# Patient Record
Sex: Female | Born: 1945 | Race: Black or African American | Hispanic: No | Smoking: Former smoker
Health system: Southern US, Community
[De-identification: ages and names within clinical notes are randomized; demographics above are authoritative.]

## PROBLEM LIST (undated history)

## (undated) DIAGNOSIS — J219 Acute bronchiolitis, unspecified: Secondary | ICD-10-CM

## (undated) DIAGNOSIS — K219 Gastro-esophageal reflux disease without esophagitis: Secondary | ICD-10-CM

## (undated) DIAGNOSIS — M1990 Unspecified osteoarthritis, unspecified site: Secondary | ICD-10-CM

## (undated) DIAGNOSIS — F329 Major depressive disorder, single episode, unspecified: Secondary | ICD-10-CM

## (undated) DIAGNOSIS — G8929 Other chronic pain: Secondary | ICD-10-CM

## (undated) DIAGNOSIS — K589 Irritable bowel syndrome without diarrhea: Secondary | ICD-10-CM

## (undated) DIAGNOSIS — E7800 Pure hypercholesterolemia, unspecified: Secondary | ICD-10-CM

## (undated) DIAGNOSIS — F32A Depression, unspecified: Secondary | ICD-10-CM

## (undated) DIAGNOSIS — K429 Umbilical hernia without obstruction or gangrene: Secondary | ICD-10-CM

## (undated) DIAGNOSIS — K635 Polyp of colon: Secondary | ICD-10-CM

## (undated) DIAGNOSIS — Z973 Presence of spectacles and contact lenses: Secondary | ICD-10-CM

## (undated) DIAGNOSIS — M549 Dorsalgia, unspecified: Secondary | ICD-10-CM

## (undated) DIAGNOSIS — K5790 Diverticulosis of intestine, part unspecified, without perforation or abscess without bleeding: Secondary | ICD-10-CM

## (undated) DIAGNOSIS — I10 Essential (primary) hypertension: Secondary | ICD-10-CM

## (undated) DIAGNOSIS — F419 Anxiety disorder, unspecified: Secondary | ICD-10-CM

## (undated) DIAGNOSIS — D126 Benign neoplasm of colon, unspecified: Secondary | ICD-10-CM

## (undated) HISTORY — DX: Anxiety disorder, unspecified: F41.9

## (undated) HISTORY — PX: CATARACT EXTRACTION, BILATERAL: SHX1313

## (undated) HISTORY — DX: Gastro-esophageal reflux disease without esophagitis: K21.9

## (undated) HISTORY — DX: Essential (primary) hypertension: I10

## (undated) HISTORY — DX: Pure hypercholesterolemia, unspecified: E78.00

## (undated) HISTORY — DX: Umbilical hernia without obstruction or gangrene: K42.9

## (undated) HISTORY — DX: Major depressive disorder, single episode, unspecified: F32.9

## (undated) HISTORY — DX: Benign neoplasm of colon, unspecified: D12.6

## (undated) HISTORY — PX: ECTOPIC PREGNANCY SURGERY: SHX613

## (undated) HISTORY — DX: Other chronic pain: G89.29

## (undated) HISTORY — DX: Irritable bowel syndrome, unspecified: K58.9

## (undated) HISTORY — DX: Dorsalgia, unspecified: M54.9

## (undated) HISTORY — DX: Polyp of colon: K63.5

## (undated) HISTORY — DX: Diverticulosis of intestine, part unspecified, without perforation or abscess without bleeding: K57.90

## (undated) HISTORY — DX: Acute bronchiolitis, unspecified: J21.9

## (undated) HISTORY — DX: Depression, unspecified: F32.A

## (undated) HISTORY — PX: APPENDECTOMY: SHX54

---

## 1973-07-08 HISTORY — PX: NEPHRECTOMY: SHX65

## 1978-07-08 HISTORY — PX: CHOLECYSTECTOMY: SHX55

## 1983-07-09 HISTORY — PX: ABDOMINAL HYSTERECTOMY: SHX81

## 1987-07-09 HISTORY — PX: BREAST SURGERY: SHX581

## 2000-12-01 ENCOUNTER — Ambulatory Visit (HOSPITAL_COMMUNITY): Admission: RE | Admit: 2000-12-01 | Discharge: 2000-12-01 | Payer: Self-pay | Admitting: Family Medicine

## 2000-12-01 ENCOUNTER — Encounter: Payer: Self-pay | Admitting: Family Medicine

## 2000-12-29 ENCOUNTER — Ambulatory Visit (HOSPITAL_COMMUNITY): Admission: RE | Admit: 2000-12-29 | Discharge: 2000-12-29 | Payer: Self-pay | Admitting: Unknown Physician Specialty

## 2000-12-29 ENCOUNTER — Encounter: Payer: Self-pay | Admitting: Unknown Physician Specialty

## 2001-01-15 ENCOUNTER — Other Ambulatory Visit: Admission: RE | Admit: 2001-01-15 | Discharge: 2001-01-15 | Payer: Self-pay | Admitting: Family Medicine

## 2001-01-22 ENCOUNTER — Ambulatory Visit (HOSPITAL_COMMUNITY): Admission: RE | Admit: 2001-01-22 | Discharge: 2001-01-22 | Payer: Self-pay | Admitting: Family Medicine

## 2001-01-22 ENCOUNTER — Encounter: Payer: Self-pay | Admitting: Family Medicine

## 2001-08-04 ENCOUNTER — Encounter: Payer: Self-pay | Admitting: Family Medicine

## 2001-08-04 ENCOUNTER — Ambulatory Visit (HOSPITAL_COMMUNITY): Admission: RE | Admit: 2001-08-04 | Discharge: 2001-08-04 | Payer: Self-pay | Admitting: Family Medicine

## 2001-09-02 ENCOUNTER — Encounter: Payer: Self-pay | Admitting: Family Medicine

## 2001-09-02 ENCOUNTER — Ambulatory Visit (HOSPITAL_COMMUNITY): Admission: RE | Admit: 2001-09-02 | Discharge: 2001-09-02 | Payer: Self-pay | Admitting: Family Medicine

## 2002-01-20 ENCOUNTER — Ambulatory Visit (HOSPITAL_COMMUNITY): Admission: RE | Admit: 2002-01-20 | Discharge: 2002-01-20 | Payer: Self-pay | Admitting: Family Medicine

## 2002-01-20 ENCOUNTER — Encounter: Payer: Self-pay | Admitting: Family Medicine

## 2002-01-27 ENCOUNTER — Encounter: Payer: Self-pay | Admitting: Family Medicine

## 2002-01-27 ENCOUNTER — Ambulatory Visit (HOSPITAL_COMMUNITY): Admission: RE | Admit: 2002-01-27 | Discharge: 2002-01-27 | Payer: Self-pay | Admitting: Family Medicine

## 2002-04-16 ENCOUNTER — Ambulatory Visit (HOSPITAL_COMMUNITY): Admission: RE | Admit: 2002-04-16 | Discharge: 2002-04-16 | Payer: Self-pay | Admitting: Obstetrics and Gynecology

## 2002-04-16 ENCOUNTER — Encounter: Payer: Self-pay | Admitting: Obstetrics and Gynecology

## 2002-09-16 ENCOUNTER — Encounter: Payer: Self-pay | Admitting: Family Medicine

## 2002-09-16 ENCOUNTER — Ambulatory Visit (HOSPITAL_COMMUNITY): Admission: RE | Admit: 2002-09-16 | Discharge: 2002-09-16 | Payer: Self-pay | Admitting: Family Medicine

## 2003-06-07 ENCOUNTER — Ambulatory Visit (HOSPITAL_COMMUNITY): Admission: RE | Admit: 2003-06-07 | Discharge: 2003-06-07 | Payer: Self-pay | Admitting: Family Medicine

## 2003-07-05 ENCOUNTER — Ambulatory Visit (HOSPITAL_COMMUNITY): Admission: RE | Admit: 2003-07-05 | Discharge: 2003-07-05 | Payer: Self-pay | Admitting: Family Medicine

## 2004-03-16 ENCOUNTER — Ambulatory Visit (HOSPITAL_COMMUNITY): Admission: RE | Admit: 2004-03-16 | Discharge: 2004-03-16 | Payer: Self-pay | Admitting: Family Medicine

## 2004-08-02 ENCOUNTER — Ambulatory Visit (HOSPITAL_COMMUNITY): Admission: RE | Admit: 2004-08-02 | Discharge: 2004-08-02 | Payer: Self-pay | Admitting: Family Medicine

## 2004-10-23 ENCOUNTER — Ambulatory Visit (HOSPITAL_COMMUNITY): Admission: RE | Admit: 2004-10-23 | Discharge: 2004-10-23 | Payer: Self-pay | Admitting: Neurosurgery

## 2004-11-13 ENCOUNTER — Ambulatory Visit (HOSPITAL_COMMUNITY): Admission: RE | Admit: 2004-11-13 | Discharge: 2004-11-14 | Payer: Self-pay | Admitting: Neurosurgery

## 2004-12-05 ENCOUNTER — Encounter: Admission: RE | Admit: 2004-12-05 | Discharge: 2004-12-05 | Payer: Self-pay | Admitting: Neurosurgery

## 2005-02-28 ENCOUNTER — Ambulatory Visit (HOSPITAL_COMMUNITY): Admission: RE | Admit: 2005-02-28 | Discharge: 2005-02-28 | Payer: Self-pay | Admitting: Family Medicine

## 2005-03-15 ENCOUNTER — Ambulatory Visit: Payer: Self-pay | Admitting: *Deleted

## 2005-03-27 ENCOUNTER — Ambulatory Visit: Payer: Self-pay | Admitting: *Deleted

## 2005-03-27 ENCOUNTER — Encounter (HOSPITAL_COMMUNITY): Admission: RE | Admit: 2005-03-27 | Discharge: 2005-03-27 | Payer: Self-pay | Admitting: *Deleted

## 2005-04-02 ENCOUNTER — Ambulatory Visit: Payer: Self-pay | Admitting: *Deleted

## 2005-06-25 ENCOUNTER — Ambulatory Visit (HOSPITAL_COMMUNITY): Admission: RE | Admit: 2005-06-25 | Discharge: 2005-06-25 | Payer: Self-pay | Admitting: Family Medicine

## 2005-07-03 ENCOUNTER — Ambulatory Visit (HOSPITAL_COMMUNITY): Admission: RE | Admit: 2005-07-03 | Discharge: 2005-07-03 | Payer: Self-pay | Admitting: Family Medicine

## 2005-07-08 HISTORY — PX: CERVICAL DISC SURGERY: SHX588

## 2005-08-05 ENCOUNTER — Ambulatory Visit (HOSPITAL_COMMUNITY): Admission: RE | Admit: 2005-08-05 | Discharge: 2005-08-05 | Payer: Self-pay | Admitting: Internal Medicine

## 2005-08-05 ENCOUNTER — Encounter: Payer: Self-pay | Admitting: Internal Medicine

## 2005-08-05 ENCOUNTER — Ambulatory Visit: Payer: Self-pay | Admitting: Internal Medicine

## 2005-08-05 HISTORY — PX: COLONOSCOPY: SHX174

## 2005-09-09 ENCOUNTER — Ambulatory Visit: Payer: Self-pay | Admitting: Internal Medicine

## 2005-09-12 ENCOUNTER — Ambulatory Visit (HOSPITAL_COMMUNITY): Admission: RE | Admit: 2005-09-12 | Discharge: 2005-09-12 | Payer: Self-pay | Admitting: Internal Medicine

## 2005-10-07 ENCOUNTER — Ambulatory Visit (HOSPITAL_COMMUNITY): Admission: RE | Admit: 2005-10-07 | Discharge: 2005-10-07 | Payer: Self-pay | Admitting: Family Medicine

## 2005-10-29 ENCOUNTER — Ambulatory Visit: Payer: Self-pay | Admitting: *Deleted

## 2005-11-07 ENCOUNTER — Emergency Department (HOSPITAL_COMMUNITY): Admission: EM | Admit: 2005-11-07 | Discharge: 2005-11-07 | Payer: Self-pay | Admitting: Emergency Medicine

## 2005-11-13 ENCOUNTER — Ambulatory Visit: Payer: Self-pay | Admitting: *Deleted

## 2005-11-27 ENCOUNTER — Ambulatory Visit: Payer: Self-pay | Admitting: Internal Medicine

## 2006-01-04 ENCOUNTER — Encounter: Admission: RE | Admit: 2006-01-04 | Discharge: 2006-01-04 | Payer: Self-pay | Admitting: Neurosurgery

## 2006-01-06 ENCOUNTER — Ambulatory Visit: Payer: Self-pay | Admitting: Internal Medicine

## 2006-01-09 ENCOUNTER — Ambulatory Visit: Payer: Self-pay | Admitting: Internal Medicine

## 2006-02-25 ENCOUNTER — Ambulatory Visit: Payer: Self-pay | Admitting: Internal Medicine

## 2006-02-26 ENCOUNTER — Ambulatory Visit (HOSPITAL_COMMUNITY): Admission: RE | Admit: 2006-02-26 | Discharge: 2006-02-26 | Payer: Self-pay | Admitting: Family Medicine

## 2006-03-04 ENCOUNTER — Ambulatory Visit: Payer: Self-pay | Admitting: *Deleted

## 2006-03-06 ENCOUNTER — Emergency Department (HOSPITAL_COMMUNITY): Admission: EM | Admit: 2006-03-06 | Discharge: 2006-03-06 | Payer: Self-pay | Admitting: Emergency Medicine

## 2006-03-27 ENCOUNTER — Ambulatory Visit: Payer: Self-pay | Admitting: Internal Medicine

## 2006-04-10 ENCOUNTER — Ambulatory Visit: Payer: Self-pay | Admitting: Internal Medicine

## 2006-05-09 ENCOUNTER — Ambulatory Visit: Admission: RE | Admit: 2006-05-09 | Discharge: 2006-05-09 | Payer: Self-pay | Admitting: Otolaryngology

## 2006-05-15 ENCOUNTER — Ambulatory Visit: Payer: Self-pay | Admitting: Pulmonary Disease

## 2006-07-22 ENCOUNTER — Ambulatory Visit: Payer: Self-pay | Admitting: Internal Medicine

## 2006-10-02 ENCOUNTER — Ambulatory Visit (HOSPITAL_COMMUNITY): Admission: RE | Admit: 2006-10-02 | Discharge: 2006-10-02 | Payer: Self-pay | Admitting: Family Medicine

## 2006-10-03 ENCOUNTER — Ambulatory Visit: Payer: Self-pay | Admitting: Gastroenterology

## 2006-10-15 ENCOUNTER — Ambulatory Visit (HOSPITAL_COMMUNITY): Admission: RE | Admit: 2006-10-15 | Discharge: 2006-10-15 | Payer: Self-pay | Admitting: Family Medicine

## 2007-03-05 ENCOUNTER — Ambulatory Visit (HOSPITAL_COMMUNITY): Admission: RE | Admit: 2007-03-05 | Discharge: 2007-03-05 | Payer: Self-pay | Admitting: Family Medicine

## 2007-04-09 ENCOUNTER — Ambulatory Visit: Payer: Self-pay | Admitting: Gastroenterology

## 2007-10-22 ENCOUNTER — Ambulatory Visit: Payer: Self-pay | Admitting: Cardiology

## 2007-10-22 ENCOUNTER — Ambulatory Visit (HOSPITAL_COMMUNITY): Admission: RE | Admit: 2007-10-22 | Discharge: 2007-10-22 | Payer: Self-pay | Admitting: Family Medicine

## 2007-10-22 ENCOUNTER — Encounter (INDEPENDENT_AMBULATORY_CARE_PROVIDER_SITE_OTHER): Payer: Self-pay | Admitting: Family Medicine

## 2007-12-08 ENCOUNTER — Telehealth (INDEPENDENT_AMBULATORY_CARE_PROVIDER_SITE_OTHER): Payer: Self-pay | Admitting: *Deleted

## 2007-12-08 ENCOUNTER — Ambulatory Visit: Payer: Self-pay | Admitting: Cardiology

## 2008-04-01 ENCOUNTER — Ambulatory Visit: Payer: Self-pay | Admitting: Internal Medicine

## 2008-05-11 ENCOUNTER — Ambulatory Visit: Payer: Self-pay | Admitting: Internal Medicine

## 2008-05-16 ENCOUNTER — Ambulatory Visit (HOSPITAL_COMMUNITY): Admission: RE | Admit: 2008-05-16 | Discharge: 2008-05-16 | Payer: Self-pay | Admitting: Family Medicine

## 2008-07-12 ENCOUNTER — Ambulatory Visit: Payer: Self-pay | Admitting: Internal Medicine

## 2008-07-14 ENCOUNTER — Encounter: Payer: Self-pay | Admitting: Internal Medicine

## 2008-07-14 LAB — CONVERTED CEMR LAB
ALT: 11 units/L (ref 0–35)
AST: 14 units/L (ref 0–37)
Albumin: 4.4 g/dL (ref 3.5–5.2)
Alkaline Phosphatase: 98 units/L (ref 39–117)
Bilirubin, Direct: 0.2 mg/dL (ref 0.0–0.3)
Creatinine, Ser: 0.96 mg/dL (ref 0.40–1.20)
Indirect Bilirubin: 0.8 mg/dL (ref 0.0–0.9)
Total Bilirubin: 1 mg/dL (ref 0.3–1.2)
Total Protein: 7.7 g/dL (ref 6.0–8.3)

## 2008-07-15 ENCOUNTER — Ambulatory Visit (HOSPITAL_COMMUNITY): Admission: RE | Admit: 2008-07-15 | Discharge: 2008-07-15 | Payer: Self-pay | Admitting: Internal Medicine

## 2008-09-05 ENCOUNTER — Ambulatory Visit (HOSPITAL_COMMUNITY): Admission: RE | Admit: 2008-09-05 | Discharge: 2008-09-05 | Payer: Self-pay | Admitting: Family Medicine

## 2008-10-13 ENCOUNTER — Telehealth: Payer: Self-pay | Admitting: Gastroenterology

## 2008-10-27 DIAGNOSIS — R109 Unspecified abdominal pain: Secondary | ICD-10-CM | POA: Insufficient documentation

## 2008-10-27 DIAGNOSIS — K589 Irritable bowel syndrome without diarrhea: Secondary | ICD-10-CM | POA: Insufficient documentation

## 2008-10-27 DIAGNOSIS — R197 Diarrhea, unspecified: Secondary | ICD-10-CM | POA: Insufficient documentation

## 2008-10-27 DIAGNOSIS — M549 Dorsalgia, unspecified: Secondary | ICD-10-CM | POA: Insufficient documentation

## 2008-10-27 DIAGNOSIS — I10 Essential (primary) hypertension: Secondary | ICD-10-CM | POA: Insufficient documentation

## 2008-10-27 DIAGNOSIS — Z860100 Personal history of colon polyps, unspecified: Secondary | ICD-10-CM | POA: Insufficient documentation

## 2008-10-28 ENCOUNTER — Ambulatory Visit: Payer: Self-pay | Admitting: Internal Medicine

## 2008-10-28 DIAGNOSIS — K219 Gastro-esophageal reflux disease without esophagitis: Secondary | ICD-10-CM | POA: Insufficient documentation

## 2008-10-31 ENCOUNTER — Ambulatory Visit (HOSPITAL_COMMUNITY): Admission: RE | Admit: 2008-10-31 | Discharge: 2008-10-31 | Payer: Self-pay | Admitting: Family Medicine

## 2008-11-01 ENCOUNTER — Telehealth: Payer: Self-pay | Admitting: Gastroenterology

## 2008-11-02 ENCOUNTER — Ambulatory Visit: Payer: Self-pay | Admitting: Internal Medicine

## 2008-11-03 ENCOUNTER — Telehealth: Payer: Self-pay | Admitting: Gastroenterology

## 2008-11-03 ENCOUNTER — Encounter: Payer: Self-pay | Admitting: Internal Medicine

## 2008-11-08 ENCOUNTER — Encounter: Payer: Self-pay | Admitting: Internal Medicine

## 2008-11-08 ENCOUNTER — Ambulatory Visit (HOSPITAL_COMMUNITY): Admission: RE | Admit: 2008-11-08 | Discharge: 2008-11-08 | Payer: Self-pay | Admitting: Internal Medicine

## 2008-11-08 ENCOUNTER — Ambulatory Visit: Payer: Self-pay | Admitting: Internal Medicine

## 2008-11-08 DIAGNOSIS — K635 Polyp of colon: Secondary | ICD-10-CM

## 2008-11-08 HISTORY — PX: COLONOSCOPY: SHX174

## 2008-11-08 HISTORY — DX: Polyp of colon: K63.5

## 2008-11-10 ENCOUNTER — Encounter: Payer: Self-pay | Admitting: Internal Medicine

## 2008-11-15 ENCOUNTER — Encounter (INDEPENDENT_AMBULATORY_CARE_PROVIDER_SITE_OTHER): Payer: Self-pay

## 2008-12-02 ENCOUNTER — Ambulatory Visit (HOSPITAL_COMMUNITY): Admission: RE | Admit: 2008-12-02 | Discharge: 2008-12-02 | Payer: Self-pay | Admitting: Family Medicine

## 2009-03-07 ENCOUNTER — Encounter (INDEPENDENT_AMBULATORY_CARE_PROVIDER_SITE_OTHER): Payer: Self-pay | Admitting: *Deleted

## 2009-03-07 LAB — CONVERTED CEMR LAB
ALT: 13 units/L
AST: 15 units/L
Albumin: 4.5 g/dL
Alkaline Phosphatase: 93 units/L
BUN: 10 mg/dL
CO2: 24 meq/L
Calcium: 9.3 mg/dL
Chloride: 103 meq/L
Cholesterol: 170 mg/dL
Creatinine, Ser: 0.92 mg/dL
Glucose, Bld: 101 mg/dL
HDL: 72 mg/dL
LDL Cholesterol: 78 mg/dL
Potassium: 3.9 meq/L
Sodium: 139 meq/L
Total Protein: 7.9 g/dL
Triglycerides: 98 mg/dL

## 2009-04-20 ENCOUNTER — Ambulatory Visit (HOSPITAL_COMMUNITY): Admission: RE | Admit: 2009-04-20 | Discharge: 2009-04-20 | Payer: Self-pay | Admitting: Family Medicine

## 2009-05-10 ENCOUNTER — Encounter: Payer: Self-pay | Admitting: Internal Medicine

## 2009-08-07 ENCOUNTER — Emergency Department (HOSPITAL_COMMUNITY): Admission: EM | Admit: 2009-08-07 | Discharge: 2009-08-07 | Payer: Self-pay | Admitting: Emergency Medicine

## 2009-08-10 ENCOUNTER — Encounter: Payer: Self-pay | Admitting: Cardiovascular Disease

## 2009-08-25 ENCOUNTER — Ambulatory Visit: Payer: Self-pay | Admitting: Cardiovascular Disease

## 2009-08-25 DIAGNOSIS — R609 Edema, unspecified: Secondary | ICD-10-CM | POA: Insufficient documentation

## 2009-08-25 DIAGNOSIS — R0789 Other chest pain: Secondary | ICD-10-CM | POA: Insufficient documentation

## 2009-09-01 ENCOUNTER — Encounter (INDEPENDENT_AMBULATORY_CARE_PROVIDER_SITE_OTHER): Payer: Self-pay | Admitting: *Deleted

## 2009-10-18 ENCOUNTER — Ambulatory Visit (HOSPITAL_COMMUNITY): Admission: RE | Admit: 2009-10-18 | Discharge: 2009-10-18 | Payer: Self-pay | Admitting: Family Medicine

## 2009-12-01 ENCOUNTER — Telehealth (INDEPENDENT_AMBULATORY_CARE_PROVIDER_SITE_OTHER): Payer: Self-pay

## 2010-03-15 ENCOUNTER — Ambulatory Visit: Payer: Self-pay | Admitting: Internal Medicine

## 2010-03-15 DIAGNOSIS — R1032 Left lower quadrant pain: Secondary | ICD-10-CM | POA: Insufficient documentation

## 2010-03-29 ENCOUNTER — Telehealth (INDEPENDENT_AMBULATORY_CARE_PROVIDER_SITE_OTHER): Payer: Self-pay

## 2010-04-02 ENCOUNTER — Encounter: Payer: Self-pay | Admitting: Gastroenterology

## 2010-04-04 ENCOUNTER — Encounter: Payer: Self-pay | Admitting: Internal Medicine

## 2010-04-04 ENCOUNTER — Encounter: Payer: Self-pay | Admitting: Gastroenterology

## 2010-04-04 LAB — CONVERTED CEMR LAB
Creatinine, Ser: 0.96 mg/dL (ref 0.40–1.20)
Helicobacter Pylori Antibody-IgG: 0.9

## 2010-04-05 ENCOUNTER — Ambulatory Visit (HOSPITAL_COMMUNITY): Admission: RE | Admit: 2010-04-05 | Discharge: 2010-04-05 | Payer: Self-pay | Admitting: Internal Medicine

## 2010-04-06 ENCOUNTER — Encounter: Payer: Self-pay | Admitting: Gastroenterology

## 2010-04-11 LAB — CONVERTED CEMR LAB
ALT: 15 units/L (ref 0–35)
AST: 20 units/L (ref 0–37)
Albumin: 4.3 g/dL (ref 3.5–5.2)
Alkaline Phosphatase: 75 units/L (ref 39–117)
Bilirubin, Direct: 0.1 mg/dL (ref 0.0–0.3)
Indirect Bilirubin: 0.5 mg/dL (ref 0.0–0.9)
Total Bilirubin: 0.6 mg/dL (ref 0.3–1.2)
Total Protein: 7.4 g/dL (ref 6.0–8.3)

## 2010-04-23 ENCOUNTER — Telehealth (INDEPENDENT_AMBULATORY_CARE_PROVIDER_SITE_OTHER): Payer: Self-pay

## 2010-05-02 ENCOUNTER — Ambulatory Visit: Payer: Self-pay | Admitting: Internal Medicine

## 2010-05-02 LAB — CONVERTED CEMR LAB: Helicobacter Pylori Antibody-IgG: 1 — ABNORMAL HIGH

## 2010-05-10 ENCOUNTER — Encounter: Payer: Self-pay | Admitting: Internal Medicine

## 2010-06-12 ENCOUNTER — Encounter: Payer: Self-pay | Admitting: Internal Medicine

## 2010-08-07 NOTE — Progress Notes (Signed)
Summary: phone note/ PR/ doing better  Phone Note Call from Patient   Caller: Patient Summary of Call: Pt called with PR. Said her abd pain and constipation is much better. She is taking the Ducolax and her stools are much more normal. She continues to have some abdominal twinges. She said she is seeing Dr. Ladona Ridgel, chiropractor and doing much better,  but he said he was not sure, that some of her problem still could be coming from her gut. She also asked could she be checked  for H. Pylori, said her daughter mentioned that to her. Initial call taken by: Cloria Spring LPN,  March 29, 2010 3:11 PM     Appended Document: phone note/ PR/ doing better Continue Dulcolax Balance. Check H.Pylori serologies, Dx abd pain OV with RMR 3 months.  Appended Document: phone note/ PR/ doing better called pt- lab order faxed to lab, pt will try to go by today and have that done. She also stated she was doing good untill yesterday when she started having abd pain and watery diarrhea again. No N/V, no fever, no blood in stool. pt wants to know if there is anything else she needs to be doing. please advise. pts cell number is [phone]  Appended Document: phone note/ PR/ doing better Hold dulcolax balance for diarrhea. Await labs. Keep stool diary for next two weeks to include notations of any abd pain. May ultimately need CT but will monitor over next couple of days,etc. Will touch base after labs done.  Appended Document: phone note/ PR/ doing better pt aware, she had lab done today

## 2010-08-07 NOTE — Progress Notes (Signed)
  Phone Note From Other Clinic   Summary of Call: I had requested last labs,cbc,hemoglobin and tsh from Dr. Adaline Sill office, but they said the last labs were from 10/09. Do you still want these? I asked if they had the last CBC, Hemoglobin and TSH, but his office said they didnt have any of those to send. Initial call taken by: Diana Eves,  November 01, 2008 3:35 PM      Appended Document:  Only intersted in TSH, CBC if available.  Otherwise, no need to send last labs.  Thx.

## 2010-08-07 NOTE — Miscellaneous (Signed)
Summary: labs cmp,lipids, [facility] 03/07/2009  Clinical Lists Changes  Observations: Added new observation of CALCIUM: 9.3 mg/dL (09/81/1914 7:82) Added new observation of ALBUMIN: 4.5 g/dL (95/62/1308 6:57) Added new observation of PROTEIN, TOT: 7.9 g/dL (84/69/6295 2:84) Added new observation of SGPT (ALT): 13 units/L (03/07/2009 9:03) Added new observation of SGOT (AST): 15 units/L (03/07/2009 9:03) Added new observation of ALK PHOS: 93 units/L (03/07/2009 9:03) Added new observation of CREATININE: 0.92 mg/dL (13/24/4010 2:72) Added new observation of BUN: 10 mg/dL (53/66/4403 4:74) Added new observation of BG RANDOM: 101 mg/dL (25/95/6387 5:64) Added new observation of CO2 PLSM/SER: 24 meq/L (03/07/2009 9:03) Added new observation of CL SERUM: 103 meq/L (03/07/2009 9:03) Added new observation of K SERUM: 3.9 meq/L (03/07/2009 9:03) Added new observation of NA: 139 meq/L (03/07/2009 9:03) Added new observation of LDL: 78 mg/dL (33/29/5188 4:16) Added new observation of HDL: 72 mg/dL (60/63/0160 1:09) Added new observation of TRIGLYC TOT: 98 mg/dL (32/35/5732 2:02) Added new observation of CHOLESTEROL: 170 mg/dL (54/27/0623 7:62)

## 2010-08-07 NOTE — Miscellaneous (Signed)
Summary: Orders Update  Clinical Lists Changes  Orders: Added new Test order of T-Hepatic Function ([id number]) - Signed 

## 2010-08-07 NOTE — Miscellaneous (Signed)
Summary: Orders Update  Clinical Lists Changes  Orders: Added new Test order of T-Helicobacter AB - IgG ([id number]) - Signed 

## 2010-08-07 NOTE — Assessment & Plan Note (Signed)
Summary: abd pain,constipation.ss   Visit Type:  f/u Primary Care Provider:  Dr. Mirna Mires  Chief Complaint:  abd pain and constipation.  History of Present Illness: [name] is a pleasant 65 y/o female who presents with several week h/o abd pain and constipation.   Some pain in side related to constipation for past several weeks. Takes hydrocodone couple of times per day for low back pain. Left flank pain to LLQ. Symptoms started within last few weeks. Before was diarrhea. BM now consist of hard balls. Trigger foods for diarrhea not making BMs happen like usualy. Currently on Colace two per day. Miralax one tbsp daily for two days, quit after no results. No melena, brbpr. No fever or chills. No urinary symptoms.   Current Medications (verified): 1)  Klor-Con M20 20 Meq Cr-Tabs (Potassium Chloride Crys Cr) .... One By Mouth Daily 2)  Furosemide 20 Mg Tabs (Furosemide) .... One By Mouth Daily 3)  Vicodin 5-500 Mg Tabs (Hydrocodone-Acetaminophen) .... One By Mouth Two Times A Day Prn 4)  Alprazolam 0.5 Mg Tabs (Alprazolam) .... Prn 5)  Vivelle 0.05 Mg/24hr Pttw (Estradiol) .... As Directed 6)  Crestor .... One By Mouth Daily 7)  Enforge 10/160mg  .... One By Mouth Daily 8)  Prilosec 20 Mg Cpdr (Omeprazole) .... Once Daily 9)  Amrix 15 Mg Xr24h-Cap (Cyclobenzaprine Hcl) 10)  Gabapentin 100 Mg Caps (Gabapentin) .... At Bedtime 11)  Volteran Gel .... As Needed  Allergies (verified): No Known Drug Allergies  Past History:  Past Medical History: Current Problems:  GERD (ICD-530.81) HYPERTENSION (ICD-401.9) BACK PAIN, CHRONIC (ICD-724.5) IRRITABLE BOWEL SYNDROME (ICD-564.1) ABDOMINAL CRAMPS (ICD-789.00) COLONIC POLYPS, HX OF (ICD-V12.72), adenomas and tubulovillous adenomas DIARRHEA (ICD-787.91) TCS 5/10, Dr. Rourk--> Minimal internal hemorrhoids, otherwise normal rectum. Pancolonic diverticula. Diminutive mid sigmoid polyp (hyperplastic). Status post segmental biopsies of  descending segment and status post stool sampling. All unremarkable. Next surveillance TCS due 11/2013.   Past Surgical History: Reviewed history from 10/27/2008 and no changes required. CHOLECYSTECTOMY LEFT NEPHRECTOMY TUBAL PREGNANCY TWICE AND DILATION AND CURETTAGE APPENDECTOMY HYSTERECTOMY  Review of Systems      See HPI  Vital Signs:  Patient profile:   65 year old female Height:      67 inches Weight:      200 pounds BMI:     31.44 Temp:     97.9 degrees F oral Pulse rate:   64 / minute BP sitting:   110 / 80  (left arm) Cuff size:   regular  Vitals Entered By: Hendricks Limes LPN (March 15, 2010 11:41 AM)  Physical Exam  General:  Well developed, well nourished, no acute distress. Head:  Normocephalic and atraumatic. Eyes:  sclera nonicteric Mouth:  op moist Lungs:  Clear throughout to auscultation. Heart:  Regular rate and rhythm; no murmurs, rubs,  or bruits. Abdomen:  Soft. ND. NT. No CVA tenderness. No rash. No HSM or masses. No abd bruit or hernia. Extremities:  No clubbing, cyanosis, edema or deformities noted. Neurologic:  Alert and  oriented x4;  grossly normal neurologically. Skin:  Intact without significant lesions or rashes. Cervical Nodes:  No significant cervical adenopathy. Psych:  Alert and cooperative. Normal mood and affect.  Impression & Recommendations:  Problem # 1:  IRRITABLE BOWEL SYNDROME (ICD-564.1)  Suspect abd discomfort secondary to constipation. No significant tenderness on exam. Doubt diverticulitis. She has h/o IBS ususally diarrhea-predominant. TCS last year reassuring. She will take Miralax or Dulcolax Balance 17g two times a day for two  days and then daily. Call with PR in 2 weeks. If BMs regular and abd pain persistent, then consider abd imaging. She is in agreement with this plan.   Orders: Est. Patient Level II (62831)

## 2010-08-07 NOTE — Progress Notes (Signed)
  Phone Note Call from Patient   Caller: Patient Summary of Call: Pt is having some IBS diarrhea episodes. She spoke with someone who used Cholestyramine and would like to know if that would be appropriate for her to use. She uses [facility]. She is aware that I have no one in the office today to address this, and she will not hear back before tomorrow. Initial call taken by: Cloria Spring LPN,  October 13, 2008 1:39 PM      Appended Document:  Rx - cholestyramine  Spoke with patient.  She is having diarrhea 3-4 days a week.  Rarely has constipation.  On Hyomax .375mg  by mouth two times a day and .125mg  SL two times a day with no relief.  Advised trial of cholestyramine.  Advised of risk of constipation and need to hold if no bm in 24 hours period of time.  She will stop Hyomax.375mg  but may use SL form only occasionally for cramps.  Prescriptions: CHOLESTYRAMINE 4 GM/DOSE POWD (CHOLESTYRAMINE) Take 2 grams by mouth daily as needed diarrhea.  Do not take within 4 hours of other medications.  Hold for constipation.  #30 days x 0   Entered and Authorized by:   Leanna Battles. Dixon Boos   Signed by:   Leanna Battles Yailene Badia PA-C on 10/13/2008   Method used:   Electronically to        [id number] (retail)       [address]       [place], [place]  [address]       Ph: [phone] or [phone]       Fax: [phone]   RxID:   [id number]

## 2010-08-07 NOTE — Letter (Signed)
Summary: DR Derrell Lolling REFERRAL  DR Derrell Lolling REFERRAL   Imported By: Ave Filter 05/10/2010 14:42:57  _____________________________________________________________________  External Attachment:    Type:   Image     Comment:   External Document  Appended Document: DR Derrell Lolling REFERRAL Pt scheduled to see Dr Derrell Lolling 06/06/10@3 :00PM

## 2010-08-07 NOTE — Progress Notes (Signed)
Summary: pft's  Phone Note From Other Clinic Call back at [phone]   Caller: [facility] Call For: wert Summary of Call: Need copy of pft's from 2007 faxed to [phone].  Dr. Dietrich Pates wants them. chart requested Initial call taken by: Eugene Gavia,  December 08, 2007 3:34 PM  Follow-up for Phone Call        awaiting chart to see if pft's are in the chart. Marijo File CMA  December 08, 2007 4:07 PM   Additional Follow-up for Phone Call Additional follow up Details #1::        done. pft's faxed to # provided. Additional Follow-up by: Cyndia Diver LPN,  December 07, 4780 4:39 PM

## 2010-08-07 NOTE — Assessment & Plan Note (Signed)
Summary: HEMECULT CARDS/AMS   Allergies: No Known Drug Allergies  Other Orders: Immuno-chemical Fecal Occult ([id number])   pt returned iFOB- results are positive  Appended Document: HEMECULT CARDS/AMS Discussed with RMR.  Patient needs TCS for +ifobt.  Please arrange.  Appended Document: HEMECULT CARDS/AMS Pt is scheduled for TCS on 11/08/08 @10 :45am

## 2010-08-07 NOTE — Assessment & Plan Note (Signed)
Summary: f/u on acid reflux/MM   Visit Type:  Follow-up Visit Primary Care Provider:  Mirna Mires, MD  Chief Complaint:  diarrhea.  History of Present Illness: Patient is pleasant 65 y/o AA female with h/o IBS, diarrhea predominant.  She was last seen in 1/10 by Dr. Jena Gauss.  She called recently and wanted to try Questran.  She took one dose, which worked, but she says she cannot tolerate the taste.  She is taking Hyomax .375mg  two times a day and .125mg  SL 1-2 times daily as needed.  She notes diarrhea with certain foods like corn, chocolate.  Stress makes it worse as well.  She has associated abdominal crampy.  She is on amoxicillin for 5 days after oral surgery and notes her stomach has hurt a little more.  Denies melena, BRBPR.  On bad days she may have 5 stools/day.  But she does have occasional days with no bms.  She has diarrhea about 2-3 days per week and denies nocturnal symptoms.  Last TCS 2007 she had benign colonic ulcer  and diverticulosis.  She does have h/o colonic polyps and next TCS due in 2012.  GERD well-controlled.  Switched to omeprazole two months ago because of expense on Nexium.      Current Medications (verified): 1)  Klor-Con M20 20 Meq Cr-Tabs (Potassium Chloride Crys Cr) .... One By Mouth Daily 2)  Furosemide 20 Mg Tabs (Furosemide) .... One By Mouth Daily 3)  Vicodin 5-500 Mg Tabs (Hydrocodone-Acetaminophen) .... One By Mouth Two Times A Day Prn 4)  Alprazolam 0.5 Mg Tabs (Alprazolam) .... Prn 5)  Hyomax-Sl 0.125 Mg Subl (Hyoscyamine Sulfate) .... One By Mouth Two Times A Day Prn 6)  Vivelle 0.05 Mg/24hr Pttw (Estradiol) .... As Directed 7)  Crestor .... One By Mouth Daily 8)  Enforge 10/160mg  .... One By Mouth Daily 9)  Cholestyramine 4 Gm/dose Powd (Cholestyramine) .... Take 2 Grams By Mouth Daily As Needed Diarrhea.  Do Not Take Within 4 Hours of Other Medications.  Hold For Constipation. 10)  Prilosec 20 Mg Cpdr (Omeprazole) .... Once Daily 11)  Hyomax-Sr 0.375  Mg Xr12h-Tab (Hyoscyamine Sulfate) .... One By Mouth Bid  Allergies (verified): No Known Drug Allergies  Review of Systems General:  Denies fever, anorexia, and weight loss. CV:  Denies chest pains and peripheral edema. Resp:  Denies dyspnea at rest, dyspnea with exercise, and cough. GI:  See HPI. GU:  Denies urinary burning and blood in urine. MS:  Complains of low back pain. Derm:  Denies rash. Endo:  Denies unusual weight change. Heme:  Denies bruising and bleeding.  Vital Signs:  Patient profile:   65 year old female Height:      67 inches Weight:      202 pounds BMI:     31.75 Temp:     97.7 degrees F oral Pulse rate:   76 / minute BP sitting:   130 / 80  (left arm) Cuff size:   regular  Vitals Entered By: Hendricks Limes LPN (October 28, 2008 11:39 AM)  Physical Exam  General:  Well developed, well nourished, no acute distress. Head:  Normocephalic and atraumatic. Eyes:  Conjunctivae pink, no scleral icterus.  Mouth:  Oropharyngeal mucosa moist, pink.  No lesions, erythema or exudate.    Neck:  Supple; no masses or thyromegaly. Lungs:  Clear throughout to auscultation. Heart:  Regular rate and rhythm; no murmurs, rubs,  or bruits. Abdomen:  Bowel sounds normal.  Abdomen is soft, nontender, nondistended.  No rebound or guarding.  No hepatosplenomegaly, masses or hernias.  No abdominal bruits.  Extremities:  No clubbing, cyanosis, edema or deformities noted. Neurologic:  Alert and  oriented x4;  grossly normal neurologically. Skin:  Intact without significant lesions or rashes. Psych:  Alert and cooperative. Normal mood and affect.  Impression & Recommendations:  Problem # 1:  IRRITABLE BOWEL SYNDROME (ICD-564.1)  Chronic intermittent abdominal cramping and diarrhea most c/w IBS.  Continue hyomax .375mg  two times a day but only use SL form if breakthrough symptoms.  Add Align one by mouth daily for four weeks #30 samples provided.  Will check iFOBT.  If positive,  consider TCS now.  Otherwise, will discuss with Dr. Jena Gauss.  IBS literature given.  High Fiber diet.  Will attempt to obtain most recent labs from Dr. Earvin Hansen Hill's office specifically looking for TSH, Hgb.  Orders: Est. Patient Level II (04540)  Problem # 2:  GERD (ICD-530.81)  Well-controlled on omeprazole.  Orders: Est. Patient Level II (98119)  Problem # 3:  COLONIC POLYPS, HX OF (ICD-V12.72) Due for surveillance TCS in 2012. Orders: Est. Patient Level II ([id number])  Medications Added to Medication List This Visit: 1)  Prilosec 20 Mg Cpdr (Omeprazole) .... Once daily 2)  Hyomax-sr 0.375 Mg Xr12h-tab (Hyoscyamine sulfate) .... One by mouth bid 3)  Align Caps (Misc intestinal flora regulat) .... One by mouth daily for 4 weeks  Patient Instructions: 1)  IBS brochure given.  2)  High Fiber diet handout given.  3)  The medication list was reviewed and reconciled.  All changed / newly prescribed medications were explained.  A complete medication list was provided to the patient / caregiver.

## 2010-08-07 NOTE — Progress Notes (Signed)
Summary: phone message  Phone Note Call from Patient   Caller: Patient Summary of Call: Pt is having diarrhea x 4 today, alot of mucous. Having abdominal pain also. No blood in diarrhea. Had episode Wed and nearly drew over. Said she took some Pepto a little while ago. Wants to know what she should do. Is taking the probiotic and and Hyomax. She said her PCP, Dr. Loleta Chance is open on Sat.'s and she will try to see him tomorrow. I told her  I have no one here now to address, but to go to the ER if abd pain worsens.   Initial call taken by: Cloria Spring LPN,  Dec 01, 2009 4:50 PM

## 2010-08-07 NOTE — Letter (Signed)
Summary: PROGRESS NOTE  PROGRESS NOTE   Imported By: Faythe Ghee 08/29/2009 13:55:09  _____________________________________________________________________  External Attachment:    Type:   Image     Comment:   External Document

## 2010-08-07 NOTE — Progress Notes (Signed)
Summary: lab order to [facility] for repeat H' Pylori  Phone Note Call from Patient   Caller: Patient Summary of Call: Pt misplaced lab order for the repeat H.Pylori. I faxed order to [facility]. Pt aware. Initial call taken by: Cloria Spring LPN,  April 23, 2010 3:35 PM

## 2010-08-07 NOTE — Assessment & Plan Note (Signed)
Summary: fu ov with RMR,abd pain,biliary dilation/ss   Visit Type:  Follow-up Visit Primary Care Provider:  Mirna Mires  Chief Complaint:  F/U abd pain/ biliary dilation.  History of Present Illness: 65 year old lady with intermittent left flank, left lower quadrant/ left inguinal pain with having a bowel movement. Has been intermittently constipated but has had some diarrhea as well. No blood per rectum. Takes Dulcolax balance/MiraLax p.r.n. constipation. She states the pain has a positional component. If she sits or lies back in a chair she could hardly stand it -  has severe left flank pain radiating to her left groin.  CT demonstrated some increasing intrahepatic biliary dilation. Small umbilical hernia and a left nephrectomy incisional hernia containing fat.  H. pylorus serologies came back equivocal x2.; stool antigen testing pending.     Current Medications (verified): 1)  Klor-Con M20 20 Meq Cr-Tabs (Potassium Chloride Crys Cr) .... One By Mouth Daily 2)  Furosemide 20 Mg Tabs (Furosemide) .... One By Mouth Daily 3)  Vicodin 5-500 Mg Tabs (Hydrocodone-Acetaminophen) .... One By Mouth Two Times A Day Prn 4)  Alprazolam 0.5 Mg Tabs (Alprazolam) .... Prn 5)  Vivelle 0.05 Mg/24hr Pttw (Estradiol) .... As Directed 6)  Crestor .... One By Mouth Daily 7)  Enforge 10/160mg  .... One By Mouth Daily 8)  Prilosec 20 Mg Cpdr (Omeprazole) .... Once Daily 9)  Volteran Gel .... As Needed 10)  Hyomax-Dt 0.375 Mg Cr-Tabs (Hyoscyamine Sulfate) .... As Needed  Allergies (verified): No Known Drug Allergies  Past History:  Past Medical History: Last updated: 03/15/2010 Current Problems:  GERD (ICD-530.81) HYPERTENSION (ICD-401.9) BACK PAIN, CHRONIC (ICD-724.5) IRRITABLE BOWEL SYNDROME (ICD-564.1) ABDOMINAL CRAMPS (ICD-789.00) COLONIC POLYPS, HX OF (ICD-V12.72), adenomas and tubulovillous adenomas DIARRHEA (ICD-787.91) TCS 5/10, Dr. Cheyne Boulden--> Minimal internal hemorrhoids, otherwise normal  rectum. Pancolonic diverticula. Diminutive mid sigmoid polyp (hyperplastic). Status post segmental biopsies of descending segment and status post stool sampling. All unremarkable. Next surveillance TCS due 11/2013.   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 Abdomen:  obese positive bowel sounds soft nontender; marble-sized umbilical hernia is reducible; no masses or organomegaly  left flank exam reveals a nephrectomy scar with some retraction; she does have some tenderness in the area  Impression & Recommendations: Impression:65 year old lady with left flank/ left lower quadrant /left inguinal pain that appears to have a positional component. Findings on CT noted.   I doubt the umbilical hernia or biliary dilation is clinically significant at this time. I am more impressed with the possibility of the  incisional hernia, at the nephrectomy site containing fat, maybe more the cause of her left-sided abdominal pain than anything else. HP serologies equivocal. We will followup on the stool antigen test. Again, a potential HP infection not likely to have anything to do with her ongoing pain symptoms.  Otherwise, her workup to  date is reassuring otherwise.  Recommendations: We'll discuss case with Dr. Loleta Chance.   Would like to have her seen by one of the surgeons at [facility]. Further recommendations in the very near future.  She is to continue  utilizing MiraLax nightly p.r.n. no bowel movement on any given day.  Other Orders: Est. Patient Level IV (29562)  Appended Document: fu ov with RMR,abd pain,biliary dilation/ss finally reached Dr. Loleta Chance; he is OK w surgical referal to Dr. Aura Camps group regarding possible symptomatic nephrectomy scar hernia; please refer.  Appended Document: fu ov with RMR,abd pain,biliary dilation/ss Referral faxed to Dr Jacinto Halim office..I called pt to tell her about referral,no answer, lm with husban do to give our office a call.

## 2010-08-07 NOTE — Letter (Signed)
Summary: CT SCAN ABD/PELVIS ORDER  CT SCAN ABD/PELVIS ORDER   Imported By: Ave Filter 04/04/2010 14:34:16  _____________________________________________________________________  External Attachment:    Type:   Image     Comment:   External Document

## 2010-08-07 NOTE — Miscellaneous (Signed)
Summary: Orders Update  Clinical Lists Changes  Orders: Added new Test order of T-Helicobactor Pylori Antigen Stool ([id number]) - Signed 

## 2010-08-07 NOTE — Letter (Signed)
Summary: CLINIC NOTE FROM DR [facility] NOTE FROM DR Derrell Lolling   Imported By: Rexene Alberts 06/12/2010 15:50:39  _____________________________________________________________________  External Attachment:    Type:   Image     Comment:   External Document

## 2010-08-07 NOTE — Assessment & Plan Note (Signed)
Summary: ***NP3 CHEST PAIN   Primary Provider:  Mirna Mires, MD   History of Present Illness: [name] is seen today at the request of [facility].  She has previously been seen by Dr Dietrich Pates and Dorethea Clan.  She has had a normal echo and myovue I believe in 2009.  She had a bad episode of SSCP in January.She was seen in the ER at [facility] and R/O with no acute ECG changes.  She has not had any recurrence.  Her BP was elevated and she is on chronic Rx for this.  She is mostly compliant with her meds.  She has some chronic neck and shoulder pain with previous anterior cervical fusion.  I dont think this is related to her heart.  She denies palpitations, dyspnea, she has mild dependant edema.   Current Problems (verified): 1)  Gerd  (ICD-530.81) 2)  Hypertension  (ICD-401.9) 3)  Back Pain, Chronic  (ICD-724.5) 4)  Irritable Bowel Syndrome  (ICD-564.1) 5)  Abdominal Cramps  (ICD-789.00) 6)  Colonic Polyps, Hx of  (ICD-V12.72) 7)  Diarrhea  (ICD-787.91)  Current Medications (verified): 1)  Klor-Con M20 20 Meq Cr-Tabs (Potassium Chloride Crys Cr) .... One By Mouth Daily 2)  Furosemide 20 Mg Tabs (Furosemide) .... One By Mouth Daily 3)  Vicodin 5-500 Mg Tabs (Hydrocodone-Acetaminophen) .... One By Mouth Two Times A Day Prn 4)  Alprazolam 0.5 Mg Tabs (Alprazolam) .... Prn 5)  Hyomax-Sl 0.125 Mg Subl (Hyoscyamine Sulfate) .... One By Mouth Two Times A Day Prn 6)  Vivelle 0.05 Mg/24hr Pttw (Estradiol) .... As Directed 7)  Crestor .... One By Mouth Daily 8)  Enforge 10/160mg  .... One By Mouth Daily 9)  Cholestyramine 4 Gm/dose Powd (Cholestyramine) .... Take 2 Grams By Mouth Daily As Needed Diarrhea.  Do Not Take Within 4 Hours of Other Medications.  Hold For Constipation. 10)  Prilosec 20 Mg Cpdr (Omeprazole) .... Once Daily 11)  Hyomax-Sr 0.375 Mg Xr12h-Tab (Hyoscyamine Sulfate) .... One By Mouth Bid 12)  Align  Caps (Misc Intestinal Flora Regulat) .... One By Mouth Daily For 4 Weeks 13)  Amrix  15 Mg Xr24h-Cap (Cyclobenzaprine Hcl) 14)  Mobic 7.5 Mg Tabs (Meloxicam) .... As Needed  Allergies (verified): No Known Drug Allergies  Past History:  Past Medical History: Last updated: 08/25/2009 Current Problems:  GERD (ICD-530.81) HYPERTENSION (ICD-401.9) BACK PAIN, CHRONIC (ICD-724.5) IRRITABLE BOWEL SYNDROME (ICD-564.1) ABDOMINAL CRAMPS (ICD-789.00) COLONIC POLYPS, HX OF (ICD-V12.72) DIARRHEA (ICD-787.91)  Past Surgical History: Last updated: 10/27/2008 CHOLECYSTECTOMY LEFT NEPHRECTOMY TUBAL PREGNANCY TWICE AND DILATION AND CURETTAGE APPENDECTOMY HYSTERECTOMY  Family History: Last updated: 08/25/2009 non-contributory  Social History: Last updated: 08/25/2009 married One older son living in [place] Non-smoker Non-drinker 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  Family History: non-contributory  Social History: married One older son living in [place] Non-smoker Non-drinker 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  No HSM or HJR Distal pulses intact with no bruits No edema Neuro  non-focal Skin warm and dry S/O left nephrectomy S/P choly S/P appy S/P anterior cervical fusion   Impression & Recommendations:  Problem # 1:  HYPERTENSION (ICD-401.9) Well controlled continue diuretic and exforge Her updated medication list for this problem includes:    Furosemide 20 Mg Tabs (Furosemide) ..... One by mouth daily  Problem # 2:  CHEST PAIN UNSPECIFIED  (ICD-786.50) Non-recurrent with normal studies in 2009.  Observe and F/U stress test only if recurrent symptoms  Problem # 3:  EDEMA (ICD-782.3) Dependant from obesity.  Continue current dose of diuretic.  Elevate legs at end of day  Patient Instructions: 1)  Your physician recommends that you schedule a follow-up appointment in: 12 months 2)  Your physician recommends that you continue on your current medications as directed. Please refer to the Current Medication list given to you today.   Echocardiogram Report  Procedure date:  08/07/2009  Findings:      NSR 77 LAE ICRBBB LVH No significatn change from last tracing

## 2010-08-07 NOTE — Miscellaneous (Signed)
Summary: stool studies and CBC from [facility]   Clinical Lists Changes STOOL CULTURE  Culture, Stool(Salm/Shig/Campy&ECO157) - STATUS: Final  SEE NOTE.                                 Perform Date: 4 May10 12:10  Ordered By: Jena Gauss MD , Gerrit Friends           Ordered Date: 4 May10 12:10  Facility: [facility]                               Department: MICR  Service Report Text     SPECIMEN OBTAINED:            11/08/2008 12:10  SPECIMEN DESCRIPTION:         STOOL                                ENDO SPECIMEN  SPECIAL REQUESTS:             NONE  CULTURE:                      NO SALMONELLA, SHIGELLA, CAMPYLOBACTER, OR                                YERSINIA ISOLATED                                Performed at [facility] [address]  REPORT STATUS:                FINAL                                11/12/2008  Additional Information  HL7 RESULT STATUS : F  External IF Update Timestamp : 2008-11-12:12:37:00.000000  O&P  Ova and Parasite Exam - STATUS: Final  SEE NOTE.                                 Perform Date: 4 May10 12:10  Ordered By: Jena Gauss MD , Gerrit Friends           Ordered Date: 4 May10 12:10  Facility: [facility]                               Department: MICR  Service Report Text     SPECIMEN OBTAINED:            11/08/2008 12:10  SPECIMEN DESCRIPTION:         STOOL                                ENDO SPECIMEN  SPECIAL REQUESTS:             NONE  OVA AND PARASITES:            NO OVA OR PARASITES SEEN  REPORT STATUS:                FINAL  11/09/2008  Additional Information  HL7 RESULT STATUS : F  External IF Update Timestamp : 2008-11-09:16:34:00.000000   C-DIFF  Clostridium Difficile Tox A/B (EIA)-Fecl - STATUS: Final  SEE NOTE.                                 Perform Date: 4 May10 12:10  Ordered By: Jena Gauss MD , Gerrit Friends           Ordered Date: 4 May10 12:10  Facility: [facility]                               Department: MICR  Service Report Text     SPECIMEN  OBTAINED:            11/08/2008 12:10  SPECIMEN DESCRIPTION:         STOOL                                ENDO SPECIMEN  SPECIAL REQUESTS:             NONE  C DIFF TOXIN A and B:         NEGATIVE  REPORT STATUS:                FINAL                                11/09/2008  Additional Information  HL7 RESULT STATUS : F  External IF Update Timestamp : 2008-11-09:05:45:00.000000   LACTOFERRIN   Fecal-Lactoferrin - STATUS: Final  SEE NOTE.                                 Perform Date: 4 May10 12:10  Ordered By: Jena Gauss MD , Gerrit Friends           Ordered Date: 4 May10 12:11  Facility: [facility]                               Department: MICR  Service Report Text     SPECIMEN OBTAINED:            11/08/2008 12:10  SPECIMEN DESCRIPTION:         STOOL                                ENDO SPECIMEN  SPECIAL REQUESTS:             NONE  FECAL LACTOFERRIN:            POSITIVE  REPORT STATUS:                FINAL                                11/09/2008  Additional Information  HL7 RESULT STATUS : F  External IF Update Timestamp : 2008-11-09:02:19:00.000000    CBC  L-CBC-with Differential - STATUS: Final  Perform Date: 4 May10 12:00  Ordered By: Jena Gauss MD , Gerrit Friends           Ordered Date: 4 May10 12:11                                       Last Updated Date: 4 May10 12:21  Facility: [facility]                               Department: GENL  Accession #: [id number]                   USN:       [id number]  Findings  Result Name                              Result     Abnl   Normal Range     Units      Perf. Loc.  WBC                                      4.4               4.0-10.5         K/uL  RBC                                      4.20              3.87-5.11        MIL/uL  Hemoglobin (HGB)                         12.7              12.0-15.0        g/dL  Hematocrit (HCT)                         37.7              36.0-46.0        %  MCV                                       89.7              78.0-100.0       fL  MCHC                                     33.7              30.0-36.0        g/dL  RDW                                      12.9              11.5-15.5        %  Platelet Count (PLT)                     208               150-400          K/uL  Neutrophils, %                           49                43-77            %  Lymphocytes, %                           41                12-46            %  Monocytes, %                             8                 3-12             %  Eosinophils, %                           1                 0-5              %  Basophils, %                             0                 0-1              %  Neutrophils, Absolute                    2.1               1.7-7.7          K/uL  Lymphocytes, Absolute                    1.8               0.7-4.0          K/uL  Monocytes, Absolute                      0.3               0.1-1.0          K/uL  Eosinophils, Absolute                    0.1               0.0-0.7          K/uL  Basophils, Absolute                      0.0               0.0-0.1          K/uL  Additional Information  HL7  RESULT STATUS : F  External IF Update Timestamp : 2008-11-08:12:16:00.000000  Appended Document: stool studies and CBC from [facility] stool all negative except positive lactoferrin (mild inflammation). Please let patient know.  Appended Document: stool studies and CBC from [facility] Pt informed.

## 2010-08-30 ENCOUNTER — Encounter (INDEPENDENT_AMBULATORY_CARE_PROVIDER_SITE_OTHER): Payer: Self-pay | Admitting: *Deleted

## 2010-09-04 NOTE — Letter (Signed)
Summary: Recall, Screening Colonoscopy Only  [facility]  [address]   [place], [place] [address]   Phone: [phone]  Fax: [phone]    August 30, 2010  MARQUITA LIAS [address] [place], [place]  [address] [birth year]   Dear Ms. Anne Hahn,   Our records indicate it is time to schedule your colonoscopy.   Please call our office at [phone] and ask for the nurse.   Thank you, Hendricks Limes, LPN Cloria Spring, LPN  [facility] Ph: [phone]   Fax: [phone]

## 2010-09-23 LAB — DIFFERENTIAL
Basophils Absolute: 0 10*3/uL (ref 0.0–0.1)
Basophils Relative: 0 % (ref 0–1)
Eosinophils Absolute: 0.1 10*3/uL (ref 0.0–0.7)
Eosinophils Relative: 2 % (ref 0–5)
Lymphocytes Relative: 37 % (ref 12–46)
Lymphs Abs: 1.9 10*3/uL (ref 0.7–4.0)
Monocytes Absolute: 0.4 10*3/uL (ref 0.1–1.0)
Monocytes Relative: 8 % (ref 3–12)
Neutro Abs: 2.7 10*3/uL (ref 1.7–7.7)
Neutrophils Relative %: 53 % (ref 43–77)

## 2010-09-23 LAB — BASIC METABOLIC PANEL
BUN: 10 mg/dL (ref 6–23)
CO2: 29 mEq/L (ref 19–32)
Calcium: 10.1 mg/dL (ref 8.4–10.5)
Chloride: 106 mEq/L (ref 96–112)
Creatinine, Ser: 0.86 mg/dL (ref 0.4–1.2)
GFR calc Af Amer: >60 mL/min (ref >60)
GFR calc non Af Amer: >60 mL/min (ref >60)
Glucose, Bld: 127 mg/dL — ABNORMAL HIGH (ref 70–99)
Potassium: 4 mEq/L (ref 3.5–5.1)
Sodium: 141 mEq/L (ref 135–145)

## 2010-09-23 LAB — POCT CARDIAC MARKERS
CKMB, poc: 1.8 ng/mL (ref 1.0–8.0)
CKMB, poc: <1 ng/mL — ABNORMAL LOW (ref 1.0–8.0)
Myoglobin, poc: 56.2 ng/mL (ref 12–200)
Myoglobin, poc: 61.8 ng/mL (ref 12–200)
Troponin i, poc: <0.05 ng/mL (ref 0.00–0.09)
Troponin i, poc: <0.05 ng/mL (ref 0.00–0.09)

## 2010-09-23 LAB — BRAIN NATRIURETIC PEPTIDE: Pro B Natriuretic peptide (BNP): <30 pg/mL (ref 0.0–100.0)

## 2010-09-23 LAB — CBC
HCT: 41.1 % (ref 36.0–46.0)
Hemoglobin: 13.2 g/dL (ref 12.0–15.0)
MCHC: 32.2 g/dL (ref 30.0–36.0)
MCV: 92.1 fL (ref 78.0–100.0)
Platelets: 205 10*3/uL (ref 150–400)
RBC: 4.47 MIL/uL (ref 3.87–5.11)
RDW: 13.1 % (ref 11.5–15.5)
WBC: 5.2 10*3/uL (ref 4.0–10.5)

## 2010-09-26 ENCOUNTER — Other Ambulatory Visit (HOSPITAL_COMMUNITY): Payer: Self-pay | Admitting: Family Medicine

## 2010-09-26 DIAGNOSIS — Z139 Encounter for screening, unspecified: Secondary | ICD-10-CM

## 2010-10-16 LAB — CBC
HCT: 37.7 % (ref 36.0–46.0)
Hemoglobin: 12.7 g/dL (ref 12.0–15.0)
MCHC: 33.7 g/dL (ref 30.0–36.0)
MCV: 89.7 fL (ref 78.0–100.0)
Platelets: 208 10*3/uL (ref 150–400)
RBC: 4.2 MIL/uL (ref 3.87–5.11)
RDW: 12.9 % (ref 11.5–15.5)
WBC: 4.4 10*3/uL (ref 4.0–10.5)

## 2010-10-16 LAB — STOOL CULTURE

## 2010-10-16 LAB — DIFFERENTIAL
Basophils Absolute: 0 10*3/uL (ref 0.0–0.1)
Basophils Relative: 0 % (ref 0–1)
Eosinophils Absolute: 0.1 10*3/uL (ref 0.0–0.7)
Eosinophils Relative: 1 % (ref 0–5)
Lymphocytes Relative: 41 % (ref 12–46)
Lymphs Abs: 1.8 10*3/uL (ref 0.7–4.0)
Monocytes Absolute: 0.3 10*3/uL (ref 0.1–1.0)
Monocytes Relative: 8 % (ref 3–12)
Neutro Abs: 2.1 10*3/uL (ref 1.7–7.7)
Neutrophils Relative %: 49 % (ref 43–77)

## 2010-10-16 LAB — OVA AND PARASITE EXAMINATION: Ova and parasites: NONE SEEN

## 2010-10-16 LAB — FECAL LACTOFERRIN, QUANT: Fecal Lactoferrin: POSITIVE

## 2010-10-16 LAB — CLOSTRIDIUM DIFFICILE EIA: C difficile Toxins A+B, EIA: NEGATIVE

## 2010-10-23 ENCOUNTER — Ambulatory Visit (HOSPITAL_COMMUNITY)
Admission: RE | Admit: 2010-10-23 | Discharge: 2010-10-23 | Disposition: A | Discharge status: Home / Self Care | Payer: BC Managed Care – PPO | Source: Ambulatory Visit | Attending: Family Medicine | Admitting: Family Medicine

## 2010-10-23 DIAGNOSIS — Z1231 Encounter for screening mammogram for malignant neoplasm of breast: Secondary | ICD-10-CM | POA: Insufficient documentation

## 2010-10-23 DIAGNOSIS — Z139 Encounter for screening, unspecified: Secondary | ICD-10-CM

## 2010-11-14 ENCOUNTER — Encounter: Payer: Self-pay | Admitting: Urgent Care

## 2010-11-14 ENCOUNTER — Ambulatory Visit (INDEPENDENT_AMBULATORY_CARE_PROVIDER_SITE_OTHER): Payer: BC Managed Care – PPO | Admitting: Urgent Care

## 2010-11-14 VITALS — BP 123/78 | HR 77 | Temp 97.1°F | Ht 67.0 in | Wt 206.2 lb

## 2010-11-14 DIAGNOSIS — K219 Gastro-esophageal reflux disease without esophagitis: Secondary | ICD-10-CM

## 2010-11-14 DIAGNOSIS — K589 Irritable bowel syndrome without diarrhea: Secondary | ICD-10-CM

## 2010-11-14 DIAGNOSIS — D126 Benign neoplasm of colon, unspecified: Secondary | ICD-10-CM

## 2010-11-14 MED ORDER — HYOSCYAMINE SULFATE 0.125 MG SL SUBL: 0.1250 mg | SUBLINGUAL_TABLET | Freq: Three times a day (TID) | SUBLINGUAL | Status: DC

## 2010-11-14 NOTE — Progress Notes (Signed)
Referring Provider: Evlyn Courier, MD Primary Care Physician:  Evlyn Courier, MD Primary Gastroenterologist:  Dr.   Antony Contras Complaint  Patient presents with  . Abdominal Cramping    IBS    HPI:  [name] is a 65 y.o. female here for follow up as she believed she was due for colonoscopy.   She actually is not due until 11/2013, but she has been having problems w/ abd cramps, acid reflux & diarrhea.  Hx IBS-D & GERD.   Off & on episodes of diarrhea.  Usually certain foods, especially chocolate & lettuce.  Usually 5 stools per day after eating.  Denies any rectal bleeding or melena.  C/o generalized abd pain last couple hrs 10/10, feels like cramps.  Resolves with going to bed.  C/o nausea, denies vomiting.  Lots of heartburn lately.  Not taking prilosec 20 mg but 3 times per week.  Denies any NSAIDs.  Taking occasional ALIGN couple times per month.  Eats yogurt, but dairy seems to bother her.  Takes lactaid seems to help.  Benefiber occasionally seems to help.  Weight increased 6# since fall 2011, less active w/ chronic back pain.    Past Medical History  Diagnosis Date  . IBS (irritable bowel syndrome)     diarrhea predominant  . GERD (gastroesophageal reflux disease)     negative H pylori stool Ag  . HTN (hypertension)   . Chronic back pain   . Tubular adenoma of colon   . Hemorrhoids 11/08/08    Colonoscopy Dr Rourk->hyperplastic polyp, benign bx  . Diverticulosis   . Umbilical hernia     Past Surgical History  Procedure Date  . Cholecystectomy   . Nephrectomy     left  . Cervical disc surgery   . Ectopic pregnancy surgery     bilat  . Appendectomy   . Abdominal hysterectomy     Current Outpatient Prescriptions  Medication Sig Dispense Refill  . ALPRAZolam (XANAX) 0.5 MG tablet Take 0.5 mg by mouth at bedtime as needed.        [name] aspirin 81 MG tablet Take 81 mg by mouth daily.        . cyclobenzaprine (FLEXERIL) 5 MG tablet Take 5 mg by mouth 3 (three) times daily as  needed.        . diclofenac (VOLTAREN) 0.1 % ophthalmic solution 1 drop 4 (four) times daily.        [name] estradiol (CLIMARA - DOSED IN MG/24 HR) 0.05 mg/24hr       . EXFORGE 10-160 MG per tablet       . furosemide (LASIX) 20 MG tablet       . gabapentin (NEURONTIN) 100 MG capsule Take 100 mg by mouth 3 (three) times daily.        [name] HYDROcodone-acetaminophen (VICODIN) 5-500 MG per tablet       . KLOR-CON M20 20 MEQ tablet       . omeprazole (PRILOSEC) 20 MG capsule Take 20 mg by mouth daily.        . rosuvastatin (CRESTOR) 5 MG tablet Take 5 mg by mouth daily.        . hyoscyamine (LEVSIN SL) 0.125 MG SL tablet Place 1 tablet (0.125 mg total) under the tongue 4 (four) times daily -  before meals and at bedtime. As needed for diarrhea  90 tablet  1                Allergies as  of 11/14/2010  . (No Known Allergies)    Family History  Problem Relation Age of Onset  . Cancer Brother 34    gastric    History   Social History  . Marital Status: Married    Spouse Name: N/A    Number of Children: N/A  . Years of Education: N/A   Occupational History  . 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 Smoking status: Never Smoker   . Smokeless tobacco: Not on file  . Alcohol Use: Yes     maybe one a month wine  . Drug Use: No  . Sexually Active: Not on file   Review of Systems: Gen: Denies any fever, chills, sweats, anorexia, fatigue, weakness, malaise, weight loss, and sleep disorder CV: Denies chest pain, angina, palpitations, syncope, orthopnea, PND, peripheral edema, and claudication. Resp: Denies dyspnea at rest, dyspnea with exercise, cough, sputum, wheezing, coughing up blood, and pleurisy. GI: Denies vomiting blood, jaundice, and fecal incontinence.   Denies dysphagia or odynophagia. Derm: Denies rash, itching, dry skin, hives, moles, warts, or unhealing ulcers.  Psych: Denies depression, anxiety, memory loss, suicidal ideation, hallucinations, paranoia, and  confusion. Heme: Denies bruising, bleeding, and enlarged lymph nodes.  Physical Exam: BP 123/78  Pulse 77  Temp(Src) 97.1 F (36.2 C) (Tympanic)  Ht 5\' 7"  (1.702 m)  Wt 206 lb 3.2 oz (93.532 kg)  BMI 32.30 kg/m2 General:   Alert,  Well-developed, well-nourished, pleasant and cooperative in NAD Head:  Normocephalic and atraumatic. Eyes:  Sclera clear, no icterus.   Conjunctiva pink. Mouth:  No deformity or lesions, dentition normal. Neck:  Supple; no masses or thyromegaly. Heart:  Regular rate and rhythm; no murmurs, clicks, rubs,  or gallops. Abdomen:  Soft, nontender and nondistended. No masses, hepatosplenomegaly.  Small easily reducible umbilical hernia, non-tender.  Normal bowel sounds, without guarding, and without rebound.   Msk:  Symmetrical without gross deformities. Normal posture. Pulses:  Normal pulses noted. Extremities:  Without clubbing or edema. Neurologic:  Alert and  oriented x4;  grossly normal neurologically. Skin:  Intact without significant lesions or rashes. Cervical Nodes:  No significant cervical adenopathy. Psych:  Alert and cooperative. Normal mood and affect.

## 2010-11-14 NOTE — Patient Instructions (Addendum)
Begin calcium 600mg  twice per day  Lactose free diet Take ALIGN daily Take Prilosec 20mg  daily Take Benefiber daily Use levsin as needed for diarrhea STOP Levbid  Lactose Free Diet Lactose is a carbohydrate that is found mainly in milk and milk products, as well as in foods with added milk or whey. Lactose must be digested by the enzyme in order to be used by the body. Lactose intolerance occurs when there is a shortage of lactase. When your body is not able to digest lactose, you may feel sick to your stomach (nausea), bloating, cramping, gas and diarrhea. TYPES OF LACTASE DEFICIENCY  Primary lactase deficiency. This is the most common type. It is characterized by a slow decrease in lactase activity.   Secondary lactase deficiency. This occurs following injury to the small intestinal mucosa as a result of diseases such as celiac disease, nontropical sprue, infectious gastroenteritis (stomach virus), malnutrition, parasites, or inflammatory bowel disease. It can also occur after treatment with medications that kill germs (antibiotics) or cancer drugs, or as a result of surgery.  Tolerance to lactose varies widely, and each person must determine how much milk can be consumed without developing symptoms. Drinking smaller portions of milk throughout the day may be helpful. Some studies suggest that slowing gastric emptying may help increase tolerance of milk products. This may be done by:  Consuming milk or milk products with a meal rather than alone.   Using milk with a higher fat content.  There are many dairy products that may be tolerated better than milk by some people:  Cheese (especially aged cheese) - the lactose content is much lower than in milk.   The use of cultured dairy products such as yogurt, buttermilk, cottage cheese, and sweet acidophilus milk (Kefir) for lactase-deficient individuals is usually well tolerated. This is because the healthy bacteria help digest lactose.    Lactose-hydrolyzed milk (Lactaid) contains 40-90% less lactose than milk and may also be well tolerated.  ADEQUACY These diets may be deficient in calcium, riboflavin, and vitamin D, according to the Recommended Dietary Allowances of the [employer]. Depending on individual tolerances and the use of milk substitutes, milk, or other dairy products, these recommendations may be met. SPECIAL NOTES  Lactose is a carbohydrates. The major food source is dairy products. Reading food labels is important. Many products contain lactose even when they are not made from milk. Look for the following words: whey, milk solids, dry milk solids, nonfat dry milk powder. Typical sources of lactose other than dairy products include breads, candies, cold cuts, prepared and processed foods, and commercial sauces and gravies.   All foods must be prepared without milk, cream, or other dairy foods.   A vitamin/mineral supplement may be necessary. Consult your physician or Registered Dietitian.   Lactose also is found in many prescription and over-the-counter medications.   Soy milk and lactose-free supplements may be used as an alternative to milk.  FOOD GROUP ALLOWED/RECOMMENDED AVOID/USE SPARINGLY  BREADS / STARCHES 4 servings or more* Breads and rolls made without milk. [place], [place], or [place] bread. Breads and rolls that contain milk. Prepared mixes such as muffins, biscuits, waffles, pancakes. Sweet rolls, donuts, [place] toast (if made with milk or lactose).  Crackers: Soda crackers, graham crackers. Any crackers prepared without lactose. Zwieback crackers, corn curls, or any that contain lactose.  Cereals: Cooked or dry cereals prepared without lactose (read labels). Cooked or dry cereals prepared with lactose (read labels). Total, Cocoa Krispies. Special K.  Potatoes / Pasta / Rice: Any prepared without milk or lactose. Popcorn. Instant potatoes, frozen [place] fries, scalloped or au gratin  potatoes.  VEGETABLES 2 servings or more Fresh, frozen, and canned vegetables. Creamed or breaded vegetables. Vegetables in a cheese sauce or with lactose-containing margarines.  FRUIT 2 servings or more All fresh, canned, or frozen fruits that are not processed with lactose. Any canned or frozen fruits processed with lactose.  MEAT & SUBSTITUTES 2 servings or more (4 to 6 oz. total per day) Plain beef, chicken, fish, [place], lamb, veal, pork, or ham. Kosher prepared meat products. Strained or junior meats that do not contain milk. Eggs, soy meat substitutes, nuts. Scrambled eggs, omelets, and souffles that contain milk. Creamed or breaded meat, fish, or fowl. Sausage products such as wieners, liver sausage, or cold cuts that contain milk solids. Cheese, cottage cheese, or cheese spreads.  MILK None. (See "BEVERAGES" for milk substitutes. See "DESSERTS" for ice cream and frozen desserts.) Milk (whole, 2%, skim, or chocolate). Evaporated, powdered, or condensed milk; malted milk.  SOUPS & COMBINATION FOODS Bouillon, broth, vegetable soups, clear soups, consomms. Homemade soups made with allowed ingredients. Combination or prepared foods that do not contain milk or milk products (read labels). Cream soups, chowders, commercially prepared soups containing lactose. Macaroni and cheese, pizza. Combination or prepared foods that contain milk or milk products.  DESSERTS & SWEETS In moderation Water and fruit ices; gelatin; angel food cake. Homemade cookies, pies, or cakes made from allowed ingredients. Pudding (if made with water or a milk substitute). Lactose-free tofu desserts. Sugar, honey, corn syrup, jam, jelly; marmalade; molasses (beet sugar); Pure sugar candy; marshmallows. Ice cream, ice milk, sherbet, custard, pudding, frozen yogurt. Commercial cake and cookie mixes. Desserts that contain chocolate. Pie crust made with milk-containing margarine; reduced-calorie desserts made with a sugar substitute  that contains lactose. Toffee, peppermint, butterscotch, chocolate, caramels.  FATS & OILS In moderation Butter (as tolerated; contains very small amounts of lactose). Margarines and dressings that do not contain milk, Vegetable oils, shortening, Miracle Whip, mayonnaise, nondairy cream & whipped toppings without lactose or milk solids added (examples: Coffee Rich, Carnation Coffeemate, Rich's Whipped Topping, PolyRich). Tomasa Blase. Margarines and salad dressings containing milk; cream, cream cheese; peanut butter with added milk solids, sour cream, chip dips, made with sour cream.  BEVERAGES Carbonated drinks; tea; coffee and freeze-dried coffee; some instant coffees (check labels). Fruit drinks; fruit and vegetable juice; Rice or Soy milk. Ovaltine, hot chocolate. Some cocoas; some instant coffees; instant iced teas; powdered fruit drinks (read labels).   CONDIMENTS / MISCELLANEOUS Soy sauce, carob powder, olives, gravy made with water, baker's cocoa, pickles, pure seasonings and spices, wine, pure monosodium glutamate, catsup, mustard. Some chewing gums, chocolate, some cocoas. Certain antibiotics and vitamin / mineral preparations. Spice blends if they contain milk products. MSG extender. Artificial sweeteners that contain lactose such as Equal (Nutra-Sweet) and Sweet 'n Low. Some nondairy creamers (read labels).  * These amounts indicate the minimum number of servings needed from the basic food groups to provide a variety of nutrients essential to good health. A maximum amount is listed if intake of certain foods must be controlled. Combination foods may count as full or partial servings from the food groups. Dark green, leafy, or orange vegetables are recommended 3 or 4 times weekly to provide vitamin A. A good source of vitamin C is recommended daily. Potatoes may be included as a serving of vegetables. SAMPLE MENU*  Breakfast   Orange Juice.  Banana.  Bran flakes.   Nondairy  Creamer.  Vienna Bread (toasted).   Butter or milk-free margarine.   Coffee or tea.    Noon Meal   Chicken Breast.  Rice.   Green beans.   Butter or milk-free margarine.  Fresh melon.   Coffee or tea.    Evening Meal   Roast Beef.  Baked potato.   Butter or milk-free margarine.   Broccoli.   Lettuce salad with vinegar and oil dressing.  [employer].   Coffee or tea.   Document Released: 12/14/2001 Document Re-Released: 12/21/2007 [facility] Patient Information 2011 [place], [place].

## 2010-11-14 NOTE — Assessment & Plan Note (Signed)
[name] is a 65 y.o. black female w/ hx GERD, not taking PPI on regular basis.  Resume omeprazole 20mg  daily as it seems to help when she takes it.

## 2010-11-14 NOTE — Assessment & Plan Note (Addendum)
Long discussion regarding chronic course of IBS-D & treatment strategies.  Begin ALIGN, fiber & prn anti-spasmotics.  Avoid dairy as this seems to bother her.  Can use lactaid.    Hx tubular adenomatous colon polyps.  Next colonoscopy 11/2013. Begin calcium 600mg  twice per day Lactose free diet Take ALIGN daily Take Prilosec 20mg  daily Take Benefiber daily Use levsin as needed for diarrhea STOP Levbid

## 2010-11-14 NOTE — Progress Notes (Signed)
Cc to PCP 

## 2010-11-20 NOTE — Assessment & Plan Note (Signed)
[name], [name]                CHART#:  [id number]   DATE:  05/11/2008                       DOB:  Jan 17, 1946   PRIMARY CARE PHYSICIAN:  Alice Friendly. Hill, MD.   CHIEF COMPLAINT:  Followup diarrhea and IBS.   PROBLEM LIST:  1. Chronic intermittent abdominal pain and diarrhea with history of      irritable bowel syndrome.  2. History of colonic adenoma.  3. History of benign colonic ulcer on last colonoscopy by Dr. Jena [name] on      08/05/2005.  4. Diverticulosis.  5. Chronic back pain.  6. Hypertension.  7. Status post cholecystectomy.  8. Status post left nephrectomy.  9. Status post tubal pregnancy twice and dilation and curettage.  10.Status post appendectomy.  11.Status post hysterectomy.   SUBJECTIVE:  The patient is a 65 year old African American female.  She  has been started on Digestive Advantage for her chronic intermittent  diarrhea and IBS.  She says this does seem to help some.  She has had  only 2 episodes of diarrhea since she was seen on 04/01/2008 by Dr.  Jena [name].  Between those episodes, she is having regular bowel movements.  She had 2 severe bouts of abdominal pain, which lasted all night long  from about 9:00 p.m. to 3:30 a.m. along with too numerous to count  diarrhea.  She tells me she took half of her nerve pill, which does seem  to help things.  She is also taking HyoMax b.i.d.  She is not taking any  NSAIDs.  She has not noticed any blood in her stool.  She has seen some  mucus.  She has chronic back pain and she is having some left-sided  flank and back pain for which she tells me she is going to have a CT  scan ordered by [name] very soon.   CURRENT MEDICATIONS:  See at the list from May 11, 2008.   ALLERGIES:  No known drug allergies.   OBJECTIVE:  VITAL SIGNS:  Weight 207 pounds, height 67 inches,  temperature 97.5, blood pressure 108/70, and pulse 60.  GENERAL:  She is a well-developed and well-nourished [place] American  female who is  alert, oriented, pleasant, and cooperative, in no acute  distress.  HEENT:  Sclerae clear and nonicteric.  Conjunctivae pink.  Oropharynx  pink and moist without any lesions.  CHEST:  Heart regular rate and rhythm.  Normal S1 and S2.  ABDOMEN:  Protuberant with positive bowel sounds x4.  She has a large  midline vertical scar, which is well healed.  Abdomen is soft,  nontender, nondistended without palpable mass or hepatosplenomegaly.  No  rebound, tenderness, or guarding.  EXTREMITIES:  Without clubbing or edema.   ASSESSMENT:  The patient is a 65 year old African American female with  history of irritable bowel syndrome and colonic adenoma as well as  diverticulosis and a benign colonic ulcer.  She continues to have  intermittent bouts of severe abdominal cramping and diarrhea.  Interestingly, they have been nocturnal.  She is having some left flank  and left back pain and is scheduled to have a CT scan through [name]  sometime in the next week or so.   PLAN:  1. We did discuss proceeding with colonoscopy for further evaluation      of her  chronic diarrhea to rule out microscopic colitis or early      onset irritable bowel disease, which may have evolved given her      history of benign colonic ulcer.  She is wanting to follow through      with CT scan scheduled by [name] first and now feel this is      reasonable.  Pending CT findings will schedule colonoscopy with      biopsies with Dr. Jena [name] in the near future if needed.  2. She is to continue HyoMax 0.375 b.i.d.  3. Continue Digestive Advantage Irritable Bowel Syndrome.  I have      given her samples today.       [name], N.P.  Electronically Signed     R. Roetta Sessions, M.D.  Electronically Signed    KJ/MEDQ  D:  05/11/2008  T:  05/11/2008  Job:  [id number]   cc:   Alice Friendly. Loleta Chance, MD

## 2010-11-20 NOTE — Op Note (Signed)
NAMERAYSHELL, GOECKE               ACCOUNT NO.:  [id number]   MEDICAL RECORD NO.:  [id number]          PATIENT TYPE:  AMB   LOCATION:  DAY                           FACILITY:  [facility]   PHYSICIAN:  R. Roetta Sessions, M.D. DATE OF BIRTH:  1946-05-31   DATE OF PROCEDURE:  11/08/2008  DATE OF DISCHARGE:                               OPERATIVE REPORT   PROCEDURE:  Colonoscopy with biopsy and stool sampling.   INDICATIONS FOR PROCEDURE:  A 65 year old lady with chronic intermittent  diarrhea, found to be fecal occult blood positive recently.  She has a  history of colonic polyps.  Colonoscopy is now being done.  Risks,  benefits, alternatives, and limitations have been reviewed and questions  answered.  Although she is Hemoccult positive, she has not had any gross  bleeding.   PROCEDURE NOTE:  O2 saturation, blood pressure, pulse, and respirations  were monitored throughout the entire procedure.  Conscious sedation:  Versed 4 mg IV and Demerol 75 mg IV in divided doses.  Instrument:  Pentax video chip system.   FINDINGS:  Digital rectal exam revealed no abnormalities.  The colonic  prep was adequate.  Colon:  Colonic mucosa was surveyed from the  rectosigmoid junction through the left transverse and right colon to the  appendiceal orifice, ileocecal valve, and cecum where these structures  well seen and photographed for the record.  The terminal ileum was  intubated 10 cm from this level.  The scope was slowly cautiously  withdrawn.  All previously mentioned mucosal surfaces were again seen.  The patient was noted to have scattered pancolonic diverticula.  There  is diminutive polyp in the mid sigmoid which was cold biopsied/removed  on the way in.  The remainder of the colonic mucosa and terminal ileal  mucosa, however, appeared entirely normal.  A stool sample was submitted  to microbiology lab.  Biopsies of the descending segment were taken to  any rule out microscopic colitis.  The  scope was pulled down in rectum  where a thorough examination of the rectal mucosa including retroflexed  view of the anal verge demonstrated minimal internal hemorrhoids,  otherwise the rectal mucosa appeared normal.  The patient tolerated the  procedure well.  Cecal withdrawal time 7 minutes.   IMPRESSION:  1. Minimal internal hemorrhoids, otherwise normal rectum.  2. Pancolonic diverticula.  3. Diminutive mid sigmoid polyp, status post cold biopsy removal.  4. Status post segmental biopsies of descending segment and status      post stool sampling.  Remainder colonic mucosa and terminal ileal      mucosa appeared normal.   RECOMMENDATIONS:  1. CBC today.  2. Followup on pending studies.  3. Further recommendations to follow.      Jonathon Bellows, M.D.  Electronically Signed     RMR/MEDQ  D:  11/08/2008  T:  11/08/2008  Job:  [id number]   cc:   Annia Friendly. Loleta Chance, MD  Fax: [phone]

## 2010-11-20 NOTE — Assessment & Plan Note (Signed)
[name], [name]                CHART#:  [id number]   DATE:  04/01/2008                       DOB:  Nov 07, 1945   Last seen here on 04/09/2007.  This lady has a history of irritable  bowel syndrome, diarrhea predominant, history of diverticulosis, and  history of colonic adenoma in the distant past.  She underwent  colonoscopy lastly by me on 08/05/2005 largely as a surveillance  maneuver.  She was found to have pancolonic diverticula, diminutive  polyp, which was removed, found to be hyperplastic and benign-appearing  ulcerated area in the mid descending colon.  Biopsies demonstrated  benign ulceration.  The patient has had difficulties off and on with  diarrhea about 65% of the time.  She has abdominal cramps every other  day.  She does not have nocturnal diarrhea.  She did not have any blood  per rectum.  She has actually gained 13 pounds since she was last seen  on 04/09/2007.  Her reflux symptoms are well controlled on Nexium 40 mg  orally daily.  She is not seeing Dr. Loleta Chance recently.   CURRENT MEDICATIONS:  See updated list.   ALLERGIES:  No known drug allergies.   PHYSICAL EXAMINATION:  GENERAL:  Today, she appears well groomed, in no  acute distress.  VITAL SIGNS:  Weight 205, height 5 feet 7 inches, temperature 97.8, BP  108/78, and pulse 76.  ABDOMEN:  Nondistended, positive bowel sounds, entirely soft, and  nontender without appreciable mass or organomegaly.   ASSESSMENT:  Chronic symptoms of intermittent abdominal cramping with a  predominance of diarrhea consistent with irritable bowel syndrome,  history of a colonic adenoma in distant past, diverticulosis, and  history of a benign colonic ulcer (biopsy proven) previously may be  related to Actonel or occult nonsteroidal antiinflammatory drug use.   RECOMMENDATIONS:  She has been using HyoMax on occasion 0.375 mg orally  b.i.d.  We will have her continue that regimen.  In the time being, we  will add Digestive  Advantage and probiotics to the regimen for the next  4 weeks.  Her symptoms seemed to be a little bit more recalcitrant these  days and would have to wonder about some other process i.e. early-onset  inflammatory bowel disease and evolution.  I told her we will see her  back in about 6 weeks to  see how things are going unless she was markedly improved.  We will  consider repeating her colonoscopy in the near future.       Jonathon Bellows, M.D.  Electronically Signed     RMR/MEDQ  D:  04/01/2008  T:  04/01/2008  Job:  [id number]   cc:   Annia Friendly. Loleta Chance, MD

## 2010-11-20 NOTE — Letter (Signed)
December 08, 2007    Annia Friendly. Loleta Chance, MD  [address], [address]  [place],  [place] [address]   RE:  Alice, [name]  MRN:  [id number]  /  DOB:  Jun 28, 1946   Dear Earvin Hansen:   It was my pleasure reassessing [name] in the office today at your  request.  This nice woman was previously seen by Dr. Dorethea Clan for pedal  edema and dyspnea.  A stress Myoview study showed impaired exercise  capacity, but normal cardiac function and perfusion.  Likewise a 2-D  echocardiogram was normal with vigorous left ventricular systolic  function and mild LVH.  She subsequently was seen by Dr. Sherene Sires for  pulmonary evaluation.  PFTs were reportedly normal.  He changed her ACE  inhibitor to an ARB without any notable change in her symptoms.   She has continued to be overweight, but is relatively active including  walking on a treadmill for up to 30 minutes 3 days per week.  She has  attempted to lose weight without success.  Recent laboratory includes a  normal chemistry profile and normal CPK.   [name] experience sharp left inframammary chest discomfort a few  months ago.  There was a pleuritic component.  This has entirely  resolved.  Her chronic dyspnea is unchanged.  She reports difficulty  sometimes with long conversations or while she is singing.  At other  times, she has exertional symptoms that resolved fairly promptly with  rest.  She has not found anything she can be due to mitigate symptoms  except not to be active.   Current medications include furosemide 20 mg daily, KCl 20 mEq daily,  [place] 10/320 mg daily, Levbid 0.375 mg b.i.d.  She uses Xanax and  ibuprofen on a p.r.n. basis.   Social history, family history, and review of systems were updated.  There are no notable additions.   On exam, pleasant woman in no acute distress.  The weight is 203 pounds,  6 pounds more than in 2007.  Blood pressure 120/75, heart rate 70 and  regular, respirations 14.  NECK:  No jugular venous  distention; normal carotid upstrokes without  bruits.  LUNGS:  Clear.  CARDIAC:  Normal second heart sounds; split first heart sound.  ABDOMEN:  Soft and nontender; no masses; no organomegaly.  EXTREMITIES:  Trace edema; normal distal pulses.  ENDOCRINE:  No thyromegaly.  HEMATOPOIETIC:  No adenopathy.  SKIN:  Multiple keratoses.   IMPRESSION:  [name] has a long history of respiratory symptoms that  do not clearly represent true dyspnea.  I have recommended weight loss  and continued rest and regular physical activity.  Since she probably  does not have cardiac problems, I suspect that follow-up with Dr. Sherene Sires  is more likely to be productive than a return visit with me, but I would  be happy to reassess this woman at any time you deem appropriate.    Sincerely,      Gerrit Friends. Dietrich Pates, MD, [facility]  Electronically Signed    RMR/MedQ  DD: 12/08/2007  DT: 12/08/2007  Job #: [id number]

## 2010-11-20 NOTE — Assessment & Plan Note (Signed)
NAMEARISBEL, MAIONE                CHART#:  [id number]   DATE:                                   DOB:  [birth year]   PHYSICIAN COSIGNING NOTE:  Jonathon Bellows, MD.   CHIEF COMPLAINT:  IBS flare.   SUBJECTIVE:  Ms. Pallas is a 65 year old female with a history of IBS  and diverticulosis.  She alternates between constipation and diarrhea.  She tells me she started Actonel monthly.  The following day, she began  to have severe diarrhea.  She had upwards of five to eight stools per  day for about five to six days.  Yesterday she had two watery stools,  and things do seem to be getting better.  She did have some low  abdominal cramps and urgency, but she tells me she is only eating  approximately one to two large meals a day.  She has had no significant  urgency.  She also complains of some gas and bloating along with some  epigastric and chest pain, which she describes as a gas bubble.  She  is on HyaMax b.i.d. as well as Nexium 40 mg daily.  She denies any NSAID  use at this time.  She denies any fever or chills with this.  Last  colonoscopy was by Dr. Jena Gauss on August 05, 2005, and she was found to  have pancolonic diverticula and a 3-mm polyp at 30 cm, which showed  hyperplastic change.  Rectal biopsy showed chronic, active inflammation  and focal increased fibrosis in the lamina propria.  She has a previous  history of tubulous adenoma.   CURRENT MEDICATIONS:  See the list from April 09, 2007.   ALLERGIES:  No known drug allergies.   OBJECTIVE:  VITAL SIGNS:  Weight 192 pounds, height 67 inches, temp 98  degrees, blood pressure 120/80, pulse 84.  GENERAL:  Ms. Rockman is a well-developed, well-nourished, African-  American female in no acute distress.  HEENT:  Sclerae are clear, noninjected conjunctivae, oropharynx moist  and pink without any lesions.  CHEST:  Heart is regular rate and rhythm, normal S1 and S2.  ABDOMEN:  Positive bowel sounds x4, no bruits auscultated.  She  does  have a large, midline, vertical scar to the lower abdomen as well as  well-healed scars to the left upper quadrant and right upper quadrant.  The abdomen is soft, nontender, and nondistended without palpable masses  or organomegaly.  No rebound tenderness or guarding.  EXTREMITIES:  Without clubbing or edema bilaterally.   IMPRESSION:  1. Ms. Pangilinan is a 65 year old, African-American female with a flare      of her irritable bowel syndrome.  She has a history of      diverticulosis, but no evidence of active diverticulitis at this      time.  I suspect she may have had diarrhea brought on by Actonel.  2. History of tubulous adenoma due for surveillance in 2012.   PLAN:  1. Colonoscopy in January of 2012.  2. Gas/bloat literature.  3. Align or Flora-Q probiotics once daily.  4. Continue HyaMax 0.375 mg b.i.d. p.r.n.  5. One year office visit with Dr. Jena Gauss.  6. She is going to call if diarrhea returns or if she has any further  problems in the interim.       Lorenza Burton, N.P.  Electronically Signed     R. Roetta Sessions, M.D.  Electronically Signed    KJ/MEDQ  D:  04/09/2007  T:  04/09/2007  Job:  [id number]   cc:   Deniece Portela A. Sheffield Slider, M.D.

## 2010-11-20 NOTE — Assessment & Plan Note (Signed)
[name], [name]                CHART#:  [id number]   DATE:  07/12/2008                       DOB:  1945-09-04   FOLLOWUP:  Intermittent diarrhea punctuated with long periods of a  normal bowel function, history of colonic polyps, negative colonoscopy  in 2007 except for benign colonic ulcer and history of diverticulosis.  She was last seen on 05/11/2008.  She is having intermittent abdominal  cramps and diarrhea particularly if she gets stressed out.  She has  diarrhea.  She has not had any melena or rectal bleeding.  Weight is  down 4 pounds since her last visit.  She does take HyoMax b.i.d., which  significantly improves and combats her abdominal cramps and diarrhea.  She is also on Vicodin chronically for low back pain. If she misses a  few doses of Vicodin, diarrhea seems to worsen as well.  She certainly  does not have any nocturnal diarrhea.  Her symptoms go back for years.  There is no family history of inflammatory bowel disease or celiac  disease.  For that matter, she is slated for surveillance colonoscopy in  2012.  Given her history of colonic polyps, reflux symptoms well  controlled on Nexium 40 mg orally daily.   The patient did undergo a CT scan for abdominal pain, she was having  back in early November by Dr. Mirna Mires.  There was several hyperdense  lesions in the liver, which were nonspecific.  Really nothing else aside  from diverticulosis.  It was recommended that the MRI be performed that  has not yet been done.  I do not have any recent liver enzymes for the  patient.   CURRENT MEDICATIONS:  See updated list.  In addition to the above-  mentioned medications, she does take Digestive Advantage for diarrhea.   ALLERGIES:  No known drug allergies.   PHYSICAL EXAMINATION:  GENERAL:  Today, she appears well groomed 65-year-  old lady resting comfortably.  VITAL SIGNS:  Weight 203, height 5 feet 7 inches, temperature 97.9, BP  120/80, and pulse 84.  SKIN:   Warm and dry.  CHEST:  Lungs are clear to auscultation.  CARDIAC:  Regular rate and rhythm without murmur, gallop, or rub.  ABDOMEN:  Nondistended, obese, positive bowel sounds, soft, nontender  without appreciable mass or organomegaly.   IMPRESSION:  The patient is a pleasant 65 year old longstanding  intermittent abdominal cramps and diarrhea most consistent with  irritable bowel syndrome.  It would be highly unlikely, I doubt  medication effect particularly in view of the fact she not infrequently  has normal bowel function and may go an entire day without a bowel  movement.   Nonspecific liver lesions.  I have to agree with the radiologist to  cover all the bases.  We probably had to go ahead and do an MRI of the  abdomen with the liver.  I discussed approach with the patient.  I will  also obtain his LFTs to make sure they are normal.  We will then make  further specific recommendations in regards to the management of her  diarrhea once we have management/evaluation of diarrhea once we have the  MRI and lab results for review.       Jonathon Bellows, M.D.  Electronically Signed     RMR/MEDQ  D:  07/12/2008  T:  07/12/2008  Job:  [id number]   cc:   Annia Friendly. Loleta Chance, MD

## 2010-11-23 NOTE — Assessment & Plan Note (Signed)
[facility]                               PULMONARY OFFICE NOTE   NAME:[name], [name]                      MRN:          [id number]  DATE:03/27/2006                            DOB:          1946-06-18    HISTORY OF PRESENT ILLNESS:  The patient is a 65 year old African American  female patient of Dr. Sherene Sires, who presents for a 53-month followup.  The  patient was seen approximately 1 month ago for persistent dyspnea with  minimal activity.  The patient had evidence of classic voice fatigue with  pseudoasthma previously when seen by Dr. Sherene Sires, felt secondary to ACE  inhibitor intolerance plus reflux.  The patient's ACE inhibitor was  discontinued and the patient was given Nexium daily.  The patient returned  without any significant improvement in symptoms.  The patient reports that  she becomes short of breath quite easily with minimal activity and it seems  that these all began shortly after July 5, from when she had neck surgery  requiring endotracheal intubation.   The patient has brought all of her medications in today for review.  They do  in fact match our medication list.   The patient has undergone a CT chest and neck which I do not have the  results for at this time.  I am going to retrieve these and review them in  detail.   The patient denies any chest pain, palpitations, orthopnea, PND, or leg  swelling.   CURRENT MEDICATIONS:  Were correct on review.   PHYSICAL EXAMINATION:  GENERAL:  The patient is a pleasant obese female in  no acute distress.  VITAL SIGNS:  She is afebrile with stable vital signs.  O2 saturation is 99%  on room air.  HEENT:  Unremarkable.  NECK:  Supple without cervical adenopathy.  No JVD.  RESPIRATORY:  Lung sounds are clear.  CARDIAC:  Regular rate and rhythm.  ABDOMEN:  Soft, nontender.  EXTREMITIES:  Warm without any edema.   IMPRESSION/PLAN:  1. Persistent dyspnea with minimal activity.  A CT chest and  neck, results      are pending and we will obtain these results and discuss them with Dr.      Sherene Sires.  2. Complex medication regimen.  The patient's medications were reviewed in      detail.  Patient      education is provided.  A computerized medication calendar was      completed for this patient and reviewed in detail.  The patient is      aware to bring this back to each and every visit.                                   Rubye Oaks, NP                                Charlaine Dalton. Sherene Sires, MD, [facility]   TP/MedQ  DD:  03/28/2006  DT:  03/31/2006  Job #:  [id number]

## 2010-11-23 NOTE — Procedures (Signed)
[name], [name]               ACCOUNT NO.:  [id number]   MEDICAL RECORD NO.:  [id number]          PATIENT TYPE:  OUT   LOCATION:  SLEEP LAB                     FACILITY:  [facility]   PHYSICIAN:  Barbaraann Share, MD,FCCPDATE OF BIRTH:  11-09-45   DATE OF STUDY:  05/09/2006                              NOCTURNAL POLYSOMNOGRAM   REFERRING PHYSICIAN:  Dr. Osborn Coho   INDICATION FOR THE STUDY:  Hypersomnia with sleep apnea.   EPWORTH SCORE:  4   SLEEP ARCHITECTURE:  The patient had a total sleep time of 376 minutes with  decreased REM and minimal amounts of slow wave sleep.  Sleep onset latency  was normal as was REM onset.  Sleep efficiency was adequate at 89%.   RESPIRATORY DATA:  The patient was found to have 38 hypopneas and 21 apneas  for a respiratory disturbance index of 9 events per hour.  The events were  clearly worse in the supine position, and mild snoring was noted throughout.  The patient did not meet split-night criteria secondary to the small numbers  of events which occurred in the initial part of the night.  The majority of  her events occurred after 3:00 a.m.   OXYGEN DATA:  There was O2 desaturation as low as 86%.   CARDIAC DATA:  No clinically significant cardiac arrhythmias.   MOVEMENT/PARASOMNIA:  The patient was found to have 10 leg jerks that were  not clinically significant.   IMPRESSION/RECOMMENDATIONS:  Mild obstructive sleep apnea/hypopnea syndrome  with a respiratory disturbance index of 9 events per hour and O2  desaturation as low as 86%.  Treatment for this degree of sleep apnea should  focus on weight loss alone if applicable, upper airway surgery, oral  appliance, and also CPAP.  Clinical correlation is suggested.                                            ______________________________  Barbaraann Share, MD,FCCP  Diplomate, American Board of Sleep  Medicine     KMC/MEDQ  D:  05/15/2006 15:14:38  T:  05/16/2006 08:13:43  Job:   [id number]

## 2010-11-23 NOTE — Op Note (Signed)
[name], [name]               ACCOUNT NO.:  [id number]   MEDICAL RECORD NO.:  [id number]          PATIENT TYPE:  OIB   LOCATION:  2899                         FACILITY:  [facility]   PHYSICIAN:  Reinaldo Meeker, M.D. DATE OF BIRTH:  Jan 10, 1946   DATE OF PROCEDURE:  11/13/2004  DATE OF DISCHARGE:                                 OPERATIVE REPORT   PREOPERATIVE DIAGNOSIS:  Herniated disc C5-C6, right, with spondylosis.   POSTOPERATIVE DIAGNOSIS:  Herniated disc C5-C6, right, with spondylosis.   PROCEDURE:  C5-C6 anterior cervical discectomy with bone bank fusion  followed by Zephyr anterior cervical plating.   SURGEON:  Reinaldo Meeker, M.D.   ASSISTANT:  Kathaleen Maser. Pool, M.D.   PROCEDURE IN DETAIL:  After being placed in the supine position in 5 pounds  of halter traction, the patient's neck was prepped and draped in the usual  sterile fashion.  Localizing x-ray was taken prior to the incision to  identify the appropriate level.  A transverse incision was made in the right  anterior neck starting at the midline and heading towards the medial aspect  of the sternocleidomastoid muscle.  The platysmal muscle was incised  transversely.  The natural fascial plane between the strap muscles medially  and the sternocleidomastoid muscle laterally was identified and followed  down to the anterior aspect of the cervical spine.  The longus colli muscles  were identified, split in the midline, swept away bilaterally, a Kitner  dissector, and unipolar coagulation.  Self-retaining retractor was placed  for exposure.  Fluoroscopy showed we approached the appropriate level and  the C5-C6 disc was incised.  Using pituitaries, rongeurs, and curets,  approximately 90% of the disc material was removed.  A high speed drill was  used to widen the interspace.  The microscope was draped and brought into  the field and used until the end of the case.  Using microdissection  technique, the remainder of disc  material on the posterior longitudinal  ligament was removed.  The ligament was incised transversely.  The cut edge  was removed with a Kerrison punch.  Decompression was carried out first  towards the left asymptomatic side.  Decompression towards the right,  symptomatic side, showed bony overgrowth as well as disc herniation.  This  was removed to decompress the underlying C6 nerve root.  At this point,  inspection was carried out in all directions looking for residual  compression and none could be identified.  Large amounts of irrigation were  carried out.  Any bleeding was controlled with bipolar coagulation and  Gelfoam.  Measurements were taken and an 8 mm bone bank plug was  reconstituted.  After irrigating once more and confirming hemostasis, the  plug was impacted without difficulty.  An appropriate length Zephyr anterior  cervical plate was then chosen.  Under fluoroscopic guidance, drill holes  were placed, followed by placement of 13 mm screws x 4.  The locking  mechanism was then secured.  Final fluoroscopy showed the plate, plug, and  screws to all be in good position.  Large amounts of irrigation was  carried  out at this time with any bleeding controlled by bipolar  coagulation.  The wound was then closed using interrupted Vicryl in the  platysma muscle, inverted 5-0 PDS on the subcuticular layer, and Steri-  Strips on the skin.  Sterile dressings were then applied and the patient was  extubated and taken to the recovery room in stable condition.      ROK/MEDQ  D:  11/13/2004  T:  11/13/2004  Job:  [id number]

## 2010-11-23 NOTE — Procedures (Signed)
[name], [name]               ACCOUNT NO.:  [id number]   MEDICAL RECORD NO.:  [id number]          PATIENT TYPE:  REC   LOCATION:                                FACILITY:  [facility]   PHYSICIAN:  Vida Roller, M.D.   DATE OF BIRTH:  Nov 12, 1945   DATE OF PROCEDURE:  03/27/2005  DATE OF DISCHARGE:                                    STRESS TEST   Patient's bra size is 40D.   BRIEF HISTORY:  This is a 65 year old African-American female with a history  of hypertension, hyperlipidemia, positive family history of coronary artery  disease who had presented with chest pain and shortness of breath shortly  after having neck surgery in May.   TYPE OF TEST TODAY:  Exercise Myoview.   PA PERFORMING THE TEST:  April Humphrey, Nurse Practitioner.   INDICATIONS:  Chest pain and dyspnea.   SYMPTOMS DURING THE TEST:  Patient was extremely short of breath.  No chest  pain but fatigued very easily.  Arrhythmias/PVCs noted during the test.   REASON FOR STOPPING THE TEST:  Patient achieved heart rate and test ended.  There was some ST depression noted during the procedure.  Resting heart rate  was 86, resting blood pressure was 128/82, target heart rate was 137, max  predicted heart rate was 161, percent of max predicted heart rate was 98,  worse case ST slope was 18, worse case ST level was -2.6, max heart rate was  155, max blood pressure was 192/88, total exercise time was 4 minutes, METS  achieved was 4.6.  ST depressions noted in leads II, III and aVF, V4, V5 and  V6.  Final images pending cardiology MD review.     ______________________________  April Humphrey, NP      Vida Roller, M.D.  Electronically Signed    AH/MEDQ  D:  03/27/2005  T:  03/27/2005  Job:  [id number]

## 2010-11-23 NOTE — Op Note (Signed)
[name], Alice               ACCOUNT NO.:  [id number]   MEDICAL RECORD NO.:  [id number]          PATIENT TYPE:  AMB   LOCATION:  DAY                           FACILITY:  [facility]   PHYSICIAN:  R. Roetta Sessions, M.D. DATE OF BIRTH:  [birth year]   DATE OF PROCEDURE:  08/05/2005  DATE OF DISCHARGE:                                 OPERATIVE REPORT   PROCEDURE:  Surveillance colonoscopy; colonoscopy with biopsy.   ENDOSCOPIST:  Gerrit Friends. Rourk, M.D.   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  She had a surveillance exam in 2002 and nothing was fond  except for pancolonic diverticula.  She is heal for surveillance.  She does  have a tendency towards diarrhea having 2-3 loose stools daily if she does  not take Vicodin, but she really takes Vicodin, pretty much, on a regular  basis for chronic low back pain and is more or less constipated, but does  not take a stool softener.  On balance, she has a fairly normal bowel  function, having 1-2 formed stools daily with concomitant Vicodin therapy.  Colonoscopy is now being done.  This approach has been discussed with the  patient at length.  The potential risks, benefits, and alternatives have  been reviewed; questions answered.  She is agreeable.  Please see the  documentation in the medical record.   PROCEDURE NOTE:  O2 saturation, blood pressure, pulse and respirations were  monitored throughout the entire procedure.   CONSCIOUS SEDATION:  Versed 5 mg IV, Demerol 100 mg IV in divided doses.   INSTRUMENT:  Olympus video chip system.   FINDINGS:  A digital rectal exam revealed no abnormalities.  The anus was  tender to palpation. Some viscus Xylocaine was instilled in the anal canal.   ENDOSCOPIC FINDINGS:  The prep was adequate.   RECTUM:  Examination of the rectal mucosa including the retroflex view of  the anal  verge revealed no abnormalities.   COLON:  The colonic mucosa was surveyed from the rectosigmoid junction  through the left transverse and right colon to the area of the appendiceal  orifice, ileocecal valve, and cecum. These structures were well seen and  photographed for the record.   From this level the scope was slowly and cautiously withdrawn.  All  previously mentioned mucosal surfaces were again seen.   The following abnormalities were noted:  1.  Pancolonic diverticula.  2.  Diminutive 3-mm polyp at 30 cm cold biopsied/removed.  3.  In the mid descending colon there appeared to be a single deep ulcer      buried in intersecting folds, please see photos.  This appeared to be      scope trauma related.  There was some mucosal edema and erythema.  It      was very hard to tease away with redundant folds to see the base, but      there appeared to be an inflammatory process in the center.  It did not  appear to be neoplastic.  Biopsy forceps were used to take biopsies.      Otherwise the colonic mucosa appeared to be normal.   The patient tolerated the procedure well and was reacted in endoscopy.   CECAL WITHDRAWAL TIME:  20 minutes.   IMPRESSION:  1.  Normal rectum.  2.  Diminutive polyp at 30 cm cold biopsied/removed.  3.  Apparent small, benign-appearing ulcer in the mid descending colon,      biopsied.  4.  Pancolonic diverticula.   RECOMMENDATIONS:  1.  Diverticulosis literature provided to Ms. Haislip.  2.  No aspirin or arthritis medications for the next 10 days.  3.  Follow up on path.  4.  Further recommendations to follow.      Jonathon Bellows, M.D.  Electronically Signed     RMR/MEDQ  D:  08/05/2005  T:  08/05/2005  Job:  [id number]   cc:   Angus G. Renard Matter, MD  Fax: [phone]

## 2010-11-23 NOTE — Assessment & Plan Note (Signed)
[facility]                               PULMONARY OFFICE NOTE   NAME:[name], [name]                      MRN:          [id number]  DATE:02/25/2006                            DOB:          29-Apr-1946   PULMONARY/SUMMARY EXTENDED FOLLOWUP OFFICE VISIT:   HISTORY:  [age]-year-old black female with moderate obesity and evidence for  classic voice fatigue/pseudoasthma when I saw on July 5, and contemplated  the diagnosis of ACE inhibitor intolerance plus reflux.  Despite addressing  both of these issues, she returns today stating she has not been able to get  back to walking because she gets too short of breath in the heat.  However, she denies any cough and overall is better.   PHYSICAL EXAMINATION:  GENERAL:  She is a pleasant ambulatory black female  in no acute distress.  VITAL SIGNS:  Normal.  HEENT:  Unremarkable.  Pharynx is clear.  LUNGS:  Lung field are perfectly clear bilaterally to auscultation and  percussion.  HEART:  Regular rate and rhythm without murmur, gallop, or rub.  ABDOMEN:  Soft and benign.  EXTREMITIES:  Warm without calf tenderness, cyanosis, clubbing, or edema.   Hemosaturation is 100% on room air.   IMPRESSION:  1. Unexplained dyspnea in this patient who dates all of her problems back      to endotracheal intubation but has no obvious stridor and only mild      inspiratory plateau by PFTs that were reviewed with her from January 09, 2006.   At this point, I think it makes sense to go ahead and request a CT scan of  the chest and neck and then proceed with a CPST if there is no obvious  explanation for her dyspnea.  I will schedule this.  1. Complex polypharmacy.  The more I talked to the patient about her      medicines, the more confused I became, exactly which medicines she      takes and when she takes them.  I would like to see her back after her      CT scan of the chest and neck for full medication  reconciliation and      then proceed with a CPST if there is no clear-cut explanation for her      dyspnea.  25 minutes extra discussion time regarding symptom evaluation and  management.                                   Charlaine Dalton. Sherene Sires, MD, [facility]   MBW/MedQ  DD:  02/25/2006 DT:  02/26/2006 Job #:  [id number]   cc:   Angus G. Renard Matter, MD

## 2010-11-23 NOTE — Assessment & Plan Note (Signed)
[facility]                               PULMONARY OFFICE NOTE   NAME:[name], [name]                      MRN:          [id number]  DATE:04/10/2006                            DOB:          07-14-1945    HISTORY:  A 65 year old white female with progressive dyspnea since neck  surgery in 2006, with evidence of an neck CT scan of upper airway tissue  swelling for which she has been seen by Dr. Annalee Genta and is now under Dr.  Thurmon Fair care.  She denies any exertional chest pain, orthopnea, PND, or  leg swelling.   She returns having had ACE inhibitors eliminated from her regimen on Nov 27, 2005, all smiles doing much better with no significant dyspnea, chest  pain, orthopnea, PND, or leg swelling.   PHYSICAL EXAMINATION:  GENERAL:  She is a pleasant, ambulatory, white female  in no acute distress.  VITAL SIGNS:  She is afebrile with normal vital signs.  HEENT:  Unremarkable.  Oropharynx is clear.  LUNGS:  Fields are completely clear bilaterally on auscultation percussion.  HEART:  There is a regular rhythm without murmur, gallop, or rub.  ABDOMEN:  Soft, benign.  EXTREMITIES:  Warm without calf tenderness, cyanosis, clubbing, edema.   IMPRESSION:  Upper airway obstruction secondary to tissue swelling,  probably the residual of ACE inhibitor effect plus chronic reflux and is now  under the care of Dr. Annalee Genta.  I would avoid ACE inhibitors in this  setting and continue her for now on Exforge 10/160, one daily.  No further  pulmonary followup is needed though.   I have reviewed each and every one of her maintenance versus p.r.n.  medicines and asked her to review the list that we made for her today with  Dr. Renard Matter and I will defer to Dr. Renard Matter for  other choice regarding antihypertensives but would avoid again the ACE  inhibitor class give her history of neck surgery and unexplained soft tissue  swelling of the upper airway.          ______________________________  Charlaine Dalton. Sherene Sires, MD, [facility]      MBW/MedQ  DD:  04/10/2006  DT:  04/11/2006  Job #:  [id number]   cc:   Onalee Hua L. Annalee Genta, M.D.  Angus G. Renard Matter, MD

## 2010-11-23 NOTE — Procedures (Signed)
[name], [name]               ACCOUNT NO.:  [id number]   MEDICAL RECORD NO.:  [id number]          PATIENT TYPE:  REC   LOCATION:                                FACILITY:  [facility]   PHYSICIAN:  Vida Roller, M.D.   DATE OF BIRTH:  [birth year]   DATE OF PROCEDURE:  03/27/2005  DATE OF DISCHARGE:                                  ECHOCARDIOGRAM   TAPE NUMBER:  [id number]   TAPE COUNT:  [id number]   CLINICAL INFORMATION:  This is a 65 year old woman with chest discomfort.  Technical quality of the study is adequate.   PREVIOUS STUDY:   M-MODE:  AORTA:  28 mm  (<4.0)  LEFT ATRIUM:  38 mm  (<4.0)  SEPTUM:  13 mm  (0.7-1.1)  POSTERIOR WALL:  13 mm  (0.7-1.1)  LV-DIASTOLE:  35 mm  (<5.7)  LV-SYSTOLE:  19 mm  (<4.0)  E-SEPTAL:  (<0.5)  RV-DIASTOLE:  (<2.5)  IVC:  (<2.0)   TWO-DIMENSIONAL AND DOPPLER IMAGING:  The left ventricle is normal size.  There is vigorous LV systolic function with an estimated ejection fraction  of 75-80%.  There is mild concentric left ventricular hypertrophy.  There  are no wall motion abnormalities seen.   Right ventricle is top normal in size.  Normal systolic function.   Both atria appear to be normal size.   The aortic valve is unremarkable with no stenosis or regurgitation.   Mitral valve without stenosis or regurgitation.   The tricuspid valve with trace insufficiency.   No pericardial effusion.   The ascending aorta was not well seen.   The inferior vena cava appears to be normal size.      Vida Roller, M.D.  Electronically Signed     JH/MEDQ  D:  03/27/2005  T:  03/27/2005  Job:  [id number]

## 2010-12-19 ENCOUNTER — Encounter: Payer: Self-pay | Admitting: Cardiovascular Disease

## 2010-12-19 ENCOUNTER — Encounter: Payer: Self-pay | Admitting: *Deleted

## 2010-12-19 ENCOUNTER — Ambulatory Visit (INDEPENDENT_AMBULATORY_CARE_PROVIDER_SITE_OTHER): Payer: Medicare Other | Admitting: Cardiovascular Disease

## 2010-12-19 DIAGNOSIS — I10 Essential (primary) hypertension: Secondary | ICD-10-CM

## 2010-12-19 DIAGNOSIS — E782 Mixed hyperlipidemia: Secondary | ICD-10-CM

## 2010-12-19 DIAGNOSIS — R079 Chest pain, unspecified: Secondary | ICD-10-CM

## 2010-12-19 NOTE — Patient Instructions (Signed)
Your physician has requested that you have a stress echocardiogram. For further information please visit [email or web address]. Please follow instruction sheet as given.   

## 2010-12-19 NOTE — Progress Notes (Signed)
[name] is seen today at the request of [facility]. She has previously been seen by Dr Dietrich Pates and Dorethea Clan. She has had a normal echo and myovue I believe in 2009. 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 2011 She was seen in the ER at [facility] and R/O with no acute ECG changes.[name] Her BP was elevated and she is on chronic Rx for this. She is mostly compliant with her meds. She has some chronic neck and shoulder pain with previous anterior cervical fusion. I dont think this is related to her heart. Some nonexertional sharp pain anterior chest.  Fleeting with no associated diaphoresis.  Concerned that her mom had a massive heart attack  She  Has occasional palpitations and SOB which she thinks may be due to oxycodone.  , she has mild dependant edema.  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  Her husband also has poorly controlled DM and a mini stroke but won't go see the doctor.    ROS: Denies fever, malais, weight loss, blurry vision, decreased visual acuity, cough, sputum, SOB, hemoptysis, pleuritic pain, palpitaitons, heartburn, abdominal pain, melena, lower extremity edema, claudication, or rash.  All other systems reviewed and negative   General: Affect appropriate Healthy:  appears stated age HEENT: normal Neck supple with no adenopathy JVP normal no bruits no thyromegaly Lungs clear with no wheezing and good diaphragmatic motion Heart:  S1/S2 no murmur,rub, gallop or click PMI normal Abdomen: benighn, BS positve, no tenderness, no AAA no bruit.  No HSM or HJR Distal pulses intact with no bruits No edema Neuro non-focal Skin warm and dry No muscular weakness  Medications Current Outpatient Prescriptions  Medication Sig Dispense Refill  . ALPRAZolam (XANAX) 0.5 MG tablet Take 0.5 mg by mouth at bedtime as needed.        [name] aspirin 81 MG tablet Take 81 mg by mouth daily.        . diclofenac (VOLTAREN) 0.1  % ophthalmic solution 1 drop 4 (four) times daily.        [name] estradiol (CLIMARA - DOSED IN MG/24 HR) 0.05 mg/24hr Place 1 patch onto the skin once a week.       [name] EXFORGE 10-160 MG per tablet Take 1 tablet by mouth daily.       . furosemide (LASIX) 20 MG tablet Take 20 mg by mouth daily.       [name] HYDROcodone-acetaminophen (VICODIN) 5-500 MG per tablet       . KLOR-CON M20 20 MEQ tablet Take 20 mEq by mouth daily.       [name] omeprazole (PRILOSEC) 20 MG capsule Take 20 mg by mouth daily.        . rosuvastatin (CRESTOR) 5 MG tablet Take 5 mg by mouth daily.        [name] DISCONTD: cyclobenzaprine (FLEXERIL) 5 MG tablet Take 5 mg by mouth 3 (three) times daily as needed.        [name] DISCONTD: gabapentin (NEURONTIN) 100 MG capsule Take 100 mg by mouth 3 (three) times daily.          Allergies Review of patient's allergies indicates no known allergies.  Family History: Family History  Problem Relation Age of Onset  . Cancer Brother 65    gastric    Social History: History   Social History  . Marital Status: Married  Spouse Name: N/A    Number of Children: N/A  . Years of Education: N/A   Occupational History  . 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 Smoking status: Former Games developer  . Smokeless tobacco: Not on file  . Alcohol Use: Yes     maybe one a month wine  . Drug Use: No  . Sexually Active: Not on file   Other Topics Concern  . Not on file   Social History Narrative  . No narrative on file    Electrocardiogram:  NSR 79 ICRBBB no acute changes  Assessment and Plan

## 2010-12-19 NOTE — Assessment & Plan Note (Signed)
Atypical  F/U stress echo given dyspnea and ICRBBB

## 2010-12-19 NOTE — Assessment & Plan Note (Signed)
Well controlled.  Continue current medications and low sodium Dash type diet.    

## 2010-12-19 NOTE — Assessment & Plan Note (Signed)
Cholesterol is at goal.  Continue current dose of statin and diet Rx.  No myalgias or side effects.  F/U  LFT's in 6 months. Lab Results  Component Value Date   LDLCALC 78 03/07/2009

## 2010-12-25 ENCOUNTER — Ambulatory Visit (HOSPITAL_BASED_OUTPATIENT_CLINIC_OR_DEPARTMENT_OTHER): Payer: Medicare Other | Admitting: Radiology

## 2010-12-25 ENCOUNTER — Ambulatory Visit (HOSPITAL_COMMUNITY): Payer: Medicare Other | Attending: Cardiovascular Disease

## 2010-12-25 DIAGNOSIS — R072 Precordial pain: Secondary | ICD-10-CM | POA: Insufficient documentation

## 2010-12-25 DIAGNOSIS — I10 Essential (primary) hypertension: Secondary | ICD-10-CM | POA: Insufficient documentation

## 2010-12-25 DIAGNOSIS — M79609 Pain in unspecified limb: Secondary | ICD-10-CM | POA: Insufficient documentation

## 2010-12-25 DIAGNOSIS — R0989 Other specified symptoms and signs involving the circulatory and respiratory systems: Secondary | ICD-10-CM

## 2010-12-25 DIAGNOSIS — R5381 Other malaise: Secondary | ICD-10-CM | POA: Insufficient documentation

## 2010-12-25 DIAGNOSIS — E785 Hyperlipidemia, unspecified: Secondary | ICD-10-CM | POA: Insufficient documentation

## 2011-04-29 ENCOUNTER — Encounter: Payer: Self-pay | Admitting: Gastroenterology

## 2011-04-29 ENCOUNTER — Ambulatory Visit (INDEPENDENT_AMBULATORY_CARE_PROVIDER_SITE_OTHER): Payer: Medicare Other | Admitting: Gastroenterology

## 2011-04-29 VITALS — BP 122/77 | HR 73 | Temp 97.2°F | Ht 67.0 in | Wt 203.4 lb

## 2011-04-29 DIAGNOSIS — R1032 Left lower quadrant pain: Secondary | ICD-10-CM

## 2011-04-29 NOTE — Patient Instructions (Signed)
Continue taking the probiotic.  When you get home, take Miralax 1 capful. You may repeat this again if needed this evening. You may take this every day as needed for a bowel movement.  Please call us within the next 24-48 hours if no results, increased belly pain, fever, or chills. Otherwise, we will see you back in 3 months if things get back on track.

## 2011-04-29 NOTE — Progress Notes (Signed)
Referring Provider: Evlyn Courier, MD Primary Care Physician:  Evlyn Courier, MD  Chief Complaint  Patient presents with  . Constipation  . Abdominal Pain    left lower side is hurting    HPI:   [name] returns today in f/u, last seen in May 2012 here at our office. She was dealing with diarrhea at that time. Returns today with constipation. Ate a cup of prunes last night and had a small movement. Was having stomach cramping, gassiness, nausea, starting last Wednesday after eating fried fish.  Before all this happened would have BM every day to twice per day. Reports now lots of gas. Reports LLQ pain, so severe she couldn't move last Thursday. Pain described as sharp, felt like something running around in stomach. The LLQ pain is constant. No fever or chills. Decreased po intake, eats something in the morning, won't eat again till night. When eats makes pain worse. She does not appear acutely ill at this time. Well-dressed, in good spirits, no signs of distress. She does have a hx of chronic back pain, as well as chronic left-sided pain in the past. She actually was referred to Dr. Derrell Lolling last year for evaluation of left flank retroperitoneal hernia containing fat; however, he felt it was best to avoid surgery as most likely pain not stemming from this. He advised she follow up with back issues.   Past Medical History  Diagnosis Date  . IBS (irritable bowel syndrome)     diarrhea predominant  . GERD (gastroesophageal reflux disease)     negative H pylori stool Ag  . HTN (hypertension)   . Chronic back pain   . Tubular adenoma of colon   . Hemorrhoids 11/08/08    Colonoscopy Dr Rourk->hyperplastic polyp, benign bx  . Diverticulosis   . Umbilical hernia     Past Surgical History  Procedure Date  . Cholecystectomy   . Nephrectomy     left  . Cervical disc surgery   . Ectopic pregnancy surgery     bilat  . Appendectomy   . Abdominal hysterectomy     Current Outpatient  Prescriptions  Medication Sig Dispense Refill  . ALPRAZolam (XANAX) 0.5 MG tablet Take 0.5 mg by mouth at bedtime as needed.        [name] aspirin 81 MG tablet Take 81 mg by mouth daily.        [name] estradiol (CLIMARA - DOSED IN MG/24 HR) 0.05 mg/24hr Place 1 patch onto the skin once a week.       [name] EXFORGE 10-160 MG per tablet Take 1 tablet by mouth daily.       . furosemide (LASIX) 20 MG tablet Take 20 mg by mouth daily.       [name] HYDROcodone-acetaminophen (VICODIN) 5-500 MG per tablet Take 1 tablet by mouth every 4 (four) hours as needed.       [name] KLOR-CON M20 20 MEQ tablet Take 20 mEq by mouth daily.       [name] omeprazole (PRILOSEC) 20 MG capsule Take 20 mg by mouth daily.        . rosuvastatin (CRESTOR) 5 MG tablet Take 5 mg by mouth daily.        . diclofenac (VOLTAREN) 0.1 % ophthalmic solution 1 drop 4 (four) times daily.          Allergies as of 04/29/2011  . (No Known Allergies)    Family History  Problem Relation Age of Onset  . Cancer Brother 86  gastric    History   Social History  . Marital Status: Married    Spouse Name: N/A    Number of Children: N/A  . Years of Education: N/A   Occupational History  . 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 Smoking status: Former Games developer  . Smokeless tobacco: None  . Alcohol Use: Yes     maybe one a month wine  . Drug Use: No  . Sexually Active: None   Other Topics Concern  . None   Social History Narrative  . None    Review of Systems: Gen: Denies fever, chills, anorexia. Denies fatigue, weakness, weight loss.  CV: Denies chest pain, palpitations, syncope, peripheral edema, and claudication. Resp: Denies dyspnea at rest, cough, wheezing, coughing up blood, and pleurisy. GI: Denies vomiting blood, jaundice, and fecal incontinence.   Denies dysphagia or odynophagia. Derm: Denies rash, itching, dry skin Psych: Denies depression, anxiety, memory loss, confusion. No homicidal or suicidal ideation.  Heme: Denies  bruising, bleeding, and enlarged lymph nodes.  Physical Exam: BP 122/77  Pulse 73  Temp(Src) 97.2 F (36.2 C) (Temporal)  Ht 5\' 7"  (1.702 m)  Wt 203 lb 6.4 oz (92.262 kg)  BMI 31.86 kg/m2 General:   Alert and oriented. No distress noted. Pleasant and cooperative.  Head:  Normocephalic and atraumatic. Eyes:  Conjuctiva clear without scleral icterus. Mouth:  Oral mucosa pink and moist. Good dentition. No lesions. Neck:  Supple, without mass or thyromegaly. Heart:  S1, S2 present without murmurs, rubs, or gallops. Regular rate and rhythm. Abdomen:  +BS, soft, non-tender and non-distended. No rebound or guarding. No HSM or masses noted. Msk:  Symmetrical without gross deformities. Normal posture. Extremities:  Without edema. Neurologic:  Alert and  oriented x4;  grossly normal neurologically. Skin:  Intact without significant lesions or rashes. Cervical Nodes:  No significant cervical adenopathy. Psych:  Alert and cooperative. Normal mood and affect.

## 2011-05-01 NOTE — Assessment & Plan Note (Signed)
65 year old female with somewhat chronic hx of left-sided abdominal pain. She is now dealing with constipation, and I highly doubt we are dealing with another etiology such as uncomplicated diverticulitis. She has chronic back issues, and she has also been seen by Dr. Derrell Lolling for consideration of left-flank retroperitoneal fat-containing hernia. It was advised to avoid surgical intervention, for pt's symptoms seem to be multifactorial and actually r/t more to chronic back pain. I spent a good deal of time with her discussing how chronic back pain can affect abdominal pain; we actually looked at a chart showing the lumbar region and nerve innervation. I believe her current symptoms are compounded by constipation. I have asked that she take Miralax when she goes home, repeat if necessary. She is to take daily if needed. Continue taking probiotic. She is to call us if no improvement within next 24-48 hours. We will see her back in 3 months or sooner if indicated. Signs/symptoms to report such as fever/chills, increased abdominal pain, n/v, were discussed in detail with pt.

## 2011-05-02 NOTE — Progress Notes (Signed)
Cc to PCP 

## 2011-07-09 ENCOUNTER — Encounter (HOSPITAL_COMMUNITY): Payer: Self-pay

## 2011-07-09 ENCOUNTER — Emergency Department (HOSPITAL_COMMUNITY)
Admission: EM | Admit: 2011-07-09 | Discharge: 2011-07-09 | Disposition: A | Discharge status: Home / Self Care | Payer: Medicare Other | Attending: Emergency Medicine | Admitting: Emergency Medicine

## 2011-07-09 DIAGNOSIS — K219 Gastro-esophageal reflux disease without esophagitis: Secondary | ICD-10-CM | POA: Insufficient documentation

## 2011-07-09 DIAGNOSIS — R070 Pain in throat: Secondary | ICD-10-CM | POA: Insufficient documentation

## 2011-07-09 DIAGNOSIS — B9789 Other viral agents as the cause of diseases classified elsewhere: Secondary | ICD-10-CM | POA: Insufficient documentation

## 2011-07-09 DIAGNOSIS — Z79899 Other long term (current) drug therapy: Secondary | ICD-10-CM | POA: Insufficient documentation

## 2011-07-09 DIAGNOSIS — R509 Fever, unspecified: Secondary | ICD-10-CM | POA: Insufficient documentation

## 2011-07-09 DIAGNOSIS — I10 Essential (primary) hypertension: Secondary | ICD-10-CM | POA: Insufficient documentation

## 2011-07-09 DIAGNOSIS — B349 Viral infection, unspecified: Secondary | ICD-10-CM

## 2011-07-09 DIAGNOSIS — R059 Cough, unspecified: Secondary | ICD-10-CM | POA: Insufficient documentation

## 2011-07-09 DIAGNOSIS — IMO0001 Reserved for inherently not codable concepts without codable children: Secondary | ICD-10-CM | POA: Insufficient documentation

## 2011-07-09 DIAGNOSIS — R05 Cough: Secondary | ICD-10-CM | POA: Insufficient documentation

## 2011-07-09 DIAGNOSIS — R220 Localized swelling, mass and lump, head: Secondary | ICD-10-CM | POA: Insufficient documentation

## 2011-07-09 DIAGNOSIS — R110 Nausea: Secondary | ICD-10-CM | POA: Insufficient documentation

## 2011-07-09 DIAGNOSIS — R6889 Other general symptoms and signs: Secondary | ICD-10-CM | POA: Insufficient documentation

## 2011-07-09 DIAGNOSIS — J3489 Other specified disorders of nose and nasal sinuses: Secondary | ICD-10-CM | POA: Insufficient documentation

## 2011-07-09 MED ORDER — GUAIFENESIN-CODEINE 100-10 MG/5ML PO SYRP: 10.0000 mL | ORAL_SOLUTION | Freq: Three times a day (TID) | ORAL | Status: AC | PRN

## 2011-07-09 MED ORDER — OSELTAMIVIR PHOSPHATE 75 MG PO CAPS: 75.0000 mg | ORAL_CAPSULE | Freq: Two times a day (BID) | ORAL | Status: AC

## 2011-07-09 NOTE — ED Provider Notes (Signed)
History     CSN: [id number]  Arrival date & time 07/09/11  1003   First MD Initiated Contact with Patient 07/09/11 1108      Chief Complaint  Patient presents with  . Influenza    (Consider location/radiation/quality/duration/timing/severity/associated sxs/prior treatment) HPI Comments: Patient c/o body aches, cough, fever, chills and sore throat since waking up on the morning of arrival to the ED.  States she believes she may have been exposed to the "flu".  States she did not take in flu vaccine this year.  She denies vomiting, neck stiffness, dyspnea, chest pain , abd pain or headache.  Patient is a 66 y.o. female presenting with flu symptoms.  Influenza This is a new problem. The current episode started today. The problem occurs constantly. The problem has been unchanged. Associated symptoms include chills, congestion, coughing, a fever, myalgias, nausea and a sore throat. Pertinent negatives include no abdominal pain, arthralgias, chest pain, diaphoresis, headaches, neck pain, numbness, rash, vertigo, vomiting or weakness. The symptoms are aggravated by nothing. She has tried nothing for the symptoms. The treatment provided no relief.    Past Medical History  Diagnosis Date  . IBS (irritable bowel syndrome)     diarrhea predominant  . GERD (gastroesophageal reflux disease)     negative H pylori stool Ag  . HTN (hypertension)   . Chronic back pain   . Tubular adenoma of colon   . Hemorrhoids 11/08/08    Colonoscopy Dr Rourk->hyperplastic polyp, benign bx  . Diverticulosis   . Umbilical hernia     Past Surgical History  Procedure Date  . Cholecystectomy   . Nephrectomy     left  . Cervical disc surgery   . Ectopic pregnancy surgery     bilat  . Appendectomy   . Abdominal hysterectomy     Family History  Problem Relation Age of Onset  . Cancer Brother 20    gastric    History  Substance Use Topics  . Smoking status: Former Games developer  . Smokeless tobacco: Not on  file  . Alcohol Use: Yes     maybe one a month wine    OB History    Grav Para Term Preterm Abortions TAB SAB Ect Mult Living                  Review of Systems  Constitutional: Positive for fever and chills. Negative for diaphoresis.  HENT: Positive for congestion, sore throat, rhinorrhea and sneezing. Negative for neck pain and neck stiffness.   Eyes: Negative for visual disturbance.  Respiratory: Positive for cough.   Cardiovascular: Negative for chest pain.  Gastrointestinal: Positive for nausea. Negative for vomiting and abdominal pain.  Genitourinary: Negative for dysuria and difficulty urinating.  Musculoskeletal: Positive for myalgias. Negative for arthralgias.  Skin: Negative.  Negative for rash.  Neurological: Negative for dizziness, vertigo, weakness, numbness and headaches.  All other systems reviewed and are negative.    Allergies  Review of patient's allergies indicates no known allergies.  Home Medications   Current Outpatient Rx  Name Route Sig Dispense Refill  . ALPRAZOLAM 0.5 MG PO TABS Oral Take 0.5 mg by mouth at bedtime as needed.      . ASPIRIN 81 MG PO TABS Oral Take 81 mg by mouth daily.      [name] DICLOFENAC SODIUM 0.1 % OP SOLN  1 drop 4 (four) times daily.      [name] ESTRADIOL 0.05 MG/24HR TD PTWK Transdermal Place 1 patch onto  the skin once a week.     [name] EXFORGE 10-160 MG PO TABS Oral Take 1 tablet by mouth daily.     . FUROSEMIDE 20 MG PO TABS Oral Take 20 mg by mouth daily.     [name] HYDROCODONE-ACETAMINOPHEN 5-500 MG PO TABS Oral Take 1 tablet by mouth every 4 (four) hours as needed. pain    . KLOR-CON M20 20 MEQ PO TBCR Oral Take 20 mEq by mouth daily.     [name] OMEPRAZOLE 20 MG PO CPDR Oral Take 20 mg by mouth daily.      [name] ROSUVASTATIN CALCIUM 5 MG PO TABS Oral Take 5 mg by mouth at bedtime.       BP 139/86  Pulse 100  Temp(Src) 99.4 F (37.4 C) (Oral)  Resp 16  Ht 5\' 8"  (1.727 m)  Wt 205 lb (92.987 kg)  BMI 31.17 kg/m2  SpO2 100%  Physical  Exam  Nursing note and vitals reviewed. Constitutional: She is oriented to person, place, and time. She appears well-developed and well-nourished. No distress.  HENT:  Head: Normocephalic.  Right Ear: Tympanic membrane and ear canal normal.  Left Ear: Tympanic membrane and ear canal normal.  Nose: Mucosal edema and rhinorrhea present.  Mouth/Throat: Uvula is midline, oropharynx is clear and moist and mucous membranes are normal.  Neck: Normal range of motion. Neck supple.  Cardiovascular: Normal rate, regular rhythm and normal heart sounds.   No murmur heard. Pulmonary/Chest: Effort normal and breath sounds normal. No respiratory distress. She has no wheezes. She has no rales.  Abdominal: Soft. She exhibits no distension. There is no tenderness.  Musculoskeletal: She exhibits no edema.  Lymphadenopathy:    She has no cervical adenopathy.  Neurological: She is alert and oriented to person, place, and time. She exhibits normal muscle tone. Coordination normal.  Skin: Skin is warm and dry.    ED Course  Procedures (including critical care time)       MDM   1:31 PM patient is alert, vitals stable, no hypoxia or tachypnea. Non-toxic appearing.   Mucous membranes are moist.  Likely influenza  Patient / Family / Caregiver understand and agree with initial ED impression and plan with expectations set for ED visit.        Keri Veale L. Declyn Offield, [place] 07/11/11 1521

## 2011-07-09 NOTE — ED Notes (Signed)
C/o body aches, chills, congestion and nausea since this am.

## 2011-07-12 NOTE — ED Provider Notes (Signed)
Medical screening examination/treatment/procedure(s) were performed by non-physician practitioner and as supervising physician I was immediately available for consultation/collaboration.   Bryton Romagnoli, MD 07/12/11 1532 

## 2011-07-30 ENCOUNTER — Ambulatory Visit (INDEPENDENT_AMBULATORY_CARE_PROVIDER_SITE_OTHER): Payer: Medicare Other | Admitting: Gastroenterology

## 2011-07-30 ENCOUNTER — Ambulatory Visit: Payer: Medicare Other | Admitting: Gastroenterology

## 2011-07-30 ENCOUNTER — Encounter: Payer: Self-pay | Admitting: Gastroenterology

## 2011-07-30 DIAGNOSIS — K589 Irritable bowel syndrome without diarrhea: Secondary | ICD-10-CM

## 2011-07-30 DIAGNOSIS — K219 Gastro-esophageal reflux disease without esophagitis: Secondary | ICD-10-CM

## 2011-07-30 MED ORDER — RESTORA PO CAPS
1.0000 | ORAL_CAPSULE | Freq: Every day | ORAL | Status: DC
Start: 2011-07-30 — End: 2012-03-08

## 2011-07-30 NOTE — Progress Notes (Signed)
Primary Care Physician: Evlyn Courier, MD, MD  Primary Gastroenterologist:  Roetta Sessions, MD   Chief Complaint  Patient presents with  . Follow-up    HPI: [name] is a 66 y.o. female here for followup of gastroesophageal reflux disease and irritable bowel syndrome.  Occasionally abdominal cramps with raw veggies and usually nervous stomach on Sundays with increased stress. Will send her right to bathroom. GERD occasionally, prilosec prn helpful. Does poorly on dairy products. Flu a few weeks ago. Overall feeling well. Continues to take pain medication for chronic back pain. No melena or rectal bleeding, dysphagia, vomiting.  Takes MiraLax as needed for constipation.  Current Outpatient Prescriptions  Medication Sig Dispense Refill  . ALPRAZolam (XANAX) 0.5 MG tablet Take 0.5 mg by mouth at bedtime as needed.        [name] aspirin 81 MG tablet Take 81 mg by mouth daily.        . diclofenac (VOLTAREN) 0.1 % ophthalmic solution 1 drop 4 (four) times daily.        [name] estradiol (CLIMARA - DOSED IN MG/24 HR) 0.05 mg/24hr Place 1 patch onto the skin once a week.       [name] EXFORGE 10-160 MG per tablet Take 1 tablet by mouth daily.       . furosemide (LASIX) 20 MG tablet Take 20 mg by mouth daily.       [name] HYDROcodone-acetaminophen (VICODIN) 5-500 MG per tablet Take 1 tablet by mouth every 4 (four) hours as needed. pain      . KLOR-CON M20 20 MEQ tablet Take 20 mEq by mouth daily.       [name] omeprazole (PRILOSEC) 20 MG capsule Take 20 mg by mouth daily.        . rosuvastatin (CRESTOR) 5 MG tablet Take 5 mg by mouth at bedtime.         Allergies as of 07/30/2011  . (No Known Allergies)    ROS:  General: Negative for anorexia, weight loss, fever, chills, fatigue, weakness. ENT: Negative for hoarseness, difficulty swallowing , nasal congestion. CV: Negative for chest pain, angina, palpitations, dyspnea on exertion, peripheral edema.  Respiratory: Negative for dyspnea at rest, dyspnea on  exertion, cough, sputum, wheezing.  GI: See history of present illness. GU:  Negative for dysuria, hematuria, urinary incontinence, urinary frequency, nocturnal urination.  Endo: Negative for unusual weight change.    Physical Examination:   BP 120/82  Pulse 83  Temp(Src) 98.1 F (36.7 C) (Temporal)  Ht 5\' 7"  (1.702 m)  Wt 202 lb 6.4 oz (91.808 kg)  BMI 31.70 kg/m2  General: Well-nourished, well-developed in no acute distress.  Eyes: No icterus. Mouth: Oropharyngeal mucosa moist and pink , no lesions erythema or exudate. Lungs: Clear to auscultation bilaterally.  Heart: Regular rate and rhythm, no murmurs rubs or gallops.  Abdomen: Bowel sounds are normal, nontender, nondistended, no hepatosplenomegaly or masses, no abdominal bruits or hernia , no rebound or guarding.   Extremities: No lower extremity edema. No clubbing or deformities. Neuro: Alert and oriented x 4   Skin: Warm and dry, no jaundice.   Psych: Alert and cooperative, normal mood and affect.

## 2011-07-30 NOTE — Progress Notes (Signed)
Cc to PCP 

## 2011-07-30 NOTE — Assessment & Plan Note (Signed)
Doing well at this time. Uses omeprazole only as needed which currently seems to be working. If she has heartburn more than 3 times weekly she should take the omeprazole on a daily basis.

## 2011-07-30 NOTE — Patient Instructions (Signed)
You may use MiraLax daily for constipation as needed. You may consider taking a probiotic for 4 weeks every few months. We have provided him with some samples today. Continue Prilosec as needed. Try Levsin when he developed pain in her left lower abdomen. If the pain is from spasms of your colon it should help.  Irritable Bowel Syndrome Irritable Bowel Syndrome (IBS) is caused by a disturbance of normal bowel function. Other terms used are spastic colon, mucous colitis, and irritable colon. It does not require surgery, nor does it lead to cancer. There is no cure for IBS. But with proper diet, stress reduction, and medication, you will find that your problems (symptoms) will gradually disappear or improve. IBS is a common digestive disorder. It usually appears in late adolescence or early adulthood. Women develop it twice as often as men. CAUSES  After food has been digested and absorbed in the small intestine, waste material is moved into the colon (large intestine). In the colon, water and salts are absorbed from the undigested products coming from the small intestine. The remaining residue, or fecal material, is held for elimination. Under normal circumstances, gentle, rhythmic contractions on the bowel walls push the fecal material along the colon towards the rectum. In IBS, however, these contractions are irregular and poorly coordinated. The fecal material is either retained too long, resulting in constipation, or expelled too soon, producing diarrhea. SYMPTOMS  The most common symptom of IBS is pain. It is typically in the lower left side of the belly (abdomen). But it may occur anywhere in the abdomen. It can be felt as heartburn, backache, or even as a dull pain in the arms or shoulders. The pain comes from excessive bowel-muscle spasms and from the buildup of gas and fecal material in the colon. This pain:  Can range from sharp belly (abdominal) cramps to a dull, continuous ache.   Usually  worsens soon after eating.   Is typically relieved by having a bowel movement or passing gas.  Abdominal pain is usually accompanied by constipation. But it may also produce diarrhea. The diarrhea typically occurs right after a meal or upon arising in the morning. The stools are typically soft and watery. They are often flecked with secretions (mucus). Other symptoms of IBS include:  Bloating.   Loss of appetite.   Heartburn.   Feeling sick to your stomach (nausea).   Belching   Vomiting   Gas.  IBS may also cause a number of symptoms that are unrelated to the digestive system:  Fatigue.   Headaches.   Anxiety   Shortness of breath   Difficulty in concentrating.   Dizziness.  These symptoms tend to come and go. DIAGNOSIS  The symptoms of IBS closely mimic the symptoms of other, more serious digestive disorders. So your caregiver may wish to perform a variety of additional tests to exclude these disorders. He/she wants to be certain of learning what is wrong (diagnosis). The nature and purpose of each test will be explained to you. TREATMENT A number of medications are available to help correct bowel function and/or relieve bowel spasms and abdominal pain. Among the drugs available are:  Mild, non-irritating laxatives for severe constipation and to help restore normal bowel habits.   Specific anti-diarrheal medications to treat severe or prolonged diarrhea.   Anti-spasmodic agents to relieve intestinal cramps.   Your caregiver may also decide to treat you with a mild tranquilizer or sedative during unusually stressful periods in your life.  The important  thing to remember is that if any drug is prescribed for you, make sure that you take it exactly as directed. Make sure that your caregiver knows how well it worked for you. HOME CARE INSTRUCTIONS   Avoid foods that are high in fat or oils. Some examples FAO:ZHYQM cream, butter, frankfurters, sausage, and other fatty  meats.   Avoid foods that have a laxative effect, such as fruit, fruit juice, and dairy products.   Cut out carbonated drinks, chewing gum, and "gassy" foods, such as beans and cabbage. This may help relieve bloating and belching.   Bran taken with plenty of liquids may help relieve constipation.   Keep track of what foods seem to trigger your symptoms.   Avoid emotionally charged situations or circumstances that produce anxiety.   Start or continue exercising.   Get plenty of rest and sleep.  MAKE SURE YOU:   Understand these instructions.   Will watch your condition.   Will get help right away if you are not doing well or get worse.  Document Released: 06/24/2005 Document Revised: 03/06/2011 Document Reviewed: 02/12/2008 [facility] Patient Information 2012 [place], [place].

## 2011-07-30 NOTE — Assessment & Plan Note (Signed)
Doing reasonably well. Take Levsin as needed for left lower quadrant pain, diarrhea. May consider adding back probiotic and fiber. Samples of Restora are provided today. Office visit in one year.

## 2011-08-05 NOTE — Progress Notes (Signed)
Please let pt know: looked into raspberry ketone. Should not affect IBS/GERD.

## 2011-08-08 NOTE — Progress Notes (Signed)
Tried to call pt- LMOM 

## 2011-08-12 ENCOUNTER — Telehealth: Payer: Self-pay

## 2011-08-12 NOTE — Telephone Encounter (Signed)
Spoke with pt- she is c/o having increased cramping and gas since taking the probiotics (restora). Advised pt to stop taking it and recommended that she purchase some otc. Pt agreeable with this.

## 2011-08-12 NOTE — Progress Notes (Signed)
Pt aware, she is having a lot of cramping and gas with the probiotics.

## 2011-08-13 NOTE — Telephone Encounter (Signed)
Pt aware.

## 2011-08-13 NOTE — Telephone Encounter (Signed)
I would not think that probiotic should cause lot of cramping. She can try otc or different probiotic. Make sure she is taking levsin as needed for cramping. Call with further concerns.

## 2011-11-08 ENCOUNTER — Other Ambulatory Visit (HOSPITAL_COMMUNITY): Payer: Self-pay | Admitting: Family Medicine

## 2011-11-08 DIAGNOSIS — Z139 Encounter for screening, unspecified: Secondary | ICD-10-CM

## 2011-11-19 ENCOUNTER — Ambulatory Visit (HOSPITAL_COMMUNITY)
Admission: RE | Admit: 2011-11-19 | Discharge: 2011-11-19 | Disposition: A | Discharge status: Home / Self Care | Payer: Medicare Other | Source: Ambulatory Visit | Attending: Family Medicine | Admitting: Family Medicine

## 2011-11-19 DIAGNOSIS — Z139 Encounter for screening, unspecified: Secondary | ICD-10-CM

## 2011-11-19 DIAGNOSIS — Z1231 Encounter for screening mammogram for malignant neoplasm of breast: Secondary | ICD-10-CM | POA: Insufficient documentation

## 2011-12-13 ENCOUNTER — Ambulatory Visit: Payer: Medicare Other | Admitting: Internal Medicine

## 2011-12-20 ENCOUNTER — Encounter: Payer: Self-pay | Admitting: Internal Medicine

## 2011-12-20 ENCOUNTER — Ambulatory Visit (INDEPENDENT_AMBULATORY_CARE_PROVIDER_SITE_OTHER): Payer: Medicare Other | Admitting: Internal Medicine

## 2011-12-20 VITALS — BP 116/75 | HR 77 | Temp 98.4°F | Ht 67.0 in | Wt 197.0 lb

## 2011-12-20 DIAGNOSIS — Z1211 Encounter for screening for malignant neoplasm of colon: Secondary | ICD-10-CM

## 2011-12-20 DIAGNOSIS — Z86010 Personal history of colonic polyps: Secondary | ICD-10-CM

## 2011-12-20 DIAGNOSIS — K589 Irritable bowel syndrome without diarrhea: Secondary | ICD-10-CM

## 2011-12-20 DIAGNOSIS — K219 Gastro-esophageal reflux disease without esophagitis: Secondary | ICD-10-CM

## 2011-12-20 NOTE — Patient Instructions (Signed)
Office visit 1 year  Use Prilosec as needed  Consider regular use of probiotic  Colonoscopy 2015

## 2011-12-20 NOTE — Progress Notes (Signed)
Primary Care Physician:  Evlyn Courier, MD Primary Gastroenterologist:  Dr. Jena Gauss    Pre-Procedure History & Physical: HPI:  [name] is a 66 y.o. female here for here for followup of irritable bowel syndrome-D and GERD. Doing well these days. Came off hydrocodone now taking tramadol for pain per Dr. Loleta Chance.   No recent abdominal pain bowel movements-formed-daily.  No rectal bleeding. Distant history of colonic adenoma; hyperplastic polyp in 2010.  Early having reflux symptoms these days. Takes Prilosec periodically. Take a probiotic periodically as well. Not taking Levsin. Getting regular exercise via [employer] water [employer].  Past Medical History  Diagnosis Date  . IBS (irritable bowel syndrome)     diarrhea predominant  . GERD (gastroesophageal reflux disease)     negative H pylori stool Ag  . HTN (hypertension)   . Chronic back pain   . Tubular adenoma of colon   . Hemorrhoids 11/08/08    Colonoscopy Dr Khaliah Barnick->hyperplastic polyp, benign bx. Due 11/2013 for next tcs.  . Diverticulosis   . Umbilical hernia   . Hyperplastic colon polyp 11/08/08    Past Surgical History  Procedure Date  . Cholecystectomy   . Nephrectomy     left  . Cervical disc surgery   . Ectopic pregnancy surgery     bilat  . Appendectomy   . Abdominal hysterectomy   . Colonoscopy 11/08/08    Dr. Jena Gauss- hemorrhoids, pancolonic diverticula,,hyperplastic polyp    Prior to Admission medications   Medication Sig Start Date End Date Taking? Authorizing Provider  ALPRAZolam Prudy Feeler) 0.5 MG tablet Take 0.5 mg by mouth at bedtime as needed.     Yes Historical Provider, MD  aspirin 81 MG tablet Take 81 mg by mouth daily.     Yes Historical Provider, MD  diclofenac (VOLTAREN) 0.1 % ophthalmic solution 1 drop 4 (four) times daily.     Yes Historical Provider, MD  estradiol (CLIMARA - DOSED IN MG/24 HR) 0.05 mg/24hr Place 1 patch onto the skin once a week.  11/11/10  Yes Historical Provider, MD  EXFORGE 10-160 MG per tablet  Take 1 tablet by mouth daily.  11/06/10  Yes Historical Provider, MD  furosemide (LASIX) 20 MG tablet Take 20 mg by mouth daily.  11/13/10  Yes Historical Provider, MD  KLOR-CON M20 20 MEQ tablet Take 20 mEq by mouth daily.  11/13/10  Yes Historical Provider, MD  omeprazole (PRILOSEC) 20 MG capsule Take 20 mg by mouth daily.     Yes Historical Provider, MD  Probiotic Product (RESTORA) CAPS Take 1 capsule by mouth daily. 07/30/11  Yes Tiffany Kocher, PA  rosuvastatin (CRESTOR) 5 MG tablet Take 5 mg by mouth at bedtime.    Yes Historical Provider, MD  traMADol (ULTRAM) 50 MG tablet Take 50 mg by mouth every 6 (six) hours as needed.  12/07/11  Yes Historical Provider, MD  HYDROcodone-acetaminophen (VICODIN) 5-500 MG per tablet Take 1 tablet by mouth every 4 (four) hours as needed. pain 10/20/10   Historical Provider, MD    Allergies as of 12/20/2011  . (No Known Allergies)    Family History  Problem Relation Age of Onset  . Cancer Brother 3    gastric    History   Social History  . Marital Status: Married    Spouse Name: N/A    Number of Children: N/A  . Years of Education: N/A   Occupational History  . 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 Smoking  status: Former Games developer  . Smokeless tobacco: Not on file  . Alcohol Use: Yes     maybe one a month wine  . Drug Use: No  . Sexually Active: Not on file   Other Topics Concern  . Not on file   Social History Narrative  . No narrative on file    Review of Systems: See HPI, otherwise negative ROS  Physical Exam: BP 116/75  Pulse 77  Temp 98.4 F (36.9 C) (Temporal)  Ht 5\' 7"  (1.702 m)  Wt 197 lb (89.359 kg)  BMI 30.85 kg/m2 General:   Alert,  Well-developed, well-nourished, pleasant and cooperative in NAD Skin:  Intact without significant lesions or rashes. Eyes:  Sclera clear, no icterus.   Conjunctiva pink. Ears:  Normal auditory acuity. Nose:  No deformity, discharge,  or lesions. Mouth:  No deformity or  lesions. Neck:  Supple; no masses or thyromegaly. No significant cervical adenopathy. Lungs:  Clear throughout to auscultation.   No wheezes, crackles, or rhonchi. No acute distress. Heart:  Regular rate and rhythm; no murmurs, clicks, rubs,  or gallops. Abdomen: Non-distended, normal bowel sounds.  Soft and nontender without appreciable mass or hepatosplenomegaly.  Pulses:  Normal pulses noted. Extremities:  Without clubbing or edema.  Impression/Plan:  Pleasant 66 year old lady with IBS-D. and GERD. Symptoms fairly quiescent at this time.  History of colonic adenoma-due for surveillance colonoscopy 2015.  Recommendations: Continue antireflux lifestyle/diet. May use Prilosec for brief periods on a when necessary basis as long as no frequent reflux symptoms. Consider regular use of probiotic. Discussed the benign but chronic nature of irritable bowel syndrome today.  Unless something comes up, we'll plan for surveillance colonoscopy 2015. Office visit here in one year.

## 2012-02-05 ENCOUNTER — Other Ambulatory Visit: Payer: Self-pay | Admitting: Neurosurgery

## 2012-02-05 DIAGNOSIS — M542 Cervicalgia: Secondary | ICD-10-CM

## 2012-02-08 ENCOUNTER — Ambulatory Visit
Admission: RE | Admit: 2012-02-08 | Discharge: 2012-02-08 | Disposition: A | Discharge status: Home / Self Care | Payer: Medicare Other | Source: Ambulatory Visit | Attending: Neurosurgery | Admitting: Neurosurgery

## 2012-02-08 DIAGNOSIS — M542 Cervicalgia: Secondary | ICD-10-CM

## 2012-03-08 ENCOUNTER — Emergency Department (HOSPITAL_COMMUNITY): Payer: Medicare Other

## 2012-03-08 ENCOUNTER — Encounter (HOSPITAL_COMMUNITY): Payer: Self-pay | Admitting: *Deleted

## 2012-03-08 ENCOUNTER — Observation Stay (HOSPITAL_COMMUNITY)
Admission: EM | Admit: 2012-03-08 | Discharge: 2012-03-09 | Disposition: A | Discharge status: Home / Self Care | Payer: Medicare Other | Attending: Internal Medicine | Admitting: Internal Medicine

## 2012-03-08 DIAGNOSIS — K589 Irritable bowel syndrome without diarrhea: Secondary | ICD-10-CM | POA: Diagnosis present

## 2012-03-08 DIAGNOSIS — R079 Chest pain, unspecified: Principal | ICD-10-CM | POA: Insufficient documentation

## 2012-03-08 DIAGNOSIS — R109 Unspecified abdominal pain: Secondary | ICD-10-CM

## 2012-03-08 DIAGNOSIS — I10 Essential (primary) hypertension: Secondary | ICD-10-CM | POA: Diagnosis present

## 2012-03-08 DIAGNOSIS — R197 Diarrhea, unspecified: Secondary | ICD-10-CM

## 2012-03-08 DIAGNOSIS — R1032 Left lower quadrant pain: Secondary | ICD-10-CM

## 2012-03-08 DIAGNOSIS — D126 Benign neoplasm of colon, unspecified: Secondary | ICD-10-CM

## 2012-03-08 DIAGNOSIS — K219 Gastro-esophageal reflux disease without esophagitis: Secondary | ICD-10-CM | POA: Diagnosis present

## 2012-03-08 DIAGNOSIS — M549 Dorsalgia, unspecified: Secondary | ICD-10-CM

## 2012-03-08 DIAGNOSIS — R0789 Other chest pain: Secondary | ICD-10-CM

## 2012-03-08 DIAGNOSIS — E782 Mixed hyperlipidemia: Secondary | ICD-10-CM | POA: Diagnosis present

## 2012-03-08 DIAGNOSIS — Z86010 Personal history of colonic polyps: Secondary | ICD-10-CM

## 2012-03-08 DIAGNOSIS — R609 Edema, unspecified: Secondary | ICD-10-CM

## 2012-03-08 LAB — TROPONIN I
Troponin I: <0.3 ng/mL (ref <0.30)
Troponin I: <0.3 ng/mL (ref <0.30)
Troponin I: <0.3 ng/mL (ref <0.30)

## 2012-03-08 LAB — CBC
HCT: 40 % (ref 36.0–46.0)
Hemoglobin: 13.2 g/dL (ref 12.0–15.0)
MCH: 29.1 pg (ref 26.0–34.0)
MCHC: 33 g/dL (ref 30.0–36.0)
MCV: 88.3 fL (ref 78.0–100.0)
Platelets: 254 10*3/uL (ref 150–400)
RBC: 4.53 MIL/uL (ref 3.87–5.11)
RDW: 12.6 % (ref 11.5–15.5)
WBC: 6 10*3/uL (ref 4.0–10.5)

## 2012-03-08 LAB — BASIC METABOLIC PANEL
BUN: 13 mg/dL (ref 6–23)
CO2: 23 mEq/L (ref 19–32)
Calcium: 9.6 mg/dL (ref 8.4–10.5)
Chloride: 103 mEq/L (ref 96–112)
Creatinine, Ser: 0.91 mg/dL (ref 0.50–1.10)
GFR calc Af Amer: 75 mL/min — ABNORMAL LOW (ref >90)
GFR calc non Af Amer: 64 mL/min — ABNORMAL LOW (ref >90)
Glucose, Bld: 117 mg/dL — ABNORMAL HIGH (ref 70–99)
Potassium: 4.1 mEq/L (ref 3.5–5.1)
Sodium: 136 mEq/L (ref 135–145)

## 2012-03-08 LAB — D-DIMER, QUANTITATIVE: D-Dimer, Quant: <0.22 ug/mL-FEU (ref 0.00–0.48)

## 2012-03-08 MED ORDER — NITROGLYCERIN 0.4 MG SL SUBL
0.4000 mg | SUBLINGUAL_TABLET | SUBLINGUAL | Status: DC | PRN
  Administered 2012-03-08: 0.4 mg via SUBLINGUAL
  Filled 2012-03-08: qty 25

## 2012-03-08 MED ORDER — AMLODIPINE BESYLATE 5 MG PO TABS
10.0000 mg | ORAL_TABLET | Freq: Every day | ORAL | Status: DC
  Administered 2012-03-09: 10 mg via ORAL
  Filled 2012-03-08: qty 2

## 2012-03-08 MED ORDER — SENNOSIDES-DOCUSATE SODIUM 8.6-50 MG PO TABS
1.0000 | ORAL_TABLET | Freq: Every evening | ORAL | Status: DC | PRN
  Filled 2012-03-08: qty 1

## 2012-03-08 MED ORDER — ONDANSETRON HCL 4 MG/2ML IJ SOLN: 4.0000 mg | Freq: Four times a day (QID) | INTRAMUSCULAR | Status: DC | PRN

## 2012-03-08 MED ORDER — IRBESARTAN 150 MG PO TABS
150.0000 mg | ORAL_TABLET | Freq: Every day | ORAL | Status: DC
  Administered 2012-03-09: 150 mg via ORAL
  Filled 2012-03-08 (×2): qty 1

## 2012-03-08 MED ORDER — ASPIRIN 81 MG PO CHEW
324.0000 mg | CHEWABLE_TABLET | Freq: Once | ORAL | Status: AC
  Administered 2012-03-08: 324 mg via ORAL
  Filled 2012-03-08: qty 4

## 2012-03-08 MED ORDER — AMLODIPINE BESYLATE-VALSARTAN 10-160 MG PO TABS: 1.0000 | ORAL_TABLET | Freq: Every morning | ORAL | Status: DC

## 2012-03-08 MED ORDER — PANTOPRAZOLE SODIUM 40 MG PO TBEC: 40.0000 mg | DELAYED_RELEASE_TABLET | Freq: Every day | ORAL | Status: DC

## 2012-03-08 MED ORDER — SODIUM CHLORIDE 0.9 % IJ SOLN
INTRAMUSCULAR | Status: AC
  Administered 2012-03-08: 10 mL
  Filled 2012-03-08: qty 3

## 2012-03-08 MED ORDER — TRAMADOL HCL 50 MG PO TABS
ORAL_TABLET | ORAL | Status: AC
  Administered 2012-03-08: 50 mg
  Filled 2012-03-08: qty 1

## 2012-03-08 MED ORDER — SODIUM CHLORIDE 0.9 % IV SOLN: INTRAVENOUS | Status: DC

## 2012-03-08 MED ORDER — DICLOFENAC SODIUM 1 % TD GEL
1.0000 "application " | Freq: Four times a day (QID) | TRANSDERMAL | Status: DC | PRN
  Filled 2012-03-08: qty 100

## 2012-03-08 MED ORDER — ENOXAPARIN SODIUM 40 MG/0.4ML ~~LOC~~ SOLN
40.0000 mg | SUBCUTANEOUS | Status: DC
  Administered 2012-03-08: 40 mg via SUBCUTANEOUS
  Filled 2012-03-08: qty 0.4

## 2012-03-08 MED ORDER — ALPRAZOLAM 0.5 MG PO TABS
0.5000 mg | ORAL_TABLET | Freq: Two times a day (BID) | ORAL | Status: DC | PRN
  Administered 2012-03-08: 0.5 mg via ORAL
  Filled 2012-03-08: qty 1

## 2012-03-08 MED ORDER — SODIUM CHLORIDE 0.9 % IJ SOLN
3.0000 mL | Freq: Two times a day (BID) | INTRAMUSCULAR | Status: DC
  Administered 2012-03-08 – 2012-03-09 (×2): 3 mL via INTRAVENOUS
  Filled 2012-03-08 (×2): qty 3

## 2012-03-08 MED ORDER — ATORVASTATIN CALCIUM 10 MG PO TABS
10.0000 mg | ORAL_TABLET | Freq: Every day | ORAL | Status: DC
  Administered 2012-03-08: 10 mg via ORAL
  Filled 2012-03-08 (×2): qty 1

## 2012-03-08 MED ORDER — ONDANSETRON HCL 4 MG PO TABS: 4.0000 mg | ORAL_TABLET | Freq: Four times a day (QID) | ORAL | Status: DC | PRN

## 2012-03-08 MED ORDER — ASPIRIN EC 81 MG PO TBEC
81.0000 mg | DELAYED_RELEASE_TABLET | Freq: Every morning | ORAL | Status: DC
  Administered 2012-03-09: 81 mg via ORAL
  Filled 2012-03-08 (×2): qty 1

## 2012-03-08 MED ORDER — ALUM & MAG HYDROXIDE-SIMETH 200-200-20 MG/5ML PO SUSP: 30.0000 mL | Freq: Four times a day (QID) | ORAL | Status: DC | PRN

## 2012-03-08 MED ORDER — KETOROLAC TROMETHAMINE 30 MG/ML IJ SOLN
30.0000 mg | Freq: Four times a day (QID) | INTRAMUSCULAR | Status: DC | PRN
  Administered 2012-03-08: 30 mg via INTRAVENOUS
  Filled 2012-03-08: qty 1

## 2012-03-08 MED ORDER — TRAMADOL HCL 50 MG PO TABS
50.0000 mg | ORAL_TABLET | Freq: Three times a day (TID) | ORAL | Status: DC | PRN
  Administered 2012-03-09: 50 mg via ORAL
  Filled 2012-03-08: qty 1

## 2012-03-08 MED ORDER — NITROGLYCERIN 2 % TD OINT
1.0000 [in_us] | TOPICAL_OINTMENT | Freq: Once | TRANSDERMAL | Status: AC
  Administered 2012-03-08: 1 [in_us] via TOPICAL
  Filled 2012-03-08: qty 1

## 2012-03-08 NOTE — ED Notes (Signed)
Attempted to call report, nurse unable to take report at this time and will call back as soon as she can

## 2012-03-08 NOTE — H&P (Signed)
Hospital Admission Note Date: 03/08/2012  Patient name: [name] Medical record number: [id number] Date of birth: [birth year] Age: 66 y.o. Gender: female PCP: Evlyn Courier, MD  Chief Complaint: Chest pain  History of Present Illness:  [name] is an 66 y.o. female see H&P for complete admission details. The patient is a 66 year old black female who presents with sudden onset substernal chest pain while ironing. At its worst the pain was about 8/10. The pain feels like a pressure or an ache currently. Worse with movement. She had some palpitations yesterday but no other symptoms. Her pain improved after getting nitroglycerin. She has been under a lot of stress recently. She had a negative stress echo last year or with with our cardiology. She had a negative Myoview in 2006. She is a history of hypertension, hyperlipidemia. Her mother had heart disease. She has no cough, shortness of breath, dyspnea, dizziness.  Past Medical History  Diagnosis Date  . IBS (irritable bowel syndrome)     diarrhea predominant  . GERD (gastroesophageal reflux disease)     negative H pylori stool Ag  . HTN (hypertension)   . Chronic back pain   . Tubular adenoma of colon   . Hemorrhoids 11/08/08    Colonoscopy Dr Rourk->hyperplastic polyp, benign bx. Due 11/2013 for next tcs.  . Diverticulosis   . Umbilical hernia   . Hyperplastic colon polyp 11/08/08    Meds: See med rec  Allergies: Review of patient's allergies indicates no known allergies. History   Social History  . Marital Status: Married    Spouse Name: N/A    Number of Children: N/A  . Years of Education: N/A   Occupational History  . 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 Smoking status: Former Games developer  . Smokeless tobacco: Not on file  . Alcohol Use: Yes     maybe one a month wine  . Drug Use: No  . Sexually Active: Not on file   Other Topics Concern  . Not on file   Social History Narrative  . No narrative on  file   Family History  Problem Relation Age of Onset  . Cancer Brother 33    gastric   Past Surgical History  Procedure Date  . Cholecystectomy   . Nephrectomy     left  . Cervical disc surgery   . Ectopic pregnancy surgery     bilat  . Appendectomy   . Abdominal hysterectomy   . Colonoscopy 11/08/08    Dr. Jena Gauss- hemorrhoids, pancolonic diverticula,,hyperplastic polyp    Review of Systems: Systems reviewed and as per HPI, otherwise negative.  Physical Exam: Blood pressure 135/99, pulse 90, temperature 98.2 F (36.8 C), temperature source Oral, resp. rate 19, height 5\' 7"  (1.702 m), weight 87.091 kg (192 lb), SpO2 97.00%. BP 135/99  Pulse 90  Temp 98.2 F (36.8 C) (Oral)  Resp 19  Ht 5\' 7"  (1.702 m)  Wt 87.091 kg (192 lb)  BMI 30.07 kg/m2  SpO2 97%  General Appearance:    Alert, cooperative, occasionally tearful   Head:    Normocephalic, without obvious abnormality, atraumatic  Eyes:    PERRL, conjunctiva/corneas clear, EOM's intact, fundi    benign, both eyes     Nose:   Nares normal, septum midline, mucosa normal, no drainage    or sinus tenderness  Throat:   Lips, mucosa, and tongue normal; teeth and gums normal  Neck:   Supple, symmetrical, trachea midline, no  adenopathy;    thyroid:  no enlargement/tenderness/nodules; no carotid   bruit or JVD  Back:     Symmetric, no curvature, ROM normal, no CVA tenderness  Lungs:     Clear to auscultation bilaterally, respirations unlabored  Chest Wall:    No tenderness or deformity   Heart:    Regular rate and rhythm, S1 and S2 normal, no murmur, rub   or gallop     Abdomen:     Soft, non-tender, bowel sounds active all four quadrants,    no masses, no organomegaly  Genitalia:   deferred   Rectal:   deferred   Extremities:   Extremities normal, atraumatic, no cyanosis or edema  Pulses:   2+ and symmetric all extremities  Skin:   Skin color, texture, turgor normal, no rashes or lesions  Lymph nodes:   Cervical,  supraclavicular, and axillary nodes normal  Neurologic:   CNII-XII intact, normal strength, sensation and reflexes    throughout     Psychiatric: Occasionally tearful.  Lab results: Basic Metabolic Panel:  Basename 03/08/12 0958  NA 136  K 4.1  CL 103  CO2 23  GLUCOSE 117*  BUN 13  CREATININE 0.91  CALCIUM 9.6  MG --  PHOS --   Liver Function Tests: No results found for this basename: AST:2,ALT:2,ALKPHOS:2,BILITOT:2,PROT:2,ALBUMIN:2 in the last 72 hours No results found for this basename: LIPASE:2,AMYLASE:2 in the last 72 hours No results found for this basename: AMMONIA:2 in the last 72 hours CBC:  Basename 03/08/12 0958  WBC 6.0  NEUTROABS --  HGB 13.2  HCT 40.0  MCV 88.3  PLT 254   Cardiac Enzymes:  Basename 03/08/12 0958  CKTOTAL --  CKMB --  CKMBINDEX --  TROPONINI <0.30   BNP: No results found for this basename: PROBNP:3 in the last 72 hours D-Dimer:  [facility] 03/08/12 0958  DDIMER <0.22   Imaging results:  Dg Chest 2 View  03/08/2012  *RADIOLOGY REPORT*  Clinical Data: Chest pain.  CHEST - 2 VIEW  Comparison: 07/12/2011.  Findings: Normal sized heart.  Stable small linear scar in the left lower lung zone.  The interstitial markings remain minimally prominent and the lungs remain hyperexpanded.  Moderate scoliosis is stable.  Cervical spine fixation hardware.  IMPRESSION:  1.  No acute abnormality. 2.  Stable changes of COPD.   Original Report Authenticated By: Darrol Angel, M.D.    EKG:  NSR with nonspecific changes  Assessment & Plan: Principal Problem:  *CHEST PAIN UNSPECIFIED: First troponin is normal. EKG shows nothing concerning. Will monitor her overnight. Rule out MI. D-dimer normal. Could be stress related. She will not discuss her life stressors further. Continue aspirin, antihypertensives and statin. Active Problems:  HYPERTENSION  GERD  Irritable bowel syndrome  Mixed hyperlipidemia   [name] L 03/08/2012, 2:45 PM

## 2012-03-08 NOTE — ED Provider Notes (Signed)
History   This chart was scribed for Shelda Jakes, MD by Sofie Rower. The patient was seen in room APA17/APA17 and the patient's care was started at 9:49AM.     CSN: [id number]  Arrival date & time 03/08/12  0945   First MD Initiated Contact with Patient 03/08/12 [phone]      Chief Complaint  Patient presents with  . Chest Pain    (Consider location/radiation/quality/duration/timing/severity/associated sxs/prior treatment) The history is provided by the patient. No language interpreter was used.    MIDA CORY is a 66 y.o. female , with a hx of chest wall pain, who presents to the Emergency Department complaining of sudden, progressively worsening, chest pain located sub sternally at the center of the chest onset today (9:30AM). The pt reports the chest pain is a pressurized heavy pain, rated 10/10 at worst, and 5/10 at present. Modifying factors include certain movements and positions of the head which intensifies the chest pain and taking baby aspirin which provides moderate relief of the chest pain. The pt has a hx of hypertension, high cholesterol, CHF, GERD, chronic back pain, and chronic neck pain.  The pt denies cardiac catheterization in the past, allergies to medications, shortness of breath, nausea, vomiting, abdominal pain, weakness, numbness, rash, and fever.  The pt does not smoke, however, drinks alcohol occasionally.   PCP is Dr. Loleta Chance. Cardiologist is LeBower.    Past Medical History  Diagnosis Date  . IBS (irritable bowel syndrome)     diarrhea predominant  . GERD (gastroesophageal reflux disease)     negative H pylori stool Ag  . HTN (hypertension)   . Chronic back pain   . Tubular adenoma of colon   . Hemorrhoids 11/08/08    Colonoscopy Dr Rourk->hyperplastic polyp, benign bx. Due 11/2013 for next tcs.  . Diverticulosis   . Umbilical hernia   . Hyperplastic colon polyp 11/08/08    Past Surgical History  Procedure Date  . Cholecystectomy   . Nephrectomy      left  . Cervical disc surgery   . Ectopic pregnancy surgery     bilat  . Appendectomy   . Abdominal hysterectomy   . Colonoscopy 11/08/08    Dr. Jena Gauss- hemorrhoids, pancolonic diverticula,,hyperplastic polyp    Family History  Problem Relation Age of Onset  . Cancer Brother 36    gastric    History  Substance Use Topics  . Smoking status: Former Games developer  . Smokeless tobacco: Not on file  . Alcohol Use: Yes     maybe one a month wine    OB History    Grav Para Term Preterm Abortions TAB SAB Ect Mult Living                  Review of Systems  All other systems reviewed and are negative.    Allergies  Review of patient's allergies indicates no known allergies.  Home Medications   Current Outpatient Rx  Name Route Sig Dispense Refill  . ALPRAZOLAM 0.5 MG PO TABS Oral Take 0.5 mg by mouth daily as needed. For anxiety or sleep    . ASPIRIN EC 81 MG PO TBEC Oral Take 81 mg by mouth every morning.    [name] DICLOFENAC SODIUM 1 % TD GEL Topical Apply 1 application topically 4 (four) times daily as needed. For pain    . ESTRADIOL 0.05 MG/24HR TD PTWK Transdermal Place 1 patch onto the skin once a week.     [name] EXFORGE  10-160 MG PO TABS Oral Take 1 tablet by mouth every morning.     [name] FLURBIPROFEN 100 MG PO TABS Oral Take 100 mg by mouth 2 (two) times daily.    . FUROSEMIDE 20 MG PO TABS Oral Take 20 mg by mouth at bedtime.     [name] KLOR-CON M20 20 MEQ PO TBCR Oral Take 20 mEq by mouth daily. With fluid pill    . OMEPRAZOLE 20 MG PO CPDR Oral Take 20 mg by mouth daily as needed. For acid reflux    . RESTORA PO CAPS Oral Take 1 capsule by mouth daily as needed. For irregularity    . ROSUVASTATIN CALCIUM 5 MG PO TABS Oral Take 5 mg by mouth at bedtime.     . TRAMADOL HCL 50 MG PO TABS Oral Take 50 mg by mouth every 8 (eight) hours as needed. For pain      BP 141/86  Pulse 76  Temp 98.2 F (36.8 C) (Oral)  Resp 16  Ht 5\' 7"  (1.702 m)  Wt 192 lb (87.091 kg)  BMI 30.07 kg/m2   SpO2 98%  Physical Exam  Nursing note and vitals reviewed. Constitutional: She is oriented to person, place, and time. She appears well-developed and well-nourished.  HENT:  Head: Atraumatic.  Nose: Nose normal.  Mouth/Throat: Oropharynx is clear and moist.  Eyes: Conjunctivae and EOM are normal.  Neck: Normal range of motion. Neck supple.  Cardiovascular: Normal rate, regular rhythm and normal heart sounds.   No murmur heard. Pulmonary/Chest: Effort normal and breath sounds normal.  Abdominal: Soft. Bowel sounds are normal. There is no tenderness.  Musculoskeletal: Normal range of motion. She exhibits no edema.  Neurological: She is alert and oriented to person, place, and time.  Skin: Skin is warm and dry.  Psychiatric: She has a normal mood and affect. Her behavior is normal.    ED Course  Procedures (including critical care time)  DIAGNOSTIC STUDIES: Oxygen Saturation is 98% on room air, normal by my interpretation.    COORDINATION OF CARE:    10:28AM- Possible hospital admission, cardiac risk factors, and pain amangement discussed with patient. Pt agrees to treatment.   10:37AM- Application of baby aspirin at home discussed with patient. Pt agrees with treatment.   Results for orders placed during the hospital encounter of 03/08/12  TROPONIN I      Component Value Range   Troponin I <0.30  <0.30 ng/mL  BASIC METABOLIC PANEL      Component Value Range   Sodium 136  135 - 145 mEq/L   Potassium 4.1  3.5 - 5.1 mEq/L   Chloride 103  96 - 112 mEq/L   CO2 23  19 - 32 mEq/L   Glucose, Bld 117 (*) 70 - 99 mg/dL   BUN 13  6 - 23 mg/dL   Creatinine, Ser 1.61  0.50 - 1.10 mg/dL   Calcium 9.6  8.4 - 09.6 mg/dL   GFR calc non Af Amer 64 (*) >90 mL/min   GFR calc Af Amer 75 (*) >90 mL/min  CBC      Component Value Range   WBC 6.0  4.0 - 10.5 K/uL   RBC 4.53  3.87 - 5.11 MIL/uL   Hemoglobin 13.2  12.0 - 15.0 g/dL   HCT 04.5  40.9 - 81.1 %   MCV 88.3  78.0 - 100.0 fL    MCH 29.1  26.0 - 34.0 pg   MCHC 33.0  30.0 - 36.0 g/dL  RDW 12.6  11.5 - 15.5 %   Platelets 254  150 - 400 K/uL  D-DIMER, QUANTITATIVE      Component Value Range   D-Dimer, Quant <0.22  0.00 - 0.48 ug/mL-FEU   Dg Chest 2 View  03/08/2012  *RADIOLOGY REPORT*  Clinical Data: Chest pain.  CHEST - 2 VIEW  Comparison: 07/12/2011.  Findings: Normal sized heart.  Stable small linear scar in the left lower lung zone.  The interstitial markings remain minimally prominent and the lungs remain hyperexpanded.  Moderate scoliosis is stable.  Cervical spine fixation hardware.  IMPRESSION:  1.  No acute abnormality. 2.  Stable changes of COPD.   Original Report Authenticated By: Darrol Angel, M.D.     Date: 03/08/2012  Rate: 77  Rhythm: normal sinus rhythm  QRS Axis: normal  Intervals: normal  ST/T Wave abnormalities: nonspecific T wave changes  Conduction Disutrbances:none  Narrative Interpretation:   Old EKG Reviewed: none available     1. Chest pain       MDM   Or patient came in for chest pain onset this morning between 03/16/1929. Substernal described as crampy sharp was originally 10 out of 10 also had a heaviness pressure sensation to it upon arrival here the pain was 5-6/10 mostlyvenous feeling. Associated with some slight shortness of breath. She has a baseline pain to her right shoulder and arm that radiates into her right arm has been present for a long period time. Patient has not had a cardiac cath she had been seen by the [facility] cardiology any years ago. However on discussion with the hospitalist for [facility] they found that she had a stress echo done a year ago that was fairly normal.  Here in the emergency part patient was given aspirin and sublingual nitroglycerin x3 with some improvement in the pain but not complete resolution just a mild heaviness feeling in the chest now. But not completely gone. Patient placed on nitro paste first troponin negative EKG without acute changes  d-dimer was negative. Patient will be admitted to telemetry on for overnight rule out.            I personally performed the services described in this documentation, which was scribed in my presence. The recorded information has been reviewed and considered.     Shelda Jakes, MD 03/08/12 1420

## 2012-03-08 NOTE — ED Notes (Signed)
Pt also states pain increased with deep breathing, causing her to have to take shallow breaths.

## 2012-03-08 NOTE — ED Notes (Signed)
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 Pt states pain has eased up. Pt states ongoing pain to left shoulder and neck x several months which is spinal related and she is currently being treated for.

## 2012-03-08 NOTE — ED Notes (Signed)
IV removed due to pt c/o pain to IV site after starting IV fluids, IV site started swelling once fluids were started

## 2012-03-08 NOTE — Progress Notes (Signed)
Pt complaining of headache 8/10, paged Dr. Lendell Caprice.  Removed nitro paste from chest per Dr. Lendell Caprice.  Pts headache subsided afterwards.

## 2012-03-09 DIAGNOSIS — R0789 Other chest pain: Secondary | ICD-10-CM

## 2012-03-09 LAB — TROPONIN I: Troponin I: <0.3 ng/mL (ref <0.30)

## 2012-03-09 NOTE — Progress Notes (Signed)
UR chart review completed.  

## 2012-03-09 NOTE — Progress Notes (Signed)
Patient with orders to be discharge to home. Discharge instructions given, patient verbalized understanding. Patient in stable condition upon discharge. Patient left with family via private vehicle.

## 2012-03-09 NOTE — Discharge Summary (Signed)
Physician Discharge Summary  Patient ID: [name] MRN: [id number] DOB/AGE: 1945/08/06 66 y.o.  Admit date: 03/08/2012 Discharge date: 03/09/2012  Discharge Diagnoses:  Principal Problem:  *Musculoskeletal chest pain Active Problems:  HYPERTENSION  GERD  Irritable bowel syndrome  Mixed hyperlipidemia   Medication List  As of 03/09/2012  9:30 AM   TAKE these medications         ALPRAZolam 0.5 MG tablet   Commonly known as: XANAX   Take 0.5 mg by mouth daily as needed. For anxiety or sleep      aspirin EC 81 MG tablet   Take 81 mg by mouth every morning.      diclofenac sodium 1 % Gel   Commonly known as: VOLTAREN   Apply 1 application topically 4 (four) times daily as needed. For pain      estradiol 0.05 mg/24hr   Commonly known as: CLIMARA - Dosed in mg/24 hr   Place 1 patch onto the skin once a week.      EXFORGE 10-160 MG per tablet   Generic drug: amLODipine-valsartan   Take 1 tablet by mouth every morning.      flurbiprofen 100 MG tablet   Commonly known as: ANSAID   Take 100 mg by mouth 2 (two) times daily.      furosemide 20 MG tablet   Commonly known as: LASIX   Take 20 mg by mouth at bedtime.      KLOR-CON M20 20 MEQ tablet   Generic drug: potassium chloride SA   Take 20 mEq by mouth daily. With fluid pill      omeprazole 20 MG capsule   Commonly known as: PRILOSEC   Take 20 mg by mouth daily as needed. For acid reflux      RESTORA Caps   Take 1 capsule by mouth daily as needed. For irregularity      rosuvastatin 5 MG tablet   Commonly known as: CRESTOR   Take 5 mg by mouth at bedtime.      traMADol 50 MG tablet   Commonly known as: ULTRAM   Take 50 mg by mouth every 8 (eight) hours as needed. For pain            Discharge Orders    Future Orders Please Complete By Expires   Diet - low sodium heart healthy      Activity as tolerated - No restrictions         Follow-up Information    Follow up with HILL,GERALD K, MD. (If symptoms  worsen)    Contact information:   [address] [place] [address] [phone]          Disposition: 01-Home or Self Care  Discharged Condition: stable  Consults:  none  Labs:   Results for orders placed during the hospital encounter of 03/08/12 (from the past 48 hour(s))  TROPONIN I     Status: Normal   Collection Time   03/08/12  9:58 AM      Component Value Range Comment   Troponin I <0.30  <0.30 ng/mL   BASIC METABOLIC PANEL     Status: Abnormal   Collection Time   03/08/12  9:58 AM      Component Value Range Comment   Sodium 136  135 - 145 mEq/L    Potassium 4.1  3.5 - 5.1 mEq/L    Chloride 103  96 - 112 mEq/L    CO2 23  19 -  32 mEq/L    Glucose, Bld 117 (*) 70 - 99 mg/dL    BUN 13  6 - 23 mg/dL    Creatinine, Ser 1.61  0.50 - 1.10 mg/dL    Calcium 9.6  8.4 - 09.6 mg/dL    GFR calc non Af Amer 64 (*) >90 mL/min    GFR calc Af Amer 75 (*) >90 mL/min   CBC     Status: Normal   Collection Time   03/08/12  9:58 AM      Component Value Range Comment   WBC 6.0  4.0 - 10.5 K/uL    RBC 4.53  3.87 - 5.11 MIL/uL    Hemoglobin 13.2  12.0 - 15.0 g/dL    HCT 04.5  40.9 - 81.1 %    MCV 88.3  78.0 - 100.0 fL    MCH 29.1  26.0 - 34.0 pg    MCHC 33.0  30.0 - 36.0 g/dL    RDW 91.4  78.2 - 95.6 %    Platelets 254  150 - 400 K/uL   D-DIMER, QUANTITATIVE     Status: Normal   Collection Time   03/08/12  9:58 AM      Component Value Range Comment   D-Dimer, Quant <0.22  0.00 - 0.48 ug/mL-FEU   TROPONIN I     Status: Normal   Collection Time   03/08/12  2:06 PM      Component Value Range Comment   Troponin I <0.30  <0.30 ng/mL   TROPONIN I     Status: Normal   Collection Time   03/09/12  5:03 AM      Component Value Range Comment   Troponin I <0.30  <0.30 ng/mL     Diagnostics:  Dg Chest 2 View  03/08/2012  *RADIOLOGY REPORT*  Clinical Data: Chest pain.  CHEST - 2 VIEW  Comparison: 07/12/2011.  Findings: Normal sized heart.  Stable small linear scar in  the left lower lung zone.  The interstitial markings remain minimally prominent and the lungs remain hyperexpanded.  Moderate scoliosis is stable.  Cervical spine fixation hardware.  IMPRESSION:  1.  No acute abnormality. 2.  Stable changes of COPD.   Original Report Authenticated By: Darrol Angel, M.D.    Procedures: none  EKG: Normal sinus rhythm with nonspecific changes  Hospital Course: See H&P for complete admission details. [name] is a pleasant 66 year old black female with history of hypertension hyperlipidemia and recurrent chest pain who presented with sudden onset chest pain and pressure. There was a sharp and pressure component. It was substernal and radiated to her scapula. Worse with movement. She has seen lobar cardiology in the past and had a negative stress Myoview in 2006. Negative stress echocardiogram last year. EKG, cardiac enzymes, d-dimer unremarkable. She was monitored overnight. Her symptoms improved with Toradol. She was artery on anti-inflammatories as an outpatient. She feels better and is medically stable for discharge.  Discharge Exam:  Blood pressure 102/68, pulse 75, temperature 98 F (36.7 C), temperature source Oral, resp. rate 18, height 5\' 7"  (1.702 m), weight 86.955 kg (191 lb 11.2 oz), SpO2 97.00%.  Gen.: Brighter. Smiling. Lungs clear to auscultation bilaterally without wheeze rhonchi or rales Cardiovascular regular rate rhythm without murmurs gallops rubs Abdomen soft nontender nondistended Extremities no clubbing cyanosis or edema Musculoskeletal: No chest wall pain  Signed: Rikki Trosper L 03/09/2012, 9:30 AM

## 2012-03-17 ENCOUNTER — Other Ambulatory Visit (HOSPITAL_COMMUNITY): Payer: Self-pay | Admitting: Family Medicine

## 2012-03-17 DIAGNOSIS — Z780 Asymptomatic menopausal state: Secondary | ICD-10-CM

## 2012-03-20 ENCOUNTER — Ambulatory Visit (HOSPITAL_COMMUNITY)
Admission: RE | Admit: 2012-03-20 | Discharge: 2012-03-20 | Disposition: A | Discharge status: Home / Self Care | Payer: Medicare Other | Source: Ambulatory Visit | Attending: Family Medicine | Admitting: Family Medicine

## 2012-03-20 DIAGNOSIS — M899 Disorder of bone, unspecified: Secondary | ICD-10-CM | POA: Insufficient documentation

## 2012-03-20 DIAGNOSIS — Z780 Asymptomatic menopausal state: Secondary | ICD-10-CM | POA: Insufficient documentation

## 2012-11-11 ENCOUNTER — Other Ambulatory Visit (HOSPITAL_COMMUNITY): Payer: Self-pay | Admitting: Family Medicine

## 2012-11-11 DIAGNOSIS — Z139 Encounter for screening, unspecified: Secondary | ICD-10-CM

## 2012-11-23 ENCOUNTER — Ambulatory Visit (HOSPITAL_COMMUNITY)
Admission: RE | Admit: 2012-11-23 | Discharge: 2012-11-23 | Disposition: A | Discharge status: Home / Self Care | Payer: Medicare PPO | Source: Ambulatory Visit | Attending: Family Medicine | Admitting: Family Medicine

## 2012-11-23 DIAGNOSIS — Z1231 Encounter for screening mammogram for malignant neoplasm of breast: Secondary | ICD-10-CM | POA: Insufficient documentation

## 2012-11-23 DIAGNOSIS — Z139 Encounter for screening, unspecified: Secondary | ICD-10-CM

## 2012-11-26 ENCOUNTER — Other Ambulatory Visit: Payer: Self-pay | Admitting: Family Medicine

## 2012-11-26 DIAGNOSIS — R928 Other abnormal and inconclusive findings on diagnostic imaging of breast: Secondary | ICD-10-CM

## 2012-11-27 ENCOUNTER — Other Ambulatory Visit: Payer: Self-pay | Admitting: Family Medicine

## 2012-11-27 DIAGNOSIS — R928 Other abnormal and inconclusive findings on diagnostic imaging of breast: Secondary | ICD-10-CM

## 2012-12-09 ENCOUNTER — Ambulatory Visit
Admission: RE | Admit: 2012-12-09 | Discharge: 2012-12-09 | Disposition: A | Discharge status: Home / Self Care | Payer: Medicare PPO | Source: Ambulatory Visit | Attending: Family Medicine | Admitting: Family Medicine

## 2012-12-09 DIAGNOSIS — R928 Other abnormal and inconclusive findings on diagnostic imaging of breast: Secondary | ICD-10-CM

## 2012-12-22 ENCOUNTER — Encounter: Payer: Self-pay | Admitting: Internal Medicine

## 2013-03-09 ENCOUNTER — Telehealth: Payer: Self-pay | Admitting: Internal Medicine

## 2013-03-09 NOTE — Telephone Encounter (Signed)
Pt called to make OV for constipation with left sided pain and is aware of OV on 9/25 with [facility]. She said the pain has gotten worse and doesn't think she can wait that long to be seen and the medicine is not helping her. Please advise what she can do or if she needs to be placed in an urgent spot? [phone]

## 2013-03-09 NOTE — Telephone Encounter (Signed)
Pt is aware of new OV on 9/9 at 0930 with AS and to disregard the previous OV made

## 2013-03-15 ENCOUNTER — Encounter: Payer: Self-pay | Admitting: Internal Medicine

## 2013-03-16 ENCOUNTER — Telehealth: Payer: Self-pay | Admitting: *Deleted

## 2013-03-16 ENCOUNTER — Encounter: Payer: Self-pay | Admitting: Gastroenterology

## 2013-03-16 ENCOUNTER — Ambulatory Visit (INDEPENDENT_AMBULATORY_CARE_PROVIDER_SITE_OTHER): Payer: Medicare PPO | Admitting: Gastroenterology

## 2013-03-16 VITALS — BP 120/78 | HR 87 | Temp 98.3°F | Ht 69.0 in | Wt 190.6 lb

## 2013-03-16 DIAGNOSIS — R109 Unspecified abdominal pain: Secondary | ICD-10-CM

## 2013-03-16 DIAGNOSIS — D126 Benign neoplasm of colon, unspecified: Secondary | ICD-10-CM

## 2013-03-16 MED ORDER — LINACLOTIDE 290 MCG PO CAPS: 290.0000 ug | ORAL_CAPSULE | Freq: Every day | ORAL | Status: DC

## 2013-03-16 NOTE — Assessment & Plan Note (Signed)
Chronic LUQ discomfort worsened with constipation and relieved thereafter. Known left flank retroperitoneal hernia, not candidate for surgical repair by Dr. Derrell Lolling in 2011. Likely chronic constipation is contributing to overall symptoms. No prior EGD, but I do not feel this would shed light on any etiology, as her pain is not associated with eating, nausea, vomiting.   More aggressive regimen for constipation is needed. Start linzess 290 mcg daily, as she failed Amitiza.  Check ifobt, consider colonoscopy if positive as she is due next year Consider repeat CT scan ?Pain management in future; likely adhesions playing a large role as well

## 2013-03-16 NOTE — Telephone Encounter (Signed)
Let's offer her the voucher for Linzess. I am sending in Linzess 290 mcg.

## 2013-03-16 NOTE — Telephone Encounter (Signed)
Routing to AS 

## 2013-03-16 NOTE — Telephone Encounter (Signed)
Pt is aware. And voucher is at the front desk.

## 2013-03-16 NOTE — Telephone Encounter (Signed)
Pt called to give her medication that Dr. Loleta Chance has gave her, RX amitiza, lubiprostone 24 MG. Please [phone]

## 2013-03-16 NOTE — Assessment & Plan Note (Signed)
Surveillance due May 2015. Check ifobt now. With worsening constipation and abdominal discomfort, could consider earlier lower GI evaluation if positive.

## 2013-03-16 NOTE — Patient Instructions (Addendum)
Please complete the stool sample and return to our office. If this is positive, we will need to proceed with a colonoscopy  Please call and let us know what medication Dr. Loleta Chance had given you to try for constipation. Then, we can decide what would be the best medication for you.  I would like to repeat a CT scan, but we will see if Dr. Loleta Chance has ordered one in the past already.

## 2013-03-16 NOTE — Progress Notes (Signed)
Referring Provider: Mirna Mires, MD Primary Care Physician:  Evlyn Courier, MD Primary GI: Dr. Jena Gauss   Chief Complaint  Patient presents with  . Abdominal Pain    on left side  . Constipation    HPI:   67 year old female presents today with history of chronic abdominal pain, IBS, GERD. Last appt with our office in June 2013. Remote history of adenoma, due for surveillance in 2015.  Notes chronic LUQ discomfort, wraps from back all the way up to the front. Pain is constant, worse with constipation. At beach a few weeks ago and almost had to go to the ED. BM every 3 days. Said she was given some type of medication from Dr. Loleta Chance, which messed her up so badly. Weak from "all the going". Tries prunes, fiber. Appears medication was Amitiza.  Pain not associated with eating. Thinks it was swollen at one time. No rectal bleeding. No melena. Doesn't take Prilosec every day, only prn. Previously evaluated by Dr. Derrell Lolling in 2011 for consideration of left flank retroperitoneal hernia repair; it was felt best to avoid surgery at that time due to possibility of pain not stemming from this. Recommendation was made to evaluate back issues further. Quite anxious, tearful, being evaluated for right hand tremor. Tired of living like this but denies suicidal ideation.   Past Medical History  Diagnosis Date  . IBS (irritable bowel syndrome)     diarrhea predominant  . GERD (gastroesophageal reflux disease)     negative H pylori stool Ag  . HTN (hypertension)   . Chronic back pain   . Tubular adenoma of colon   . Diverticulosis   . Umbilical hernia   . Hyperplastic colon polyp 11/08/08    Past Surgical History  Procedure Laterality Date  . Cholecystectomy    . Nephrectomy      left  . Cervical disc surgery    . Ectopic pregnancy surgery      bilat  . Appendectomy    . Abdominal hysterectomy    . Colonoscopy  11/08/08    Dr. Jena Gauss- hemorrhoids, pancolonic diverticula,,hyperplastic polyp  .  Colonoscopy   08/05/2005    [id number] rectum/Diminutive polyp at 30 cm/Pancolonic diverticula/benign-appearing ulcer in the mid descending colon    Current Outpatient Prescriptions  Medication Sig Dispense Refill  . ALPRAZolam (XANAX) 0.5 MG tablet Take 0.5 mg by mouth daily as needed. For anxiety or sleep      . aspirin EC 81 MG tablet Take 81 mg by mouth every morning.      . diclofenac sodium (VOLTAREN) 1 % GEL Apply 1 application topically 4 (four) times daily as needed. For pain      . estradiol (CLIMARA - DOSED IN MG/24 HR) 0.05 mg/24hr Place 1 patch onto the skin once a week.       [name] EXFORGE 10-160 MG per tablet Take 1 tablet by mouth every morning.       . flurbiprofen (ANSAID) 100 MG tablet Take 100 mg by mouth 2 (two) times daily.      . furosemide (LASIX) 20 MG tablet Take 20 mg by mouth at bedtime.       [name] KLOR-CON M20 20 MEQ tablet Take 20 mEq by mouth daily. With fluid pill      . methocarbamol (ROBAXIN) 500 MG tablet Take 250 mg by mouth daily.      [name] omeprazole (PRILOSEC) 20 MG capsule Take 20 mg by mouth daily as needed. For acid reflux      .  Probiotic Product (RESTORA) CAPS Take 1 capsule by mouth daily as needed. For irregularity      . rosuvastatin (CRESTOR) 5 MG tablet Take 5 mg by mouth at bedtime.       . traMADol (ULTRAM) 50 MG tablet Take 50 mg by mouth every 8 (eight) hours as needed. For pain      . Linaclotide (LINZESS) 290 MCG CAPS capsule Take 1 capsule (290 mcg total) by mouth daily.  30 capsule  5   No current facility-administered medications for this visit.    Allergies as of 03/16/2013  . (No Known Allergies)    Family History  Problem Relation Age of Onset  . Cancer Brother 60    gastric  . Heart attack Mother     History   Social History  . Marital Status: Married    Spouse Name: N/A    Number of Children: N/A  . Years of Education: N/A   Occupational History  . 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 Smoking status:  Former Games developer  . Smokeless tobacco: None  . Alcohol Use: No     Comment: maybe one a month wine  . Drug Use: No  . Sexual Activity: Yes   Other Topics Concern  . None   Social History Narrative  . None    Review of Systems: Negative unless mentioned in HPI.   Physical Exam: BP 120/78  Pulse 87  Temp(Src) 98.3 F (36.8 C) (Oral)  Ht 5\' 9"  (1.753 m)  Wt 190 lb 9.6 oz (86.456 kg)  BMI 28.13 kg/m2 General:   Alert and oriented. No distress noted. Pleasant but worried.  Head:  Normocephalic and atraumatic. Eyes:  Conjuctiva clear without scleral icterus. Mouth:  Oral mucosa pink and moist. Good dentition. No lesions. Heart:  S1, S2 present without murmurs, rubs, or gallops. Regular rate and rhythm. Abdomen:  +BS, soft, and non-distended. No rebound or guarding. No HSM or masses noted. TTP at surgical site from prior left nephrectomy.  Msk:  Symmetrical without gross deformities. Normal posture. Extremities:  Without edema. Neurologic:  Alert and  oriented x4;  grossly normal neurologically. Skin:  Intact without significant lesions or rashes. Psych:  Alert and cooperative. Tearful.

## 2013-03-17 ENCOUNTER — Telehealth: Payer: Self-pay | Admitting: *Deleted

## 2013-03-17 NOTE — Telephone Encounter (Signed)
I am waiting for any CT scan or imaging from Dr. Loleta Chance. If we haven't requested, let's go ahead and request any imaging. Please let her know it may take a few days to get this.

## 2013-03-17 NOTE — Telephone Encounter (Signed)
Pt called stating that anna left on her notes she would like to repeat CT scan but she was going to check to see if Dr. Loleta Chance had one already done. Please advise [phone], or [phone] -cell

## 2013-03-17 NOTE — Progress Notes (Signed)
CC'd to PCP 

## 2013-03-17 NOTE — Telephone Encounter (Signed)
We requested yesterday, I will request again

## 2013-03-17 NOTE — Telephone Encounter (Signed)
Pt is aware.  

## 2013-03-19 ENCOUNTER — Ambulatory Visit (INDEPENDENT_AMBULATORY_CARE_PROVIDER_SITE_OTHER): Payer: Medicare PPO | Admitting: Gastroenterology

## 2013-03-19 DIAGNOSIS — R109 Unspecified abdominal pain: Secondary | ICD-10-CM

## 2013-03-19 LAB — IFOBT (OCCULT BLOOD): IFOBT: POSITIVE

## 2013-03-19 NOTE — Progress Notes (Signed)
Pt returned ifobt and it was positive 

## 2013-03-23 ENCOUNTER — Other Ambulatory Visit: Payer: Self-pay | Admitting: Internal Medicine

## 2013-03-23 ENCOUNTER — Telehealth: Payer: Self-pay

## 2013-03-23 DIAGNOSIS — R109 Unspecified abdominal pain: Secondary | ICD-10-CM

## 2013-03-23 DIAGNOSIS — K921 Melena: Secondary | ICD-10-CM

## 2013-03-23 MED ORDER — PEG 3350-KCL-NA BICARB-NACL 420 G PO SOLR: 4000.0000 mL | ORAL | Status: DC

## 2013-03-23 MED ORDER — LINACLOTIDE 145 MCG PO CAPS: 145.0000 ug | ORAL_CAPSULE | Freq: Every day | ORAL | Status: DC

## 2013-03-23 NOTE — Telephone Encounter (Signed)
Yes. I have sent in Linzess 145 mcg daily. Agree with colonoscopy.

## 2013-03-23 NOTE — Telephone Encounter (Signed)
Patient is scheduled for TCS w/RMR on Tues Sept 23 at 9:45 she is aware and I have mailed her the instructions

## 2013-03-23 NOTE — Telephone Encounter (Signed)
Pt called about ifobt, informed her it was positive and per AS ov note, she needs tcs if ifobt is postive. Pt said it was ok to schedule. Leighann, please schedule tcs.   Anna-Pt only able to take linzess every 3 days, d/t diarrhea if takes it everyday. Pt still using samples and has not picked up rx from pharmacy. She wants to know if we can send in rx for linzess instead of .

## 2013-03-29 ENCOUNTER — Emergency Department (HOSPITAL_COMMUNITY)
Admission: EM | Admit: 2013-03-29 | Discharge: 2013-03-29 | Disposition: A | Discharge status: Home / Self Care | Payer: Medicare PPO | Attending: Emergency Medicine | Admitting: Emergency Medicine

## 2013-03-29 ENCOUNTER — Encounter (HOSPITAL_COMMUNITY): Payer: Self-pay | Admitting: *Deleted

## 2013-03-29 ENCOUNTER — Telehealth: Payer: Self-pay | Admitting: *Deleted

## 2013-03-29 ENCOUNTER — Encounter (HOSPITAL_COMMUNITY): Payer: Self-pay | Admitting: Pharmacy Technician

## 2013-03-29 DIAGNOSIS — R109 Unspecified abdominal pain: Secondary | ICD-10-CM

## 2013-03-29 DIAGNOSIS — Z860100 Personal history of colon polyps, unspecified: Secondary | ICD-10-CM | POA: Insufficient documentation

## 2013-03-29 DIAGNOSIS — Y929 Unspecified place or not applicable: Secondary | ICD-10-CM | POA: Insufficient documentation

## 2013-03-29 DIAGNOSIS — Z87891 Personal history of nicotine dependence: Secondary | ICD-10-CM | POA: Insufficient documentation

## 2013-03-29 DIAGNOSIS — Y939 Activity, unspecified: Secondary | ICD-10-CM | POA: Insufficient documentation

## 2013-03-29 DIAGNOSIS — T50905A Adverse effect of unspecified drugs, medicaments and biological substances, initial encounter: Secondary | ICD-10-CM

## 2013-03-29 DIAGNOSIS — K219 Gastro-esophageal reflux disease without esophagitis: Secondary | ICD-10-CM | POA: Insufficient documentation

## 2013-03-29 DIAGNOSIS — T50991A Poisoning by other drugs, medicaments and biological substances, accidental (unintentional), initial encounter: Secondary | ICD-10-CM | POA: Insufficient documentation

## 2013-03-29 DIAGNOSIS — G8929 Other chronic pain: Secondary | ICD-10-CM | POA: Insufficient documentation

## 2013-03-29 DIAGNOSIS — I10 Essential (primary) hypertension: Secondary | ICD-10-CM | POA: Insufficient documentation

## 2013-03-29 DIAGNOSIS — R197 Diarrhea, unspecified: Secondary | ICD-10-CM | POA: Insufficient documentation

## 2013-03-29 DIAGNOSIS — Z79899 Other long term (current) drug therapy: Secondary | ICD-10-CM | POA: Insufficient documentation

## 2013-03-29 DIAGNOSIS — R1013 Epigastric pain: Secondary | ICD-10-CM | POA: Insufficient documentation

## 2013-03-29 MED ORDER — SODIUM CHLORIDE 0.9 % IV BOLUS (SEPSIS)
500.0000 mL | Freq: Once | INTRAVENOUS | Status: AC
  Administered 2013-03-29: 20:00:00 via INTRAVENOUS

## 2013-03-29 MED ORDER — TRAMADOL HCL 50 MG PO TABS
50.0000 mg | ORAL_TABLET | Freq: Once | ORAL | Status: AC
  Administered 2013-03-29: 50 mg via ORAL
  Filled 2013-03-29: qty 1

## 2013-03-29 MED ORDER — ONDANSETRON HCL 4 MG/2ML IJ SOLN
4.0000 mg | Freq: Once | INTRAMUSCULAR | Status: AC
  Administered 2013-03-29: 4 mg via INTRAVENOUS
  Filled 2013-03-29: qty 2

## 2013-03-29 NOTE — Telephone Encounter (Signed)
I told the patient we recommend the 5mg  Dulcolax.  She stated she would go and get the 5 mg.

## 2013-03-29 NOTE — ED Provider Notes (Signed)
CSN: [id number]     Arrival date & time 03/29/13  1859 History   First MD Initiated Contact with Patient 03/29/13 1946     Chief Complaint  Patient presents with  . Emesis   (Consider location/radiation/quality/duration/timing/severity/associated sxs/prior Treatment) Patient is a 67 y.o. female presenting with vomiting. The history is provided by the patient.  Emesis Associated symptoms: abdominal pain and diarrhea   Associated symptoms: no headaches    patient is scheduled for colonoscopy tomorrow for positive fecal occult blood testing. At around 3:00 today she began to take her bowel prep. She states she then began to have epigastric pain that goes to entire abdomen. His cramping. She's had vomiting along with some clear diarrhea. No fevers. The pain is cramping. No blood in stool. She's a history of irritable bowel.  Past Medical History  Diagnosis Date  . IBS (irritable bowel syndrome)     diarrhea predominant  . GERD (gastroesophageal reflux disease)     negative H pylori stool Ag  . HTN (hypertension)   . Chronic back pain   . Tubular adenoma of colon   . Diverticulosis   . Umbilical hernia   . Hyperplastic colon polyp 11/08/08   Past Surgical History  Procedure Laterality Date  . Cholecystectomy    . Nephrectomy      left  . Cervical disc surgery    . Ectopic pregnancy surgery      bilat  . Appendectomy    . Abdominal hysterectomy    . Colonoscopy  11/08/08    Dr. Jena Gauss- hemorrhoids, pancolonic diverticula,,hyperplastic polyp  . Colonoscopy   08/05/2005    [id number] rectum/Diminutive polyp at 30 cm/Pancolonic diverticula/benign-appearing ulcer in the mid descending colon   Family History  Problem Relation Age of Onset  . Cancer Brother 60    gastric  . Heart attack Mother    History  Substance Use Topics  . Smoking status: Former Games developer  . Smokeless tobacco: Not on file  . Alcohol Use: No     Comment: maybe one a month wine   OB History   Grav Para Term  Preterm Abortions TAB SAB Ect Mult Living                 Review of Systems  Constitutional: Negative for activity change and appetite change.  HENT: Negative for neck stiffness.   Eyes: Negative for pain.  Respiratory: Negative for chest tightness and shortness of breath.   Cardiovascular: Negative for chest pain and leg swelling.  Gastrointestinal: Positive for nausea, vomiting, abdominal pain and diarrhea.  Genitourinary: Negative for flank pain.  Musculoskeletal: Negative for back pain.  Skin: Negative for rash.  Neurological: Negative for weakness, numbness and headaches.  Psychiatric/Behavioral: Negative for behavioral problems.    Allergies  Review of patient's allergies indicates no known allergies.  Home Medications   Current Outpatient Rx  Name  Route  Sig  Dispense  Refill  . ALPRAZolam (XANAX) 0.5 MG tablet   Oral   Take 0.5 mg by mouth daily as needed. For anxiety or sleep         . amLODipine-valsartan (EXFORGE) 10-160 MG per tablet   Oral   Take 1 tablet by mouth daily.         [name] aspirin EC 81 MG tablet   Oral   Take 81 mg by mouth every morning.         [name] estradiol (CLIMARA - DOSED IN MG/24 HR) 0.05 mg/24hr   Transdermal  Place 1 patch onto the skin once a week.          . flurbiprofen (ANSAID) 100 MG tablet   Oral   Take 100 mg by mouth 2 (two) times daily.         . furosemide (LASIX) 20 MG tablet   Oral   Take 20 mg by mouth at bedtime.          [name] Linaclotide (LINZESS) 145 MCG CAPS capsule   Oral   Take 1 capsule (145 mcg total) by mouth daily.   30 capsule   3   . methocarbamol (ROBAXIN) 500 MG tablet   Oral   Take 500 mg by mouth daily as needed (Muscle Spasms).          [name] omeprazole (PRILOSEC) 20 MG capsule   Oral   Take 20 mg by mouth daily as needed. For acid reflux         . polyethylene glycol-electrolytes (TRILYTE) 420 G solution   Oral   Take 4,000 mLs by mouth as directed.   4000 mL   0   . potassium  chloride SA (K-DUR,KLOR-CON) 20 MEQ tablet   Oral   Take 20 mEq by mouth daily.         . rosuvastatin (CRESTOR) 5 MG tablet   Oral   Take 5 mg by mouth at bedtime.          . traMADol (ULTRAM) 50 MG tablet   Oral   Take 50 mg by mouth every 8 (eight) hours as needed. For pain          BP 127/70  Pulse 70  Temp(Src) 98.3 F (36.8 C) (Oral)  Resp 18  Ht 5\' 7"  (1.702 m)  Wt 189 lb (85.73 kg)  BMI 29.59 kg/m2  SpO2 96% Physical Exam  Nursing note and vitals reviewed. Constitutional: She is oriented to person, place, and time. She appears well-developed and well-nourished.  HENT:  Head: Normocephalic and atraumatic.  Eyes: EOM are normal. Pupils are equal, round, and reactive to light.  Neck: Normal range of motion. Neck supple.  Cardiovascular: Normal rate, regular rhythm and normal heart sounds.   No murmur heard. Pulmonary/Chest: Effort normal and breath sounds normal. No respiratory distress. She has no wheezes. She has no rales.  Abdominal: Soft. Bowel sounds are normal. She exhibits no distension. There is tenderness. There is no rebound and no guarding.  Mild diffuse tenderness, however worsening epigastric area. No hernias palpated  Musculoskeletal: Normal range of motion.  Neurological: She is alert and oriented to person, place, and time. No cranial nerve deficit.  Skin: Skin is warm and dry.  Psychiatric: She has a normal mood and affect. Her speech is normal.    ED Course  Procedures (including critical care time) Labs Review Labs Reviewed - No data to display Imaging Review No results found.  MDM   1. Abdominal pain   2. Medication reaction, initial encounter    Patient with nausea vomiting abdominal pain after bowel prep. Improved with fluids and antiemetics. Will be discharged home. Had colonoscopy in the morning    Juliet Rude. Rubin Payor, MD 03/30/13 0003

## 2013-03-29 NOTE — ED Notes (Signed)
Upper abd pain and vomiting, and diarrhea, Taking med for colonoscopy.

## 2013-03-29 NOTE — Telephone Encounter (Signed)
Pt called stating she is having a TCS tomorrow with Dr. Jena Gauss, pt states she took the laxtine back to the pharmacy and got the stool softener, pt wants to know if they were the same thing. Pt states she needs to take that before 12, because she has to start her prep. At 2. Please advise [phone]

## 2013-03-29 NOTE — Progress Notes (Signed)
Quick Note:  ifobt positive.  Proceed with colonoscopy. ______

## 2013-03-29 NOTE — ED Notes (Signed)
Family at bedside. Patient resting. No complaints at this time.

## 2013-03-30 ENCOUNTER — Encounter (HOSPITAL_COMMUNITY): Payer: Self-pay | Admitting: *Deleted

## 2013-03-30 ENCOUNTER — Ambulatory Visit (HOSPITAL_COMMUNITY)
Admission: RE | Admit: 2013-03-30 | Discharge: 2013-03-30 | Disposition: A | Discharge status: Home / Self Care | Payer: Medicare PPO | Source: Ambulatory Visit | Attending: Internal Medicine | Admitting: Internal Medicine

## 2013-03-30 ENCOUNTER — Encounter (HOSPITAL_COMMUNITY)
Admission: RE | Disposition: A | Discharge status: Home / Self Care | Payer: Self-pay | Source: Ambulatory Visit | Attending: Internal Medicine

## 2013-03-30 DIAGNOSIS — I10 Essential (primary) hypertension: Secondary | ICD-10-CM | POA: Insufficient documentation

## 2013-03-30 DIAGNOSIS — R195 Other fecal abnormalities: Secondary | ICD-10-CM

## 2013-03-30 DIAGNOSIS — D126 Benign neoplasm of colon, unspecified: Secondary | ICD-10-CM | POA: Insufficient documentation

## 2013-03-30 DIAGNOSIS — K648 Other hemorrhoids: Secondary | ICD-10-CM

## 2013-03-30 DIAGNOSIS — K5730 Diverticulosis of large intestine without perforation or abscess without bleeding: Secondary | ICD-10-CM | POA: Insufficient documentation

## 2013-03-30 DIAGNOSIS — R109 Unspecified abdominal pain: Secondary | ICD-10-CM

## 2013-03-30 DIAGNOSIS — K921 Melena: Secondary | ICD-10-CM | POA: Insufficient documentation

## 2013-03-30 HISTORY — PX: COLONOSCOPY: SHX5424

## 2013-03-30 SURGERY — COLONOSCOPY
Anesthesia: Moderate Sedation

## 2013-03-30 MED ORDER — ONDANSETRON HCL 4 MG/2ML IJ SOLN
INTRAMUSCULAR | Status: AC
  Filled 2013-03-30: qty 2

## 2013-03-30 MED ORDER — MEPERIDINE HCL 100 MG/ML IJ SOLN
INTRAMUSCULAR | Status: DC | PRN
  Administered 2013-03-30 (×2): 25 mg via INTRAVENOUS
  Administered 2013-03-30: 50 mg via INTRAVENOUS

## 2013-03-30 MED ORDER — ONDANSETRON HCL 4 MG/2ML IJ SOLN
INTRAMUSCULAR | Status: DC | PRN
  Administered 2013-03-30: 4 mg via INTRAVENOUS

## 2013-03-30 MED ORDER — MEPERIDINE HCL 100 MG/ML IJ SOLN
INTRAMUSCULAR | Status: AC
  Filled 2013-03-30: qty 2

## 2013-03-30 MED ORDER — MIDAZOLAM HCL 5 MG/5ML IJ SOLN
INTRAMUSCULAR | Status: AC
  Filled 2013-03-30: qty 10

## 2013-03-30 MED ORDER — STERILE WATER FOR IRRIGATION IR SOLN
Status: DC | PRN
  Administered 2013-03-30: 10:00:00

## 2013-03-30 MED ORDER — MIDAZOLAM HCL 5 MG/5ML IJ SOLN
INTRAMUSCULAR | Status: DC | PRN
  Administered 2013-03-30 (×2): 1 mg via INTRAVENOUS
  Administered 2013-03-30 (×2): 2 mg via INTRAVENOUS

## 2013-03-30 MED ORDER — SODIUM CHLORIDE 0.9 % IV SOLN
INTRAVENOUS | Status: DC
  Administered 2013-03-30: 09:00:00 via INTRAVENOUS

## 2013-03-30 NOTE — Op Note (Signed)
[facility] [address] [place], [address]   COLONOSCOPY PROCEDURE REPORT  PATIENT: Alice, [name]  MR#:         [id number] BIRTHDATE: 11/15/1945 , 67  yrs. old GENDER: Female ENDOSCOPIST: R.  Roetta Sessions, MD FACP [facility] REFERRED BY:  Mirna Mires, M.D. PROCEDURE DATE:  03/30/2013 PROCEDURE:     Ileocolonoscopy with snare polypectomy  INDICATIONS: Hemoccult-positive stool  INFORMED CONSENT:  The risks, benefits, alternatives and imponderables including but not limited to bleeding, perforation as well as the possibility of a missed lesion have been reviewed.  The potential for biopsy, lesion removal, etc. have also been discussed.  Questions have been answered.  All parties agreeable. Please see the history and physical in the medical record for more information.  MEDICATIONS: Versed 6 mg IV and Demerol 100 mg in divided doses. Zofran 4 mg IV.  DESCRIPTION OF PROCEDURE:  After a digital rectal exam was performed, the EC-[id number] ([id number])  colonoscope was advanced from the anus through the rectum and colon to the area of the cecum, ileocecal valve and appendiceal orifice.  The cecum was deeply intubated.  These structures were well-seen and photographed for the record.  From the level of the cecum and ileocecal valve, the scope was slowly and cautiously withdrawn.  The mucosal surfaces were carefully surveyed utilizing scope tip deflection to facilitate fold flattening as needed.  The scope was pulled down into the rectum where a thorough examination including retroflexion was performed.    FINDINGS:  Adequate preparation for somewhat friable anal canal hemorrhoids; otherwise, normal appearing rectum. Scattered P. and colonic diverticula (left greater than right). (1) 4 mm polyp at the hepatic flexure; otherwise, the remainder of colonic mucosa appeared normal. The distal 5 cm of terminal ileal mucosa appeared normal.  THERAPEUTIC / DIAGNOSTIC  MANEUVERS PERFORMED:  The above-mentioned polyp was hot snare removed and recovered for the pathologist  COMPLICATIONS: None  CECAL WITHDRAWAL TIME:  10 minutes  IMPRESSION:  Friable anal canal hemorrhoids. Single colonic polyp-removed as found above. Colonic diverticulosis.  RECOMMENDATIONS: Add Benefiber 2 teaspoons twice daily to her regimen. Resume Linzesss 145 1 capsule daily. This is been associated with marked improvement and constipation and left-sided abdominal pain. Follow up on pathology. Office visit with [place] in 2 months.   _______________________________ eSigned:  R. Roetta Sessions, MD FACP [facility] 03/30/2013 10:12 AM   CC:

## 2013-03-30 NOTE — H&P (View-Only) (Signed)
 Referring Provider: Hill, Gerald, MD Primary Care Physician:  HILL,GERALD K, MD Primary GI: Dr. Rourk   Chief Complaint  Patient presents with  . Abdominal Pain    on left side  . Constipation    HPI:   67-year-old female presents today with history of chronic abdominal pain, IBS, GERD. Last appt with our office in June 2013. Remote history of adenoma, due for surveillance in 2015.  Notes chronic LUQ discomfort, wraps from back all the way up to the front. Pain is constant, worse with constipation. At beach a few weeks ago and almost had to go to the ED. BM every 3 days. Said she was given some type of medication from Dr. Hill, which messed her up so badly. Weak from "all the going". Tries prunes, fiber. Appears medication was Amitiza.  Pain not associated with eating. Thinks it was swollen at one time. No rectal bleeding. No melena. Doesn't take Prilosec every day, only prn. Previously evaluated by Dr. Ingram in 2011 for consideration of left flank retroperitoneal hernia repair; it was felt best to avoid surgery at that time due to possibility of pain not stemming from this. Recommendation was made to evaluate back issues further. Quite anxious, tearful, being evaluated for right hand tremor. Tired of living like this but denies suicidal ideation.   Past Medical History  Diagnosis Date  . IBS (irritable bowel syndrome)     diarrhea predominant  . GERD (gastroesophageal reflux disease)     negative H pylori stool Ag  . HTN (hypertension)   . Chronic back pain   . Tubular adenoma of colon   . Diverticulosis   . Umbilical hernia   . Hyperplastic colon polyp 11/08/08    Past Surgical History  Procedure Laterality Date  . Cholecystectomy    . Nephrectomy      left  . Cervical disc surgery    . Ectopic pregnancy surgery      bilat  . Appendectomy    . Abdominal hysterectomy    . Colonoscopy  11/08/08    Dr. Rourk- hemorrhoids, pancolonic diverticula,,hyperplastic polyp  .  Colonoscopy   08/05/2005    RMR:Normal rectum/Diminutive polyp at 30 cm/Pancolonic diverticula/benign-appearing ulcer in the mid descending colon    Current Outpatient Prescriptions  Medication Sig Dispense Refill  . ALPRAZolam (XANAX) 0.5 MG tablet Take 0.5 mg by mouth daily as needed. For anxiety or sleep      . aspirin EC 81 MG tablet Take 81 mg by mouth every morning.      . diclofenac sodium (VOLTAREN) 1 % GEL Apply 1 application topically 4 (four) times daily as needed. For pain      . estradiol (CLIMARA - DOSED IN MG/24 HR) 0.05 mg/24hr Place 1 patch onto the skin once a week.       . EXFORGE 10-160 MG per tablet Take 1 tablet by mouth every morning.       . flurbiprofen (ANSAID) 100 MG tablet Take 100 mg by mouth 2 (two) times daily.      . furosemide (LASIX) 20 MG tablet Take 20 mg by mouth at bedtime.       . KLOR-CON M20 20 MEQ tablet Take 20 mEq by mouth daily. With fluid pill      . methocarbamol (ROBAXIN) 500 MG tablet Take 250 mg by mouth daily.      . omeprazole (PRILOSEC) 20 MG capsule Take 20 mg by mouth daily as needed. For acid reflux      .   Probiotic Product (RESTORA) CAPS Take 1 capsule by mouth daily as needed. For irregularity      . rosuvastatin (CRESTOR) 5 MG tablet Take 5 mg by mouth at bedtime.       . traMADol (ULTRAM) 50 MG tablet Take 50 mg by mouth every 8 (eight) hours as needed. For pain      . Linaclotide (LINZESS) 290 MCG CAPS capsule Take 1 capsule (290 mcg total) by mouth daily.  30 capsule  5   No current facility-administered medications for this visit.    Allergies as of 03/16/2013  . (No Known Allergies)    Family History  Problem Relation Age of Onset  . Cancer Brother 60    gastric  . Heart attack Mother     History   Social History  . Marital Status: Married    Spouse Name: N/A    Number of Children: N/A  . Years of Education: N/A   Occupational History  . church secretary    Social History Main Topics  . Smoking status:  Former Smoker  . Smokeless tobacco: None  . Alcohol Use: No     Comment: maybe one a month wine  . Drug Use: No  . Sexual Activity: Yes   Other Topics Concern  . None   Social History Narrative  . None    Review of Systems: Negative unless mentioned in HPI.   Physical Exam: BP 120/78  Pulse 87  Temp(Src) 98.3 F (36.8 C) (Oral)  Ht 5' 9" (1.753 m)  Wt 190 lb 9.6 oz (86.456 kg)  BMI 28.13 kg/m2 General:   Alert and oriented. No distress noted. Pleasant but worried.  Head:  Normocephalic and atraumatic. Eyes:  Conjuctiva clear without scleral icterus. Mouth:  Oral mucosa pink and moist. Good dentition. No lesions. Heart:  S1, S2 present without murmurs, rubs, or gallops. Regular rate and rhythm. Abdomen:  +BS, soft, and non-distended. No rebound or guarding. No HSM or masses noted. TTP at surgical site from prior left nephrectomy.  Msk:  Symmetrical without gross deformities. Normal posture. Extremities:  Without edema. Neurologic:  Alert and  oriented x4;  grossly normal neurologically. Skin:  Intact without significant lesions or rashes. Psych:  Alert and cooperative. Tearful.   

## 2013-03-30 NOTE — Interval H&P Note (Signed)
History and Physical Interval Note:  03/30/2013 9:33 AM  [name]  has presented today for surgery, with the diagnosis of ABDOMINAL PAIN AND BLOOD IN STOOL  The various methods of treatment have been discussed with the patient and family. After consideration of risks, benefits and other options for treatment, the patient has consented to  Procedure(s) with comments: COLONOSCOPY (N/A) - 9:45 as a surgical intervention .  The patient's history has been reviewed, patient examined, no change in status, stable for surgery.  I have reviewed the patient's chart and labs.  Questions were answered to the patient's satisfaction.     Eula Listen  Patient attempt to prep last night and got a good part of it down. Went to the ED. Nothing acute. Colonoscopy today for Hemoccult positive stool.  The risks, benefits, limitations, alternatives and imponderables have been reviewed with the patient. Questions have been answered. All parties are agreeable.

## 2013-03-31 ENCOUNTER — Encounter: Payer: Self-pay | Admitting: Internal Medicine

## 2013-04-01 ENCOUNTER — Ambulatory Visit: Payer: Medicare PPO | Admitting: Gastroenterology

## 2013-04-01 ENCOUNTER — Encounter (HOSPITAL_COMMUNITY): Payer: Self-pay | Admitting: Internal Medicine

## 2013-05-26 ENCOUNTER — Ambulatory Visit (INDEPENDENT_AMBULATORY_CARE_PROVIDER_SITE_OTHER): Payer: Medicare PPO | Admitting: Gastroenterology

## 2013-05-26 ENCOUNTER — Encounter: Payer: Self-pay | Admitting: Gastroenterology

## 2013-05-26 ENCOUNTER — Encounter (INDEPENDENT_AMBULATORY_CARE_PROVIDER_SITE_OTHER): Payer: Self-pay

## 2013-05-26 VITALS — BP 130/78 | HR 79 | Temp 97.0°F | Ht 67.0 in | Wt 192.0 lb

## 2013-05-26 DIAGNOSIS — K5900 Constipation, unspecified: Secondary | ICD-10-CM | POA: Insufficient documentation

## 2013-05-26 DIAGNOSIS — R109 Unspecified abdominal pain: Secondary | ICD-10-CM

## 2013-05-26 MED ORDER — LINACLOTIDE 145 MCG PO CAPS: 145.0000 ug | ORAL_CAPSULE | Freq: Every day | ORAL | Status: DC

## 2013-05-26 NOTE — Assessment & Plan Note (Signed)
Severe left flank pain/lateral left upper quadrant pain has resolved post colonoscopy. Continues to have some left upper quadrant pain in the anterior proximal aspect of her prior renal surgery incision. No left flank retroperitoneal hernia noted in 2011, surgery not offered at that time as it was felt this may not be the source of her pain, evaluated by Dr. Derrell Lolling.  At this time would resume Linzess daily. Consider repeat CT scan, patient is not sure whether she will pursue at this time. If she has recurrent severe pain, would highly recommend pursuing CT at that time to determine if she has any obstructions or incarceration at the level of or hernia. Patient voiced understanding. She'll call me know she will wants to pursue CT scan any point.  OV with Dr. Jena Gauss in six months,

## 2013-05-26 NOTE — Progress Notes (Signed)
Cc PCP 

## 2013-05-26 NOTE — Progress Notes (Signed)
Primary Care Physician: Evlyn Courier, MD  Primary Gastroenterologist:  Roetta Sessions, MD   Chief Complaint  Patient presents with  . Follow-up    Doing well    HPI: [name] is a 67 y.o. female here for followup of recent colonoscopy. She has a history of chronic abdominal pain, irritable bowel syndrome, GERD. Ileocolonoscopy in September of 2014 showed friable anal canal hemorrhoids, single colon polyp which was tubular adenoma, colonic diverticulosis. Trial of Linzess at that time felt to be very beneficial in management of constipation and left sided abdominal pain.   Left upper abdominal pain/flank pain gone after colonoscopy. Pain was located in the lateral aspect of her prior left kidney surgery incision. Continues to have daily left upper quadrant pain in the anterior portion of this incision typically worse with activity. Still having constipation issues. Needs new RX for Linzess. Denies any further nausea vomiting, heartburn well controlled on Prilosec when necessary, no melena rectal bleeding.  Current Outpatient Prescriptions  Medication Sig Dispense Refill  . ALPRAZolam (XANAX) 0.5 MG tablet Take 0.5 mg by mouth daily as needed. For anxiety or sleep      . amLODipine-valsartan (EXFORGE) 10-160 MG per tablet Take 1 tablet by mouth daily.      [name] aspirin EC 81 MG tablet Take 81 mg by mouth every morning.      [name] estradiol (CLIMARA - DOSED IN MG/24 HR) 0.05 mg/24hr Place 1 patch onto the skin once a week.       . furosemide (LASIX) 20 MG tablet Take 20 mg by mouth at bedtime.       . NON FORMULARY Gabapentin   Unsure mg    Once daily      . omeprazole (PRILOSEC) 20 MG capsule Take 20 mg by mouth daily as needed. For acid reflux      . potassium chloride SA (K-DUR,KLOR-CON) 20 MEQ tablet Take 20 mEq by mouth daily.      . rosuvastatin (CRESTOR) 5 MG tablet Take 5 mg by mouth at bedtime.       . traMADol (ULTRAM) 50 MG tablet Take 50 mg by mouth every 8 (eight) hours as needed.  For pain      . Linaclotide (LINZESS) 145 MCG CAPS capsule Take 1 capsule (145 mcg total) by mouth daily.  30 capsule  3   No current facility-administered medications for this visit.    Allergies as of 05/26/2013  . (No Known Allergies)    ROS:  General: Negative for anorexia, weight loss, fever, chills, fatigue, weakness. ENT: Negative for hoarseness, difficulty swallowing , nasal congestion. CV: Negative for chest pain, angina, palpitations, dyspnea on exertion, peripheral edema.  Respiratory: Negative for dyspnea at rest, dyspnea on exertion, cough, sputum, wheezing.  GI: See history of present illness. GU:  Negative for dysuria, hematuria, urinary incontinence, urinary frequency, nocturnal urination.  Endo: Negative for unusual weight change.    Physical Examination:   BP 130/78  Pulse 79  Temp(Src) 97 F (36.1 C) (Oral)  Ht 5\' 7"  (1.702 m)  Wt 192 lb (87.091 kg)  BMI 30.06 kg/m2  General: Well-nourished, well-developed in no acute distress.  Eyes: No icterus. Mouth: Oropharyngeal mucosa moist and pink , no lesions erythema or exudate. Lungs: Clear to auscultation bilaterally.  Heart: Regular rate and rhythm, no murmurs rubs or gallops.  Abdomen: Bowel sounds are normal, nontender, nondistended, no hepatosplenomegaly or masses, no abdominal bruits, no rebound or guarding.  Large incision beginning midline upper abdomen  and extending all the way to the posterior flank area. No obvious tenderness with palpation. No obvious herniation on physical exam.  Extremities: No lower extremity edema. No clubbing or deformities. Neuro: Alert and oriented x 4   Skin: Warm and dry, no jaundice.   Psych: Alert and cooperative, normal mood and affect.

## 2013-05-26 NOTE — Patient Instructions (Signed)
1. Prescription for Linzess sent to your pharmacy. 2. Please call if you decide to proceed with CT scan. 3. Office visit with Dr. Jena Gauss in six months.

## 2013-08-19 ENCOUNTER — Encounter (HOSPITAL_BASED_OUTPATIENT_CLINIC_OR_DEPARTMENT_OTHER): Payer: Self-pay | Admitting: *Deleted

## 2013-08-19 ENCOUNTER — Encounter (HOSPITAL_BASED_OUTPATIENT_CLINIC_OR_DEPARTMENT_OTHER)
Admission: RE | Admit: 2013-08-19 | Discharge: 2013-08-19 | Disposition: A | Discharge status: Home / Self Care | Payer: Medicare PPO | Source: Ambulatory Visit | Attending: Orthopedic Surgery | Admitting: Orthopedic Surgery

## 2013-08-19 ENCOUNTER — Other Ambulatory Visit: Payer: Self-pay

## 2013-08-19 ENCOUNTER — Other Ambulatory Visit: Payer: Self-pay | Admitting: Orthopedic Surgery

## 2013-08-19 DIAGNOSIS — Z01810 Encounter for preprocedural cardiovascular examination: Secondary | ICD-10-CM | POA: Insufficient documentation

## 2013-08-19 DIAGNOSIS — Z01812 Encounter for preprocedural laboratory examination: Secondary | ICD-10-CM | POA: Insufficient documentation

## 2013-08-19 LAB — BASIC METABOLIC PANEL
BUN: 11 mg/dL (ref 6–23)
CO2: 24 mEq/L (ref 19–32)
Calcium: 9.2 mg/dL (ref 8.4–10.5)
Chloride: 105 mEq/L (ref 96–112)
Creatinine, Ser: 0.87 mg/dL (ref 0.50–1.10)
GFR calc Af Amer: 78 mL/min — ABNORMAL LOW (ref >90)
GFR calc non Af Amer: 67 mL/min — ABNORMAL LOW (ref >90)
Glucose, Bld: 88 mg/dL (ref 70–99)
Potassium: 5.2 mEq/L (ref 3.7–5.3)
Sodium: 140 mEq/L (ref 137–147)

## 2013-08-19 NOTE — H&P (Signed)
[name] is an 68 y.o. female.   CC / Reason for Visit: Right hand symptoms HPI: This patient returns for reevaluation.  Since her initial visit we have received her previous records and I have reviewed them.  She did have nerve conduction studies performed on 09-16-12 which reveal moderate to severe right carpal tunnel syndrome and moderate left carpal tunnel syndrome.  In addition there may have been a subtle C8/T1 radiculopathy on the right.  She confirms that her symptoms remain the same as when I first saw her.  In addition, she reports some locking and catching of the right index finger.  Presenting history follows: This patient is a 68 year old female who presents for evaluation of her right upper extremity.  She reports that the hand falls asleep with numbness and tingling particularly when driving or speaking on the phone, and at nighttime.  In addition however she has some cramping with prolonged writing and some tremor.  She has been evaluated by neurology and she reports nerve conduction studies were performed and perhaps even a carpal tunnel injection rendered, but none of these records are available for me to review today.  Past Medical History  Diagnosis Date  . IBS (irritable bowel syndrome)     diarrhea predominant  . GERD (gastroesophageal reflux disease)     negative H pylori stool Ag  . HTN (hypertension)   . Chronic back pain   . Tubular adenoma of colon   . Diverticulosis   . Umbilical hernia   . Hyperplastic colon polyp 11/08/08  . Arthritis   . Wears glasses     Past Surgical History  Procedure Laterality Date  . Cholecystectomy    . Nephrectomy  1975    left-infection  . Cervical disc surgery    . Ectopic pregnancy surgery      bilat  . Appendectomy    . Abdominal hysterectomy    . Colonoscopy  11/08/08    Dr. Gala Romney- hemorrhoids, pancolonic diverticula,,hyperplastic polyp  . Colonoscopy   08/05/2005    NWG:NFAOZH rectum/Diminutive polyp at 14  cm/Pancolonic diverticula/benign-appearing ulcer in the mid descending colon  . Colonoscopy N/A 03/30/2013    YQM:VHQIONG anal canal hemorrhoids. Single colonic polyp-removed as found above. Colonic diverticulosis.    Family History  Problem Relation Age of Onset  . Cancer Brother 60    gastric  . Heart attack Mother    Social History:  reports that she quit smoking about 13 years ago. She does not have any smokeless tobacco history on file. She reports that she does not drink alcohol or use illicit drugs.  Allergies: No Known Allergies  No prescriptions prior to admission    No results found for this or any previous visit (from the past 48 hour(s)). No results found.  Review of Systems  All other systems reviewed and are negative.    Height 5\' 7"  (1.702 m), weight 87.091 kg (192 lb). Physical Exam  Constitutional:  WD, WN, NAD HEENT:  NCAT, EOMI Neuro/Psych:  Alert & oriented to person, place, and time; appropriate mood & affect Lymphatic: No generalized UE edema or lymphadenopathy Extremities / MSK:  Both UE are normal with respect to appearance, ranges of motion, joint stability, muscle strength/tone, sensation, & perfusion except as otherwise noted:  Right hand grossly normal in appearance with good musculature.  5/5 strength.  Negative carpal compression testing, positive Phalen's, negative Tinel's.  Occasionally a tremor associated with motion is evident.  TTP over index finger A1 pulley  with inducible catching  Labs / Xrays:  No radiographic studies obtained today.  Assessment:  Bilateral carpal tunnel syndrome, but likely also with some other neurological problem causing tremor  Plan: We briefly discussed the details of the nerve studies, as well as peripheral nerve compression.  I let her to believe that nerve decompression surgery would likely not fix or tremor, and may not address any of her proximal symptoms but would be the recommended treatment as it relates to  numbness and tingling in the median distribution.  It may, however, influence or affects him a more proximal pain that she describes.  In addition to a right endoscopic carpal tunnel release, I recommend she undergo a trigger release of the right index finger.  This is tentatively planned for next week.  The details of the operative procedure were discussed with the patient.  Questions were invited and answered.  In addition to the goal of the procedure, the risks of the procedure to include but not limited to bleeding; infection; damage to the nerves or blood vessels that could result in bleeding, numbness, weakness, chronic pain, and the need for additional procedures; stiffness; the need for revision surgery; and anesthetic risks, the worst of which is death, were reviewed.  No specific outcome was guaranteed or implied.  Informed consent was obtained.    Doctor Sheahan A. 08/19/2013, 3:05 PM

## 2013-08-19 NOTE — Progress Notes (Signed)
Pt here-bmet and ekg done

## 2013-08-23 ENCOUNTER — Encounter (HOSPITAL_BASED_OUTPATIENT_CLINIC_OR_DEPARTMENT_OTHER): Payer: Self-pay | Admitting: *Deleted

## 2013-08-23 ENCOUNTER — Ambulatory Visit (HOSPITAL_BASED_OUTPATIENT_CLINIC_OR_DEPARTMENT_OTHER): Payer: Medicare PPO | Admitting: Certified Registered"

## 2013-08-23 ENCOUNTER — Encounter (HOSPITAL_BASED_OUTPATIENT_CLINIC_OR_DEPARTMENT_OTHER)
Admission: RE | Disposition: A | Discharge status: Home / Self Care | Payer: Self-pay | Source: Ambulatory Visit | Attending: Orthopedic Surgery

## 2013-08-23 ENCOUNTER — Ambulatory Visit (HOSPITAL_BASED_OUTPATIENT_CLINIC_OR_DEPARTMENT_OTHER)
Admission: RE | Admit: 2013-08-23 | Discharge: 2013-08-23 | Disposition: A | Discharge status: Home / Self Care | Payer: Medicare PPO | Source: Ambulatory Visit | Attending: Orthopedic Surgery | Admitting: Orthopedic Surgery

## 2013-08-23 ENCOUNTER — Encounter (HOSPITAL_BASED_OUTPATIENT_CLINIC_OR_DEPARTMENT_OTHER): Payer: Medicare PPO | Admitting: Certified Registered"

## 2013-08-23 DIAGNOSIS — M6530 Trigger finger, unspecified finger: Secondary | ICD-10-CM | POA: Insufficient documentation

## 2013-08-23 DIAGNOSIS — Z87891 Personal history of nicotine dependence: Secondary | ICD-10-CM | POA: Insufficient documentation

## 2013-08-23 DIAGNOSIS — K219 Gastro-esophageal reflux disease without esophagitis: Secondary | ICD-10-CM | POA: Insufficient documentation

## 2013-08-23 DIAGNOSIS — I10 Essential (primary) hypertension: Secondary | ICD-10-CM | POA: Insufficient documentation

## 2013-08-23 DIAGNOSIS — G5600 Carpal tunnel syndrome, unspecified upper limb: Secondary | ICD-10-CM | POA: Insufficient documentation

## 2013-08-23 HISTORY — PX: CARPAL TUNNEL RELEASE: SHX101

## 2013-08-23 HISTORY — PX: TRIGGER FINGER RELEASE: SHX641

## 2013-08-23 HISTORY — DX: Unspecified osteoarthritis, unspecified site: M19.90

## 2013-08-23 HISTORY — DX: Presence of spectacles and contact lenses: Z97.3

## 2013-08-23 LAB — POCT HEMOGLOBIN-HEMACUE: Hemoglobin: 12.4 g/dL (ref 12.0–15.0)

## 2013-08-23 SURGERY — RELEASE, A1 PULLEY, FOR TRIGGER FINGER
Anesthesia: Monitor Anesthesia Care | Site: Finger | Laterality: Right

## 2013-08-23 MED ORDER — LIDOCAINE HCL (CARDIAC) 20 MG/ML IV SOLN
INTRAVENOUS | Status: DC | PRN
  Administered 2013-08-23: 30 mg via INTRAVENOUS

## 2013-08-23 MED ORDER — OXYCODONE-ACETAMINOPHEN 5-325 MG PO TABS: 1.0000 | ORAL_TABLET | ORAL | Status: DC | PRN

## 2013-08-23 MED ORDER — HYDROMORPHONE HCL PF 1 MG/ML IJ SOLN: 0.2500 mg | INTRAMUSCULAR | Status: DC | PRN

## 2013-08-23 MED ORDER — CITRIC ACID-SODIUM CITRATE 334-500 MG/5ML PO SOLN
30.0000 mL | Freq: Once | ORAL | Status: DC
  Filled 2013-08-23: qty 30

## 2013-08-23 MED ORDER — LACTATED RINGERS IV SOLN: INTRAVENOUS | Status: DC

## 2013-08-23 MED ORDER — BUPIVACAINE-EPINEPHRINE PF 0.5-1:200000 % IJ SOLN
INTRAMUSCULAR | Status: DC | PRN
  Administered 2013-08-23: 5 mL

## 2013-08-23 MED ORDER — LACTATED RINGERS IV SOLN
INTRAVENOUS | Status: DC
  Administered 2013-08-23: 09:00:00 via INTRAVENOUS

## 2013-08-23 MED ORDER — CITRIC ACID-SODIUM CITRATE 334-500 MG/5ML PO SOLN
30.0000 mL | Freq: Once | ORAL | Status: AC
  Administered 2013-08-23: 30 mL via ORAL

## 2013-08-23 MED ORDER — FENTANYL CITRATE 0.05 MG/ML IJ SOLN: 50.0000 ug | INTRAMUSCULAR | Status: DC | PRN

## 2013-08-23 MED ORDER — FENTANYL CITRATE 0.05 MG/ML IJ SOLN
INTRAMUSCULAR | Status: AC
  Filled 2013-08-23: qty 4

## 2013-08-23 MED ORDER — PROPOFOL INFUSION 10 MG/ML OPTIME
INTRAVENOUS | Status: DC | PRN
  Administered 2013-08-23: 75 ug/kg/min via INTRAVENOUS

## 2013-08-23 MED ORDER — BUPIVACAINE-EPINEPHRINE PF 0.5-1:200000 % IJ SOLN
INTRAMUSCULAR | Status: AC
  Filled 2013-08-23: qty 30

## 2013-08-23 MED ORDER — CEFAZOLIN SODIUM-DEXTROSE 2-3 GM-% IV SOLR
INTRAVENOUS | Status: AC
  Filled 2013-08-23: qty 50

## 2013-08-23 MED ORDER — OXYCODONE HCL 5 MG PO TABS: 5.0000 mg | ORAL_TABLET | Freq: Once | ORAL | Status: DC | PRN

## 2013-08-23 MED ORDER — CEFAZOLIN SODIUM-DEXTROSE 2-3 GM-% IV SOLR
INTRAVENOUS | Status: DC | PRN
  Administered 2013-08-23: 2 g via INTRAVENOUS

## 2013-08-23 MED ORDER — OXYCODONE HCL 5 MG/5ML PO SOLN: 5.0000 mg | Freq: Once | ORAL | Status: DC | PRN

## 2013-08-23 MED ORDER — HYDROMORPHONE HCL PF 1 MG/ML IJ SOLN: 0.5000 mg | INTRAMUSCULAR | Status: DC | PRN

## 2013-08-23 MED ORDER — FENTANYL CITRATE 0.05 MG/ML IJ SOLN
INTRAMUSCULAR | Status: DC | PRN
  Administered 2013-08-23: 50 ug via INTRAVENOUS

## 2013-08-23 MED ORDER — MIDAZOLAM HCL 2 MG/2ML IJ SOLN: 1.0000 mg | INTRAMUSCULAR | Status: DC | PRN

## 2013-08-23 MED ORDER — LIDOCAINE HCL 2 % IJ SOLN
INTRAMUSCULAR | Status: AC
  Filled 2013-08-23: qty 20

## 2013-08-23 MED ORDER — HYDROCODONE-ACETAMINOPHEN 5-325 MG PO TABS: 1.0000 | ORAL_TABLET | ORAL | Status: DC | PRN

## 2013-08-23 MED ORDER — LIDOCAINE HCL 2 % IJ SOLN
INTRAMUSCULAR | Status: DC | PRN
  Administered 2013-08-23: 5 mL

## 2013-08-23 MED ORDER — ONDANSETRON HCL 4 MG/2ML IJ SOLN
INTRAMUSCULAR | Status: DC | PRN
  Administered 2013-08-23: 4 mg via INTRAVENOUS

## 2013-08-23 SURGICAL SUPPLY — 48 items
APPLICATOR COTTON TIP 6IN STRL (MISCELLANEOUS) ×3 IMPLANT
BANDAGE COBAN STERILE 2 (GAUZE/BANDAGES/DRESSINGS) IMPLANT
BLADE HOOK ENDO STRL (BLADE) ×3 IMPLANT
BLADE MINI RND TIP GREEN BEAV (BLADE) IMPLANT
BLADE SURG 15 STRL LF DISP TIS (BLADE) ×1 IMPLANT
BLADE SURG 15 STRL SS (BLADE) ×3
BLADE TRIANGLE EPF/EGR ENDO (BLADE) ×3 IMPLANT
BNDG CMPR 9X4 STRL LF SNTH (GAUZE/BANDAGES/DRESSINGS) ×1
BNDG COHESIVE 1X5 TAN STRL LF (GAUZE/BANDAGES/DRESSINGS) ×3 IMPLANT
BNDG COHESIVE 4X5 TAN STRL (GAUZE/BANDAGES/DRESSINGS) ×3 IMPLANT
BNDG CONFORM 2 STRL LF (GAUZE/BANDAGES/DRESSINGS) ×3 IMPLANT
BNDG ESMARK 4X9 LF (GAUZE/BANDAGES/DRESSINGS) ×3 IMPLANT
BNDG GAUZE ELAST 4 BULKY (GAUZE/BANDAGES/DRESSINGS) ×6 IMPLANT
CHLORAPREP W/TINT 26ML (MISCELLANEOUS) ×3 IMPLANT
CORDS BIPOLAR (ELECTRODE) IMPLANT
COVER MAYO STAND STRL (DRAPES) ×3 IMPLANT
COVER TABLE BACK 60X90 (DRAPES) ×3 IMPLANT
CUFF TOURNIQUET SINGLE 18IN (TOURNIQUET CUFF) IMPLANT
DRAIN TLS ROUND 10FR (DRAIN) ×3 IMPLANT
DRAPE EXTREMITY T 121X128X90 (DRAPE) ×3 IMPLANT
DRAPE SURG 17X23 STRL (DRAPES) ×3 IMPLANT
DRSG EMULSION OIL 3X3 NADH (GAUZE/BANDAGES/DRESSINGS) ×3 IMPLANT
GLOVE BIO SURGEON STRL SZ7.5 (GLOVE) ×3 IMPLANT
GLOVE BIOGEL PI IND STRL 7.0 (GLOVE) IMPLANT
GLOVE BIOGEL PI IND STRL 8 (GLOVE) ×1 IMPLANT
GLOVE BIOGEL PI INDICATOR 7.0 (GLOVE) ×2
GLOVE BIOGEL PI INDICATOR 8 (GLOVE) ×2
GLOVE ECLIPSE 6.5 STRL STRAW (GLOVE) ×2 IMPLANT
GLOVE EXAM NITRILE MD LF STRL (GLOVE) ×2 IMPLANT
GOWN STRL REUS W/ TWL LRG LVL3 (GOWN DISPOSABLE) ×2 IMPLANT
GOWN STRL REUS W/TWL LRG LVL3 (GOWN DISPOSABLE) ×6
NDL HYPO 25X1 1.5 SAFETY (NEEDLE) IMPLANT
NDL SAFETY ECLIPSE 18X1.5 (NEEDLE) IMPLANT
NEEDLE HYPO 18GX1.5 SHARP (NEEDLE)
NEEDLE HYPO 25X1 1.5 SAFETY (NEEDLE) IMPLANT
NS IRRIG 1000ML POUR BTL (IV SOLUTION) ×3 IMPLANT
PACK BASIN DAY SURGERY FS (CUSTOM PROCEDURE TRAY) ×3 IMPLANT
PADDING CAST ABS 4INX4YD NS (CAST SUPPLIES) ×2
PADDING CAST ABS COTTON 4X4 ST (CAST SUPPLIES) IMPLANT
SPONGE GAUZE 4X4 12PLY (GAUZE/BANDAGES/DRESSINGS) ×5 IMPLANT
SPONGE GAUZE 4X4 12PLY STER LF (GAUZE/BANDAGES/DRESSINGS) ×1 IMPLANT
STOCKINETTE 4X48 STRL (DRAPES) ×3 IMPLANT
SUT VICRYL RAPIDE 4/0 PS 2 (SUTURE) ×3 IMPLANT
SYR BULB 3OZ (MISCELLANEOUS) ×3 IMPLANT
SYRINGE 10CC LL (SYRINGE) IMPLANT
TOWEL OR 17X24 6PK STRL BLUE (TOWEL DISPOSABLE) ×6 IMPLANT
TOWEL OR NON WOVEN STRL DISP B (DISPOSABLE) ×3 IMPLANT
UNDERPAD 30X30 INCONTINENT (UNDERPADS AND DIAPERS) ×3 IMPLANT

## 2013-08-23 NOTE — Op Note (Signed)
08/23/2013  10:12 AM  PATIENT:  [name]  68 y.o. female  PRE-OPERATIVE DIAGNOSIS:  RIGHT CARPAL TUNNEL SYNDROME, RIGHT INDEX TRIGGER DIGIT  POST-OPERATIVE DIAGNOSIS:  Same  PROCEDURE:  Procedure(s): RIGHT INDEX FINGER TRIGGER RELEASE WITH TENOSYNOVIAL DEBRIDEMENT RIGHT ENDOSCOPIC CARPAL  TUNNEL RELEASE  SURGEON:  Surgeon(s): Jolyn Nap, MD  PHYSICIAN ASSISTANT: None  ANESTHESIA:  Local / MAC  SPECIMENS:  None  DRAINS:   None  PREOPERATIVE INDICATIONS:  [name] is a  68 y.o. female with a diagnosis of RIGHT CARPAL TUNNEL SYNDROME, RIGHT INDEX TRIGGER DIGIT who failed conservative measures and elected for surgical management.    The risks benefits and alternatives were discussed with the patient preoperatively including but not limited to the risks of infection, bleeding, nerve injury, cardiopulmonary complications, the need for revision surgery, among others, and the patient verbalized understanding and consented to proceed.  OPERATIVE IMPLANTS: None  OPERATIVE FINDINGS: Tight carpal tunnel, acceptably decompressed following release  OPERATIVE PROCEDURE:  After receiving prophylactic antibiotics, the patient was escorted to the operative theatre and placed in a supine position.  A surgical "time-out" was performed during which the planned procedure, proposed operative site, and the correct patient identity were compared to the operative consent and agreement confirmed by the circulating nurse according to current facility policy.  Following application of a tourniquet to the operative extremity, the planned incisions were marked and anesthetized with a mixture of lidocaine & bupivicaine containing epinephrine.  The exposed skin was prepped with Chloraprep and draped in the usual sterile fashion.  The limb was exsanguinated with an Esmarch bandage and the tourniquet inflated to approximately 115mmHg higher than systolic BP.   The incisions were made sharply.  Subcutaneous tissues were dissected with blunt and spreading dissection. The trigger digit was addressed first. The skin was incised sharply with a scalpel and the flaps retracted. This revealed the underlying flexor tendon sheath and the A1 pulley. Care was taken to look for and preserve the radial digital nerve to the index finger. The A1 pulley was incised in its midline. In a crossing bands of the flexor tendons were also incised more proximally. The tendon was pulled into view and inspected. The deep tendon had some fraying and minor longitudinal split tearing which was debrided.  Both had some thickened tenosynovium that was also debrided.  Next, attention was directed to performing endoscopic carpal tunnel release. At the proximal incision, the deep forearm fascia was split in line with the skin incision the distal edge grasped with a hemostat. At the mid palmar incision, the palmar fascia was split in line with the skin incision, revealing the underlying superficial palmar arch which was visualized with loupe assisted magnification. The synovial reflector was then introduced into the proximal incision and passed through the carpal canal, uses to reflect synovium from the deep surface of the transverse carpal ligament. It was removed and replaced with the slotted cannula and blunt obturator, passing from proximal to distal and exiting the distal wound superficial to the superficial palmar arch. The obturator was removed and the camera inserted. Visualization of the ligament was acceptable. The triangle shape blade was then inserted distally, advanced to the midportion of the ligament, and there used to create a perforation in the ligament. This instrument was removed and the hooked nstrument was inserted. It was placed into the perforation in the ligament and withdrawn distally, completing transection of the distal half of the ligament. The camera was then removed and placed  into the distal end of the  cannula. The hooked instrument was placed into the proximal end and advanced facility to be placed into the apex of the V. which had been formed to the distal hemi-transection of the ligament. It was withdrawn proximally, completing transection of the ligament. The adequacy of the release was judged with the scope and the instruments used as a probe. All of the endoscopic instruments are removed and the adequacy of release was again judged from the proximal incision perspective with direct loupe assisted visualization. In addition, the proximal forearm fascia was split for 2 inches proximal to the proximal incision under direct visualization using a sliding scissor technique. The tourniquet was released, the wound copiously irrigated. A TLS drain was placed to lie alongside the nerve exiting its own skin hole distally made with some sharp trocar. The skin was then closed with 4-0 Vicryl Rapide interrupted sutures. A light dressing was applied and the balance of 10 mL to the initial anesthetic mixture was placed on the drain, and the drain was clamped to be unclamped in the recovery room.  DISPOSITION:  The patient will be discharged home today, returning in 10-15 days for re-assessment.

## 2013-08-23 NOTE — Anesthesia Preprocedure Evaluation (Signed)
Anesthesia Evaluation  Patient identified by MRN, date of birth, ID band Patient awake    Reviewed: Allergy & Precautions, H&P , NPO status , Patient's Chart, lab work & pertinent test results  Airway Mallampati: II TM Distance: >3 FB Neck ROM: Full    Dental no notable dental hx. (+) Teeth Intact, Dental Advisory Given   Pulmonary neg pulmonary ROS, former smoker,  breath sounds clear to auscultation  Pulmonary exam normal       Cardiovascular hypertension, negative cardio ROS  Rhythm:Regular Rate:Normal     Neuro/Psych negative neurological ROS  negative psych ROS   GI/Hepatic Neg liver ROS, GERD-  Medicated and Controlled,  Endo/Other  negative endocrine ROS  Renal/GU negative Renal ROS  negative genitourinary   Musculoskeletal   Abdominal   Peds  Hematology negative hematology ROS (+)   Anesthesia Other Findings   Reproductive/Obstetrics negative OB ROS                           Anesthesia Physical Anesthesia Plan  ASA: II  Anesthesia Plan: MAC   Post-op Pain Management:    Induction: Intravenous  Airway Management Planned: Simple Face Mask  Additional Equipment:   Intra-op Plan:   Post-operative Plan:   Informed Consent: I have reviewed the patients History and Physical, chart, labs and discussed the procedure including the risks, benefits and alternatives for the proposed anesthesia with the patient or authorized representative who has indicated his/her understanding and acceptance.   Dental advisory given  Plan Discussed with: CRNA  Anesthesia Plan Comments:         Anesthesia Quick Evaluation

## 2013-08-23 NOTE — Interval H&P Note (Signed)
History and Physical Interval Note:  08/23/2013 9:36 AM  [name]  has presented today for surgery, with the diagnosis of RIGHT CARPAL TUNNEL SYNDROME, RIGHT INDEX TRIGGER DIGIT  The various methods of treatment have been discussed with the patient and family. After consideration of risks, benefits and other options for treatment, the patient has consented to  Procedure(s): RIGHT INDEX FINGER TRIGGER RELEASE  (Right) RIGHT ENDOSCOPIC CARPAL  TUNNEL RELEASE (Right) as a surgical intervention .  The patient's history has been reviewed, patient examined, no change in status, stable for surgery.  I have reviewed the patient's chart and labs.  Questions were answered to the patient's satisfaction.     [name] A.

## 2013-08-23 NOTE — Transfer of Care (Signed)
Immediate Anesthesia Transfer of Care Note  Patient: [name]  Procedure(s) Performed: Procedure(s): RIGHT INDEX FINGER TRIGGER RELEASE  (Right) RIGHT ENDOSCOPIC CARPAL  TUNNEL RELEASE (Right)  Patient Location: PACU  Anesthesia Type:MAC  Level of Consciousness: awake, alert , oriented and patient cooperative  Airway & Oxygen Therapy: Patient Spontanous Breathing and Patient connected to face mask oxygen  Post-op Assessment: Report given to PACU RN and Post -op Vital signs reviewed and stable  Post vital signs: Reviewed and stable  Complications: No apparent anesthesia complications

## 2013-08-23 NOTE — Discharge Instructions (Signed)
Discharge Instructions ° ° °You have a light dressing on your hand.  °You may begin gentle motion of your fingers and hand immediately, but you should not do any heavy lifting or gripping.  Elevate your hand to reduce pain & swelling of the digits.  Ice over the operative site may be helpful to reduce pain & swelling.  DO NOT USE HEAT. °Pain medicine has been prescribed for you.  °Use your medicine as needed over the first 48 hours, and then you can begin to taper your use. You may use Tylenol in place of your prescribed pain medication, but not IN ADDITION to it. °Leave the dressing in place until the third day after your surgery and then remove it, leaving it open to air.  °After the bandage has been removed you may shower, but do not soak the incision.  °You may drive a car when you are off of prescription pain medications and can safely control your vehicle with both hands. °We will address whether therapy will be required or not when you return to the office. °You may have already made your follow-up appointment when we completed your preop visit.  If not, please call our office today or the next business day to make your return appointment for 10-15 days after surgery. ° ° °Please call [phone] during normal business hours or [phone] after hours for any problems. Including the following: ° °- excessive redness of the incisions °- drainage for more than 4 days °- fever of more than 101.5 F ° °*Please note that pain medications will not be refilled after hours or on weekends. ° ° °[facility] Drain Instructions °You have a drain tube in place to help limit the amount of blood that collects under your skin in the early post-operative period.  The amount it drains will vary from person-to-person and is dependent upon many factors.  You will have 1-2 extra drain tubes sent with you from the facility.  You should change the tube according to these instructions below when the tube is about half full. ° °BE SURE TO  SLIDE THE CLAMP TO THE CLOSED POSITION BEFORE CHANGING THE TUBE.   ° °RE-OPEN THE CLAMP ONCE THE GLASS EVACUATION TUBE HAS BEEN CHANGED. ° °24 hours after surgery the amount of drainage will likely be negligible and it will be time to remove the tube and discard it.  Simply remove the tape that secures the flexible plastic drainage tube to the bandage and briskly pull the tube straight out.  You will likely feel a little discomfort during this process, but the tube should slide out as it is not sewn or otherwise secured directly to your body.  It is merely held in place by the bandage itself.  °                           ° °If you are not clear about when your first post-op appointment is scheduled, please call the office on the next business day to inquire about it.  Please also call the office if you have any other questions ([phone]).   ° °Post Anesthesia Home Care Instructions ° °Activity: °Get plenty of rest for the remainder of the day. A responsible adult should stay with you for 24 hours following the procedure.  °For the next 24 hours, DO NOT: °-Drive a car °-Operate machinery °-Drink alcoholic beverages °-Take any medication unless instructed by your physician °-Make any legal decisions or   sign important papers. ° °Meals: °Start with liquid foods such as gelatin or soup. Progress to regular foods as tolerated. Avoid greasy, spicy, heavy foods. If nausea and/or vomiting occur, drink only clear liquids until the nausea and/or vomiting subsides. Call your physician if vomiting continues. ° °Special Instructions/Symptoms: °Your throat may feel dry or sore from the anesthesia or the breathing tube placed in your throat during surgery. If this causes discomfort, gargle with warm salt water. The discomfort should disappear within 24 hours. ° °

## 2013-08-23 NOTE — Anesthesia Procedure Notes (Signed)
Procedure Name: MAC Date/Time: 08/23/2013 10:30 AM Performed by: Lyndee Leo Pre-anesthesia Checklist: Patient identified, Emergency Drugs available, Suction available, Patient being monitored and Timeout performed Patient Re-evaluated:Patient Re-evaluated prior to inductionOxygen Delivery Method: Simple face mask

## 2013-08-23 NOTE — Anesthesia Postprocedure Evaluation (Signed)
  Anesthesia Post-op Note  Patient: [name]  Procedure(s) Performed: Procedure(s): RIGHT INDEX FINGER TRIGGER RELEASE  (Right) RIGHT ENDOSCOPIC CARPAL  TUNNEL RELEASE (Right)  Patient Location: PACU  Anesthesia Type: MAC  Level of Consciousness: awake and alert   Airway and Oxygen Therapy: Patient Spontanous Breathing  Post-op Pain: none  Post-op Assessment: Post-op Vital signs reviewed, Patient's Cardiovascular Status Stable and Respiratory Function Stable  Post-op Vital Signs: Reviewed  Filed Vitals:   08/23/13 1230  BP: 125/76  Pulse: 59  Temp:   Resp: 12    Complications: No apparent anesthesia complications

## 2013-08-24 ENCOUNTER — Encounter (HOSPITAL_BASED_OUTPATIENT_CLINIC_OR_DEPARTMENT_OTHER): Payer: Self-pay | Admitting: Orthopedic Surgery

## 2013-10-12 ENCOUNTER — Encounter: Payer: Self-pay | Admitting: Internal Medicine

## 2013-11-11 ENCOUNTER — Encounter (HOSPITAL_COMMUNITY): Payer: Self-pay | Admitting: Emergency Medicine

## 2013-11-11 DIAGNOSIS — F19939 Other psychoactive substance use, unspecified with withdrawal, unspecified: Secondary | ICD-10-CM | POA: Insufficient documentation

## 2013-11-11 DIAGNOSIS — Z860100 Personal history of colon polyps, unspecified: Secondary | ICD-10-CM | POA: Insufficient documentation

## 2013-11-11 DIAGNOSIS — R450 Nervousness: Secondary | ICD-10-CM | POA: Insufficient documentation

## 2013-11-11 DIAGNOSIS — Z87891 Personal history of nicotine dependence: Secondary | ICD-10-CM | POA: Insufficient documentation

## 2013-11-11 DIAGNOSIS — Z7982 Long term (current) use of aspirin: Secondary | ICD-10-CM | POA: Insufficient documentation

## 2013-11-11 DIAGNOSIS — R197 Diarrhea, unspecified: Secondary | ICD-10-CM | POA: Insufficient documentation

## 2013-11-11 DIAGNOSIS — M129 Arthropathy, unspecified: Secondary | ICD-10-CM | POA: Insufficient documentation

## 2013-11-11 DIAGNOSIS — F19239 Other psychoactive substance dependence with withdrawal, unspecified: Secondary | ICD-10-CM | POA: Insufficient documentation

## 2013-11-11 DIAGNOSIS — G8929 Other chronic pain: Secondary | ICD-10-CM | POA: Insufficient documentation

## 2013-11-11 DIAGNOSIS — K219 Gastro-esophageal reflux disease without esophagitis: Secondary | ICD-10-CM | POA: Insufficient documentation

## 2013-11-11 DIAGNOSIS — R51 Headache: Secondary | ICD-10-CM | POA: Insufficient documentation

## 2013-11-11 DIAGNOSIS — K589 Irritable bowel syndrome without diarrhea: Secondary | ICD-10-CM | POA: Insufficient documentation

## 2013-11-11 DIAGNOSIS — I10 Essential (primary) hypertension: Secondary | ICD-10-CM | POA: Insufficient documentation

## 2013-11-11 DIAGNOSIS — Z79899 Other long term (current) drug therapy: Secondary | ICD-10-CM | POA: Insufficient documentation

## 2013-11-11 NOTE — ED Notes (Signed)
Patient c/o hypertension and headache.  Patient states she has seen her MD for same for the past 2 days.  Patient states was put on Prednisone and Toradol; states not helping.

## 2013-11-12 ENCOUNTER — Encounter (HOSPITAL_COMMUNITY): Payer: Self-pay | Admitting: Emergency Medicine

## 2013-11-12 ENCOUNTER — Ambulatory Visit (HOSPITAL_COMMUNITY)
Admission: RE | Admit: 2013-11-12 | Discharge: 2013-11-12 | Disposition: A | Discharge status: Home / Self Care | Payer: Medicare PPO | Source: Ambulatory Visit | Attending: Family Medicine | Admitting: Family Medicine

## 2013-11-12 ENCOUNTER — Emergency Department (HOSPITAL_COMMUNITY)
Admission: EM | Admit: 2013-11-12 | Discharge: 2013-11-12 | Disposition: A | Discharge status: Home / Self Care | Payer: Medicare PPO | Attending: Emergency Medicine | Admitting: Emergency Medicine

## 2013-11-12 ENCOUNTER — Other Ambulatory Visit (HOSPITAL_COMMUNITY): Payer: Self-pay | Admitting: Family Medicine

## 2013-11-12 DIAGNOSIS — F1123 Opioid dependence with withdrawal: Secondary | ICD-10-CM

## 2013-11-12 DIAGNOSIS — I10 Essential (primary) hypertension: Secondary | ICD-10-CM

## 2013-11-12 DIAGNOSIS — M5137 Other intervertebral disc degeneration, lumbosacral region: Secondary | ICD-10-CM | POA: Insufficient documentation

## 2013-11-12 DIAGNOSIS — M545 Low back pain, unspecified: Secondary | ICD-10-CM | POA: Insufficient documentation

## 2013-11-12 DIAGNOSIS — M47817 Spondylosis without myelopathy or radiculopathy, lumbosacral region: Secondary | ICD-10-CM | POA: Insufficient documentation

## 2013-11-12 DIAGNOSIS — W19XXXA Unspecified fall, initial encounter: Secondary | ICD-10-CM | POA: Insufficient documentation

## 2013-11-12 DIAGNOSIS — F1193 Opioid use, unspecified with withdrawal: Secondary | ICD-10-CM

## 2013-11-12 DIAGNOSIS — M79609 Pain in unspecified limb: Secondary | ICD-10-CM | POA: Insufficient documentation

## 2013-11-12 DIAGNOSIS — M51379 Other intervertebral disc degeneration, lumbosacral region without mention of lumbar back pain or lower extremity pain: Secondary | ICD-10-CM | POA: Insufficient documentation

## 2013-11-12 DIAGNOSIS — M25559 Pain in unspecified hip: Secondary | ICD-10-CM | POA: Insufficient documentation

## 2013-11-12 MED ORDER — CLONIDINE HCL 0.2 MG PO TABS
0.2000 mg | ORAL_TABLET | Freq: Once | ORAL | Status: AC
  Administered 2013-11-12: 0.2 mg via ORAL
  Filled 2013-11-12: qty 1

## 2013-11-12 MED ORDER — LORAZEPAM 1 MG PO TABS
1.0000 mg | ORAL_TABLET | Freq: Once | ORAL | Status: AC
  Administered 2013-11-12: 1 mg via ORAL
  Filled 2013-11-12: qty 1

## 2013-11-12 MED ORDER — LORAZEPAM 1 MG PO TABS: 1.0000 mg | ORAL_TABLET | Freq: Three times a day (TID) | ORAL | Status: DC | PRN

## 2013-11-12 MED ORDER — CLONIDINE HCL 0.1 MG PO TABS: 0.1000 mg | ORAL_TABLET | Freq: Three times a day (TID) | ORAL | Status: DC

## 2013-11-12 MED ORDER — LOPERAMIDE HCL 2 MG PO CAPS
4.0000 mg | ORAL_CAPSULE | Freq: Once | ORAL | Status: AC
  Administered 2013-11-12: 4 mg via ORAL
  Filled 2013-11-12: qty 2

## 2013-11-12 NOTE — ED Notes (Signed)
Went to her PCP on 5/6 and 5/7 2015. States that her PCP took her off of Tramadol and started new medicine in shot and pill form. Feels anxious when she tries to lay down. Feeling restless. States that she has not slept well in the last 48 hours.

## 2013-11-12 NOTE — Discharge Instructions (Signed)
Take loperamide (Imodium AD) as needed for diarrhea.  Narcotic Withdrawal When drug use interferes with normal living activities and relationships, it is abuse. Abuse includes problems with family and friends. Psychological dependence has developed when your mind tells you that the drug is needed. This is usually followed by a physical dependence in which you need more of the drug to get the same feeling or "high." This is known as addiction or chemical dependency. Risk is greater when chemical dependency exists in the family. SYMPTOMS  When tolerance to narcotics has developed, stopping of the narcotic suddenly can cause uncomfortable physical symptoms. Most of the time these are mild and consist of shakes or jitters (tremors) in the hands,a rapid heart rate, rapid breathing, and temperature. Sometimes these symptoms are associated with anxiety, panic attacks, and bad dreams. Other symptoms include:  Irritability.  Anxiety.  Runny nose.  "Goose flesh."  Diarrhea.  Feeling sick to the stomach (nauseous).  Muscle spasms.  Sleeplessness.  Chills.  Sweats.  Drug cravings.  Confusion. The severity of the withdrawal is based on the individual and varies from person to person. Many people choose to continue using narcotics to get rid of the discomfort of withdrawal. They also use to try to feel normal. TREATMENT  Quitting an addiction means stopping use of all chemicals. This is hard but may save your life. With continual drug use, possible outcomes are often loss of self respect and esteem, violence, death, and eventually prison if the use of narcotics has led to the death of another. Addiction cannot be cured, but it can be stopped. This often requires outside help and the care of professionals. Most hospitals and clinics can refer you to a specialized care center. It is not necessary for you to go through the uncomfortable symptoms of withdrawal. Your caregiver can provide you with  medications that will help you through this difficult period. Try to avoid situations, friends, or alcohol, which may have made it possible for you to continue using narcotics in the past. Learn how to say no! HOME CARE INSTRUCTIONS   Drink fluids, get plenty of rest, and take hot baths.  Medicines may be prescribed to help control withdrawal symptoms.  Over-the-counter medicines may be helpful to control diarrhea or an upset stomach.  If your problems resulted from taking prescription pain medicines, make sure you have a follow-up visit with your caregiver within the next few days. Be open about this problem.  If you are dependent or addicted to street drugs, contact a local drug and alcohol treatment center or Narcotics Anonymous.  Have someone with you to monitor your symptoms.  Engage in healthy activities with friends who do not use drugs.  Stay away from the drug scene. It takes a long period of time to overcome addictions to all drugs. There may be times when you feel as though you want to use. Following loss of a physical addiction and going through withdrawal, you have conquered the most difficult part of getting rid of an addiction. Gradually, you will have a lessening of the craving that is telling you that you need narcotics to feel normal. Call your caregiver or a member of your support group if more support is needed. Learn who to talk to in your family and among your friends so that during these periods you can receive outside help. SEEK IMMEDIATE MEDICAL CARE IF:   You have vomiting that cannot be controlled, especially if you cannot keep liquids down.  You are  seeing things or hearing voices that are not really there (hallucinating).  You have a seizure. Document Released: 09/14/2002 Document Revised: 09/16/2011 Document Reviewed: 06/19/2008 [facility] Patient Information 2014 [place].  Clonidine tablets What is this medicine? CLONIDINE (KLOE ni deen) is used to  treat high blood pressure. This medicine may be used for other purposes; ask your health care provider or pharmacist if you have questions. COMMON BRAND NAME(S): Catapres What should I tell my health care provider before I take this medicine? They need to know if you have any of these conditions: -kidney disease -an unusual or allergic reaction to clonidine, other medicines, foods, dyes, or preservatives -pregnant or trying to get pregnant -breast-feeding How should I use this medicine? Take this medicine by mouth with a glass of water. Follow the directions on the prescription label. Take your doses at regular intervals. Do not take your medicine more often than directed. Do not suddenly stop taking this medicine. You must gradually reduce the dose or you may get a dangerous increase in blood pressure. Ask your doctor or health care professional for advice. Talk to your pediatrician regarding the use of this medicine in children. Special care may be needed. Overdosage: If you think you have taken too much of this medicine contact a poison control center or emergency room at once. NOTE: This medicine is only for you. Do not share this medicine with others. What if I miss a dose? If you miss a dose, take it as soon as you can. If it is almost time for your next dose, take only that dose. Do not take double or extra doses. What may interact with this medicine? Do not take this medicine with any of the following medications: -MAOIs like Carbex, Eldepryl, Marplan, Nardil, and Parnate This medicine may also interact with the following medications: -barbiturate medicines for inducing sleep or treating seizures like phenobarbital -certain medicines for blood pressure, heart disease, irregular heart beat -certain medicines for depression, anxiety, or psychotic disturbances -prescription pain medicines This list may not describe all possible interactions. Give your health care provider a list of all  the medicines, herbs, non-prescription drugs, or dietary supplements you use. Also tell them if you smoke, drink alcohol, or use illegal drugs. Some items may interact with your medicine. What should I watch for while using this medicine? Visit your doctor or health care professional for regular checks on your progress. Check your heart rate and blood pressure regularly while you are taking this medicine. Ask your doctor or health care professional what your heart rate should be and when you should contact him or her. You may get drowsy or dizzy. Do not drive, use machinery, or do anything that needs mental alertness until you know how this medicine affects you. To avoid dizzy or fainting spells, do not stand or sit up quickly, especially if you are an older person. Alcohol can make you more drowsy and dizzy. Avoid alcoholic drinks. Your mouth may get dry. Chewing sugarless gum or sucking hard candy, and drinking plenty of water will help. Do not treat yourself for coughs, colds, or pain while you are taking this medicine without asking your doctor or health care professional for advice. Some ingredients may increase your blood pressure. If you are going to have surgery tell your doctor or health care professional that you are taking this medicine. What side effects may I notice from receiving this medicine? Side effects that you should report to your doctor or health care professional  as soon as possible: -allergic reactions like skin rash, itching or hives, swelling of the face, lips, or tongue -anxiety, nervousness -chest pain -depression -fast, irregular heartbeat -swelling of feet or legs -unusually weak or tired Side effects that usually do not require medical attention (report to your doctor or health care professional if they continue or are bothersome): -change in sex drive or performance -constipation -headache This list may not describe all possible side effects. Call your doctor for  medical advice about side effects. You may report side effects to FDA at 1-800-FDA-1088. Where should I keep my medicine? Keep out of the reach of children. Store at room temperature between 15 and 30 degrees C (59 and 86 degrees F). Protect from light. Keep container tightly closed. Throw away any unused medicine after the expiration date. NOTE: This sheet is a summary. It may not cover all possible information. If you have questions about this medicine, talk to your doctor, pharmacist, or health care provider.  2014, [employer] Standard. (2010-12-19 13:01:28)  Lorazepam tablets What is this medicine? LORAZEPAM (lor A ze pam) is a benzodiazepine. It is used to treat anxiety. This medicine may be used for other purposes; ask your health care provider or pharmacist if you have questions. COMMON BRAND NAME(S): Ativan What should I tell my health care provider before I take this medicine? They need to know if you have any of these conditions: -alcohol or drug abuse problem -bipolar disorder, depression, psychosis or other mental health condition -glaucoma -kidney or liver disease -lung disease or breathing difficulties -myasthenia gravis -Parkinson's disease -seizures or a history of seizures -suicidal thoughts -an unusual or allergic reaction to lorazepam, other benzodiazepines, foods, dyes, or preservatives -pregnant or trying to get pregnant -breast-feeding How should I use this medicine? Take this medicine by mouth with a glass of water. Follow the directions on the prescription label. If it upsets your stomach, take it with food or milk. Take your medicine at regular intervals. Do not take it more often than directed. Do not stop taking except on the advice of your doctor or health care professional. Talk to your pediatrician regarding the use of this medicine in children. Special care may be needed. Overdosage: If you think you have taken too much of this medicine contact a poison  control center or emergency room at once. NOTE: This medicine is only for you. Do not share this medicine with others. What if I miss a dose? If you miss a dose, take it as soon as you can. If it is almost time for your next dose, take only that dose. Do not take double or extra doses. What may interact with this medicine? -barbiturate medicines for inducing sleep or treating seizures, like phenobarbital -clozapine -medicines for depression, mental problems or psychiatric disturbances -medicines for sleep -phenytoin -probenecid -theophylline -valproic acid This list may not describe all possible interactions. Give your health care provider a list of all the medicines, herbs, non-prescription drugs, or dietary supplements you use. Also tell them if you smoke, drink alcohol, or use illegal drugs. Some items may interact with your medicine. What should I watch for while using this medicine? Visit your doctor or health care professional for regular checks on your progress. Your body may become dependent on this medicine, ask your doctor or health care professional if you still need to take it. However, if you have been taking this medicine regularly for some time, do not suddenly stop taking it. You must gradually reduce the dose  or you may get severe side effects. Ask your doctor or health care professional for advice before increasing or decreasing the dose. Even after you stop taking this medicine it can still affect your body for several days. You may get drowsy or dizzy. Do not drive, use machinery, or do anything that needs mental alertness until you know how this medicine affects you. To reduce the risk of dizzy and fainting spells, do not stand or sit up quickly, especially if you are an older patient. Alcohol may increase dizziness and drowsiness. Avoid alcoholic drinks. Do not treat yourself for coughs, colds or allergies without asking your doctor or health care professional for advice. Some  ingredients can increase possible side effects. What side effects may I notice from receiving this medicine? Side effects that you should report to your doctor or health care professional as soon as possible: -changes in vision -confusion -depression -mood changes, excitability or aggressive behavior -movement difficulty, staggering or jerky movements -muscle cramps -restlessness -weakness or tiredness Side effects that usually do not require medical attention (report to your doctor or health care professional if they continue or are bothersome): -constipation or diarrhea -difficulty sleeping, nightmares -dizziness, drowsiness -headache -nausea, vomiting This list may not describe all possible side effects. Call your doctor for medical advice about side effects. You may report side effects to FDA at 1-800-FDA-1088. Where should I keep my medicine? Keep out of the reach of children. This medicine can be abused. Keep your medicine in a safe place to protect it from theft. Do not share this medicine with anyone. Selling or giving away this medicine is dangerous and against the law. Store at room temperature between 20 and 25 degrees C (68 and 77 degrees F). Protect from light. Keep container tightly closed. Throw away any unused medicine after the expiration date. NOTE: This sheet is a summary. It may not cover all possible information. If you have questions about this medicine, talk to your doctor, pharmacist, or health care provider.  2014, [employer] Standard. (2007-12-25 14:58:20)

## 2013-11-12 NOTE — ED Notes (Signed)
Pt wants to go home, feels relaxed and wants to get in her own bed

## 2013-11-12 NOTE — ED Notes (Signed)
Family at bedside. Patient states that she has a headache at this time. States that she was having chest discomfort when she left home. States that she feels fidgety and anxious also at this time. States she took her nerve pill around 5:30 pm on 11/11/2013. Also states that she feels itchy and restless" feels like something on the inside is not right"

## 2013-11-12 NOTE — ED Notes (Signed)
Pt states her worst issue now is that she's anxious and hasn't slept in 2 days. No relief with xanax yesterday.

## 2013-11-12 NOTE — ED Provider Notes (Signed)
CSN: [id number]     Arrival date & time 11/11/13  2238 History   First MD Initiated Contact with Patient 11/12/13 0301     Chief Complaint  Patient presents with  . Hypertension     (Consider location/radiation/quality/duration/timing/severity/associated sxs/prior Treatment) Patient is a 68 y.o. female presenting with hypertension. The history is provided by the patient.  Hypertension  She comes in because she is unable to get to sleep at home. She had seen her physician 2 days ago at which time she was taken off of tramadol and put on ketorolac. She states that she's been taking 3-5 tramadol a day for several years but was coming off of it because it was no longer effective. Prior to that, he had been taking hydrocodone on a daily basis. Since stopping the tramadol, she has had a headache and has felt nervous and jittery and has had diarrhea. She tried taking a dose of alprazolam with no benefit. She denies fever, chills, sweats. She denies chest pain or dyspnea.  Past Medical History  Diagnosis Date  . IBS (irritable bowel syndrome)     diarrhea predominant  . GERD (gastroesophageal reflux disease)     negative H pylori stool Ag  . HTN (hypertension)   . Chronic back pain   . Tubular adenoma of colon   . Diverticulosis   . Umbilical hernia   . Hyperplastic colon polyp 11/08/08  . Arthritis   . Wears glasses    Past Surgical History  Procedure Laterality Date  . Cholecystectomy    . Nephrectomy  1975    left-infection  . Cervical disc surgery    . Ectopic pregnancy surgery      bilat  . Appendectomy    . Abdominal hysterectomy    . Colonoscopy  11/08/08    Dr. Gala Romney- hemorrhoids, pancolonic diverticula,,hyperplastic polyp  . Colonoscopy   08/05/2005    GLO:VFIEPP rectum/Diminutive polyp at 63 cm/Pancolonic diverticula/benign-appearing ulcer in the mid descending colon  . Colonoscopy N/A 03/30/2013    IRJ:JOACZYS anal canal hemorrhoids. Single colonic polyp-removed as found  above. Colonic diverticulosis.  . Trigger finger release Right 08/23/2013    Procedure: RIGHT INDEX FINGER TRIGGER RELEASE ;  Surgeon: Jolyn Nap, MD;  Location: [place];  Service: Orthopedics;  Laterality: Right;  . Carpal tunnel release Right 08/23/2013    Procedure: RIGHT ENDOSCOPIC CARPAL  TUNNEL RELEASE;  Surgeon: Jolyn Nap, MD;  Location: [place];  Service: Orthopedics;  Laterality: Right;   Family History  Problem Relation Age of Onset  . Cancer Brother 60    gastric  . Heart attack Mother    History  Substance Use Topics  . Smoking status: Former Smoker    Quit date: 08/19/2000  . Smokeless tobacco: Not on file     Comment: Quit x 12 years  . Alcohol Use: No     Comment: maybe one a month wine   OB History   Grav Para Term Preterm Abortions TAB SAB Ect Mult Living                 Review of Systems  All other systems reviewed and are negative.     Allergies  Morphine and related  Home Medications   Prior to Admission medications   Medication Sig Start Date End Date Taking? Authorizing Provider  ALPRAZolam Duanne Moron) 0.5 MG tablet Take 0.5 mg by mouth daily as needed. For anxiety or sleep   Yes Historical Provider,  MD  amLODipine-valsartan (EXFORGE) 5-160 MG per tablet Take 1 tablet by mouth daily.   Yes Historical Provider, MD  aspirin EC 81 MG tablet Take 81 mg by mouth every morning.   Yes Historical Provider, MD  furosemide (LASIX) 20 MG tablet Take 20 mg by mouth at bedtime.  11/13/10  Yes Historical Provider, MD  omeprazole (PRILOSEC) 20 MG capsule Take 20 mg by mouth daily as needed. For acid reflux   Yes Historical Provider, MD  potassium chloride SA (K-DUR,KLOR-CON) 20 MEQ tablet Take 20 mEq by mouth daily.   Yes Historical Provider, MD  rosuvastatin (CRESTOR) 5 MG tablet Take 5 mg by mouth at bedtime.    Yes Historical Provider, MD  estradiol (CLIMARA - DOSED IN MG/24 HR) 0.05 mg/24hr Place 1 patch onto the skin  once a week.  11/11/10   Historical Provider, MD  HYDROcodone-acetaminophen (NORCO) 5-325 MG per tablet Take 1-2 tablets by mouth every 4 (four) hours as needed. 08/23/13   Jolyn Nap, MD  Linaclotide [facility]) 145 MCG CAPS capsule Take 1 capsule (145 mcg total) by mouth daily before breakfast. 05/26/13   Mahala Menghini, PA-C  NON FORMULARY Gabapentin   Unsure mg    Once daily    Historical Provider, MD  traMADol (ULTRAM) 50 MG tablet Take 50 mg by mouth every 8 (eight) hours as needed. For pain 12/07/11   Historical Provider, MD   BP 145/90  Pulse 99  Temp(Src) 97.8 F (36.6 C) (Oral)  Resp 11  Ht 5\' 8"  (1.727 m)  Wt 182 lb (82.555 kg)  BMI 27.68 kg/m2  SpO2 99% Physical Exam  Nursing note and vitals reviewed.  68 year old female, resting comfortably and in no acute distress. Vital signs are significant for hypertension with blood pressure 156/101. Oxygen saturation is 97%, which is normal. Head is normocephalic and atraumatic. PERRLA, EOMI. Oropharynx is clear. Neck is nontender and supple without adenopathy or JVD. Back is nontender and there is no CVA tenderness. Lungs are clear without rales, wheezes, or rhonchi. Chest is nontender. Heart has regular rate and rhythm without murmur. Abdomen is soft, flat, nontender without masses or hepatosplenomegaly and peristalsis is normoactive. Extremities have no cyanosis or edema, full range of motion is present. Skin is warm and dry without rash. Neurologic: Mental status is normal, cranial nerves are intact, there are no motor or sensory deficits.  ED Course  Procedures (including critical care time)   EKG Interpretation   Date/Time:  Friday Nov 12 2013 01:20:13 EDT Ventricular Rate:  82 PR Interval:  143 QRS Duration: 105 QT Interval:  380 QTC Calculation: 444 R Axis:   13 Text Interpretation:  Sinus rhythm Left atrial enlargement Abnormal R-wave  progression, early transition When compared with ECG of 08/23/2013, No   significant change was found Confirmed by [facility]  MD, Dionysios Massman ([id number]) on  11/12/2013 3:02:09 AM      MDM   Final diagnoses:  Narcotic withdrawal  Hypertension    Symptom complex which she seems most consistent with narcotic withdrawal. She has been on a moderate dose of tramadol for several years. She will be given him a dose of loperamide for diarrhea and lorazepam for jitteriness and also a dose of clonidine and will be reassessed.  She feels much better after above noted treatment and she is discharged with prescriptions for clonidine and lorazepam for the next five days.  Delora Fuel, MD 63/89/37 [id number]

## 2013-12-19 ENCOUNTER — Encounter: Payer: Self-pay | Admitting: Internal Medicine

## 2013-12-31 ENCOUNTER — Encounter: Payer: Self-pay | Admitting: Cardiovascular Disease

## 2014-01-21 ENCOUNTER — Other Ambulatory Visit (HOSPITAL_COMMUNITY): Payer: Self-pay | Admitting: Family Medicine

## 2014-01-21 DIAGNOSIS — Z1231 Encounter for screening mammogram for malignant neoplasm of breast: Secondary | ICD-10-CM

## 2014-01-26 ENCOUNTER — Ambulatory Visit (HOSPITAL_COMMUNITY)
Admission: RE | Admit: 2014-01-26 | Discharge: 2014-01-26 | Disposition: A | Discharge status: Home / Self Care | Payer: Medicare PPO | Source: Ambulatory Visit | Attending: Family Medicine | Admitting: Family Medicine

## 2014-01-26 DIAGNOSIS — Z1231 Encounter for screening mammogram for malignant neoplasm of breast: Secondary | ICD-10-CM | POA: Insufficient documentation

## 2014-04-12 ENCOUNTER — Encounter: Payer: Self-pay | Admitting: Internal Medicine

## 2014-04-12 ENCOUNTER — Ambulatory Visit (INDEPENDENT_AMBULATORY_CARE_PROVIDER_SITE_OTHER): Payer: Medicare PPO | Admitting: Internal Medicine

## 2014-04-12 ENCOUNTER — Encounter (INDEPENDENT_AMBULATORY_CARE_PROVIDER_SITE_OTHER): Payer: Self-pay

## 2014-04-12 VITALS — BP 112/74 | HR 81 | Temp 97.4°F | Ht 67.0 in | Wt 182.4 lb

## 2014-04-12 DIAGNOSIS — D126 Benign neoplasm of colon, unspecified: Secondary | ICD-10-CM

## 2014-04-12 DIAGNOSIS — K219 Gastro-esophageal reflux disease without esophagitis: Secondary | ICD-10-CM

## 2014-04-12 DIAGNOSIS — K5909 Other constipation: Secondary | ICD-10-CM

## 2014-04-12 NOTE — Patient Instructions (Signed)
Use Prilosec and linzess as directed  Repeat colonoscopy /office visit in 4 years and follow-up here as needed

## 2014-04-12 NOTE — Progress Notes (Signed)
Primary Care Physician:  Maggie Font, MD Primary Gastroenterologist:  Dr. Gala Romney  Pre-Procedure History & Physical: HPI:  [name] is a 68 y.o. female here for followup of GERD and constipation. Patient managing her GERD and bowel symptoms well with dietary modification / fiber supplementation. Doesn't need take Prilosec at all daily - just occasionally. Reflux symptoms by and large well-controlled. Only takes Linzess on occasion as well; she is really backed off on  both these agents. She seems to understand dietary and environmental triggers. She really has rare  bouts of diarrhea as she describes. Colonoscopy last year demonstrated a small adenoma; she'll be due for surveillance examination in 4 years from now. She tells me that her neurologist recently diagnosed her with mild Parkinson's disease.  Past Medical History  Diagnosis Date  . IBS (irritable bowel syndrome)     diarrhea predominant  . GERD (gastroesophageal reflux disease)     negative H pylori stool Ag  . HTN (hypertension)   . Chronic back pain   . Tubular adenoma of colon   . Diverticulosis   . Umbilical hernia   . Hyperplastic colon polyp 11/08/08  . Arthritis   . Wears glasses     Past Surgical History  Procedure Laterality Date  . Cholecystectomy    . Nephrectomy  1975    left-infection  . Cervical disc surgery    . Ectopic pregnancy surgery      bilat  . Appendectomy    . Abdominal hysterectomy    . Colonoscopy  11/08/08    Dr. Gala Romney- hemorrhoids, pancolonic diverticula,,hyperplastic polyp  . Colonoscopy   08/05/2005    XAJ:OINOMV rectum/Diminutive polyp at 24 cm/Pancolonic diverticula/benign-appearing ulcer in the mid descending colon  . Colonoscopy N/A 03/30/2013    EHM:CNOBSJG anal canal hemorrhoids. Single colonic polyp-removed as found above. Colonic diverticulosis.  . Trigger finger release Right 08/23/2013    Procedure: RIGHT INDEX FINGER TRIGGER RELEASE ;  Surgeon: Jolyn Nap, MD;   Location: [place];  Service: Orthopedics;  Laterality: Right;  . Carpal tunnel release Right 08/23/2013    Procedure: RIGHT ENDOSCOPIC CARPAL  TUNNEL RELEASE;  Surgeon: Jolyn Nap, MD;  Location: [place];  Service: Orthopedics;  Laterality: Right;    Prior to Admission medications   Medication Sig Start Date End Date Taking? Authorizing Provider  ALPRAZolam Duanne Moron) 0.5 MG tablet Take 0.5 mg by mouth daily as needed. For anxiety or sleep   Yes Historical Provider, MD  amLODipine-valsartan (EXFORGE) 5-160 MG per tablet Take 1 tablet by mouth daily.   Yes Historical Provider, MD  furosemide (LASIX) 20 MG tablet Take 20 mg by mouth at bedtime.  11/13/10  Yes Historical Provider, MD  meloxicam (MOBIC) 15 MG tablet Take 15 mg by mouth daily.  03/12/14  Yes Historical Provider, MD  omeprazole (PRILOSEC) 20 MG capsule Take 20 mg by mouth daily as needed. For acid reflux   Yes Historical Provider, MD  potassium chloride SA (K-DUR,KLOR-CON) 20 MEQ tablet Take 20 mEq by mouth daily.   Yes Historical Provider, MD  traMADol (ULTRAM) 50 MG tablet Take 50 mg by mouth every 8 (eight) hours as needed. For pain 12/07/11  Yes Historical Provider, MD  ZETIA 10 MG tablet Take 10 mg by mouth daily.  03/03/14  Yes Historical Provider, MD  aspirin EC 81 MG tablet Take 81 mg by mouth every morning.    Historical Provider, MD  cloNIDine (CATAPRES) 0.1 MG tablet  Take 1 tablet (0.1 mg total) by mouth 3 (three) times daily. 03/11/16   Delora Fuel, MD  HYDROcodone-acetaminophen (NORCO) 5-325 MG per tablet Take 1-2 tablets by mouth every 4 (four) hours as needed. 08/23/13   Jolyn Nap, MD  Linaclotide [facility]) 145 MCG CAPS capsule Take 1 capsule (145 mcg total) by mouth daily before breakfast. 05/26/13   Mahala Menghini, PA-C  LORazepam (ATIVAN) 1 MG tablet Take 1 tablet (1 mg total) by mouth 3 (three) times daily as needed for anxiety. 4/0/81   Delora Fuel, MD  NON FORMULARY Gabapentin    Unsure mg    Once daily    Historical Provider, MD  rosuvastatin (CRESTOR) 5 MG tablet Take 5 mg by mouth at bedtime.     Historical Provider, MD    Allergies as of 04/12/2014 - Review Complete 04/12/2014  Allergen Reaction Noted  . Morphine and related Hypertension 11/11/2013    Family History  Problem Relation Age of Onset  . Cancer Brother 60    gastric  . Heart attack Mother     History   Social History  . Marital Status: Married    Spouse Name: N/A    Number of Children: N/A  . 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 Smoking status: Former Smoker    Quit date: 08/19/2000  . Smokeless tobacco: Not on file     Comment: Quit x 12 years  . Alcohol Use: No     Comment: maybe one a month wine  . Drug Use: No  . Sexual Activity: Yes    Birth Control/ Protection: Surgical   Other Topics Concern  . Not on file   Social History Narrative  . No narrative on file    Review of Systems: See HPI, otherwise negative ROS  Physical Exam: BP 112/74  Pulse 81  Temp(Src) 97.4 F (36.3 C) (Oral)  Ht 5\' 7"  (1.702 m)  Wt 182 lb 6.4 oz (82.736 kg)  BMI 28.56 kg/m2 General:   Alert,  Well-developed, well-nourished, pleasant and cooperative in NAD Skin:  Intact without significant lesions or rashes. Eyes:  Sclera clear, no icterus.   Conjunctiva pink. Ears:  Normal auditory acuity. Nose:  No deformity, discharge,  or lesions. Mouth:  No deformity or lesions. Neck:  Supple; no masses or thyromegaly. No significant cervical adenopathy. Lungs:  Clear throughout to auscultation.   No wheezes, crackles, or rhonchi. No acute distress. Heart:  Regular rate and rhythm; no murmurs, clicks, rubs,  or gallops. Abdomen: Non-distended, normal bowel sounds.  Soft and nontender without appreciable mass or hepatosplenomegaly.  Pulses:  Normal pulses noted. Extremities:  Without clubbing or edema.  Impression:  Pleasant  67 year old lady with a chronic constipation and rare episodes of diarrhea along with GERD well-controlled with the dietary discretion. Colonic adenoma removed at colonoscopy last year. Overall, this much lady is doing very well from a GI standpoint.  No further evaluation needed at this time.   Recommendations:   Use Prilosec and linzess as directed Repeat colonoscopy /office visit in 4 years and follow-up here as needed    Notice: This dictation was prepared with Dragon dictation along with smaller phrase technology. Any transcriptional errors that result from this process are unintentional and may not be corrected upon review.

## 2014-06-09 ENCOUNTER — Emergency Department (HOSPITAL_COMMUNITY): Payer: Medicare PPO

## 2014-06-09 ENCOUNTER — Encounter: Payer: Self-pay | Admitting: Orthopedic Surgery

## 2014-06-09 ENCOUNTER — Emergency Department (HOSPITAL_COMMUNITY)
Admission: EM | Admit: 2014-06-09 | Discharge: 2014-06-09 | Disposition: A | Discharge status: Home / Self Care | Payer: Medicare PPO | Attending: Emergency Medicine | Admitting: Emergency Medicine

## 2014-06-09 ENCOUNTER — Ambulatory Visit (INDEPENDENT_AMBULATORY_CARE_PROVIDER_SITE_OTHER): Payer: Medicare PPO | Admitting: Orthopedic Surgery

## 2014-06-09 ENCOUNTER — Encounter (HOSPITAL_COMMUNITY): Payer: Self-pay

## 2014-06-09 VITALS — BP 108/73 | Ht 67.0 in | Wt 178.0 lb

## 2014-06-09 DIAGNOSIS — M79676 Pain in unspecified toe(s): Secondary | ICD-10-CM

## 2014-06-09 DIAGNOSIS — W228XXA Striking against or struck by other objects, initial encounter: Secondary | ICD-10-CM | POA: Insufficient documentation

## 2014-06-09 DIAGNOSIS — K219 Gastro-esophageal reflux disease without esophagitis: Secondary | ICD-10-CM | POA: Insufficient documentation

## 2014-06-09 DIAGNOSIS — Y998 Other external cause status: Secondary | ICD-10-CM | POA: Insufficient documentation

## 2014-06-09 DIAGNOSIS — Z791 Long term (current) use of non-steroidal anti-inflammatories (NSAID): Secondary | ICD-10-CM | POA: Insufficient documentation

## 2014-06-09 DIAGNOSIS — S99922A Unspecified injury of left foot, initial encounter: Secondary | ICD-10-CM | POA: Diagnosis present

## 2014-06-09 DIAGNOSIS — S92512A Displaced fracture of proximal phalanx of left lesser toe(s), initial encounter for closed fracture: Secondary | ICD-10-CM | POA: Insufficient documentation

## 2014-06-09 DIAGNOSIS — Z87891 Personal history of nicotine dependence: Secondary | ICD-10-CM | POA: Diagnosis not present

## 2014-06-09 DIAGNOSIS — W2203XA Walked into furniture, initial encounter: Secondary | ICD-10-CM

## 2014-06-09 DIAGNOSIS — S92912A Unspecified fracture of left toe(s), initial encounter for closed fracture: Secondary | ICD-10-CM

## 2014-06-09 DIAGNOSIS — Y9389 Activity, other specified: Secondary | ICD-10-CM | POA: Insufficient documentation

## 2014-06-09 DIAGNOSIS — Z79899 Other long term (current) drug therapy: Secondary | ICD-10-CM | POA: Diagnosis not present

## 2014-06-09 DIAGNOSIS — Z86010 Personal history of colonic polyps: Secondary | ICD-10-CM | POA: Diagnosis not present

## 2014-06-09 DIAGNOSIS — Z7982 Long term (current) use of aspirin: Secondary | ICD-10-CM | POA: Diagnosis not present

## 2014-06-09 DIAGNOSIS — M1990 Unspecified osteoarthritis, unspecified site: Secondary | ICD-10-CM | POA: Diagnosis not present

## 2014-06-09 DIAGNOSIS — G8929 Other chronic pain: Secondary | ICD-10-CM | POA: Diagnosis not present

## 2014-06-09 DIAGNOSIS — Z86018 Personal history of other benign neoplasm: Secondary | ICD-10-CM | POA: Insufficient documentation

## 2014-06-09 DIAGNOSIS — I10 Essential (primary) hypertension: Secondary | ICD-10-CM | POA: Diagnosis not present

## 2014-06-09 DIAGNOSIS — Y9289 Other specified places as the place of occurrence of the external cause: Secondary | ICD-10-CM | POA: Insufficient documentation

## 2014-06-09 DIAGNOSIS — Z973 Presence of spectacles and contact lenses: Secondary | ICD-10-CM | POA: Insufficient documentation

## 2014-06-09 MED ORDER — HYDROCODONE-ACETAMINOPHEN 5-325 MG PO TABS: 1.0000 | ORAL_TABLET | ORAL | Status: DC | PRN

## 2014-06-09 MED ORDER — OXYCODONE-ACETAMINOPHEN 5-325 MG PO TABS
1.0000 | ORAL_TABLET | Freq: Once | ORAL | Status: AC
  Administered 2014-06-09: 1 via ORAL
  Filled 2014-06-09: qty 1

## 2014-06-09 MED ORDER — OXYCODONE-ACETAMINOPHEN 5-325 MG PO TABS: 1.0000 | ORAL_TABLET | Freq: Four times a day (QID) | ORAL | Status: DC | PRN

## 2014-06-09 NOTE — ED Provider Notes (Signed)
CSN: [id number]     Arrival date & time 06/09/14  0006 History  This chart was scribed for [employer]. [name], MD by Tula Nakayama, ED Scribe. This patient was seen in room APA05/APA05 and the patient's care was started at 12:31 AM.    Chief Complaint  Patient presents with  . Toe Pain   The history is provided by the patient. No language interpreter was used.    HPI Comments: [name] is a 68 y.o. female who presents to the Emergency Department complaining of constant, left 4th and 5th toe pain that occurred earlier today after she ran into a chair. She notes swelling and pain with walking as associated symptoms. Pt tried Tramadol PTA with no relief to symptoms. She denies any other injuries.  Past Medical History  Diagnosis Date  . IBS (irritable bowel syndrome)     diarrhea predominant  . GERD (gastroesophageal reflux disease)     negative H pylori stool Ag  . HTN (hypertension)   . Chronic back pain   . Tubular adenoma of colon   . Diverticulosis   . Umbilical hernia   . Hyperplastic colon polyp 11/08/08  . Arthritis   . Wears glasses    Past Surgical History  Procedure Laterality Date  . Cholecystectomy    . Nephrectomy  1975    left-infection  . Cervical disc surgery    . Ectopic pregnancy surgery      bilat  . Appendectomy    . Abdominal hysterectomy    . Colonoscopy  11/08/08    Dr. Gala Romney- hemorrhoids, pancolonic diverticula,,hyperplastic polyp  . Colonoscopy   08/05/2005    JGG:EZMOQH rectum/Diminutive polyp at 11 cm/Pancolonic diverticula/benign-appearing ulcer in the mid descending colon  . Colonoscopy N/A 03/30/2013    UTM:LYYTKPT anal canal hemorrhoids. Single colonic polyp-removed as found above. Colonic diverticulosis.  . Trigger finger release Right 08/23/2013    Procedure: RIGHT INDEX FINGER TRIGGER RELEASE ;  Surgeon: Jolyn Nap, MD;  Location: [place];  Service: Orthopedics;  Laterality: Right;  . Carpal tunnel release Right  08/23/2013    Procedure: RIGHT ENDOSCOPIC CARPAL  TUNNEL RELEASE;  Surgeon: Jolyn Nap, MD;  Location: [place];  Service: Orthopedics;  Laterality: Right;   Family History  Problem Relation Age of Onset  . Cancer Brother 60    gastric  . Heart attack Mother    History  Substance Use Topics  . Smoking status: Former Smoker    Quit date: 08/19/2000  . Smokeless tobacco: Not on file     Comment: Quit x 12 years  . Alcohol Use: No     Comment: maybe one a month wine   OB History    No data available     Review of Systems  Musculoskeletal: Positive for joint swelling and arthralgias.  Skin: Negative for color change and wound.  All other systems reviewed and are negative.  Allergies  Morphine and related  Home Medications   Prior to Admission medications   Medication Sig Start Date End Date Taking? Authorizing Provider  ALPRAZolam Duanne Moron) 0.5 MG tablet Take 0.5 mg by mouth daily as needed. For anxiety or sleep    Historical Provider, MD  amLODipine-valsartan (EXFORGE) 5-160 MG per tablet Take 1 tablet by mouth daily.    Historical Provider, MD  aspirin EC 81 MG tablet Take 81 mg by mouth every morning.    Historical Provider, MD  cloNIDine (CATAPRES) 0.1 MG tablet Take  1 tablet (0.1 mg total) by mouth 3 (three) times daily. 03/10/22   Delora Fuel, MD  furosemide (LASIX) 20 MG tablet Take 20 mg by mouth at bedtime.  11/13/10   Historical Provider, MD  HYDROcodone-acetaminophen (NORCO) 5-325 MG per tablet Take 1-2 tablets by mouth every 4 (four) hours as needed. 08/23/13   Jolyn Nap, MD  Linaclotide [facility]) 145 MCG CAPS capsule Take 1 capsule (145 mcg total) by mouth daily before breakfast. 05/26/13   Mahala Menghini, PA-C  LORazepam (ATIVAN) 1 MG tablet Take 1 tablet (1 mg total) by mouth 3 (three) times daily as needed for anxiety. 11/10/71   Delora Fuel, MD  meloxicam (MOBIC) 15 MG tablet Take 15 mg by mouth daily.  03/12/14   Historical Provider, MD  NON  FORMULARY Gabapentin   Unsure mg    Once daily    Historical Provider, MD  omeprazole (PRILOSEC) 20 MG capsule Take 20 mg by mouth daily as needed. For acid reflux    Historical Provider, MD  oxyCODONE-acetaminophen (PERCOCET/ROXICET) 5-325 MG per tablet Take 1-2 tablets by mouth every 6 (six) hours as needed for severe pain. 06/09/14   Jasper Riling. Yitzchak Kothari, MD  potassium chloride SA (K-DUR,KLOR-CON) 20 MEQ tablet Take 20 mEq by mouth daily.    Historical Provider, MD  rosuvastatin (CRESTOR) 5 MG tablet Take 5 mg by mouth at bedtime.     Historical Provider, MD  traMADol (ULTRAM) 50 MG tablet Take 50 mg by mouth every 8 (eight) hours as needed. For pain 12/07/11   Historical Provider, MD  ZETIA 10 MG tablet Take 10 mg by mouth daily.  03/03/14   Historical Provider, MD   BP 165/88 mmHg  Pulse 74  Temp(Src) 98.8 F (37.1 C) (Oral)  Resp 15  Ht 5\' 7"  (1.702 m)  Wt 178 lb (80.74 kg)  BMI 27.87 kg/m2  SpO2 98% Physical Exam  Constitutional: She appears well-developed and well-nourished. No distress.  HENT:  Head: Normocephalic and atraumatic.  Eyes: Conjunctivae and EOM are normal.  Neck: Neck supple. No tracheal deviation present.  Cardiovascular: Normal rate.   Pulmonary/Chest: Effort normal. No respiratory distress.  Musculoskeletal: She exhibits tenderness.  No tenderness over ankle or lower leg; tender over base of 4th toe; no tenderness over 5th metatarsal; skin intact.  Skin: Skin is warm and dry.  Psychiatric: She has a normal mood and affect. Her behavior is normal.  Nursing note and vitals reviewed.   ED Course  Procedures (including critical care time)  COORDINATION OF CARE: 12:35 AM Discussed treatment plan with pt which includes an x-ray of foot. Pt agreed to plan.  Labs Review Labs Reviewed - No data to display  Imaging Review Dg Foot Complete Left  06/09/2014   CLINICAL DATA:  Hit left foot on chair tonight, with pain at the third and fourth toes of the left foot.  Initial encounter.  EXAM: LEFT FOOT - COMPLETE 3+ VIEW  COMPARISON:  None.  FINDINGS: There is a minimally displaced oblique fracture through the fourth proximal phalanx. This may extend to both the proximal and distal joint spaces, not well characterized on this study. The joint spaces are otherwise preserved. There is no evidence of talar subluxation; the subtalar joint is unremarkable in appearance.  No significant soft tissue abnormalities are seen.  IMPRESSION: Minimally displaced oblique fracture through the fourth proximal phalanx. Question of intra-articular extension both proximally and distally.   Electronically Signed   By: Francoise Schaumann.D.  On: 06/09/2014 01:46     EKG Interpretation None      MDM   Final diagnoses:  Toe pain  Toe fracture, left, closed, initial encounter    Patient with toe fracture after direct injury. Closed. Given pain medicine buddy tape and ortho follow up   I personally performed the services described in this documentation, which was scribed in my presence. The recorded information has been reviewed and is accurate.       Jasper Riling. [name], MD 06/09/14 [phone]

## 2014-06-09 NOTE — Discharge Instructions (Signed)

## 2014-06-09 NOTE — Progress Notes (Signed)
Patient ID: [name], female   DOB: [birth year], 68 y.o.   MRN: [id number] Patient ID: [name], female   DOB: 1945-11-19, 68 y.o.   MRN: [id number]  Chief Complaint  Patient presents with  . Foot Injury    4th toe fracture of left foot, doi 06/08/14    HPI [name] is a 68 y.o. female. HPI Comments: The patient hit a chair with her foot complains of pain in the third and fourth lesser digits of the left foot aching quality mild to moderately severe with no deformity and denies numbness or tingling  Foot Injury     Past Medical History  Diagnosis Date  . IBS (irritable bowel syndrome)     diarrhea predominant  . GERD (gastroesophageal reflux disease)     negative H pylori stool Ag  . HTN (hypertension)   . Chronic back pain   . Tubular adenoma of colon   . Diverticulosis   . Umbilical hernia   . Hyperplastic colon polyp 11/08/08  . Arthritis   . Wears glasses     Past Surgical History  Procedure Laterality Date  . Cholecystectomy    . Nephrectomy  1975    left-infection  . Cervical disc surgery    . Ectopic pregnancy surgery      bilat  . Appendectomy    . Abdominal hysterectomy    . Colonoscopy  11/08/08    Dr. Gala Romney- hemorrhoids, pancolonic diverticula,,hyperplastic polyp  . Colonoscopy   08/05/2005    [id number] rectum/Diminutive polyp at 16 cm/Pancolonic diverticula/benign-appearing ulcer in the mid descending colon  . Colonoscopy N/A 03/30/2013    [id number] anal canal hemorrhoids. Single colonic polyp-removed as found above. Colonic diverticulosis.  . Trigger finger release Right 08/23/2013    Procedure: RIGHT INDEX FINGER TRIGGER RELEASE ;  Surgeon: Jolyn Nap, MD;  Location: [facility];  Service: Orthopedics;  Laterality: Right;  . Carpal tunnel release Right 08/23/2013    Procedure: RIGHT ENDOSCOPIC CARPAL  TUNNEL RELEASE;  Surgeon: Jolyn Nap, MD;  Location: [place];  Service: Orthopedics;  Laterality:  Right;    Family History  Problem Relation Age of Onset  . Cancer Brother 60    gastric  . Heart attack Mother     Social History History  Substance Use Topics  . Smoking status: Former Smoker    Quit date: 08/19/2000  . Smokeless tobacco: Not on file     Comment: Quit x 12 years  . Alcohol Use: No     Comment: maybe one a month wine    Allergies  Allergen Reactions  . Morphine And Related Hypertension    Current Outpatient Prescriptions  Medication Sig Dispense Refill  . ALPRAZolam (XANAX) 0.5 MG tablet Take 0.5 mg by mouth daily as needed. For anxiety or sleep    . amLODipine-valsartan (EXFORGE) 5-160 MG per tablet Take 1 tablet by mouth daily.    [name] aspirin EC 81 MG tablet Take 81 mg by mouth every morning.    . furosemide (LASIX) 20 MG tablet Take 20 mg by mouth at bedtime.     . Linaclotide (LINZESS) 145 MCG CAPS capsule Take 1 capsule (145 mcg total) by mouth daily before breakfast. 30 capsule 5  . omeprazole (PRILOSEC) 20 MG capsule Take 20 mg by mouth daily as needed. For acid reflux    . oxyCODONE-acetaminophen (PERCOCET/ROXICET) 5-325 MG per tablet Take 1-2 tablets by mouth every 6 (six) hours as needed  for severe pain. 15 tablet 0  . traMADol (ULTRAM) 50 MG tablet Take 50 mg by mouth every 8 (eight) hours as needed. For pain    . ZETIA 10 MG tablet Take 10 mg by mouth daily.     . cloNIDine (CATAPRES) 0.1 MG tablet Take 1 tablet (0.1 mg total) by mouth 3 (three) times daily. (Patient not taking: Reported on 06/09/2014) 15 tablet 0  . HYDROcodone-acetaminophen (NORCO) 5-325 MG per tablet Take 1-2 tablets by mouth every 4 (four) hours as needed. (Patient not taking: Reported on 06/09/2014) 60 tablet 0  . HYDROcodone-acetaminophen (NORCO/VICODIN) 5-325 MG per tablet Take 1 tablet by mouth every 4 (four) hours as needed for moderate pain. 6 tablet 0  . LORazepam (ATIVAN) 1 MG tablet Take 1 tablet (1 mg total) by mouth 3 (three) times daily as needed for anxiety. (Patient  not taking: Reported on 06/09/2014) 15 tablet 0  . meloxicam (MOBIC) 15 MG tablet Take 15 mg by mouth daily.     . NON FORMULARY Gabapentin   Unsure mg    Once daily    . potassium chloride SA (K-DUR,KLOR-CON) 20 MEQ tablet Take 20 mEq by mouth daily.    . rosuvastatin (CRESTOR) 5 MG tablet Take 5 mg by mouth at bedtime.      No current facility-administered medications for this visit.    Review of Systems Review of Systems She does not have fever chills or numbness or tingling in the toe Blood pressure 108/73, height 5\' 7"  (1.702 m), weight 178 lb (80.74 kg).  Physical Exam Physical Exam Her vital signs are stable. She is ambulatory with a crutch and hard sole shoe. She is awake alert and oriented mood and affect are normal. She is well-groomed. She has tenderness without deformity of the fourth lesser toe at the proximal phalanx. She has painful restricted range of motion with no instability. Acetone is normal in the foot without atrophy. Scans intact with some ecchymosis. She has good color and the toe normal dorsalis pedis pulse and normal sensation   Data Reviewed I have independently interpreted the x-rays a proximal phalanx fracture fourth digit left foot nondisplaced Assessment    Encounter Diagnosis  Name Primary?  . Phalanx fracture, foot, left, closed, initial encounter Yes        Plan    Hard sole shoe 3-4 weeks x-ray when she returns  Meds ordered this encounter  Medications  . HYDROcodone-acetaminophen (NORCO/VICODIN) 5-325 MG per tablet    Sig: Take 1 tablet by mouth every 4 (four) hours as needed for moderate pain.    Dispense:  6 tablet    Refill:  0          Arther Abbott 06/09/2014, 5:21 PM

## 2014-06-09 NOTE — ED Notes (Signed)
I ran into a chair and hurt my left third and fourth toes per pt.

## 2014-06-13 ENCOUNTER — Other Ambulatory Visit: Payer: Self-pay | Admitting: *Deleted

## 2014-06-13 MED ORDER — HYDROCODONE-ACETAMINOPHEN 5-325 MG PO TABS: 1.0000 | ORAL_TABLET | ORAL | Status: DC | PRN

## 2014-06-16 ENCOUNTER — Telehealth: Payer: Self-pay | Admitting: Orthopedic Surgery

## 2014-06-16 NOTE — Telephone Encounter (Signed)
Not sure

## 2014-06-16 NOTE — Telephone Encounter (Signed)
Patient is asking if it is normal for her toe to still be swelling especially in the mornings, please advise?

## 2014-06-16 NOTE — Telephone Encounter (Signed)
Routing to Dr Aline Brochure  FYI:4th toe fracture of left foot, doi 06/08/14

## 2014-06-17 NOTE — Telephone Encounter (Signed)
[name], should she come in before 12/31 to have it checked?

## 2014-06-17 NOTE — Telephone Encounter (Signed)
No need to come in soak the foot in warm water epsom salt for 20 min 3 x a day   (Its a toe fracture: :), :( )  Arbie Cookey please call her [place] Cerise is out for the day

## 2014-06-20 NOTE — Telephone Encounter (Signed)
Called patient, no answer, left vm 

## 2014-06-20 NOTE — Telephone Encounter (Signed)
Relayed to patient the information per Dr Aline Brochure.  States has also discontinued the "footie sock" which has also helped.

## 2014-07-07 ENCOUNTER — Encounter: Payer: Self-pay | Admitting: Orthopedic Surgery

## 2014-07-07 ENCOUNTER — Ambulatory Visit (INDEPENDENT_AMBULATORY_CARE_PROVIDER_SITE_OTHER): Payer: Self-pay | Admitting: Orthopedic Surgery

## 2014-07-07 VITALS — BP 125/78 | Ht 67.0 in | Wt 178.0 lb

## 2014-07-07 DIAGNOSIS — S92912A Unspecified fracture of left toe(s), initial encounter for closed fracture: Secondary | ICD-10-CM

## 2014-07-07 NOTE — Patient Instructions (Signed)
Continue hard sole shoe

## 2014-07-07 NOTE — Progress Notes (Signed)
Chief Complaint  Patient presents with  . Follow-up    4 week recheck on left foot, toe fracture, DOI 06-09-14.    No diagnosis found.  Fracture of the toe left foot fourth digit still complains of pain at the joint. Fracture was very proximal at the MTP joint. Recommend continue hard sole shoe recheck 4 weeks

## 2014-08-04 ENCOUNTER — Ambulatory Visit (INDEPENDENT_AMBULATORY_CARE_PROVIDER_SITE_OTHER): Payer: Self-pay | Admitting: Orthopedic Surgery

## 2014-08-04 ENCOUNTER — Ambulatory Visit (INDEPENDENT_AMBULATORY_CARE_PROVIDER_SITE_OTHER): Payer: Medicare PPO

## 2014-08-04 VITALS — BP 117/69 | Ht 67.0 in | Wt 178.0 lb

## 2014-08-04 DIAGNOSIS — S92912D Unspecified fracture of left toe(s), subsequent encounter for fracture with routine healing: Secondary | ICD-10-CM

## 2014-08-04 NOTE — Progress Notes (Signed)
Patient ID: [name], female   DOB: 1946-06-21, 69 y.o.   MRN: [id number] Encounter Diagnosis  Name Primary?  [name] Toe fracture, left, with routine healing, subsequent encounter Yes   Chief Complaint  Patient presents with  . Follow-up    follow up + xray toe fracture, left foot   BP 117/69 mmHg  Ht 5\' 7"  (1.702 m)  Wt 178 lb (80.74 kg)  BMI 27.87 kg/m2  The patient's toe fracture looks healed on x-ray she has minimal if any clinical symptoms no tenderness at the fracture site  Follow-up as needed

## 2014-08-16 ENCOUNTER — Encounter: Payer: Self-pay | Admitting: Neurology

## 2014-08-16 ENCOUNTER — Ambulatory Visit (INDEPENDENT_AMBULATORY_CARE_PROVIDER_SITE_OTHER): Payer: Medicare PPO | Admitting: Neurology

## 2014-08-16 VITALS — BP 122/84 | HR 81 | Temp 97.1°F | Ht 66.0 in | Wt 188.0 lb

## 2014-08-16 DIAGNOSIS — R251 Tremor, unspecified: Secondary | ICD-10-CM

## 2014-08-16 NOTE — Patient Instructions (Signed)
  Please remember, that any kind of tremor may be exacerbated by anxiety, anger, nervousness, excitement, dehydration, sleep deprivation, by caffeine, and low blood sugar values or blood sugar fluctuations. Some medications, especially some antidepressants and lithium can cause or exacerbate tremors. Tremors may temporarily calm down her subside with the use of a benzodiazepine such as Valium or related medications and with alcohol. Be aware however that drinking alcohol is not an approved treatment or appropriate treatment for tremor control and long-term use of benzodiazepines such as Valium, lorazepam, alprazolam, or clonazepam can cause habit formation, physical and psychological addiction.  I do not detect much in the way of parkinsonian signs at this time. I would recommend that you see Dr. Merlene Laughter as scheduled to keep an eye on your symptoms.

## 2014-08-16 NOTE — Progress Notes (Signed)
Subjective:    Patient ID: [name] is a 69 y.o. female.  HPI     Star Age, MD, PhD [facility] [address] [place], [place] [address]  Dear Dr. Berdine Addison,   I saw your patient, [name], upon your kind request in my neurologic clinic today for second opinion of her Parkinson's disease. The patient is accompanied by her husband today. As you know, [name] is a very friendly 68 year old left-handed woman with an underlying medical history of reflux disease, chronic low back pain, diverticulosis, irritable bowel syndrome, arthritis, status post multiple surgeries including cholecystectomy, left nephrectomy, cervical disc surgery, appendectomy, hysterectomy, trigger finger surgery as well as carpal tunnel release in February 2015, who reports a one year history of intermittent R hand tremor, primarily with posture and action, not at rest. She has had trouble holding a glass or pouring something. She has filled wine during communion at church. She has had some achiness in her right arm and shoulder area. She has not noticed much in the way of balance problems or walking difficulties. She fell on 05/10/2014 but caught her foot against a chair. She broke her left fourth toe. She has no significant memory issues. She has had vivid dreams. She used to have very vivid dreams in high school. She was working third shift at the time and had trouble sleeping during the day. She has had some sleep talking but no dream enactments and no sleepwalking. She has never fallen out of bed. She has no FHx of ET or PD. She has a remote history of headaches and used to go to the [facility] years ago and HAs resolved. She  has seen Dr. Merlene Laughter in [place] for her tremors and was given a Rx for sinemet 25-100 mg on 11/15/13 and was supposed to titrate up from 1 per day to 1 tid, but could not tolerate it and in fact felt bad and depressed. She was on 1  pill once daily for about 1 1/2 weeks, but stopped it. She did not note any improvement in her symptoms. Since then she has not been tried on any other antiparkinsonian medication. She has an appointment with him for follow-up in the spring.  Feeling nervous or anxious makes her tremor worse.    Her Past Medical History Is Significant For: Past Medical History  Diagnosis Date  . IBS (irritable bowel syndrome)     diarrhea predominant  . GERD (gastroesophageal reflux disease)     negative H pylori stool Ag  . HTN (hypertension)   . Chronic back pain   . Tubular adenoma of colon   . Diverticulosis   . Umbilical hernia   . Hyperplastic colon polyp 11/08/08  . Arthritis   . Wears glasses   . Bronchiolitis     acute    Her Past Surgical History Is Significant For: Past Surgical History  Procedure Laterality Date  . Cholecystectomy    . Nephrectomy  1975    left-infection  . Cervical disc surgery    . Ectopic pregnancy surgery      bilat  . Appendectomy    . Abdominal hysterectomy    . Colonoscopy  11/08/08    Dr. Gala Romney- hemorrhoids, pancolonic diverticula,,hyperplastic polyp  . Colonoscopy   08/05/2005    [id number] rectum/Diminutive polyp at 31 cm/Pancolonic diverticula/benign-appearing ulcer in the mid descending colon  . Colonoscopy N/A 03/30/2013    [id number] anal canal hemorrhoids. Single colonic  polyp-removed as found above. Colonic diverticulosis.  . Trigger finger release Right 08/23/2013    Procedure: RIGHT INDEX FINGER TRIGGER RELEASE ;  Surgeon: Jolyn Nap, MD;  Location: [place];  Service: Orthopedics;  Laterality: Right;  . Carpal tunnel release Right 08/23/2013    Procedure: RIGHT ENDOSCOPIC CARPAL  TUNNEL RELEASE;  Surgeon: Jolyn Nap, MD;  Location: [place];  Service: Orthopedics;  Laterality: Right;    Her Family History Is Significant For: Family History  Problem Relation Age of Onset  . Cancer Brother 60     gastric  . Heart attack Mother     Her Social History Is Significant For: History   Social History  . Marital Status: Married    Spouse Name: [name]    Number of Children: 1  . Years of Education: some coll.   Occupational History  . 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 Smoking status: Former Smoker    Quit date: 08/19/2000  . Smokeless tobacco: Never Used     Comment: Quit x 12 years  . Alcohol Use: No     Comment: maybe one a month wine  . Drug Use: No  . Sexual Activity: Yes    Birth Control/ Protection: Surgical   Other Topics Concern  . None   Social History Narrative   Consumes 2 cups of caffeine daily    Her Allergies Are:  Allergies  Allergen Reactions  . Morphine And Related Hypertension  :   Her Current Medications Are:  Outpatient Encounter Prescriptions as of 08/16/2014  Medication Sig  . ALPRAZolam (XANAX) 0.5 MG tablet Take 0.5 mg by mouth daily as needed. For anxiety or sleep  . aspirin EC 81 MG tablet Take 81 mg by mouth every morning.  . carbidopa-levodopa (PARCOPA) 25-100 MG per disintegrating tablet Take 1 tablet by mouth daily.  . Ergocalciferol (VITAMIN D2 PO) Take by mouth.  . furosemide (LASIX) 20 MG tablet Take 20 mg by mouth at bedtime.   . Linaclotide (LINZESS) 145 MCG CAPS capsule Take 1 capsule (145 mcg total) by mouth daily before breakfast. (Patient taking differently: Take 145 mcg by mouth daily before breakfast. As needed)  . LORazepam (ATIVAN) 1 MG tablet Take 1 tablet (1 mg total) by mouth 3 (three) times daily as needed for anxiety.  . meloxicam (MOBIC) 15 MG tablet Take 15 mg by mouth daily.   . NON FORMULARY Gabapentin   Unsure mg    Once daily  . omeprazole (PRILOSEC) 20 MG capsule Take 20 mg by mouth daily as needed. For acid reflux  . traMADol (ULTRAM) 50 MG tablet Take 50 mg by mouth every 8 (eight) hours as needed. For pain  . ZETIA 10 MG tablet Take 10 mg by mouth daily.   [name] amLODipine-valsartan  (EXFORGE) 5-160 MG per tablet Take 1 tablet by mouth daily.  . [DISCONTINUED] amantadine (SYMMETREL) 100 MG capsule Take 100 mg by mouth 2 (two) times daily.  . [DISCONTINUED] guaiFENesin-codeine (ROBITUSSIN AC) 100-10 MG/5ML syrup Take 5 mLs by mouth 3 (three) times daily as needed for cough.  . [DISCONTINUED] HYDROcodone-acetaminophen (NORCO/VICODIN) 5-325 MG per tablet Take 1 tablet by mouth every 4 (four) hours as needed for moderate pain. (Patient not taking: Reported on 08/16/2014)  . [DISCONTINUED] oxyCODONE-acetaminophen (PERCOCET/ROXICET) 5-325 MG per tablet   . [DISCONTINUED] potassium chloride SA (K-DUR,KLOR-CON) 20 MEQ tablet Take 20 mEq by mouth daily.  . [DISCONTINUED]  rosuvastatin (CRESTOR) 5 MG tablet Take 5 mg by mouth at bedtime.   :   Review of Systems:  Out of a complete 14 point review of systems, all are reviewed and negative with the exception of these symptoms as listed below:   Review of Systems  Genitourinary:       Urination problems  Musculoskeletal:       Joint pain, aching muscles    Objective:  Neurologic Exam  Physical Exam Physical Examination:   Filed Vitals:   08/16/14 1316  BP: 122/84  Pulse: 81  Temp: 97.1 F (36.2 C)    General Examination: The patient is a very pleasant 69 y.o. female in no acute distress.  HEENT: Normocephalic, atraumatic, pupils are equal, round and reactive to light and accommodation. Funduscopic exam is normal with sharp disc margins noted. Extraocular tracking shows no significant saccadic breakdown without nystagmus noted. There is no limitation to her gaze. There is no decrease in eye blink rate. Hearing is intact. Face is symmetric with no facial masking and normal facial sensation. There is no lip, neck or jaw tremor. Neck is not rigid with intact passive ROM. There are no carotid bruits on auscultation. Oropharynx exam reveals mild mouth dryness. No significant airway crowding is noted. Mallampati is class II. Tongue  protrudes centrally and palate elevates symmetrically.   There is no drooling.   Chest: is clear to auscultation without wheezing, rhonchi or crackles noted.  Heart: sounds are regular and normal without murmurs, rubs or gallops noted.   Abdomen: is soft, non-tender and non-distended with normal bowel sounds appreciated on auscultation.  Extremities: There is no pitting edema in the distal lower extremities bilaterally. Pedal pulses are intact.   Skin: is warm and dry with no trophic changes noted.    Musculoskeletal: exam reveals no obvious joint deformities, tenderness, joint swelling or erythema.  Neurologically:  Mental status: The patient is awake and alert, paying good  attention. She is able to completely provide the history. Her husband provides some additional details. She is oriented to: person, place, time/date, situation, day of week, month of year and year. Her memory, attention, language and knowledge are intact. There is no aphasia, agnosia, apraxia or anomia. There is a no bradyphrenia. Speech is not hypophonic with no dysarthria noted. Mood is congruent and affect is normal.   Cranial nerves are as described above under HEENT exam. In addition, shoulder shrug is normal with equal shoulder height noted.  Motor exam: Normal bulk, and strength for age is noted. There are no dyskinesias noted.  Tone is not rigid with absence of cogwheeling.  there is no significant bradykinesia. There is no drift or rebound. There is no resting tremor. She has a mild to moderate intermittent postural tremor in the right more than left upper extremity. This tremor is fairly coarse and with larger amplitude. It has a variable frequency. She has minimal action tremor. She has no lower extremity tremor. Romberg is negative. Reflexes are 1+ in the upper and lower extremities.  Fine motor skills exam: fine motor skills with finger taps, hand movements, rapid alternating patting are not significantly  impaired bilaterally. Foot taps and foot agility are symmetrical.   Cerebellar testing shows no dysmetria or intention tremor on finger to nose testing. Heel to shin is unremarkable bilaterally. There is no truncal or gait ataxia.   Sensory exam is intact to light touch, pinprick, vibration, temperature sense and proprioception in the upper and lower extremities.  Gait, station and balance: She stands up from the seated position with no significant difficulty and does not need to push up with Her hands. She needs no assistance. No veering to one side is noted. She is not noted to lean to the side. Posture is age-appropriate. Stance is narrow-based. She walks with  fairly good stride length and pace and preserved arm swing bilaterally. She turns in 2 steps. Balance is not impaired.  Assessment and Plan:    In summary, [name] is a very pleasant 69 y.o.-year old female with an underlying medical history of reflux disease, chronic low back pain, diverticulosis, irritable bowel syndrome, arthritis, status post multiple surgeries including cholecystectomy, left nephrectomy, cervical disc surgery, appendectomy, hysterectomy, trigger finger surgery as well as carpal tunnel release in February 2015, who reports a one year history of intermittent R hand tremor, primarily with posture and action, not at rest. On examination, she has evidence of a bilateral upper extremity postural tremor, right more than left but not much in the way of parkinsonian signs. Overall, findings are mild. Her history and physical exam do not suggest Parkinson's disease at this time. I explained to the patient and her husband that sometimes when symptoms are mild the actual underlying diagnosis is not clear. I did talk to her about tremor triggers in general. She has noted that taking Xanax as needed helps tone down the tremor. She can probably continue to use this as needed but I advised her against using it every day for fear of  side effects such as sedation, balance problems, habit formation and tolerance build up. I think it is feasible that she be followed clinically. She is encouraged to keep her appointment with her neurologist for a recheck on her symptoms. She is advised that her physical exam is reassuring at this time. I do not see any other focal findings. I will see her back as needed. I answered all their questions today and the patient and her husband were in agreement.  Thank you very much for allowing me to participate in the care of this nice patient. If I can be of any further assistance to you please do not hesitate to call me at [phone].  Sincerely,   Star Age, MD, PhD

## 2014-10-21 DIAGNOSIS — Z780 Asymptomatic menopausal state: Secondary | ICD-10-CM | POA: Diagnosis not present

## 2014-12-07 ENCOUNTER — Encounter (HOSPITAL_BASED_OUTPATIENT_CLINIC_OR_DEPARTMENT_OTHER): Payer: Self-pay | Admitting: *Deleted

## 2014-12-08 ENCOUNTER — Other Ambulatory Visit: Payer: Self-pay | Admitting: Orthopedic Surgery

## 2014-12-08 NOTE — H&P (Signed)
[name] is an 69 y.o. female.   CC / Reason for Visit: Left wrist pain HPI: This patient is a 69 year old LHD female who presents for evaluation of left radial wrist pain.  She has an established history of de Quervain's tenosynovitis that has been treated by Dr. Rip Harbour with 2 different steroid injections, one on 05-12-14 and the second on 08-30-14.  Patient reports that she has good resolution of symptoms after the injection is only temporizing.  She has used Voltaren gel and bracing as well as stretching.  Dr. Rip Harbour is noted that she has TMC osteoarthritis as well, but this seems to be minimally symptomatic  Past Medical History  Diagnosis Date  . IBS (irritable bowel syndrome)     diarrhea predominant  . GERD (gastroesophageal reflux disease)     negative H pylori stool Ag  . HTN (hypertension)   . Chronic back pain   . Tubular adenoma of colon   . Diverticulosis   . Umbilical hernia   . Hyperplastic colon polyp 11/08/08  . Arthritis   . Wears glasses   . Bronchiolitis     acute  . High cholesterol   . Depression   . Anxiety     Past Surgical History  Procedure Laterality Date  . Cholecystectomy  1980  . Nephrectomy  1975    left-infection  . Cervical disc surgery  2007  . Ectopic pregnancy surgery      bilat  . Appendectomy    . Abdominal hysterectomy  1985  . Colonoscopy  11/08/08    Dr. Gala Romney- hemorrhoids, pancolonic diverticula,,hyperplastic polyp  . Colonoscopy   08/05/2005    IWL:NLGXQJ rectum/Diminutive polyp at 42 cm/Pancolonic diverticula/benign-appearing ulcer in the mid descending colon  . Colonoscopy N/A 03/30/2013    JHE:RDEYCXK anal canal hemorrhoids. Single colonic polyp-removed as found above. Colonic diverticulosis.  . Trigger finger release Right 08/23/2013    Procedure: RIGHT INDEX FINGER TRIGGER RELEASE ;  Surgeon: Jolyn Nap, MD;  Location: [place];  Service: Orthopedics;  Laterality: Right;  . Carpal tunnel release Right  08/23/2013    Procedure: RIGHT ENDOSCOPIC CARPAL  TUNNEL RELEASE;  Surgeon: Jolyn Nap, MD;  Location: [facility];  Service: Orthopedics;  Laterality: Right;  . Breast surgery  1989    both sides    Family History  Problem Relation Age of Onset  . Cancer Brother 60    gastric  . Heart attack Mother    Social History:  reports that she quit smoking about 14 years ago. She has never used smokeless tobacco. She reports that she does not drink alcohol or use illicit drugs.  Allergies:  Allergies  Allergen Reactions  . Morphine And Related Hypertension    No prescriptions prior to admission    No results found for this or any previous visit (from the past 48 hour(s)). No results found.  Review of Systems  All other systems reviewed and are negative.   Height 5' 6.5" (1.689 m), weight 87.091 kg (192 lb). Physical Exam  Constitutional:  WD, WN, NAD HEENT:  NCAT, EOMI Neuro/Psych:  Alert & oriented to person, place, and time; appropriate mood & affect Lymphatic: No generalized UE edema or lymphadenopathy Extremities / MSK:  Both UE are normal with respect to appearance, ranges of motion, joint stability, muscle strength/tone, sensation, & perfusion except as otherwise noted:  She has very mild pain with TMC stress and grind testing, more with Finkelstein's and EPB stress  as well as with palpation over the first dorsal compartment tendons.  Labs / Xrays:  No radiographic studies obtained today.  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  I indicated to her the scope of the operation, the anticipated recovery, et Ronney Asters.  We also discussed skin mobility exercises and she does have a removable wrist splint already that she can go back into after surgery.  She will sit with Juliann Pulse today and arrived at a time for surgery.   The details of the operative procedure were discussed with the patient.  Questions were invited and answered.  In addition to the goal of the procedure, the risks of the procedure to include but not limited to bleeding; infection; damage to the nerves or blood vessels that could result in bleeding, numbness, weakness, chronic pain, and the need for additional procedures; stiffness; the need for revision surgery; and anesthetic risks, were reviewed.  No specific outcome was guaranteed or implied.  Informed consent was obtained.  Zylon Creamer A. 12/08/2014, 1:04 PM

## 2014-12-09 ENCOUNTER — Encounter (HOSPITAL_BASED_OUTPATIENT_CLINIC_OR_DEPARTMENT_OTHER)
Admission: RE | Admit: 2014-12-09 | Discharge: 2014-12-09 | Disposition: A | Discharge status: Home / Self Care | Payer: Medicare PPO | Source: Ambulatory Visit | Attending: Orthopedic Surgery | Admitting: Orthopedic Surgery

## 2014-12-09 ENCOUNTER — Other Ambulatory Visit: Payer: Self-pay

## 2014-12-09 DIAGNOSIS — E780 Pure hypercholesterolemia: Secondary | ICD-10-CM | POA: Diagnosis not present

## 2014-12-09 DIAGNOSIS — I10 Essential (primary) hypertension: Secondary | ICD-10-CM | POA: Diagnosis not present

## 2014-12-09 DIAGNOSIS — Z886 Allergy status to analgesic agent status: Secondary | ICD-10-CM | POA: Diagnosis not present

## 2014-12-09 DIAGNOSIS — Z86010 Personal history of colonic polyps: Secondary | ICD-10-CM | POA: Diagnosis not present

## 2014-12-09 DIAGNOSIS — Z905 Acquired absence of kidney: Secondary | ICD-10-CM | POA: Diagnosis not present

## 2014-12-09 DIAGNOSIS — F419 Anxiety disorder, unspecified: Secondary | ICD-10-CM | POA: Diagnosis not present

## 2014-12-09 DIAGNOSIS — Z9049 Acquired absence of other specified parts of digestive tract: Secondary | ICD-10-CM | POA: Diagnosis not present

## 2014-12-09 DIAGNOSIS — F329 Major depressive disorder, single episode, unspecified: Secondary | ICD-10-CM | POA: Diagnosis not present

## 2014-12-09 DIAGNOSIS — K5730 Diverticulosis of large intestine without perforation or abscess without bleeding: Secondary | ICD-10-CM | POA: Diagnosis not present

## 2014-12-09 DIAGNOSIS — M654 Radial styloid tenosynovitis [de Quervain]: Secondary | ICD-10-CM | POA: Diagnosis present

## 2014-12-09 DIAGNOSIS — M1990 Unspecified osteoarthritis, unspecified site: Secondary | ICD-10-CM | POA: Diagnosis not present

## 2014-12-09 DIAGNOSIS — Z87891 Personal history of nicotine dependence: Secondary | ICD-10-CM | POA: Diagnosis not present

## 2014-12-09 DIAGNOSIS — Z90710 Acquired absence of both cervix and uterus: Secondary | ICD-10-CM | POA: Diagnosis not present

## 2014-12-09 DIAGNOSIS — K219 Gastro-esophageal reflux disease without esophagitis: Secondary | ICD-10-CM | POA: Diagnosis not present

## 2014-12-09 LAB — BASIC METABOLIC PANEL
Anion gap: 7 (ref 5–15)
BUN: 13 mg/dL (ref 6–20)
CO2: 26 mmol/L (ref 22–32)
Calcium: 9.3 mg/dL (ref 8.9–10.3)
Chloride: 104 mmol/L (ref 101–111)
Creatinine, Ser: 0.96 mg/dL (ref 0.44–1.00)
GFR calc Af Amer: >60 mL/min (ref >60)
GFR calc non Af Amer: 59 mL/min — ABNORMAL LOW (ref >60)
Glucose, Bld: 110 mg/dL — ABNORMAL HIGH (ref 65–99)
Potassium: 4.2 mmol/L (ref 3.5–5.1)
Sodium: 137 mmol/L (ref 135–145)

## 2014-12-11 NOTE — Anesthesia Preprocedure Evaluation (Signed)
Anesthesia Evaluation  Patient identified by MRN, date of birth, ID band Patient awake    Reviewed: Allergy & Precautions, H&P , NPO status , Patient's Chart, lab work & pertinent test results  Airway Mallampati: II  TM Distance: >3 FB Neck ROM: Full    Dental no notable dental hx. (+) Teeth Intact, Dental Advisory Given   Pulmonary neg pulmonary ROS, former smoker,  breath sounds clear to auscultation  Pulmonary exam normal       Cardiovascular hypertension, Pt. on medications Rhythm:Regular Rate:Normal     Neuro/Psych PSYCHIATRIC DISORDERS Anxiety Depression negative neurological ROS     GI/Hepatic Neg liver ROS, GERD-  Medicated and Controlled,  Endo/Other  negative endocrine ROS  Renal/GU negative Renal ROS     Musculoskeletal  (+) Arthritis -,   Abdominal   Peds  Hematology negative hematology ROS (+)   Anesthesia Other Findings   Reproductive/Obstetrics negative OB ROS                             Anesthesia Physical  Anesthesia Plan  ASA: II  Anesthesia Plan: MAC   Post-op Pain Management:    Induction: Intravenous  Airway Management Planned: Simple Face Mask  Additional Equipment:   Intra-op Plan:   Post-operative Plan:   Informed Consent: I have reviewed the patients History and Physical, chart, labs and discussed the procedure including the risks, benefits and alternatives for the proposed anesthesia with the patient or authorized representative who has indicated his/her understanding and acceptance.   Dental advisory given  Plan Discussed with: CRNA  Anesthesia Plan Comments:         Anesthesia Quick Evaluation

## 2014-12-12 ENCOUNTER — Ambulatory Visit (HOSPITAL_BASED_OUTPATIENT_CLINIC_OR_DEPARTMENT_OTHER)
Admission: RE | Admit: 2014-12-12 | Discharge: 2014-12-12 | Disposition: A | Discharge status: Home / Self Care | Payer: Medicare PPO | Source: Ambulatory Visit | Attending: Orthopedic Surgery | Admitting: Orthopedic Surgery

## 2014-12-12 ENCOUNTER — Encounter (HOSPITAL_BASED_OUTPATIENT_CLINIC_OR_DEPARTMENT_OTHER)
Admission: RE | Disposition: A | Discharge status: Home / Self Care | Payer: Self-pay | Source: Ambulatory Visit | Attending: Orthopedic Surgery

## 2014-12-12 ENCOUNTER — Ambulatory Visit (HOSPITAL_BASED_OUTPATIENT_CLINIC_OR_DEPARTMENT_OTHER): Payer: Medicare PPO | Admitting: Anesthesiology

## 2014-12-12 ENCOUNTER — Encounter (HOSPITAL_BASED_OUTPATIENT_CLINIC_OR_DEPARTMENT_OTHER): Payer: Self-pay | Admitting: *Deleted

## 2014-12-12 DIAGNOSIS — M654 Radial styloid tenosynovitis [de Quervain]: Secondary | ICD-10-CM | POA: Diagnosis not present

## 2014-12-12 DIAGNOSIS — M1990 Unspecified osteoarthritis, unspecified site: Secondary | ICD-10-CM | POA: Insufficient documentation

## 2014-12-12 DIAGNOSIS — Z86010 Personal history of colonic polyps: Secondary | ICD-10-CM | POA: Insufficient documentation

## 2014-12-12 DIAGNOSIS — K5730 Diverticulosis of large intestine without perforation or abscess without bleeding: Secondary | ICD-10-CM | POA: Diagnosis not present

## 2014-12-12 DIAGNOSIS — I10 Essential (primary) hypertension: Secondary | ICD-10-CM | POA: Insufficient documentation

## 2014-12-12 DIAGNOSIS — E780 Pure hypercholesterolemia: Secondary | ICD-10-CM | POA: Diagnosis not present

## 2014-12-12 DIAGNOSIS — F329 Major depressive disorder, single episode, unspecified: Secondary | ICD-10-CM | POA: Insufficient documentation

## 2014-12-12 DIAGNOSIS — Z905 Acquired absence of kidney: Secondary | ICD-10-CM | POA: Insufficient documentation

## 2014-12-12 DIAGNOSIS — K219 Gastro-esophageal reflux disease without esophagitis: Secondary | ICD-10-CM | POA: Diagnosis not present

## 2014-12-12 DIAGNOSIS — F419 Anxiety disorder, unspecified: Secondary | ICD-10-CM | POA: Diagnosis not present

## 2014-12-12 DIAGNOSIS — Z9049 Acquired absence of other specified parts of digestive tract: Secondary | ICD-10-CM | POA: Diagnosis not present

## 2014-12-12 DIAGNOSIS — Z87891 Personal history of nicotine dependence: Secondary | ICD-10-CM | POA: Insufficient documentation

## 2014-12-12 DIAGNOSIS — Z90710 Acquired absence of both cervix and uterus: Secondary | ICD-10-CM | POA: Insufficient documentation

## 2014-12-12 DIAGNOSIS — Z886 Allergy status to analgesic agent status: Secondary | ICD-10-CM | POA: Insufficient documentation

## 2014-12-12 HISTORY — PX: DORSAL COMPARTMENT RELEASE: SHX5039

## 2014-12-12 SURGERY — RELEASE, FIRST DORSAL COMPARTMENT, HAND
Anesthesia: Monitor Anesthesia Care | Site: Wrist | Laterality: Left

## 2014-12-12 MED ORDER — FENTANYL CITRATE (PF) 100 MCG/2ML IJ SOLN
INTRAMUSCULAR | Status: DC | PRN
  Administered 2014-12-12: 50 ug via INTRAVENOUS

## 2014-12-12 MED ORDER — KETOROLAC TROMETHAMINE 30 MG/ML IJ SOLN
30.0000 mg | Freq: Once | INTRAMUSCULAR | Status: DC | PRN
Start: 2014-12-12 — End: 2014-12-12

## 2014-12-12 MED ORDER — HYDROMORPHONE HCL 1 MG/ML IJ SOLN: 0.2500 mg | INTRAMUSCULAR | Status: DC | PRN

## 2014-12-12 MED ORDER — LIDOCAINE-EPINEPHRINE (PF) 1 %-1:200000 IJ SOLN
INTRAMUSCULAR | Status: AC
  Filled 2014-12-12: qty 10

## 2014-12-12 MED ORDER — FENTANYL CITRATE (PF) 100 MCG/2ML IJ SOLN
INTRAMUSCULAR | Status: AC
  Filled 2014-12-12: qty 2

## 2014-12-12 MED ORDER — PROMETHAZINE HCL 25 MG/ML IJ SOLN: 6.2500 mg | INTRAMUSCULAR | Status: DC | PRN

## 2014-12-12 MED ORDER — PROPOFOL 500 MG/50ML IV EMUL
INTRAVENOUS | Status: AC
  Filled 2014-12-12: qty 50

## 2014-12-12 MED ORDER — OXYCODONE HCL 5 MG PO TABS: 5.0000 mg | ORAL_TABLET | Freq: Once | ORAL | Status: DC | PRN

## 2014-12-12 MED ORDER — HYDROCODONE-ACETAMINOPHEN 5-325 MG PO TABS: 1.0000 | ORAL_TABLET | ORAL | Status: DC | PRN

## 2014-12-12 MED ORDER — SUCCINYLCHOLINE CHLORIDE 20 MG/ML IJ SOLN
INTRAMUSCULAR | Status: AC
  Filled 2014-12-12: qty 1

## 2014-12-12 MED ORDER — ONDANSETRON HCL 4 MG/2ML IJ SOLN
INTRAMUSCULAR | Status: DC | PRN
  Administered 2014-12-12: 4 mg via INTRAVENOUS

## 2014-12-12 MED ORDER — FENTANYL CITRATE (PF) 100 MCG/2ML IJ SOLN: 50.0000 ug | INTRAMUSCULAR | Status: DC | PRN

## 2014-12-12 MED ORDER — LACTATED RINGERS IV SOLN
INTRAVENOUS | Status: DC
Start: 2014-12-12 — End: 2014-12-12
  Administered 2014-12-12: 07:00:00 via INTRAVENOUS

## 2014-12-12 MED ORDER — OXYCODONE HCL 5 MG/5ML PO SOLN: 5.0000 mg | Freq: Once | ORAL | Status: DC | PRN

## 2014-12-12 MED ORDER — BUPIVACAINE HCL (PF) 0.5 % IJ SOLN
INTRAMUSCULAR | Status: AC
  Filled 2014-12-12: qty 30

## 2014-12-12 MED ORDER — LIDOCAINE-EPINEPHRINE (PF) 1 %-1:200000 IJ SOLN
INTRAMUSCULAR | Status: DC | PRN
  Administered 2014-12-12: 10 mL

## 2014-12-12 MED ORDER — MEPERIDINE HCL 25 MG/ML IJ SOLN: 6.2500 mg | INTRAMUSCULAR | Status: DC | PRN

## 2014-12-12 MED ORDER — PROPOFOL INFUSION 10 MG/ML OPTIME
INTRAVENOUS | Status: DC | PRN
  Administered 2014-12-12: 75 ug/kg/min via INTRAVENOUS

## 2014-12-12 MED ORDER — GLYCOPYRROLATE 0.2 MG/ML IJ SOLN: 0.2000 mg | Freq: Once | INTRAMUSCULAR | Status: DC | PRN

## 2014-12-12 MED ORDER — CEFAZOLIN SODIUM-DEXTROSE 2-3 GM-% IV SOLR
INTRAVENOUS | Status: AC
  Filled 2014-12-12: qty 50

## 2014-12-12 MED ORDER — CEFAZOLIN SODIUM-DEXTROSE 2-3 GM-% IV SOLR
2.0000 g | INTRAVENOUS | Status: AC
  Administered 2014-12-12: 2 g via INTRAVENOUS

## 2014-12-12 MED ORDER — MIDAZOLAM HCL 2 MG/2ML IJ SOLN: 1.0000 mg | INTRAMUSCULAR | Status: DC | PRN

## 2014-12-12 MED ORDER — LACTATED RINGERS IV SOLN: INTRAVENOUS | Status: DC

## 2014-12-12 SURGICAL SUPPLY — 43 items
BLADE MINI RND TIP GREEN BEAV (BLADE) IMPLANT
BLADE SURG 15 STRL LF DISP TIS (BLADE) ×1 IMPLANT
BLADE SURG 15 STRL SS (BLADE) ×3
BNDG CMPR 9X4 STRL LF SNTH (GAUZE/BANDAGES/DRESSINGS)
BNDG COHESIVE 4X5 TAN STRL (GAUZE/BANDAGES/DRESSINGS) ×3 IMPLANT
BNDG ESMARK 4X9 LF (GAUZE/BANDAGES/DRESSINGS) IMPLANT
BNDG GAUZE ELAST 4 BULKY (GAUZE/BANDAGES/DRESSINGS) ×6 IMPLANT
CHLORAPREP W/TINT 26ML (MISCELLANEOUS) ×3 IMPLANT
COVER BACK TABLE 60X90IN (DRAPES) ×3 IMPLANT
COVER MAYO STAND STRL (DRAPES) ×3 IMPLANT
CUFF TOURNIQUET SINGLE 18IN (TOURNIQUET CUFF) ×2 IMPLANT
DRAPE EXTREMITY T 121X128X90 (DRAPE) ×3 IMPLANT
DRAPE SURG 17X23 STRL (DRAPES) ×3 IMPLANT
DRSG EMULSION OIL 3X3 NADH (GAUZE/BANDAGES/DRESSINGS) ×3 IMPLANT
GLOVE BIO SURGEON STRL SZ7.5 (GLOVE) ×3 IMPLANT
GLOVE BIOGEL PI IND STRL 7.0 (GLOVE) ×1 IMPLANT
GLOVE BIOGEL PI IND STRL 8 (GLOVE) ×1 IMPLANT
GLOVE BIOGEL PI INDICATOR 7.0 (GLOVE) ×4
GLOVE BIOGEL PI INDICATOR 8 (GLOVE) ×2
GLOVE ECLIPSE 6.5 STRL STRAW (GLOVE) ×5 IMPLANT
GOWN STRL REUS W/ TWL LRG LVL3 (GOWN DISPOSABLE) ×2 IMPLANT
GOWN STRL REUS W/TWL LRG LVL3 (GOWN DISPOSABLE) ×6
GOWN STRL REUS W/TWL XL LVL3 (GOWN DISPOSABLE) ×3 IMPLANT
NDL HYPO 25X1 1.5 SAFETY (NEEDLE) IMPLANT
NDL SAFETY ECLIPSE 18X1.5 (NEEDLE) IMPLANT
NEEDLE HYPO 18GX1.5 SHARP (NEEDLE) ×3
NEEDLE HYPO 25X1 1.5 SAFETY (NEEDLE) ×3 IMPLANT
NS IRRIG 1000ML POUR BTL (IV SOLUTION) ×3 IMPLANT
PACK BASIN DAY SURGERY FS (CUSTOM PROCEDURE TRAY) ×3 IMPLANT
PADDING CAST ABS 4INX4YD NS (CAST SUPPLIES) ×2
PADDING CAST ABS COTTON 4X4 ST (CAST SUPPLIES) ×1 IMPLANT
SPLINT PLASTER CAST XFAST 3X15 (CAST SUPPLIES) ×8 IMPLANT
SPLINT PLASTER XTRA FASTSET 3X (CAST SUPPLIES)
SPONGE GAUZE 4X4 12PLY STER LF (GAUZE/BANDAGES/DRESSINGS) ×3 IMPLANT
STOCKINETTE 6  STRL (DRAPES) ×2
STOCKINETTE 6 STRL (DRAPES) ×1 IMPLANT
SUT VICRYL RAPIDE 4-0 (SUTURE) IMPLANT
SUT VICRYL RAPIDE 4/0 PS 2 (SUTURE) IMPLANT
SYR BULB 3OZ (MISCELLANEOUS) ×2 IMPLANT
SYRINGE 10CC LL (SYRINGE) ×2 IMPLANT
TOWEL OR 17X24 6PK STRL BLUE (TOWEL DISPOSABLE) ×3 IMPLANT
TOWEL OR NON WOVEN STRL DISP B (DISPOSABLE) ×3 IMPLANT
UNDERPAD 30X30 (UNDERPADS AND DIAPERS) ×3 IMPLANT

## 2014-12-12 NOTE — Discharge Instructions (Addendum)
Discharge Instructions   You have a dressing without a plaster splint incorporated in it. Move your fingers as much as possible, making a full fist and fully opening the fist. No lifting, gripping, or grasping with the left hand more than 2 pounds. Elevate your hand to reduce pain & swelling of the digits.  Ice over the operative site may be helpful to reduce pain & swelling.  DO NOT USE HEAT. Pain medicine has been prescribed for you.  Use your medicine as needed over the first 48 hours, and then you can begin to taper your use.  You may use Tylenol in place of your prescribed pain medication, but not IN ADDITION to it. Leave the dressing in place until Thursday, then remove it and replace it with your removeable splint.  You may then remove the splint for sleeping, showering, and performing your tendon stretching and skin mobility exercises. You may shower, but keep the bandage clean & dry while it is still on. You may drive a car when you are off of prescription pain medications and can safely control your vehicle with both hands.   Please call [phone] during normal business hours or [phone] after hours for any problems. Including the following:  - excessive redness of the incisions - drainage for more than 4 days - fever of more than 101.5 F  *Please note that pain medications will not be refilled after hours or on weekends.   Post Anesthesia Home Care Instructions  Activity: Get plenty of rest for the remainder of the day. A responsible adult should stay with you for 24 hours following the procedure.  For the next 24 hours, DO NOT: -Drive a car -Paediatric nurse -Drink alcoholic beverages -Take any medication unless instructed by your physician -Make any legal decisions or sign important papers.  Meals: Start with liquid foods such as gelatin or soup. Progress to regular foods as tolerated. Avoid greasy, spicy, heavy foods. If nausea and/or vomiting occur, drink  only clear liquids until the nausea and/or vomiting subsides. Call your physician if vomiting continues.  Special Instructions/Symptoms: Your throat may feel dry or sore from the anesthesia or the breathing tube placed in your throat during surgery. If this causes discomfort, gargle with warm salt water. The discomfort should disappear within 24 hours.  If you had a scopolamine patch placed behind your ear for the management of post- operative nausea and/or vomiting:  1. The medication in the patch is effective for 72 hours, after which it should be removed.  Wrap patch in a tissue and discard in the trash. Wash hands thoroughly with soap and water. 2. You may remove the patch earlier than 72 hours if you experience unpleasant side effects which may include dry mouth, dizziness or visual disturbances. 3. Avoid touching the patch. Wash your hands with soap and water after contact with the patch.         HAND SURGERY    HOME CARE INSTRUCTIONS    The following instructions have been prepared to help you care for yourself upon your return home today.  Wound Care:  Keep your hand elevated above the level of your heart. Do not allow it to dangle by your side. Keep the dressing dry and do not remove it unless your doctor advises you to do so. He will usually change it at the time of you post-op visit. Moving your fingers is advised to stimulate circulation but will depend on the site of your surgery. Of course, if  you have a splint applied your doctor will advise you about movement.  Activity:  Do not drive or operate machinery today. Rest today and then you may return to your normal activity and work as indicated by your physician.  Diet: Drink liquids today or eat a light diet. You may resume a regular diet tomorrow.  General expectations: Pain for two or three days. Fingers may become slightly swollen.   Unexpected Observations- Call your doctor if any of these occur: Severe pain not  relieved by pain medication. Elevated temperature. Dressing soaked with blood. Inability to move fingers. White or bluish color to fingers.

## 2014-12-12 NOTE — Anesthesia Procedure Notes (Signed)
Procedure Name: MAC Date/Time: 12/12/2014 7:39 AM Performed by: Viviann Broyles D Pre-anesthesia Checklist: Patient identified, Emergency Drugs available, Suction available, Patient being monitored and Timeout performed Patient Re-evaluated:Patient Re-evaluated prior to inductionOxygen Delivery Method: Simple face mask

## 2014-12-12 NOTE — Transfer of Care (Signed)
Immediate Anesthesia Transfer of Care Note  Patient: [name]  Procedure(s) Performed: Procedure(s): LEFT WRIST RELEASE DORSAL COMPARTMENT (DEQUERVAIN) (Left)  Patient Location: PACU  Anesthesia Type:MAC  Level of Consciousness: awake, alert , oriented and patient cooperative  Airway & Oxygen Therapy: Patient Spontanous Breathing and Patient connected to face mask oxygen  Post-op Assessment: Report given to RN and Post -op Vital signs reviewed and stable  Post vital signs: Reviewed and stable  Last Vitals:  Filed Vitals:   12/12/14 0635  BP: 124/77  Pulse: 66  Temp: 36.6 C  Resp: 20    Complications: No apparent anesthesia complications

## 2014-12-12 NOTE — Anesthesia Postprocedure Evaluation (Signed)
Anesthesia Post Note  Patient: [name]  Procedure(s) Performed: Procedure(s) (LRB): LEFT WRIST RELEASE DORSAL COMPARTMENT (DEQUERVAIN) (Left)  Anesthesia type: MAC  Patient location: PACU  Post pain: Pain level controlled  Post assessment: Post-op Vital signs reviewed  Last Vitals: BP 132/78 mmHg  Pulse 61  Temp(Src) 36.4 C (Oral)  Resp 18  Ht 5' 6.5" (1.689 m)  Wt 185 lb 6 oz (84.086 kg)  BMI 29.48 kg/m2  SpO2 100%  Post vital signs: Reviewed  Level of consciousness: awake  Complications: No apparent anesthesia complications

## 2014-12-12 NOTE — Op Note (Signed)
12/12/2014  7:35 AM  PATIENT:  [name]  69 y.o. female  PRE-OPERATIVE DIAGNOSIS:  Left wrist de Quervain's tenosynovitis  POST-OPERATIVE DIAGNOSIS:  Same  PROCEDURE:  Open treatment of left wrist de Quervain's via 1st dorsal compartment plasty and compartment tenosynovectomy  SURGEON: Rayvon Char. Grandville Silos, MD  PHYSICIAN ASSISTANT: Morley Kos, OPA-C  ANESTHESIA:  local and MAC  SPECIMENS:  None  DRAINS:   None  EBL:  less than 50 mL  PREOPERATIVE INDICATIONS:  [name] is a  69 y.o. female with persisting left wrist deQuervain's tenosynovitis  The risks benefits and alternatives were discussed with the patient preoperatively including but not limited to the risks of infection, bleeding, nerve injury, cardiopulmonary complications, the need for revision surgery, among others, and the patient verbalized understanding and consented to proceed.  OPERATIVE IMPLANTS: none  OPERATIVE PROCEDURE:  After receiving prophylactic antibiotics, the patient was escorted to the operative theatre and placed in a supine position.  A surgical "time-out" was performed during which the planned procedure, proposed operative site, and the correct patient identity were compared to the operative consent and agreement confirmed by the circulating nurse according to current facility policy. The incision was marked and anesthetized with a mixture of lidocaine and Marcaine bearing epinephrine. Following application of a tourniquet to the operative extremity, the exposed skin was prepped with Chloraprep and draped in the usual sterile fashion.  The limb was exsanguinated with an Esmarch bandage and the tourniquet inflated to approximately 155mmHg higher than systolic BP.  An oblique incision was made over the first dorsal compartment near the end of the radius. The skin was incised sharply with a scalpel, subcutaneous tissues lifted in full-thickness fashion, including the crossing cutaneous nerves  and vein which were lifted dorsally. The compartment was identified and about a centimeter proximal to the tip of the radial styloid it was incised transversely carefully to avoid damage to the tendons. This allowed for the dorsal and volar aspects of the compartment to be identified to ensure that there was not a separate compartment for the EPB tendon. Having defined the extent of the compartment in that manner, a Z-plasty was then performed by creating a distal palmarly based flap and a more proximal dorsally based flap by extending the transverse incision longitudinally in different directions and different ends of the incision. The compartment flaps were then retracted and the tendons lifted up. There was some thickening and proliferative tenosynovium about the tendons as well as within the floor the compartment and this was excised with scissor dissection. There was not a separate compartment for the EPB tendon. The wound was irrigated, the tendons returned to her bed, and the Z-plasty reapproximated and expanded fashion with capacious space for the tendons. Tourniquet was released the wound was again irrigated and the skin was closed with 40 Vicryls Rapide interrupted deep dermal subcuticular suture and running subcuticular suture with benzoin and Steri-Strips. A bulky dressing was applied and she was taken to the recovery room  Disposition: She will be discharged home today with typical instructions, returning 10-15 days. 3 days postop, she will remove her dressing, returned to her own splint, and begin tendon stretching and skin mobility exercises.

## 2014-12-12 NOTE — Interval H&P Note (Signed)
History and Physical Interval Note:  12/12/2014 7:34 AM  [name]  has presented today for surgery, with the diagnosis of left wrist dequarvain tenosynovitis  The various methods of treatment have been discussed with the patient and family. After consideration of risks, benefits and other options for treatment, the patient has consented to  Procedure(s): Clayton (DEQUERVAIN) (Left) as a surgical intervention .  The patient's history has been reviewed, patient examined, no change in status, stable for surgery.  I have reviewed the patient's chart and labs.  Questions were answered to the patient's satisfaction.     Pari Lombard A.

## 2014-12-13 ENCOUNTER — Encounter (HOSPITAL_BASED_OUTPATIENT_CLINIC_OR_DEPARTMENT_OTHER): Payer: Self-pay | Admitting: Orthopedic Surgery

## 2014-12-23 ENCOUNTER — Other Ambulatory Visit (HOSPITAL_COMMUNITY): Payer: Self-pay | Admitting: Family Medicine

## 2014-12-23 DIAGNOSIS — Z1231 Encounter for screening mammogram for malignant neoplasm of breast: Secondary | ICD-10-CM

## 2015-01-04 ENCOUNTER — Ambulatory Visit (INDEPENDENT_AMBULATORY_CARE_PROVIDER_SITE_OTHER): Payer: Medicare PPO | Admitting: Neurology

## 2015-01-04 ENCOUNTER — Encounter: Payer: Self-pay | Admitting: Neurology

## 2015-01-04 VITALS — BP 130/82 | HR 82 | Resp 16 | Ht 67.0 in | Wt 188.0 lb

## 2015-01-04 DIAGNOSIS — R251 Tremor, unspecified: Secondary | ICD-10-CM

## 2015-01-04 NOTE — Patient Instructions (Signed)
Your exam is stable, no signs of parkinson's. You can follow up with Dr. Merlene Laughter as scheduled.

## 2015-01-04 NOTE — Progress Notes (Signed)
Subjective:    Patient ID: [name] is a 69 y.o. female.  HPI     Interim history:   [name] is a very friendly 69 year old left-handed woman with an underlying medical history of reflux disease, chronic low back pain, diverticulosis, irritable bowel syndrome, arthritis, status post multiple surgeries including cholecystectomy, left nephrectomy, cervical disc surgery, appendectomy, hysterectomy, trigger finger surgery as well as carpal tunnel release in February 2015, who presents for reevaluation. She is unaccompanied by today. I first met her on 08/16/2014 at the request of her primary care physician for second opinion of parkinsonism. At that time she reported a one year history of intermittent right hand tremor, primarily with posture and action, not at rest. On my examination she did not have much in the way of parkinsonian signs. Overall her symptoms were mild. She had noted that as needed Xanax helped tone down her tremors. She had been seeing Dr. Merlene Laughter and I recommended that she continue to follow-up with him as planned.   Today, 01/04/2015: She reports that her tremor seems a little worse. She had surgery on her left wrist about a month ago. She has done well after that. She still on pain medications. She felt her tremor was worse when she was on tramadol but it was better when she was placed on hydrocodone. She has been on hydrocodone in the past for back pain. She takes hydrocodone about once every 6 hours at this time. She feels that her memory is a little worse. She endorses some stress.  Previously:   She reports a one year history of intermittent R hand tremor, primarily with posture and action, not at rest. She has had trouble holding a glass or pouring something. She has filled wine during communion at church. She has had some achiness in her right arm and shoulder area. She has not noticed much in the way of balance problems or walking difficulties. She fell on  05/10/2014 but caught her foot against a chair. She broke her left fourth toe. She has no significant memory issues. She has had vivid dreams. She used to have very vivid dreams in high school. She was working third shift at the time and had trouble sleeping during the day. She has had some sleep talking but no dream enactments and no sleepwalking. She has never fallen out of bed. She has no FHx of ET or PD. She has a remote history of headaches and used to go [facility] the [facility] years [facility] and HAs resolved. She  has seen Dr. Merlene Laughter in [place] for her tremors and was given a Rx for sinemet 25-100 mg on 11/15/13 and was supposed to titrate up from 1 per day to 1 tid, but could not tolerate it and in fact felt bad and depressed. She was on 1 pill once daily for about 1 1/2 weeks, but stopped it. She did not note any improvement in her symptoms. Since then she has not been tried on any other antiparkinsonian medication. She has an appointment with him for follow-up in the spring.   Feeling nervous or anxious makes her tremor worse.   Her Past Medical History Is Significant For: Past Medical History  Diagnosis Date  . IBS (irritable bowel syndrome)     diarrhea predominant  . GERD (gastroesophageal reflux disease)     negative H pylori stool Ag  . HTN (hypertension)   . Chronic back pain   . Tubular adenoma of colon   .  Diverticulosis   . Umbilical hernia   . Hyperplastic colon polyp 11/08/08  . Arthritis   . Wears glasses   . Bronchiolitis     acute  . High cholesterol   . Depression   . Anxiety     Her Past Surgical History Is Significant For: Past Surgical History  Procedure Laterality Date  . Cholecystectomy  1980  . Nephrectomy  1975    left-infection  . Cervical disc surgery  2007  . Ectopic pregnancy surgery      bilat  . Appendectomy    . Abdominal hysterectomy  1985  . Colonoscopy  11/08/08    Dr. Gala Romney- hemorrhoids, pancolonic diverticula,,hyperplastic polyp  .  Colonoscopy   08/05/2005    ZOX:WRUEAV rectum/Diminutive polyp at 45 cm/Pancolonic diverticula/benign-appearing ulcer in the mid descending colon  . Colonoscopy N/A 03/30/2013    WUJ:[id number] anal canal hemorrhoids. Single colonic polyp-removed as found above. Colonic diverticulosis.  . Trigger finger release Right 08/23/2013    Procedure: RIGHT INDEX FINGER TRIGGER RELEASE ;  Surgeon: Jolyn Nap, MD;  Location: [facility];  Service: Orthopedics;  Laterality: Right;  . Carpal tunnel release Right 08/23/2013    Procedure: RIGHT ENDOSCOPIC CARPAL  TUNNEL RELEASE;  Surgeon: Jolyn Nap, MD;  Location: [facility];  Service: Orthopedics;  Laterality: Right;  . Breast surgery  1989    both sides  . Dorsal compartment release Left 12/12/2014    Procedure: LEFT WRIST RELEASE DORSAL COMPARTMENT (DEQUERVAIN);  Surgeon: Milly Jakob, MD;  Location: [facility];  Service: Orthopedics;  Laterality: Left;    Her Family History Is Significant For: Family History  Problem Relation Age of Onset  . Cancer Brother 60    gastric  . Heart attack Mother     Her Social History Is Significant For: History   Social History  . Marital Status: Married    Spouse Name: [name]  . Number of Children: 1  . Years of Education: some coll.   Occupational History  . 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 Smoking status: Former Smoker    Quit date: 08/19/2000  . Smokeless tobacco: Never Used     Comment: Quit x 12 years  . Alcohol Use: No     Comment: maybe one a month wine  . Drug Use: No  . Sexual Activity: Yes    Birth Control/ Protection: Surgical   Other Topics Concern  . None   Social History Narrative   Consumes 2 cups of caffeine daily    Her Allergies Are:  Allergies  Allergen Reactions  . Morphine And Related Hypertension  :   Her Current Medications Are:  Outpatient Encounter Prescriptions as of 01/04/2015   Medication Sig  . ALPRAZolam (XANAX) 0.5 MG tablet Take 0.5 mg by mouth daily as needed. For anxiety or sleep  . amLODipine-valsartan (EXFORGE) 5-160 MG per tablet Take 1 tablet by mouth daily.  [name] aspirin EC 81 MG tablet Take 81 mg by mouth every morning.  . Ergocalciferol (VITAMIN D2 PO) Take by mouth.  . furosemide (LASIX) 20 MG tablet Take 20 mg by mouth at bedtime.   [name] HYDROcodone-acetaminophen (NORCO) 5-325 MG per tablet Take 1-2 tablets by mouth every 4 (four) hours as needed.  . Linaclotide (LINZESS) 145 MCG CAPS capsule Take 1 capsule (145 mcg total) by mouth daily before breakfast. (Patient taking differently: Take 145 mcg by mouth daily before breakfast.  As needed)  . meloxicam (MOBIC) 15 MG tablet Take 15 mg by mouth daily.   [name] omeprazole (PRILOSEC) 20 MG capsule Take 20 mg by mouth daily as needed. For acid reflux  . traMADol (ULTRAM) 50 MG tablet Take 50 mg by mouth every 8 (eight) hours as needed. For pain  . ZETIA 10 MG tablet Take 10 mg by mouth daily.    No facility-administered encounter medications on file as of 01/04/2015.  :  Review of Systems:  Out of a complete 14 point review of systems, all are reviewed and negative with the exception of these symptoms as listed below:  Review of Systems  Neurological: Positive for tremors.       Patient states that R arm tremors are worse when on Tramadol or when stressed. Reports tremors were less on hydrocodone (given after surgery). Pain in upper R arm/shoulder.   Psychiatric/Behavioral:       Increased emotions such as feeling "timid" or overly sensitive.     Objective:  Neurologic Exam  Physical Exam Physical Examination:   Filed Vitals:   01/04/15 1506  BP: 130/82  Pulse: 82  Resp: 16    General Examination: The patient is a very pleasant 69 y.o. female in no acute distress.  HEENT: Normocephalic, atraumatic, pupils are equal, round and reactive to light and accommodation. Funduscopic exam is normal with sharp  disc margins noted. Extraocular tracking shows no significant saccadic breakdown without nystagmus noted. There is no limitation to her gaze. There is no decrease in eye blink rate. Hearing is intact. Face is symmetric with no facial masking and normal facial sensation. There is no lip, neck or jaw tremor. Neck is not rigid with intact passive ROM. There are no carotid bruits on auscultation. Oropharynx exam reveals moderate mouth dryness. No significant airway crowding is noted. Mallampati is class II. Tongue protrudes centrally and palate elevates symmetrically. There is no drooling.   Chest: is clear to auscultation without wheezing, rhonchi or crackles noted.  Heart: sounds are regular and normal without murmurs, rubs or gallops noted.   Abdomen: is soft, non-tender and non-distended with normal bowel sounds appreciated on auscultation.  Extremities: There is no pitting edema in the distal lower extremities bilaterally. Pedal pulses are intact.   Skin: is warm and dry with no trophic changes noted.    Musculoskeletal: exam reveals no obvious joint deformities, tenderness, joint swelling or erythema. There is a well-healing scar at her left wrist.  Neurologically:  Mental status: The patient is awake and alert, paying good  attention. She is able to completely provide the history. She is oriented to: person, place, time/date, situation, day of week, month of year and year. Her memory, attention, language and knowledge are intact. There is no aphasia, agnosia, apraxia or anomia. There is a no bradyphrenia. Speech is not hypophonic with no dysarthria noted. Mood is congruent and affect is normal.   Cranial nerves are as described above under HEENT exam. In addition, shoulder shrug is normal with equal shoulder height noted.  Motor exam: Normal bulk, and strength for age is noted. There are no dyskinesias noted.  Tone is not rigid with absence of cogwheeling.  there is no significant  bradykinesia. There is no drift or rebound. There is no resting tremor. She has a slight postural tremor in the right more than left upper extremity. This tremor is fairly coarse and with larger amplitude. It has a variable frequency. She has minimal action tremor. She has no  lower extremity tremor. Romberg is negative. Reflexes are 1+ in the upper and lower extremities.  Fine motor skills exam: fine motor skills with finger taps, hand movements, rapid alternating patting are not significantly impaired bilaterally. Foot taps and foot agility are symmetrical.   Cerebellar testing shows no dysmetria or intention tremor on finger to nose testing. Heel to shin is unremarkable bilaterally. There is no truncal or gait ataxia.   Sensory exam is intact to light touch in the upper and lower extremities.   Gait, station and balance: She stands up from the seated position with no significant difficulty and does not need to push up with Her hands. She needs no assistance. No veering to one side is noted. She is not noted to lean to the side. Posture is age-appropriate. Stance is narrow-based. She walks with  fairly good stride length and pace and preserved arm swing bilaterally. She turns in 2 steps. Balance is not impaired.  Assessment and Plan:    In summary, [name] is a very pleasant 69 year old female with an underlying medical history of reflux disease, chronic low back pain, diverticulosis, irritable bowel syndrome, arthritis, status post multiple surgeries including cholecystectomy, left nephrectomy, cervical disc surgery, appendectomy, hysterectomy, trigger finger surgery as well as carpal tunnel release in February 2015, who presents for follow-up consultation of her right hand tremor. Her exam is stable and she is reassured in that regard. Her history and physical exam does not suggest any Parkinson's disease at this time and she is again reassured. She is encouraged to follow-up with her primary  neurologist as scheduled in August. I did encourage her to drink more water as her mouth looked dry. She is encouraged to reduce her narcotic pain medications if possible. I answered all their questions today and the patient was in agreement.

## 2015-01-10 DIAGNOSIS — I10 Essential (primary) hypertension: Secondary | ICD-10-CM | POA: Diagnosis not present

## 2015-01-10 DIAGNOSIS — M130 Polyarthritis, unspecified: Secondary | ICD-10-CM | POA: Diagnosis not present

## 2015-01-16 DIAGNOSIS — M25511 Pain in right shoulder: Secondary | ICD-10-CM | POA: Diagnosis not present

## 2015-01-16 DIAGNOSIS — M159 Polyosteoarthritis, unspecified: Secondary | ICD-10-CM | POA: Diagnosis not present

## 2015-01-16 DIAGNOSIS — Z79899 Other long term (current) drug therapy: Secondary | ICD-10-CM | POA: Diagnosis not present

## 2015-01-16 DIAGNOSIS — G20 Parkinson's disease: Secondary | ICD-10-CM | POA: Diagnosis not present

## 2015-02-02 ENCOUNTER — Ambulatory Visit (HOSPITAL_COMMUNITY)
Admission: RE | Admit: 2015-02-02 | Discharge: 2015-02-02 | Disposition: A | Discharge status: Home / Self Care | Payer: Medicare PPO | Source: Ambulatory Visit | Attending: Family Medicine | Admitting: Family Medicine

## 2015-02-02 DIAGNOSIS — Z1231 Encounter for screening mammogram for malignant neoplasm of breast: Secondary | ICD-10-CM | POA: Diagnosis not present

## 2015-02-07 DIAGNOSIS — M25511 Pain in right shoulder: Secondary | ICD-10-CM | POA: Diagnosis not present

## 2015-02-07 DIAGNOSIS — M25559 Pain in unspecified hip: Secondary | ICD-10-CM | POA: Diagnosis not present

## 2015-02-09 ENCOUNTER — Ambulatory Visit (HOSPITAL_COMMUNITY)
Admission: RE | Admit: 2015-02-09 | Discharge: 2015-02-09 | Disposition: A | Discharge status: Home / Self Care | Payer: Medicare PPO | Source: Ambulatory Visit | Attending: Family Medicine | Admitting: Family Medicine

## 2015-02-09 ENCOUNTER — Other Ambulatory Visit (HOSPITAL_COMMUNITY): Payer: Self-pay | Admitting: Family Medicine

## 2015-02-09 DIAGNOSIS — M25551 Pain in right hip: Secondary | ICD-10-CM | POA: Insufficient documentation

## 2015-02-09 DIAGNOSIS — S79911A Unspecified injury of right hip, initial encounter: Secondary | ICD-10-CM | POA: Diagnosis not present

## 2015-02-09 DIAGNOSIS — S4991XA Unspecified injury of right shoulder and upper arm, initial encounter: Secondary | ICD-10-CM | POA: Diagnosis not present

## 2015-02-09 DIAGNOSIS — M25511 Pain in right shoulder: Secondary | ICD-10-CM | POA: Diagnosis not present

## 2015-02-09 DIAGNOSIS — M150 Primary generalized (osteo)arthritis: Secondary | ICD-10-CM

## 2015-02-09 DIAGNOSIS — R937 Abnormal findings on diagnostic imaging of other parts of musculoskeletal system: Secondary | ICD-10-CM | POA: Diagnosis not present

## 2015-02-22 DIAGNOSIS — M25552 Pain in left hip: Secondary | ICD-10-CM | POA: Diagnosis not present

## 2015-02-27 DIAGNOSIS — H2510 Age-related nuclear cataract, unspecified eye: Secondary | ICD-10-CM | POA: Diagnosis not present

## 2015-03-02 DIAGNOSIS — M25552 Pain in left hip: Secondary | ICD-10-CM | POA: Diagnosis not present

## 2015-03-02 DIAGNOSIS — R2689 Other abnormalities of gait and mobility: Secondary | ICD-10-CM | POA: Diagnosis not present

## 2015-03-02 DIAGNOSIS — M25652 Stiffness of left hip, not elsewhere classified: Secondary | ICD-10-CM | POA: Diagnosis not present

## 2015-03-02 DIAGNOSIS — M545 Low back pain: Secondary | ICD-10-CM | POA: Diagnosis not present

## 2015-03-14 DIAGNOSIS — M25652 Stiffness of left hip, not elsewhere classified: Secondary | ICD-10-CM | POA: Diagnosis not present

## 2015-03-14 DIAGNOSIS — M25552 Pain in left hip: Secondary | ICD-10-CM | POA: Diagnosis not present

## 2015-03-14 DIAGNOSIS — R2689 Other abnormalities of gait and mobility: Secondary | ICD-10-CM | POA: Diagnosis not present

## 2015-03-14 DIAGNOSIS — M545 Low back pain: Secondary | ICD-10-CM | POA: Diagnosis not present

## 2015-03-23 DIAGNOSIS — M25552 Pain in left hip: Secondary | ICD-10-CM | POA: Diagnosis not present

## 2015-03-23 DIAGNOSIS — M545 Low back pain: Secondary | ICD-10-CM | POA: Diagnosis not present

## 2015-03-23 DIAGNOSIS — M25652 Stiffness of left hip, not elsewhere classified: Secondary | ICD-10-CM | POA: Diagnosis not present

## 2015-03-23 DIAGNOSIS — R2689 Other abnormalities of gait and mobility: Secondary | ICD-10-CM | POA: Diagnosis not present

## 2015-03-29 DIAGNOSIS — J22 Unspecified acute lower respiratory infection: Secondary | ICD-10-CM | POA: Diagnosis not present

## 2015-03-29 DIAGNOSIS — I10 Essential (primary) hypertension: Secondary | ICD-10-CM | POA: Diagnosis not present

## 2015-04-04 DIAGNOSIS — H25011 Cortical age-related cataract, right eye: Secondary | ICD-10-CM | POA: Diagnosis not present

## 2015-04-04 DIAGNOSIS — H2511 Age-related nuclear cataract, right eye: Secondary | ICD-10-CM | POA: Diagnosis not present

## 2015-04-04 DIAGNOSIS — H18411 Arcus senilis, right eye: Secondary | ICD-10-CM | POA: Diagnosis not present

## 2015-04-04 DIAGNOSIS — H2512 Age-related nuclear cataract, left eye: Secondary | ICD-10-CM | POA: Diagnosis not present

## 2015-04-06 DIAGNOSIS — M25552 Pain in left hip: Secondary | ICD-10-CM | POA: Diagnosis not present

## 2015-04-06 DIAGNOSIS — M25652 Stiffness of left hip, not elsewhere classified: Secondary | ICD-10-CM | POA: Diagnosis not present

## 2015-04-06 DIAGNOSIS — R2689 Other abnormalities of gait and mobility: Secondary | ICD-10-CM | POA: Diagnosis not present

## 2015-04-06 DIAGNOSIS — M545 Low back pain: Secondary | ICD-10-CM | POA: Diagnosis not present

## 2015-04-11 DIAGNOSIS — J22 Unspecified acute lower respiratory infection: Secondary | ICD-10-CM | POA: Diagnosis not present

## 2015-04-13 DIAGNOSIS — M25652 Stiffness of left hip, not elsewhere classified: Secondary | ICD-10-CM | POA: Diagnosis not present

## 2015-04-13 DIAGNOSIS — M545 Low back pain: Secondary | ICD-10-CM | POA: Diagnosis not present

## 2015-04-13 DIAGNOSIS — M25552 Pain in left hip: Secondary | ICD-10-CM | POA: Diagnosis not present

## 2015-04-13 DIAGNOSIS — R2689 Other abnormalities of gait and mobility: Secondary | ICD-10-CM | POA: Diagnosis not present

## 2015-04-17 DIAGNOSIS — H2512 Age-related nuclear cataract, left eye: Secondary | ICD-10-CM | POA: Diagnosis not present

## 2015-04-17 DIAGNOSIS — H2511 Age-related nuclear cataract, right eye: Secondary | ICD-10-CM | POA: Diagnosis not present

## 2015-04-17 DIAGNOSIS — H25812 Combined forms of age-related cataract, left eye: Secondary | ICD-10-CM | POA: Diagnosis not present

## 2015-04-17 DIAGNOSIS — Z961 Presence of intraocular lens: Secondary | ICD-10-CM | POA: Diagnosis not present

## 2015-04-18 DIAGNOSIS — H25011 Cortical age-related cataract, right eye: Secondary | ICD-10-CM | POA: Diagnosis not present

## 2015-04-18 DIAGNOSIS — H2511 Age-related nuclear cataract, right eye: Secondary | ICD-10-CM | POA: Diagnosis not present

## 2015-05-04 ENCOUNTER — Ambulatory Visit (INDEPENDENT_AMBULATORY_CARE_PROVIDER_SITE_OTHER): Payer: Medicare PPO | Admitting: Gastroenterology

## 2015-05-04 ENCOUNTER — Encounter: Payer: Self-pay | Admitting: Gastroenterology

## 2015-05-04 VITALS — BP 117/77 | HR 71 | Temp 98.4°F | Ht 67.0 in | Wt 186.6 lb

## 2015-05-04 DIAGNOSIS — K580 Irritable bowel syndrome with diarrhea: Secondary | ICD-10-CM

## 2015-05-04 NOTE — Patient Instructions (Signed)
1. Start probiotics. Take Restora one daily. We have given you two weeks of samples. Once finished you may buy over the counter. Other brands that are acceptable are Align, [employer].  2. Call if you have ongoing symptoms. Would consider stool test to check for infection as discussed today.

## 2015-05-04 NOTE — Progress Notes (Signed)
      Primary Care Physician: Maggie Font, MD  Primary Gastroenterologist:  Garfield Cornea, MD   Chief Complaint  Patient presents with  . Diarrhea    doing better now.    HPI: [name] is a 69 y.o. female here for follow-up of GERD and IBS. Last seen in October 2015. Chronically has intermittent postprandial loose stools. Avoids dietary triggers for the most part. Does not tolerate dairy products. Usually has loose stool about every other day. Notes it's worse with anxiety. Taking Imodium causes constipation. No longer requires Linzess.   Recently had acute diarrhea lasting for about a day. 5-6 BMs per hour. Associate with abdominal cramping. 2 ill contacts. Made an appointment when this occurred. Now improving but not quite back at baseline. Denies any blood in the stool or melena. Rarely has a solid stool in general. No further abdominal pain. Heartburn well-controlled. Takes prilosec prn only.    Current Outpatient Prescriptions  Medication Sig Dispense Refill  . ALPRAZolam (XANAX) 0.5 MG tablet Take 0.5 mg by mouth daily as needed. For anxiety or sleep    . amLODipine-valsartan (EXFORGE) 5-160 MG per tablet Take 1 tablet by mouth daily.    [name] aspirin EC 81 MG tablet Take 81 mg by mouth every morning.    . Ergocalciferol (VITAMIN D2 PO) Take by mouth.    . furosemide (LASIX) 20 MG tablet Take 20 mg by mouth at bedtime.     [name] HYDROcodone-acetaminophen (NORCO) 5-325 MG per tablet Take 1-2 tablets by mouth every 4 (four) hours as needed. 30 tablet 0  . omeprazole (PRILOSEC) 20 MG capsule Take 20 mg by mouth daily as needed. For acid reflux    . ZETIA 10 MG tablet Take 10 mg by mouth daily.      No current facility-administered medications for this visit.    Allergies as of 05/04/2015 - Review Complete 05/04/2015  Allergen Reaction Noted  . Morphine and related Hypertension 11/11/2013    ROS:  General: Negative for anorexia, weight loss, fever, chills, fatigue,  weakness. ENT: Negative for hoarseness, difficulty swallowing , nasal congestion. CV: Negative for chest pain, angina, palpitations, dyspnea on exertion, peripheral edema.  Respiratory: Negative for dyspnea at rest, dyspnea on exertion, cough, sputum, wheezing.  GI: See history of present illness. GU:  Negative for dysuria, hematuria, urinary incontinence, urinary frequency, nocturnal urination.  Endo: Negative for unusual weight change.    Physical Examination:   BP 117/77 mmHg  Pulse 71  Temp(Src) 98.4 F (36.9 C) (Oral)  Ht 5\' 7"  (1.702 m)  Wt 186 lb 9.6 oz (84.641 kg)  BMI 29.22 kg/m2  General: Well-nourished, well-developed in no acute distress.  Eyes: No icterus. Mouth: Oropharyngeal mucosa moist and pink , no lesions erythema or exudate. Lungs: Clear to auscultation bilaterally.  Heart: Regular rate and rhythm, no murmurs rubs or gallops.  Abdomen: Bowel sounds are normal, nontender, nondistended, no hepatosplenomegaly or masses, no abdominal bruits or hernia , no rebound or guarding.   Extremities: No lower extremity edema. No clubbing or deformities. Neuro: Alert and oriented x 4   Skin: Warm and dry, no jaundice.   Psych: Alert and cooperative, normal mood and affect.   Imaging Studies: No results found.

## 2015-05-05 NOTE — Assessment & Plan Note (Addendum)
Likely recent gastroenteritis on top of IBS. Approaching baseline but not quite there. She did take a Zpak recently for URI.  Discussed options of antispasmodics versus stool studies.She declines on both of these. Intolerant to fiber supplements, worsens her diarrhea. She would like to try probiotics. Recommend restora once daily for 4 weeks.if ongoing symptoms or worsening symptoms she will call and we would arrange for stool studies at that time. Return to the office in one year or sooner if needed.

## 2015-05-08 NOTE — Progress Notes (Signed)
cc'ed to pcp °

## 2015-05-29 DIAGNOSIS — M791 Myalgia: Secondary | ICD-10-CM | POA: Diagnosis not present

## 2015-05-29 DIAGNOSIS — I10 Essential (primary) hypertension: Secondary | ICD-10-CM | POA: Diagnosis not present

## 2015-06-12 DIAGNOSIS — H2511 Age-related nuclear cataract, right eye: Secondary | ICD-10-CM | POA: Diagnosis not present

## 2015-06-12 DIAGNOSIS — H25811 Combined forms of age-related cataract, right eye: Secondary | ICD-10-CM | POA: Diagnosis not present

## 2015-06-12 DIAGNOSIS — Z961 Presence of intraocular lens: Secondary | ICD-10-CM | POA: Diagnosis not present

## 2015-06-13 DIAGNOSIS — R000 Tachycardia, unspecified: Secondary | ICD-10-CM | POA: Diagnosis not present

## 2015-06-21 DIAGNOSIS — M1812 Unilateral primary osteoarthritis of first carpometacarpal joint, left hand: Secondary | ICD-10-CM | POA: Diagnosis not present

## 2015-06-26 DIAGNOSIS — M5412 Radiculopathy, cervical region: Secondary | ICD-10-CM | POA: Diagnosis not present

## 2015-07-04 DIAGNOSIS — M542 Cervicalgia: Secondary | ICD-10-CM | POA: Diagnosis not present

## 2015-09-13 ENCOUNTER — Ambulatory Visit (INDEPENDENT_AMBULATORY_CARE_PROVIDER_SITE_OTHER)
Admission: RE | Admit: 2015-09-13 | Discharge: 2015-09-13 | Disposition: A | Discharge status: Home / Self Care | Payer: Medicare Other | Source: Ambulatory Visit | Attending: Pulmonary Disease | Admitting: Pulmonary Disease

## 2015-09-13 ENCOUNTER — Ambulatory Visit (INDEPENDENT_AMBULATORY_CARE_PROVIDER_SITE_OTHER): Payer: Medicare Other | Admitting: Pulmonary Disease

## 2015-09-13 ENCOUNTER — Encounter: Payer: Self-pay | Admitting: Pulmonary Disease

## 2015-09-13 VITALS — BP 116/72 | HR 88 | Ht 67.0 in | Wt 193.0 lb

## 2015-09-13 DIAGNOSIS — R0600 Dyspnea, unspecified: Secondary | ICD-10-CM | POA: Diagnosis not present

## 2015-09-13 DIAGNOSIS — R0689 Other abnormalities of breathing: Secondary | ICD-10-CM

## 2015-09-13 NOTE — Patient Instructions (Signed)
We will schedule you for a chest x-ray and lung function tests.  Return to clinic in 1 month

## 2015-09-13 NOTE — Progress Notes (Signed)
Subjective:    Patient ID: [name], female    DOB: 11/18/1945, 70 y.o.   MRN: [id number]  HPI Consult for evaluation of dyspnea.  [name] is a 70 year old with past medical history of hypertension, hyperlipidemia, atypical chest pain, irritable bowel syndrome, GERD. She was evaluated by Dr. Nadyne Coombes, Cardiology for atypical chest pain, palpitation. She had an echocardiogram and a stress test that was equivocal for ischemia (see below). She has been referred to pulmonary for evaluation of any underlying pulmonary disease.  Her chief complaint today is dyspnea on exertion for the past 6 months. This is associated with wheezing. She does not have any cough, sputum production, congestion. She had been on inhalers in the past but does not recall the name. She states that this helps with the breathing at that time.  DATA: Echo 08/17/15 Normal LV cavity size and function, moderate LVH, grade 1 diastolic dysfunction, EF [id number] Normal and we cavity size and function, mild concentric hypertrophy of RV Moderate PH. PASP 41, elevated right heart pressure  Treadmill stress test 08/18/15 Equivocal for ischemia. Upsloping ST depressions.  Social History: She has a 30-pack-year smoking history. Quit in 2002. No alcohol, drug use.  Family history: Brother-Gastric cancer Mother-heart attack  Past Medical History  Diagnosis Date  . IBS (irritable bowel syndrome)     diarrhea predominant  . GERD (gastroesophageal reflux disease)     negative H pylori stool Ag  . HTN (hypertension)   . Chronic back pain   . Tubular adenoma of colon   . Diverticulosis   . Umbilical hernia   . Hyperplastic colon polyp 11/08/08  . Arthritis   . Wears glasses   . Bronchiolitis     acute  . High cholesterol   . Depression   . Anxiety      Current outpatient prescriptions:  .  ALPRAZolam (XANAX) 0.5 MG tablet, Take 0.5 mg by mouth daily as needed. For anxiety or sleep, Disp: , Rfl:  .  amLODipine-valsartan  (EXFORGE) 5-160 MG per tablet, Take 1 tablet by mouth daily., Disp: , Rfl:  .  aspirin EC 81 MG tablet, Take 81 mg by mouth every morning., Disp: , Rfl:  .  Ergocalciferol (VITAMIN D2 PO), Take 1 tablet by mouth every 7 (seven) days. , Disp: , Rfl:  .  furosemide (LASIX) 20 MG tablet, Take 20 mg by mouth at bedtime. , Disp: , Rfl:  .  HYDROcodone-acetaminophen (NORCO) 5-325 MG per tablet, Take 1-2 tablets by mouth every 4 (four) hours as needed., Disp: 30 tablet, Rfl: 0 .  omeprazole (PRILOSEC) 20 MG capsule, Take 20 mg by mouth daily as needed. For acid reflux, Disp: , Rfl:  .  Pitavastatin Calcium (LIVALO) 2 MG TABS, Take 1 tablet by mouth every other day., Disp: , Rfl:   Review of Systems Dyspnea on exertion, wheezing. No cough, sputum production, hemoptysis. No chest pain, palpitation. No nausea, vomiting, diarrhea, constipation. No fevers, chills, loss of weight, loss of appetite. All other review of systems are negative.    Objective:   Physical Exam Blood pressure 116/72, pulse 88, height 5\' 7"  (1.702 m), weight 193 lb (87.544 kg), SpO2 99 %. Gen: No apparent distress Neuro: No gross focal deficits. Neck: No JVD, lymphadenopathy, thyromegaly. RS: Clear, No wheeze or crackles CVS: S1-S2 heard, no murmurs rubs gallops. Abdomen: Soft, positive bowel sounds. Extremities: No edema.    Assessment & Plan:  Dyspnea on exertion.  She may have COPD based on  her smoking history, symptoms of wheezing. Inhalers have helped in the past but she is not on any now. I will evaluate by getting a chest x-ray and pulmonary function test. She'll return to clinic in a month to discuss results and initiate any therapy if necessary.  Plan: - PFTs - CXR.  Marshell Garfinkel MD [facility] Pager [phone] If no answer or after 3pm call: [phone] 09/13/2015, 4:53 PM

## 2015-09-18 ENCOUNTER — Telehealth: Payer: Self-pay | Admitting: Pulmonary Disease

## 2015-09-18 NOTE — Telephone Encounter (Signed)
Notes Recorded by Marshell Garfinkel, MD on 09/13/2015 at 5:04 PM Please let the pt know that the CXR shows finding that may indicate COPD. We will review the PFTs when they are done and get back to her. --  LMTCB x1

## 2015-09-18 NOTE — Telephone Encounter (Signed)
[phone] calling back

## 2015-09-18 NOTE — Telephone Encounter (Signed)
Spoke with pt and notified of results per Dr. Mannam.  Pt verbalized understanding and denied any questions. 

## 2015-09-27 ENCOUNTER — Encounter: Payer: Self-pay | Admitting: Pulmonary Disease

## 2015-10-19 ENCOUNTER — Ambulatory Visit (INDEPENDENT_AMBULATORY_CARE_PROVIDER_SITE_OTHER): Payer: Medicare Other | Admitting: Pulmonary Disease

## 2015-10-19 ENCOUNTER — Encounter: Payer: Self-pay | Admitting: Pulmonary Disease

## 2015-10-19 ENCOUNTER — Ambulatory Visit (HOSPITAL_COMMUNITY)
Admission: RE | Admit: 2015-10-19 | Discharge: 2015-10-19 | Disposition: A | Discharge status: Home / Self Care | Payer: Medicare Other | Source: Ambulatory Visit | Attending: Pulmonary Disease | Admitting: Pulmonary Disease

## 2015-10-19 VITALS — BP 120/74 | HR 83 | Temp 98.3°F | Ht 67.0 in | Wt 190.0 lb

## 2015-10-19 DIAGNOSIS — R0689 Other abnormalities of breathing: Secondary | ICD-10-CM

## 2015-10-19 DIAGNOSIS — R0600 Dyspnea, unspecified: Secondary | ICD-10-CM

## 2015-10-19 LAB — PULMONARY FUNCTION TEST
DL/VA % pred: 74 %
DL/VA: 3.86 ml/min/mmHg/L
DLCO unc % pred: 43 %
DLCO unc: 12.24 ml/min/mmHg
FEF 25-75 Post: 2.36 L/sec
FEF 25-75 Pre: 1.88 L/sec
FEF2575-%Change-Post: 25 %
FEF2575-%Pred-Post: 123 %
FEF2575-%Pred-Pre: 97 %
FEV1-%Change-Post: 5 %
FEV1-%Pred-Post: 81 %
FEV1-%Pred-Pre: 76 %
FEV1-Post: 1.72 L
FEV1-Pre: 1.63 L
FEV1FVC-%Change-Post: 5 %
FEV1FVC-%Pred-Pre: 107 %
FEV6-%Change-Post: 0 %
FEV6-%Pred-Post: 75 %
FEV6-%Pred-Pre: 74 %
FEV6-Post: 1.97 L
FEV6-Pre: 1.96 L
FEV6FVC-%Pred-Post: 104 %
FEV6FVC-%Pred-Pre: 104 %
FVC-%Change-Post: 0 %
FVC-%Pred-Post: 72 %
FVC-%Pred-Pre: 72 %
FVC-Post: 1.97 L
FVC-Pre: 1.96 L
Post FEV1/FVC ratio: 87 %
Post FEV6/FVC ratio: 100 %
Pre FEV1/FVC ratio: 83 %
Pre FEV6/FVC Ratio: 100 %
RV % pred: 71 %
RV: 1.67 L
TLC % pred: 68 %
TLC: 3.74 L

## 2015-10-19 MED ORDER — ALBUTEROL SULFATE (2.5 MG/3ML) 0.083% IN NEBU
2.5000 mg | INHALATION_SOLUTION | Freq: Once | RESPIRATORY_TRACT | Status: AC
  Administered 2015-10-19: 2.5 mg via RESPIRATORY_TRACT

## 2015-10-19 MED ORDER — FLUTICASONE-SALMETEROL 250-50 MCG/DOSE IN AEPB: 1.0000 | INHALATION_SPRAY | Freq: Two times a day (BID) | RESPIRATORY_TRACT | Status: DC

## 2015-10-19 NOTE — Patient Instructions (Signed)
We will start you on Advair 250/50. Advised to continue with exercise and go on a diet regimen for weight loss. Return to clinic in 6 months.

## 2015-10-19 NOTE — Addendum Note (Signed)
Addended by: Beckie Busing on: 10/19/2015 03:19 PM   Modules accepted: Orders

## 2015-10-19 NOTE — Progress Notes (Signed)
Subjective:    Patient ID: [name], female    DOB: [birth year], 70 y.o.   MRN: [id number]  HPI Follow up evaluation of dyspnea.  [name] is a 70 year old with past medical history of hypertension, hyperlipidemia, atypical chest pain, irritable bowel syndrome, GERD. She was evaluated by Dr. Nadyne Coombes, Cardiology for atypical chest pain, palpitation. She had an echocardiogram and a stress test that was equivocal for ischemia (see below). She has been referred to pulmonary for evaluation of any underlying pulmonary disease.  Her chief complaint today is dyspnea on exertion for the past 6 months. This is associated with wheezing. She does not have any cough, sputum production, congestion. She had been on inhalers in the past but does not recall the name. She states that this helps with the breathing at that time.  DATA: Echo 08/17/15 Normal LV cavity size and function, moderate LVH, grade 1 diastolic dysfunction, EF [id number] Normal and we cavity size and function, mild concentric hypertrophy of RV Moderate PH. PASP 41, elevated right heart pressure  Treadmill stress test 08/18/15 Equivocal for ischemia. Upsloping ST depressions.  CXR 09/13/15 Hyperinflation, dextroscoliosis  PFTs 10/19/15 FVC 1.96 (72%) FEV1 1.63 (76%) F/F 83 TLC 68% DLCO 43% Mild restriction, severe reduction in DLCO that corrects for alveolar volume.  Social History: She has a 30-pack-year smoking history. Quit in 2002. No alcohol, drug use.  Family history: Brother-Gastric cancer Mother-heart attack  Past Medical History  Diagnosis Date  . IBS (irritable bowel syndrome)     diarrhea predominant  . GERD (gastroesophageal reflux disease)     negative H pylori stool Ag  . HTN (hypertension)   . Chronic back pain   . Tubular adenoma of colon   . Diverticulosis   . Umbilical hernia   . Hyperplastic colon polyp 11/08/08  . Arthritis   . Wears glasses   . Bronchiolitis     acute  . High cholesterol   .  Depression   . Anxiety      Current outpatient prescriptions:  .  ALPRAZolam (XANAX) 0.5 MG tablet, Take 0.5 mg by mouth daily as needed. For anxiety or sleep, Disp: , Rfl:  .  amLODipine-valsartan (EXFORGE) 5-160 MG per tablet, Take 1 tablet by mouth daily., Disp: , Rfl:  .  aspirin EC 81 MG tablet, Take 81 mg by mouth every morning., Disp: , Rfl:  .  furosemide (LASIX) 20 MG tablet, Take 20 mg by mouth at bedtime. , Disp: , Rfl:  .  HYDROcodone-acetaminophen (NORCO) 5-325 MG per tablet, Take 1-2 tablets by mouth every 4 (four) hours as needed., Disp: 30 tablet, Rfl: 0 .  omeprazole (PRILOSEC) 20 MG capsule, Take 20 mg by mouth daily as needed. For acid reflux, Disp: , Rfl:  .  Pitavastatin Calcium (LIVALO) 2 MG TABS, Take 1 tablet by mouth every other day., Disp: , Rfl:  .  Vitamin D, Ergocalciferol, (DRISDOL) 50000 units CAPS capsule, Take 50,000 Units by mouth once a week., Disp: , Rfl: 11  Review of Systems Dyspnea on exertion, wheezing. No cough, sputum production, hemoptysis. No chest pain, palpitation. No nausea, vomiting, diarrhea, constipation. No fevers, chills, loss of weight, loss of appetite. All other review of systems are negative.    Objective:   Physical Exam Blood pressure 120/74, pulse 83, temperature 98.3 F (36.8 C), temperature source Oral, height 5\' 7"  (1.702 m), weight 190 lb (86.183 kg), SpO2 97 %. Gen: No apparent distress Neuro: No gross focal deficits. Neck: No  JVD, lymphadenopathy, thyromegaly. RS: Clear, No wheeze or crackles CVS: S1-S2 heard, no murmurs rubs gallops. Abdomen: Soft, positive bowel sounds. Extremities: No edema.    Assessment & Plan:  Dyspnea on exertion. This is likely from deconditioning, obesity. PFTs were reviewed today. They show mild restriction which I believe is due to her dextroscoliosis of the spine, obesity. There is no evidence of interstitial lung disease. She does have reduction in diffusion capacity but this corrects  for alveolar volume. Although she does not have any overt obstruction by/criteria there is scooping of her expiratory loop suggestive of mild obstruction. She has responded well to inhalers in the past and I will try her on Advair. I also encouraged her to keep up with exercise and lose weight.  Plan: - Start Advair  250/50 - Weight loss and exercise.  Return in 6 months.  Marshell Garfinkel MD [facility] Pager [phone] If no answer or after 3pm call: [phone] 10/19/2015, 2:58 PM

## 2015-11-23 ENCOUNTER — Ambulatory Visit: Payer: Medicare Other | Admitting: Gastroenterology

## 2015-11-27 ENCOUNTER — Ambulatory Visit (INDEPENDENT_AMBULATORY_CARE_PROVIDER_SITE_OTHER): Payer: Medicare Other | Admitting: Gastroenterology

## 2015-11-27 ENCOUNTER — Encounter: Payer: Self-pay | Admitting: Gastroenterology

## 2015-11-27 VITALS — BP 125/73 | HR 90 | Temp 97.9°F | Ht 67.0 in | Wt 188.8 lb

## 2015-11-27 DIAGNOSIS — K5900 Constipation, unspecified: Secondary | ICD-10-CM | POA: Diagnosis not present

## 2015-11-27 NOTE — Progress Notes (Signed)
Referring Provider: Iona Beard, MD Primary Care Physician:  Maggie Font, MD  Primary GI: Dr. Gala Romney   Chief Complaint  Patient presents with  . Constipation  . Abdominal Pain    HPI:   [name] is a 70 y.o. female presenting today with a history of GERD and constipation. Surveillance colonoscopy for history of adenomas due in 2019. Last seen Oct 2015.   Recently had a GI illness, family had as well. Back to baseline of constipation. Can't "get it right". Notes abdominal discomfort. Trying to drink smoothies to keep her regular. Notes left-sided abdominal discomfort. Movement worsens. Chronic abdominal pain. BM once a day, may be up to several days without a BM. Tries to drink a fruit smoothie to help. Feels that BMs are unproductive.   Past Medical History  Diagnosis Date  . IBS (irritable bowel syndrome)   . GERD (gastroesophageal reflux disease)     negative H pylori stool Ag  . HTN (hypertension)   . Chronic back pain   . Tubular adenoma of colon   . Diverticulosis   . Umbilical hernia   . Hyperplastic colon polyp 11/08/08  . Arthritis   . Wears glasses   . Bronchiolitis     acute  . High cholesterol   . Depression   . Anxiety     Past Surgical History  Procedure Laterality Date  . Cholecystectomy  1980  . Nephrectomy  1975    left-infection  . Cervical disc surgery  2007  . Ectopic pregnancy surgery      bilat  . Appendectomy    . Abdominal hysterectomy  1985  . Colonoscopy  11/08/08    Dr. Gala Romney- hemorrhoids, pancolonic diverticula,,hyperplastic polyp  . Colonoscopy   08/05/2005    [id number] rectum/Diminutive polyp at 24 cm/Pancolonic diverticula/benign-appearing ulcer in the mid descending colon  . Colonoscopy N/A 03/30/2013    [id number] anal canal hemorrhoids. Single colonic polyp-removed as found above. Colonic diverticulosis.  . Trigger finger release Right 08/23/2013    Procedure: RIGHT INDEX FINGER TRIGGER RELEASE ;  Surgeon: Jolyn Nap, MD;  Location: [place];  Service: Orthopedics;  Laterality: Right;  . Carpal tunnel release Right 08/23/2013    Procedure: RIGHT ENDOSCOPIC CARPAL  TUNNEL RELEASE;  Surgeon: Jolyn Nap, MD;  Location: [place];  Service: Orthopedics;  Laterality: Right;  . Breast surgery  1989    both sides  . Dorsal compartment release Left 12/12/2014    Procedure: LEFT WRIST RELEASE DORSAL COMPARTMENT (DEQUERVAIN);  Surgeon: Milly Jakob, MD;  Location: [place];  Service: Orthopedics;  Laterality: Left;    Current Outpatient Prescriptions  Medication Sig Dispense Refill  . ALPRAZolam (XANAX) 0.5 MG tablet Take 0.5 mg by mouth daily as needed. For anxiety or sleep    . amLODipine-valsartan (EXFORGE) 5-160 MG per tablet Take 1 tablet by mouth daily.    [name] aspirin EC 81 MG tablet Take 81 mg by mouth every morning.    . Fluticasone-Salmeterol (ADVAIR DISKUS) 250-50 MCG/DOSE AEPB Inhale 1 puff into the lungs 2 (two) times daily. 60 each 0  . furosemide (LASIX) 20 MG tablet Take 20 mg by mouth at bedtime.     [name] omeprazole (PRILOSEC) 20 MG capsule Take 20 mg by mouth daily as needed. For acid reflux    . Pitavastatin Calcium (LIVALO) 2 MG TABS Take 1 tablet by mouth every other day.    . Vitamin D, Ergocalciferol, (  DRISDOL) 50000 units CAPS capsule Take 50,000 Units by mouth once a week.  11  . HYDROcodone-acetaminophen (NORCO) 5-325 MG per tablet Take 1-2 tablets by mouth every 4 (four) hours as needed. 30 tablet 0   No current facility-administered medications for this visit.    Allergies as of 11/27/2015 - Review Complete 11/27/2015  Allergen Reaction Noted  . Morphine and related Hypertension 11/11/2013    Family History  Problem Relation Age of Onset  . Cancer Brother 60    gastric  . Heart attack Mother     Social History   Social History  . Marital Status: Married    Spouse Name: [name]  . Number of Children: 1  . Years of  Education: some coll.   Occupational History  . 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 Smoking status: Former Smoker -- 0.25 packs/day for 30 years    Types: Cigarettes    Quit date: 08/19/2000  . Smokeless tobacco: Never Used     Comment: "smoked but not inhaled"  . Alcohol Use: No     Comment: maybe one a month wine  . Drug Use: No  . Sexual Activity: Yes    Birth Control/ Protection: Surgical   Other Topics Concern  . None   Social History Narrative   Consumes 2 cups of caffeine daily    Review of Systems: As mentioned in HPI   Physical Exam: BP 125/73 mmHg  Pulse 90  Temp(Src) 97.9 F (36.6 C)  Ht 5\' 7"  (1.702 m)  Wt 188 lb 12.8 oz (85.639 kg)  BMI 29.56 kg/m2 General:   Alert and oriented. No distress noted. Pleasant and cooperative.  Head:  Normocephalic and atraumatic. Eyes:  Conjuctiva clear without scleral icterus. Heart:  S1, S2 present without murmurs, rubs, or gallops. Regular rate and rhythm. Abdomen:  +BS, soft, non-tender and non-distended. No rebound or guarding. No HSM or masses noted. Msk:  Symmetrical without gross deformities. Normal posture. Extremities:  Without edema. Neurologic:  Alert and  oriented x4;  grossly normal neurologically. Psych:  Alert and cooperative. Normal mood and affect.

## 2015-11-27 NOTE — Patient Instructions (Signed)
Start taking Linzess 72 mcg 1 capsule each morning, 30 minutes before breakfast. I have given you samples.   Please call me with an update in about a week. We may need to do a CT scan.   We will see you in 3 months.

## 2015-11-28 NOTE — Assessment & Plan Note (Signed)
Unproductive BMs as likely source for vague LLQ discomfort. No acute findings on exam. History of chronic abdominal pain. Will trial Linzess 72 mcg once daily. Samples provided. Patient to give progress report in about a week. Consider CT if no improvement. Return in 3 months and will check ifobt at that time for routine screening.

## 2015-11-29 NOTE — Progress Notes (Signed)
cc'ed to pcp °

## 2015-12-07 ENCOUNTER — Ambulatory Visit: Payer: Medicare Other | Admitting: Nurse Practitioner

## 2015-12-12 ENCOUNTER — Telehealth: Payer: Self-pay

## 2015-12-12 NOTE — Telephone Encounter (Signed)
Pt called and states that the the Linzess was too strong (51mcg)  States she needs to let Vicente Males know she is still having some constipation and abdominal pain

## 2015-12-13 NOTE — Telephone Encounter (Signed)
Tried to call pt- NA-LMOM with recommendations. Samples are at the front desk with instructions.

## 2015-12-13 NOTE — Telephone Encounter (Signed)
We can provide samples of Amitiza 8 mcg po BID with food.  Progress report after that in 1 week.  If no improvement in pain, will proceed with CT.

## 2015-12-14 NOTE — Telephone Encounter (Signed)
Pt is aware and will call and let us know how she is doing.

## 2015-12-20 ENCOUNTER — Telehealth: Payer: Self-pay | Admitting: Internal Medicine

## 2015-12-20 NOTE — Telephone Encounter (Signed)
[phone]  PLEASE CALL PATIENT ABOUT HER MEDICATIONS   MEDICATION SHE WAS GIVEN FOR CONSTIPATION IS ALMOST WORKING TOO MUCH.  SIDE STILL HURTING AS WELL.  WANTS TO KNOW IF SHE WILL BE SCHEDULED FOR A CT

## 2015-12-20 NOTE — Telephone Encounter (Signed)
Decrease Linzess to 72 every other day. Try that for the next 2 weeks and let us know.

## 2015-12-21 ENCOUNTER — Other Ambulatory Visit: Payer: Self-pay

## 2015-12-21 DIAGNOSIS — R1032 Left lower quadrant pain: Secondary | ICD-10-CM

## 2015-12-21 DIAGNOSIS — R109 Unspecified abdominal pain: Secondary | ICD-10-CM

## 2015-12-21 NOTE — Telephone Encounter (Signed)
Yes, may order CT and we will go from there.

## 2015-12-21 NOTE — Telephone Encounter (Signed)
Pt is set for CT on 06/16 @ 545pm. Pt is aware to pick up contrast

## 2015-12-21 NOTE — Telephone Encounter (Signed)
Candy, please schedule CT. Pt said you can reach her on her cell phone.

## 2015-12-21 NOTE — Telephone Encounter (Signed)
Pt came by the office, she is not on linzess 95mcg, AS had changed her to [place]. Pt said it is too strong for her too. I explained to her that the first few days she could have diarrhea but it should even out over time. She is still drinking her smoothies. She is concerned about the pain she is still having and wants to schedule the CT.

## 2015-12-22 ENCOUNTER — Ambulatory Visit (HOSPITAL_COMMUNITY)
Admission: RE | Admit: 2015-12-22 | Discharge: 2015-12-22 | Disposition: A | Discharge status: Home / Self Care | Payer: Medicare Other | Source: Ambulatory Visit | Attending: Gastroenterology | Admitting: Gastroenterology

## 2015-12-22 DIAGNOSIS — Z905 Acquired absence of kidney: Secondary | ICD-10-CM | POA: Diagnosis not present

## 2015-12-22 DIAGNOSIS — R1032 Left lower quadrant pain: Secondary | ICD-10-CM | POA: Diagnosis not present

## 2015-12-22 DIAGNOSIS — K838 Other specified diseases of biliary tract: Secondary | ICD-10-CM | POA: Insufficient documentation

## 2015-12-22 DIAGNOSIS — N281 Cyst of kidney, acquired: Secondary | ICD-10-CM | POA: Insufficient documentation

## 2015-12-22 DIAGNOSIS — K439 Ventral hernia without obstruction or gangrene: Secondary | ICD-10-CM | POA: Diagnosis not present

## 2015-12-22 DIAGNOSIS — Z9049 Acquired absence of other specified parts of digestive tract: Secondary | ICD-10-CM | POA: Insufficient documentation

## 2015-12-22 DIAGNOSIS — K5730 Diverticulosis of large intestine without perforation or abscess without bleeding: Secondary | ICD-10-CM | POA: Insufficient documentation

## 2015-12-22 DIAGNOSIS — R109 Unspecified abdominal pain: Secondary | ICD-10-CM | POA: Insufficient documentation

## 2015-12-22 MED ORDER — DIATRIZOATE MEGLUMINE & SODIUM 66-10 % PO SOLN
ORAL | Status: AC
  Filled 2015-12-22: qty 30

## 2015-12-22 MED ORDER — IOPAMIDOL (ISOVUE-300) INJECTION 61%
100.0000 mL | Freq: Once | INTRAVENOUS | Status: AC | PRN
  Administered 2015-12-22: 100 mL via INTRAVENOUS

## 2015-12-24 NOTE — Progress Notes (Signed)
Quick Note:  She has a large right renal cyst that is benign appearing. She has a left lateral abdominal wall hernia. No signs of diverticulitis. She has been evaluated by Dr. Dalbert Batman in the past for consideration of hernia repair but it was felt this was not the cause of her symptoms. Chronic abdominal pain noted. Good news is, she has no acute findings. If Amitiza is still too strong, she could decrease to 8 mcg once daily. I will be seeing her back in August. I am not convinced the hernia is causing her symptoms, but we could refer back to Dr. Dalbert Batman. 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 May need pain management, but we can refer to Dr. Dalbert Batman first. Also, tell her Happy Rudene Anda! (it was 6/10). ______

## 2015-12-29 ENCOUNTER — Telehealth: Payer: Self-pay

## 2015-12-29 NOTE — Telephone Encounter (Signed)
Pt is calling wanting her results from her CT scan she had last week. States she is still having pain in her side and taking a supplement over the counter.   Please advise

## 2016-01-01 ENCOUNTER — Other Ambulatory Visit: Payer: Self-pay

## 2016-01-01 DIAGNOSIS — R1032 Left lower quadrant pain: Secondary | ICD-10-CM

## 2016-01-01 NOTE — Telephone Encounter (Signed)
Please see result note Vicente Males left 6/18 regarding results. Based on her plan, if still having pain lets refer back to Dr. Dalbert Batman. Further recommendations by Vicente Males when she returns tomorrow.

## 2016-01-01 NOTE — Telephone Encounter (Signed)
I spoke with the pt, see CT results note.

## 2016-01-01 NOTE — Telephone Encounter (Signed)
Forwarding to Almyra Free, this is Dr. Roseanne Kaufman pt.

## 2016-01-01 NOTE — Telephone Encounter (Signed)
REFERRAL HAS BEEN MADE  

## 2016-01-02 NOTE — Telephone Encounter (Signed)
Noted. Agree.

## 2016-01-31 ENCOUNTER — Other Ambulatory Visit (HOSPITAL_COMMUNITY): Payer: Self-pay | Admitting: Family Medicine

## 2016-01-31 DIAGNOSIS — Z1231 Encounter for screening mammogram for malignant neoplasm of breast: Secondary | ICD-10-CM

## 2016-02-05 ENCOUNTER — Ambulatory Visit (HOSPITAL_COMMUNITY)
Admission: RE | Admit: 2016-02-05 | Discharge: 2016-02-05 | Disposition: A | Discharge status: Home / Self Care | Payer: Medicare Other | Source: Ambulatory Visit | Attending: Family Medicine | Admitting: Family Medicine

## 2016-02-05 DIAGNOSIS — Z1231 Encounter for screening mammogram for malignant neoplasm of breast: Secondary | ICD-10-CM | POA: Insufficient documentation

## 2016-02-16 ENCOUNTER — Other Ambulatory Visit (HOSPITAL_COMMUNITY): Payer: Self-pay | Admitting: Family Medicine

## 2016-02-16 DIAGNOSIS — M545 Low back pain: Secondary | ICD-10-CM

## 2016-02-26 ENCOUNTER — Other Ambulatory Visit: Payer: Self-pay | Admitting: Neurosurgery

## 2016-02-26 DIAGNOSIS — M48061 Spinal stenosis, lumbar region without neurogenic claudication: Secondary | ICD-10-CM

## 2016-02-27 ENCOUNTER — Ambulatory Visit: Payer: Medicare Other | Admitting: Gastroenterology

## 2016-02-28 ENCOUNTER — Ambulatory Visit
Admission: RE | Admit: 2016-02-28 | Discharge: 2016-02-28 | Disposition: A | Discharge status: Home / Self Care | Payer: Medicare Other | Source: Ambulatory Visit | Attending: Neurosurgery | Admitting: Neurosurgery

## 2016-02-28 DIAGNOSIS — M48061 Spinal stenosis, lumbar region without neurogenic claudication: Secondary | ICD-10-CM

## 2016-02-28 MED ORDER — GADOBENATE DIMEGLUMINE 529 MG/ML IV SOLN
20.0000 mL | Freq: Once | INTRAVENOUS | Status: AC | PRN
  Administered 2016-02-28: 20 mL via INTRAVENOUS

## 2016-02-29 ENCOUNTER — Ambulatory Visit (HOSPITAL_COMMUNITY): Payer: Medicare Other

## 2016-03-21 ENCOUNTER — Ambulatory Visit: Payer: Medicare Other | Admitting: Gastroenterology

## 2016-04-16 ENCOUNTER — Encounter: Payer: Self-pay | Admitting: Nurse Practitioner

## 2016-04-16 ENCOUNTER — Ambulatory Visit (HOSPITAL_COMMUNITY)
Admission: RE | Admit: 2016-04-16 | Discharge: 2016-04-16 | Disposition: A | Discharge status: Home / Self Care | Payer: Medicare Other | Source: Ambulatory Visit | Attending: Nurse Practitioner | Admitting: Nurse Practitioner

## 2016-04-16 ENCOUNTER — Ambulatory Visit (INDEPENDENT_AMBULATORY_CARE_PROVIDER_SITE_OTHER): Payer: Medicare Other | Admitting: Nurse Practitioner

## 2016-04-16 VITALS — BP 124/85 | HR 75 | Temp 98.2°F | Ht 67.0 in | Wt 187.2 lb

## 2016-04-16 DIAGNOSIS — R1084 Generalized abdominal pain: Secondary | ICD-10-CM | POA: Insufficient documentation

## 2016-04-16 DIAGNOSIS — R109 Unspecified abdominal pain: Secondary | ICD-10-CM | POA: Insufficient documentation

## 2016-04-16 DIAGNOSIS — K5900 Constipation, unspecified: Secondary | ICD-10-CM

## 2016-04-16 NOTE — Assessment & Plan Note (Addendum)
The patient has had ongoing left-sided abdominal pain. This is been extensively evaluated. Last colonoscopy 2014 with recommended 5 year repeat (2019) due to tubular adenoma polyps. CT is essentially unremarkable except for left-sided hernia. She is seen surgery who told her that they could remove the hernia but it could come back or there is a chance that is not the source of her pain. She did note a history of heme positive stools. I will check an I follow-up today, abdominal x-ray for excessive stool burden, CBC. Return for follow-up in 2 weeks.[name]

## 2016-04-16 NOTE — Patient Instructions (Signed)
1. Ring or stool test back to our office. 2. Have your blood tests done when you're able to. 3. Have your x-ray of your stomach done when you're able to. 4. Return for follow-up in 2 weeks.

## 2016-04-16 NOTE — Assessment & Plan Note (Signed)
The patient has a history of constipation. She is also having some lower abdominal discomfort at this time. Prescription medications have overshot resulted in diarrhea. She is currently using a constipation relief T from the health food store which she states causes bowel movements but seems like incomplete emptying. This point I'll check an iFob, abdominal x-ray for excessive stool burden. Return for follow-up in 2 weeks.

## 2016-04-16 NOTE — Progress Notes (Signed)
Referring Provider: Iona Beard, MD Primary Care Physician:  Maggie Font, MD Primary GI:  Dr. Gala Romney  Chief Complaint  Patient presents with  . Abdominal Pain    left upper side pain    HPI:   [name] is a 70 y.o. female who presents For follow-up on abdominal pain. The patient was last seen in our office 11/27/2015 for abdominal pain and constipation. Noted history of chronic abdominal pain. At her last visit it was deemed her vague left lower quadrant discomfort is likely due to and productive bowel movements. She was trialed on Linzess 72 g once a day and a CT of the abdomen was ordered. Recommended iFob testing at her next visit.  Phone call to our office noted Linzess 72 g to strong and she was provided samples of Amitiza 8 g twice a day. Called back to say Amitiza working "too well" and she was put on Linzess 72 g every other day. CT was ordered due to persistent pain.  CT was completed on 12/22/2015 and noted common bile duct borderline enlarged status post cholecystectomy, pancreas normal, spleen normal, left kidney surgically absent, stomach and bowel without evidence of obstruction, inflammatory process, or abnormal fluid collections. Noted mild left colonic diverticulosis without evidence of diverticulitis. Noted fat-containing left lateral abdominal wall hernia with wide neck and no evidence of strangulation at the surgical site from prior left nephrectomy.  MRI of thoracic spine completed 02/28/2016 noted chronic thoracic scoliosis with multilevel degenerative disc disease, chronic lumbar scoliosis with moderate spinal stenosis and multiple levels, compression of the left lateral recess of the thecal sac at L4-5. MRI of the lumbar spine noted spondylosis cyanosis due to severe facet arthritis, minimal bulging disc, chronic disc space narrowing.  Today she states she's doing ok. She uses health food store tea for her constipation, which is helping "fairly well." Rx  constipation medications have all resulted in diarrhea. Her abdominal pain is persistent and is left-sided. Pain is constant low level with exacerbations of worsening pain and described as crampy/spasming. Pain is "definitely" worse with movement. She saw a Psychologist, sport and exercise who stated he could repair the hernia but that it could come back or it could potentially not fix her pain. Denies N/V, hematochezia, melena. Does have history of GERD which is well controlled with Omeprazole. Has had "stomach ache" for the past 2 weeks described as crampy and in her lower abdomen. Denies chest pain, dyspnea, dizziness, lightheadedness, syncope, near syncope. Denies any other upper or lower GI symptoms.  Past Medical History:  Diagnosis Date  . Anxiety   . Arthritis   . Bronchiolitis    acute  . Chronic back pain   . Depression   . Diverticulosis   . GERD (gastroesophageal reflux disease)    negative H pylori stool Ag  . High cholesterol   . HTN (hypertension)   . Hyperplastic colon polyp 11/08/08  . IBS (irritable bowel syndrome)   . Tubular adenoma of colon   . Umbilical hernia   . Wears glasses     Past Surgical History:  Procedure Laterality Date  . ABDOMINAL HYSTERECTOMY  1985  . APPENDECTOMY    . BREAST SURGERY  1989   both sides  . CARPAL TUNNEL RELEASE Right 08/23/2013   Procedure: RIGHT ENDOSCOPIC CARPAL  TUNNEL RELEASE;  Surgeon: Jolyn Nap, MD;  Location: [place];  Service: Orthopedics;  Laterality: Right;  . [place] SURGERY  2007  . CHOLECYSTECTOMY  1980  . COLONOSCOPY  11/08/08   Dr. Gala Romney- hemorrhoids, pancolonic diverticula,,hyperplastic polyp  . COLONOSCOPY   08/05/2005   [id number] rectum/Diminutive polyp at 7 cm/Pancolonic diverticula/benign-appearing ulcer in the mid descending colon  . COLONOSCOPY N/A 03/30/2013   [id number] anal canal hemorrhoids. Single colonic polyp-removed as found above. Colonic diverticulosis.  . DORSAL COMPARTMENT RELEASE Left  12/12/2014   Procedure: LEFT WRIST RELEASE DORSAL COMPARTMENT (DEQUERVAIN);  Surgeon: Milly Jakob, MD;  Location: [place];  Service: Orthopedics;  Laterality: Left;  . ECTOPIC PREGNANCY SURGERY     bilat  . NEPHRECTOMY  1975   left-infection  . TRIGGER FINGER RELEASE Right 08/23/2013   Procedure: RIGHT INDEX FINGER TRIGGER RELEASE ;  Surgeon: Jolyn Nap, MD;  Location: [place];  Service: Orthopedics;  Laterality: Right;    Current Outpatient Prescriptions  Medication Sig Dispense Refill  . ALPRAZolam (XANAX) 0.5 MG tablet Take 0.5 mg by mouth daily as needed. For anxiety or sleep    . amLODipine-valsartan (EXFORGE) 5-160 MG per tablet Take 1 tablet by mouth daily.    [name] aspirin EC 81 MG tablet Take 81 mg by mouth every morning.    . furosemide (LASIX) 20 MG tablet Take 20 mg by mouth at bedtime.     [name] HYDROcodone-acetaminophen (NORCO) 5-325 MG per tablet Take 1-2 tablets by mouth every 4 (four) hours as needed. 30 tablet 0  . NON FORMULARY 1 packet. Smooth Move as needed for constipation    . omeprazole (PRILOSEC) 20 MG capsule Take 20 mg by mouth daily as needed. For acid reflux    . Pitavastatin Calcium (LIVALO) 2 MG TABS Take 1 tablet by mouth every 3 (three) days.     . Vitamin D, Ergocalciferol, (DRISDOL) 50000 units CAPS capsule Take 50,000 Units by mouth once a week.  11  . Fluticasone-Salmeterol (ADVAIR DISKUS) 250-50 MCG/DOSE AEPB Inhale 1 puff into the lungs 2 (two) times daily. (Patient not taking: Reported on 04/16/2016) 60 each 0   No current facility-administered medications for this visit.     Allergies as of 04/16/2016 - Review Complete 04/16/2016  Allergen Reaction Noted  . Morphine and related Hypertension 11/11/2013    Family History  Problem Relation Age of Onset  . Cancer Brother 60    gastric  . Heart attack Mother     Social History   Social History  . Marital status: Married    Spouse name: [name]  . Number  of children: 1  . Years of education: some coll.   Occupational History  . 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 Smoking status: Former Smoker    Packs/day: 0.25    Years: 30.00    Types: Cigarettes    Quit date: 08/19/2000  . Smokeless tobacco: Never Used     Comment: "smoked but not inhaled"  . Alcohol use No     Comment: maybe one a month wine  . Drug use: No  . Sexual activity: Yes    Birth control/ protection: Surgical   Other Topics Concern  . None   Social History Narrative   Consumes 2 cups of caffeine daily    Review of Systems: General: Negative for anorexia, weight loss, fever, chills, fatigue, weakness. Eyes: Negative for vision changes.  ENT: Negative for hoarseness, difficulty swallowing , nasal congestion. CV: Negative for chest pain, angina, palpitations, dyspnea on exertion, peripheral edema.  Respiratory: Negative for dyspnea at rest,  dyspnea on exertion, cough, sputum, wheezing.  GI: See history of present illness. GU:  Negative for dysuria, hematuria, urinary incontinence, urinary frequency, nocturnal urination.  MS: Negative for joint pain, low back pain.  Derm: Negative for rash or itching.  Neuro: Negative for weakness, abnormal sensation, seizure, frequent headaches, memory loss, confusion.  Psych: Negative for anxiety, depression, suicidal ideation, hallucinations.  Endo: Negative for unusual weight change.  Heme: Negative for bruising or bleeding. Allergy: Negative for rash or hives.   Physical Exam: BP 124/85   Pulse 75   Temp 98.2 F (36.8 C) (Oral)   Ht 5\' 7"  (1.702 m)   Wt 187 lb 3.2 oz (84.9 kg)   BMI 29.32 kg/m  General:   Alert and oriented. Pleasant and cooperative. Well-nourished and well-developed.  Head:  Normocephalic and atraumatic. Eyes:  Without icterus, sclera clear and conjunctiva pink.  Ears:  Normal auditory acuity. Mouth:  No deformity or lesions, oral mucosa pink.  Throat/Neck:   Supple, without mass or thyromegaly. Cardiovascular:  S1, S2 present without murmurs appreciated. Normal pulses noted. Extremities without clubbing or edema. Respiratory:  Clear to auscultation bilaterally. No wheezes, rales, or rhonchi. No distress.  Gastrointestinal:  +BS, soft, non-tender and non-distended. No HSM noted. No guarding or rebound. No masses appreciated.  Rectal:  Deferred  Musculoskalatal:  Symmetrical without gross deformities. Normal posture. Skin:  Intact without significant lesions or rashes. Neurologic:  Alert and oriented x4;  grossly normal neurologically. Psych:  Alert and cooperative. Normal mood and affect. Heme/Lymph/Immune: No significant cervical adenopathy. No excessive bruising noted.    04/16/2016 10:27 AM   Disclaimer: This note was dictated with voice recognition software. Similar sounding words can inadvertently be transcribed and may not be corrected upon review.

## 2016-04-17 ENCOUNTER — Ambulatory Visit (INDEPENDENT_AMBULATORY_CARE_PROVIDER_SITE_OTHER): Payer: Medicare Other

## 2016-04-17 DIAGNOSIS — R109 Unspecified abdominal pain: Secondary | ICD-10-CM | POA: Diagnosis not present

## 2016-04-17 LAB — CBC WITH DIFFERENTIAL/PLATELET
Basophils Absolute: 0 cells/uL (ref 0–200)
Basophils Relative: 0 %
Eosinophils Absolute: 59 cells/uL (ref 15–500)
Eosinophils Relative: 1 %
HCT: 37.1 % (ref 35.0–45.0)
Hemoglobin: 12.1 g/dL (ref 11.7–15.5)
Lymphocytes Relative: 43 %
Lymphs Abs: 2537 cells/uL (ref 850–3900)
MCH: 28.9 pg (ref 27.0–33.0)
MCHC: 32.6 g/dL (ref 32.0–36.0)
MCV: 88.5 fL (ref 80.0–100.0)
MPV: 11.6 fL (ref 7.5–12.5)
Monocytes Absolute: 472 cells/uL (ref 200–950)
Monocytes Relative: 8 %
Neutro Abs: 2832 cells/uL (ref 1500–7800)
Neutrophils Relative %: 48 %
Platelets: 289 10*3/uL (ref 140–400)
RBC: 4.19 MIL/uL (ref 3.80–5.10)
RDW: 13.5 % (ref 11.0–15.0)
WBC: 5.9 10*3/uL (ref 3.8–10.8)

## 2016-04-17 LAB — IFOBT (OCCULT BLOOD): IFOBT: POSITIVE

## 2016-04-18 NOTE — Progress Notes (Signed)
cc'ed to pcp °

## 2016-05-01 ENCOUNTER — Other Ambulatory Visit: Payer: Self-pay

## 2016-05-01 ENCOUNTER — Ambulatory Visit (INDEPENDENT_AMBULATORY_CARE_PROVIDER_SITE_OTHER): Payer: Medicare Other | Admitting: Gastroenterology

## 2016-05-01 ENCOUNTER — Encounter: Payer: Self-pay | Admitting: Gastroenterology

## 2016-05-01 VITALS — BP 133/83 | HR 85 | Temp 98.2°F | Ht 67.0 in | Wt 187.0 lb

## 2016-05-01 DIAGNOSIS — R195 Other fecal abnormalities: Secondary | ICD-10-CM

## 2016-05-01 DIAGNOSIS — R1012 Left upper quadrant pain: Secondary | ICD-10-CM

## 2016-05-01 MED ORDER — PEG-KCL-NACL-NASULF-NA ASC-C 100 G PO SOLR: 1.0000 | ORAL | 0 refills | Status: DC

## 2016-05-01 NOTE — Progress Notes (Signed)
Referring Provider: Iona Beard, MD Primary Care Physician:  Maggie Font, MD  Primary GI: Dr. Gala Romney   Chief Complaint  Patient presents with  . Abdominal Pain    less severe  . Constipation  . Diarrhea    HPI:   [name] is a 70 y.o. female presenting today with a history of GERD, constipation, chronic abdominal pain. Surveillance colonoscopy for history of adenomas due in 2019. Last seen May 2017 with LUQ/left-sided abdomen discomfort and started on bowel regimen, Ct was ordered showing left lateral abdominal wall hernia. Evaluated in past by Dr. Dalbert Batman but not felt to be cause of her symptoms. 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 Recently heme positive. Here to schedule colonoscopy.   Review of Dr. Darrel Hoover note: anatomy of her hernia did not correlate with pain in back and left flank, leg pain. Advised against hernia repair. Referred to a spine specialist and that if neurosurgery did not feel that pain was related to spine process, that Dr. Rosendo Gros at [facility] could consider hernia repair. She states that neurosurgery did not feel her abdominal pain was coming from the spine. She saw Dr. Rosendo Gros, who stated that hernia repair could be performed but may not help pain.   Had diarrhea a few days ago but now back to constipation. Uses "smooth move". Other prescriptive agents are "too strong". Smooth move works perfectly. She states that several years ago she had the similar pain, then had the colonoscopy and the pain went away.   Pain is not worsened with eating. Feels like spasms at times until you can get into a position that relieves it. Pain is nagging. No dysphagia, N/V. Appetite is fair.   Past Medical History:  Diagnosis Date  . Anxiety   . Arthritis   . Bronchiolitis    acute  . Chronic back pain   . Depression   . Diverticulosis   . GERD (gastroesophageal reflux  disease)    negative H pylori stool Ag  . High cholesterol   . HTN (hypertension)   . Hyperplastic colon polyp 11/08/08  . IBS (irritable bowel syndrome)   . Tubular adenoma of colon   . Umbilical hernia   . Wears glasses     Past Surgical History:  Procedure Laterality Date  . ABDOMINAL HYSTERECTOMY  1985  . APPENDECTOMY    . BREAST SURGERY  1989   both sides  . CARPAL TUNNEL RELEASE Right 08/23/2013   Procedure: RIGHT ENDOSCOPIC CARPAL  TUNNEL RELEASE;  Surgeon: Jolyn Nap, MD;  Location: [place];  Service: Orthopedics;  Laterality: Right;  . [place] SURGERY  2007  . CHOLECYSTECTOMY  1980  . COLONOSCOPY  11/08/08   Dr. Gala Romney- hemorrhoids, pancolonic diverticula,,hyperplastic polyp  . COLONOSCOPY   08/05/2005   QIW:LNLGXQ rectum/Diminutive polyp at 45 cm/Pancolonic diverticula/benign-appearing ulcer in the mid descending colon  . COLONOSCOPY N/A 03/30/2013   JJH:ERDEYCX anal canal hemorrhoids. Single colonic polyp-removed (tubular adenoma). Colonic diverticulosis.  . DORSAL COMPARTMENT RELEASE Left 12/12/2014   Procedure: LEFT WRIST RELEASE DORSAL COMPARTMENT (DEQUERVAIN);  Surgeon: Milly Jakob, MD;  Location: [place] [facility];  Service: Orthopedics;  Laterality: Left;  . ECTOPIC PREGNANCY SURGERY     bilat  . NEPHRECTOMY  1975   left-infection  . TRIGGER FINGER RELEASE Right 08/23/2013   Procedure: RIGHT INDEX FINGER TRIGGER RELEASE ;  Surgeon: Jolyn Nap,  MD;  Location: [facility];  Service: Orthopedics;  Laterality: Right;    Current Outpatient Prescriptions  Medication Sig Dispense Refill  . ALPRAZolam (XANAX) 0.5 MG tablet Take 0.5 mg by mouth daily as needed. For anxiety or sleep    . amLODipine-valsartan (EXFORGE) 5-160 MG per tablet Take 1 tablet by mouth daily.    [name] aspirin EC 81 MG tablet Take 81 mg by mouth every morning.    . furosemide (LASIX) 20 MG tablet Take 20 mg by mouth at bedtime.     . NON FORMULARY  1 packet. Smooth Move as needed for constipation    . omeprazole (PRILOSEC) 20 MG capsule Take 20 mg by mouth daily as needed. For acid reflux    . Pitavastatin Calcium (LIVALO) 2 MG TABS Take 1 tablet by mouth every 3 (three) days.     . Vitamin D, Ergocalciferol, (DRISDOL) 50000 units CAPS capsule Take 50,000 Units by mouth once a week.  11  . peg 3350 powder (MOVIPREP) 100 g SOLR Take 1 kit (200 g total) by mouth as directed. 1 kit 0   No current facility-administered medications for this visit.     Allergies as of 05/01/2016 - Review Complete 05/01/2016  Allergen Reaction Noted  . Morphine and related Hypertension 11/11/2013    Family History  Problem Relation Age of Onset  . Cancer Brother 60    gastric  . Heart attack Mother   . Colon cancer Neg Hx   . Colon polyps Neg Hx     Social History   Social History  . Marital status: Married    Spouse name: [name]  . Number of children: 1  . Years of education: some coll.   Occupational History  . 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 Smoking status: Former Smoker    Packs/day: 0.25    Years: 30.00    Types: Cigarettes    Quit date: 08/19/2000  . Smokeless tobacco: Never Used     Comment: "smoked but not inhaled"  . Alcohol use No  . Drug use: No  . Sexual activity: Yes    Birth control/ protection: Surgical   Other Topics Concern  . None   Social History Narrative   Consumes 2 cups of caffeine daily    Review of Systems: As mentioned in HPI   Physical Exam: BP 133/83   Pulse 85   Temp 98.2 F (36.8 C) (Oral)   Ht _0  (1.702 m)   Wt 187 lb (84.8 kg)   BMI 29.29 kg/m  General:   Alert and oriented. No distress noted. Pleasant and cooperative.  Head:  Normocephalic and atraumatic. Eyes:  Conjuctiva clear without scleral icterus. Mouth:  Oral mucosa pink and moist. Good dentition. No lesions. Heart:  S1, S2 present without murmurs, rubs, or gallops. Regular rate and  rhythm. Abdomen:  +BS, soft, mild TTP along LUQ/left-abdomen, close to area of prior nephrectomy scar and non-distended. No rebound or guarding. Unable to significantly appreciate abdominal hernia  Msk:  Symmetrical without gross deformities. Normal posture. Extremities:  Without edema. Neurologic:  Alert and  oriented x4;  grossly normal neurologically. Psych:  Alert and cooperative. Normal mood and affect.  Lab Results  Component Value Date   WBC 5.9 04/16/2016   HGB 12.1 04/16/2016   HCT 37.1 04/16/2016   MCV 88.5 04/16/2016   PLT 289 04/16/2016

## 2016-05-01 NOTE — Patient Instructions (Signed)
We have scheduled you for a colonoscopy with Dr. Rourk in the near future.  Further recommendations to follow!   

## 2016-05-03 ENCOUNTER — Encounter: Payer: Self-pay | Admitting: Gastroenterology

## 2016-05-03 NOTE — Assessment & Plan Note (Signed)
70 year old female with recently heme positive stool, normal CBC. No overt GI bleeding. Last colonoscopy in 2014 with tubular adenoma, friable anal canal hemorrhoids, colonic diverticulosis. Long-standing left-sided LUQ pain persists, which has been thoroughly evaluated by our practice and found to have left lateral abdominal wall hernia. Dr. Dalbert Batman has evaluated and referred to neurosurgery, who did not feel back issues were contributing to abdominal pain. Hernia may or may not be playing a role in symptoms. Question adhesive disease as well, as patient is s/p left nephrectomy. Colonoscopy now to be performed due to heme positive stool. Will refer to Dr. Rosendo Gros at [facility] as per plan if colonoscopy unrevealing.   Proceed with TCS with Dr. Gala Romney in near future: the risks, benefits, and alternatives have been discussed with the patient in detail. The patient states understanding and desires to proceed. Phenergan 25 mg IV on call

## 2016-05-03 NOTE — Assessment & Plan Note (Addendum)
Extensive evaluation. No prior EGD but she has no pain with eating, dysphagia, N/V or any other symptoms that would prompt upper GI evaluation. Pain appears to coincide with area of prior nephrectomy. As side note, brother with history of gastric cancer. CT recently on file from June. CBC normal. Colonoscopy as planned due to heme positive stool, with referral back to General Surgery thereafter. EGD would likely be low yield

## 2016-05-06 NOTE — Progress Notes (Signed)
CC'D TO PCP °

## 2016-05-08 ENCOUNTER — Telehealth: Payer: Self-pay

## 2016-05-08 NOTE — Telephone Encounter (Signed)
Pt's time was moved up from 1045 to 1000. She is aware of time change

## 2016-05-09 ENCOUNTER — Encounter (HOSPITAL_COMMUNITY)
Admission: RE | Disposition: A | Discharge status: Home / Self Care | Payer: Self-pay | Source: Ambulatory Visit | Attending: Internal Medicine

## 2016-05-09 ENCOUNTER — Encounter (HOSPITAL_COMMUNITY): Payer: Self-pay | Admitting: *Deleted

## 2016-05-09 ENCOUNTER — Ambulatory Visit (HOSPITAL_COMMUNITY)
Admission: RE | Admit: 2016-05-09 | Discharge: 2016-05-09 | Disposition: A | Discharge status: Home / Self Care | Payer: Medicare Other | Source: Ambulatory Visit | Attending: Internal Medicine | Admitting: Internal Medicine

## 2016-05-09 DIAGNOSIS — Z800 Family history of malignant neoplasm of digestive organs: Secondary | ICD-10-CM | POA: Insufficient documentation

## 2016-05-09 DIAGNOSIS — K633 Ulcer of intestine: Secondary | ICD-10-CM | POA: Diagnosis not present

## 2016-05-09 DIAGNOSIS — R109 Unspecified abdominal pain: Secondary | ICD-10-CM | POA: Insufficient documentation

## 2016-05-09 DIAGNOSIS — K589 Irritable bowel syndrome without diarrhea: Secondary | ICD-10-CM | POA: Diagnosis not present

## 2016-05-09 DIAGNOSIS — K219 Gastro-esophageal reflux disease without esophagitis: Secondary | ICD-10-CM | POA: Insufficient documentation

## 2016-05-09 DIAGNOSIS — G8929 Other chronic pain: Secondary | ICD-10-CM | POA: Insufficient documentation

## 2016-05-09 DIAGNOSIS — Z8719 Personal history of other diseases of the digestive system: Secondary | ICD-10-CM | POA: Insufficient documentation

## 2016-05-09 DIAGNOSIS — Z9889 Other specified postprocedural states: Secondary | ICD-10-CM | POA: Insufficient documentation

## 2016-05-09 DIAGNOSIS — M549 Dorsalgia, unspecified: Secondary | ICD-10-CM | POA: Diagnosis not present

## 2016-05-09 DIAGNOSIS — M1990 Unspecified osteoarthritis, unspecified site: Secondary | ICD-10-CM | POA: Insufficient documentation

## 2016-05-09 DIAGNOSIS — R197 Diarrhea, unspecified: Secondary | ICD-10-CM | POA: Diagnosis present

## 2016-05-09 DIAGNOSIS — Z7982 Long term (current) use of aspirin: Secondary | ICD-10-CM | POA: Insufficient documentation

## 2016-05-09 DIAGNOSIS — F419 Anxiety disorder, unspecified: Secondary | ICD-10-CM | POA: Insufficient documentation

## 2016-05-09 DIAGNOSIS — E7800 Pure hypercholesterolemia, unspecified: Secondary | ICD-10-CM | POA: Insufficient documentation

## 2016-05-09 DIAGNOSIS — I10 Essential (primary) hypertension: Secondary | ICD-10-CM | POA: Diagnosis not present

## 2016-05-09 DIAGNOSIS — Z86010 Personal history of colonic polyps: Secondary | ICD-10-CM | POA: Insufficient documentation

## 2016-05-09 DIAGNOSIS — Z9049 Acquired absence of other specified parts of digestive tract: Secondary | ICD-10-CM | POA: Insufficient documentation

## 2016-05-09 DIAGNOSIS — F329 Major depressive disorder, single episode, unspecified: Secondary | ICD-10-CM | POA: Diagnosis not present

## 2016-05-09 DIAGNOSIS — Z87891 Personal history of nicotine dependence: Secondary | ICD-10-CM | POA: Diagnosis not present

## 2016-05-09 DIAGNOSIS — Z90710 Acquired absence of both cervix and uterus: Secondary | ICD-10-CM | POA: Diagnosis not present

## 2016-05-09 DIAGNOSIS — Z905 Acquired absence of kidney: Secondary | ICD-10-CM | POA: Insufficient documentation

## 2016-05-09 DIAGNOSIS — Z8249 Family history of ischemic heart disease and other diseases of the circulatory system: Secondary | ICD-10-CM | POA: Insufficient documentation

## 2016-05-09 DIAGNOSIS — K5730 Diverticulosis of large intestine without perforation or abscess without bleeding: Secondary | ICD-10-CM | POA: Insufficient documentation

## 2016-05-09 DIAGNOSIS — R195 Other fecal abnormalities: Secondary | ICD-10-CM

## 2016-05-09 DIAGNOSIS — Z79899 Other long term (current) drug therapy: Secondary | ICD-10-CM | POA: Insufficient documentation

## 2016-05-09 HISTORY — PX: BIOPSY: SHX5522

## 2016-05-09 HISTORY — PX: COLONOSCOPY: SHX5424

## 2016-05-09 SURGERY — COLONOSCOPY
Anesthesia: Moderate Sedation

## 2016-05-09 MED ORDER — SODIUM CHLORIDE 0.9 % IV SOLN
INTRAVENOUS | Status: DC
  Administered 2016-05-09: 1000 mL via INTRAVENOUS

## 2016-05-09 MED ORDER — MIDAZOLAM HCL 5 MG/5ML IJ SOLN
INTRAMUSCULAR | Status: AC
  Filled 2016-05-09: qty 10

## 2016-05-09 MED ORDER — MEPERIDINE HCL 100 MG/ML IJ SOLN
INTRAMUSCULAR | Status: AC
  Filled 2016-05-09: qty 2

## 2016-05-09 MED ORDER — MEPERIDINE HCL 100 MG/ML IJ SOLN
INTRAMUSCULAR | Status: DC | PRN
  Administered 2016-05-09 (×3): 25 mg via INTRAVENOUS

## 2016-05-09 MED ORDER — PROMETHAZINE HCL 25 MG/ML IJ SOLN
INTRAMUSCULAR | Status: AC
  Filled 2016-05-09: qty 1

## 2016-05-09 MED ORDER — ONDANSETRON HCL 4 MG/2ML IJ SOLN
INTRAMUSCULAR | Status: DC | PRN
  Administered 2016-05-09: 4 mg via INTRAVENOUS

## 2016-05-09 MED ORDER — MIDAZOLAM HCL 5 MG/5ML IJ SOLN
INTRAMUSCULAR | Status: DC | PRN
  Administered 2016-05-09 (×4): 1 mg via INTRAVENOUS

## 2016-05-09 MED ORDER — SIMETHICONE 40 MG/0.6ML PO SUSP
ORAL | Status: DC | PRN
  Administered 2016-05-09: 2.5 mL

## 2016-05-09 MED ORDER — SODIUM CHLORIDE 0.9% FLUSH
INTRAVENOUS | Status: AC
  Filled 2016-05-09: qty 10

## 2016-05-09 MED ORDER — ONDANSETRON HCL 4 MG/2ML IJ SOLN
INTRAMUSCULAR | Status: AC
  Filled 2016-05-09: qty 2

## 2016-05-09 MED ORDER — PROMETHAZINE HCL 25 MG/ML IJ SOLN
25.0000 mg | Freq: Once | INTRAMUSCULAR | Status: AC
  Administered 2016-05-09: 12.5 mg via INTRAVENOUS

## 2016-05-09 NOTE — Op Note (Signed)
[facility] Patient Name: [name] Procedure Date: 05/09/2016 10:03 AM MRN: [id number] Date of Birth: Sep 17, 1945 Attending MD: Norvel Richards , MD CSN: [id number] Age: 70 Admit Type: Outpatient Procedure:                Colonoscopy Indications:              High risk colon cancer surveillance: Personal                            history of colonic polyps Providers:                Norvel Richards, MD, Lurline Del, RN, Purcell Nails.                            [place], Merchant navy officer Referring MD:              Medicines:                Midazolam 4 mg IV, Meperidine 75 mg IV Complications:            No immediate complications. Estimated Blood Loss:     Estimated blood loss: minimal Procedure:                Pre-Anesthesia Assessment:                           - Prior to the procedure, a History and Physical                            was performed, and patient medications and                            allergies were reviewed. The patient's tolerance of                            previous anesthesia was also reviewed. The risks                            and benefits of the procedure and the sedation                            options and risks were discussed with the patient.                            All questions were answered, and informed consent                            was obtained. Prior Anticoagulants: The patient has                            taken no previous anticoagulant or antiplatelet                            agents. ASA Grade Assessment: III - A patient with  severe systemic disease. After reviewing the risks                            and benefits, the patient was deemed in                            satisfactory condition to undergo the procedure.                           After obtaining informed consent, the colonoscope                            was passed under direct vision. Throughout the   procedure, the patient's blood pressure, pulse, and                            oxygen saturations were monitored continuously. The                            [id number]) scope was introduced through                            the anus and advanced to the 5 cm into the ileum.                            The terminal ileum, ileocecal valve, appendiceal                            orifice, and rectum were photographed. The terminal                            ileum, ileocecal valve, appendiceal orifice, and                            rectum were photographed. The entire colon was                            visualized. The quality of the bowel preparation                            was adequate. Scope In: 10:16:22 AM Scope Out: 10:35:17 AM Scope Withdrawal Time: 0 hours 14 minutes 7 seconds  Total Procedure Duration: 0 hours 18 minutes 55 seconds  Findings:      The perianal and digital rectal examinations were normal.      Scattered diverticula were found in the entire colon.      Ulcerated mucosa were present at the ileocecal valve. Single 10 mm ulcer       on the distal side of the ileocecal valve. Appeared to be       benign.Biopsies taken.      The exam was otherwise without abnormality on direct and retroflexion       views. Greasy colonic effluent throughout the colon made visualization       of the finer mucosal detail difficult. Impression:               -  Diverticulosis in the entire examined colon.                           - Mucosal ulceration. Biopsied.                           - The examination was otherwise normal on direct                            and retroflexion views. Moderate Sedation:      Moderate (conscious) sedation was administered by the endoscopy nurse       and supervised by the endoscopist. The following parameters were       monitored: oxygen saturation, heart rate, blood pressure, respiratory       rate, EKG, adequacy of pulmonary ventilation, and  response to care.       Total physician intraservice time was 32 minutes. Recommendation:           - Patient has a contact number available for                            emergencies. The signs and symptoms of potential                            delayed complications were discussed with the                            patient. Return to normal activities tomorrow.                            Written discharge instructions were provided to the                            patient.                           - Resume previous diet.                           - Continue present medications.                           - Repeat colonoscopy date to be determined after                            pending pathology results are reviewed for                            surveillance based on pathology results.                           - Return to GI office in 3 months. Procedure Code(s):        --- Professional ---                           [phone], Colonoscopy, flexible; with biopsy, single  or multiple                           99152, Moderate sedation services provided by the                            same physician or other qualified health care                            professional performing the diagnostic or                            therapeutic service that the sedation supports,                            requiring the presence of an independent trained                            observer to assist in the monitoring of the                            patient's level of consciousness and physiological                            status; initial 15 minutes of intraservice time,                            patient age 44 years or older                           [phone], Moderate sedation services; each additional                            15 minutes intraservice time Diagnosis Code(s):        --- Professional ---                           Z86.010, Personal history of colonic  polyps                           K63.3, Ulcer of intestine                           K57.30, Diverticulosis of large intestine without                            perforation or abscess without bleeding CPT copyright 2016 American Medical Association. All rights reserved. The codes documented in this report are preliminary and upon coder review may  be revised to meet current compliance requirements. Cristopher Estimable. Reighlynn Swiney, MD Norvel Richards, MD 05/09/2016 10:46:18 AM This report has been signed electronically. Number of Addenda: 0

## 2016-05-09 NOTE — Interval H&P Note (Signed)
History and Physical Interval Note:  05/09/2016 10:00 AM  [name]  has presented today for surgery, with the diagnosis of heme positive  The various methods of treatment have been discussed with the patient and family. After consideration of risks, benefits and other options for treatment, the patient has consented to  Procedure(s) with comments: COLONOSCOPY (N/A) - 1045 as a surgical intervention .  The patient's history has been reviewed, patient examined, no change in status, stable for surgery.  I have reviewed the patient's chart and labs.  Questions were answered to the patient's satisfaction.    No change. Surveillance colonoscopy per plan. The risks, benefits, limitations, alternatives and imponderables have been reviewed with the patient. Questions have been answered. All parties are agreeable.  Manus Rudd

## 2016-05-09 NOTE — H&P (View-Only) (Signed)
Referring Provider: Iona Beard, MD Primary Care Physician:  Maggie Font, MD  Primary GI: Dr. Gala Romney   Chief Complaint  Patient presents with  . Abdominal Pain    less severe  . Constipation  . Diarrhea    HPI:   [name] is a 70 y.o. female presenting today with a history of GERD, constipation, chronic abdominal pain. Surveillance colonoscopy for history of adenomas due in 2019. Last seen May 2017 with LUQ/left-sided abdomen discomfort and started on bowel regimen, Ct was ordered showing left lateral abdominal wall hernia. Evaluated in past by Dr. Dalbert Batman but not felt to be cause of her symptoms. 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 Recently heme positive. Here to schedule colonoscopy.   Review of Dr. Darrel Hoover note: anatomy of her hernia did not correlate with pain in back and left flank, leg pain. Advised against hernia repair. Referred to a spine specialist and that if neurosurgery did not feel that pain was related to spine process, that Dr. Rosendo Gros at [facility] could consider hernia repair. She states that neurosurgery did not feel her abdominal pain was coming from the spine. She saw Dr. Rosendo Gros, who stated that hernia repair could be performed but may not help pain.   Had diarrhea a few days ago but now back to constipation. Uses "smooth move". Other prescriptive agents are "too strong". Smooth move works perfectly. She states that several years ago she had the similar pain, then had the colonoscopy and the pain went away.   Pain is not worsened with eating. Feels like spasms at times until you can get into a position that relieves it. Pain is nagging. No dysphagia, N/V. Appetite is fair.   Past Medical History:  Diagnosis Date  . Anxiety   . Arthritis   . Bronchiolitis    acute  . Chronic back pain   . Depression   . Diverticulosis   . GERD (gastroesophageal reflux  disease)    negative H pylori stool Ag  . High cholesterol   . HTN (hypertension)   . Hyperplastic colon polyp 11/08/08  . IBS (irritable bowel syndrome)   . Tubular adenoma of colon   . Umbilical hernia   . Wears glasses     Past Surgical History:  Procedure Laterality Date  . ABDOMINAL HYSTERECTOMY  1985  . APPENDECTOMY    . BREAST SURGERY  1989   both sides  . CARPAL TUNNEL RELEASE Right 08/23/2013   Procedure: RIGHT ENDOSCOPIC CARPAL  TUNNEL RELEASE;  Surgeon: Jolyn Nap, MD;  Location: [place];  Service: Orthopedics;  Laterality: Right;  . [place] SURGERY  2007  . CHOLECYSTECTOMY  1980  . COLONOSCOPY  11/08/08   Dr. Gala Romney- hemorrhoids, pancolonic diverticula,,hyperplastic polyp  . COLONOSCOPY   08/05/2005   QIW:LNLGXQ rectum/Diminutive polyp at 45 cm/Pancolonic diverticula/benign-appearing ulcer in the mid descending colon  . COLONOSCOPY N/A 03/30/2013   JJH:ERDEYCX anal canal hemorrhoids. Single colonic polyp-removed (tubular adenoma). Colonic diverticulosis.  . DORSAL COMPARTMENT RELEASE Left 12/12/2014   Procedure: LEFT WRIST RELEASE DORSAL COMPARTMENT (DEQUERVAIN);  Surgeon: Milly Jakob, MD;  Location: [place] [facility];  Service: Orthopedics;  Laterality: Left;  . ECTOPIC PREGNANCY SURGERY     bilat  . NEPHRECTOMY  1975   left-infection  . TRIGGER FINGER RELEASE Right 08/23/2013   Procedure: RIGHT INDEX FINGER TRIGGER RELEASE ;  Surgeon: Jolyn Nap,  MD;  Location: [facility];  Service: Orthopedics;  Laterality: Right;    Current Outpatient Prescriptions  Medication Sig Dispense Refill  . ALPRAZolam (XANAX) 0.5 MG tablet Take 0.5 mg by mouth daily as needed. For anxiety or sleep    . amLODipine-valsartan (EXFORGE) 5-160 MG per tablet Take 1 tablet by mouth daily.    [name] aspirin EC 81 MG tablet Take 81 mg by mouth every morning.    . furosemide (LASIX) 20 MG tablet Take 20 mg by mouth at bedtime.     . NON FORMULARY  1 packet. Smooth Move as needed for constipation    . omeprazole (PRILOSEC) 20 MG capsule Take 20 mg by mouth daily as needed. For acid reflux    . Pitavastatin Calcium (LIVALO) 2 MG TABS Take 1 tablet by mouth every 3 (three) days.     . Vitamin D, Ergocalciferol, (DRISDOL) 50000 units CAPS capsule Take 50,000 Units by mouth once a week.  11  . peg 3350 powder (MOVIPREP) 100 g SOLR Take 1 kit (200 g total) by mouth as directed. 1 kit 0   No current facility-administered medications for this visit.     Allergies as of 05/01/2016 - Review Complete 05/01/2016  Allergen Reaction Noted  . Morphine and related Hypertension 11/11/2013    Family History  Problem Relation Age of Onset  . Cancer Brother 60    gastric  . Heart attack Mother   . Colon cancer Neg Hx   . Colon polyps Neg Hx     Social History   Social History  . Marital status: Married    Spouse name: [name]  . Number of children: 1  . Years of education: some coll.   Occupational History  . 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 Smoking status: Former Smoker    Packs/day: 0.25    Years: 30.00    Types: Cigarettes    Quit date: 08/19/2000  . Smokeless tobacco: Never Used     Comment: "smoked but not inhaled"  . Alcohol use No  . Drug use: No  . Sexual activity: Yes    Birth control/ protection: Surgical   Other Topics Concern  . None   Social History Narrative   Consumes 2 cups of caffeine daily    Review of Systems: As mentioned in HPI   Physical Exam: BP 133/83   Pulse 85   Temp 98.2 F (36.8 C) (Oral)   Ht _0  (1.702 m)   Wt 187 lb (84.8 kg)   BMI 29.29 kg/m  General:   Alert and oriented. No distress noted. Pleasant and cooperative.  Head:  Normocephalic and atraumatic. Eyes:  Conjuctiva clear without scleral icterus. Mouth:  Oral mucosa pink and moist. Good dentition. No lesions. Heart:  S1, S2 present without murmurs, rubs, or gallops. Regular rate and  rhythm. Abdomen:  +BS, soft, mild TTP along LUQ/left-abdomen, close to area of prior nephrectomy scar and non-distended. No rebound or guarding. Unable to significantly appreciate abdominal hernia  Msk:  Symmetrical without gross deformities. Normal posture. Extremities:  Without edema. Neurologic:  Alert and  oriented x4;  grossly normal neurologically. Psych:  Alert and cooperative. Normal mood and affect.  Lab Results  Component Value Date   WBC 5.9 04/16/2016   HGB 12.1 04/16/2016   HCT 37.1 04/16/2016   MCV 88.5 04/16/2016   PLT 289 04/16/2016

## 2016-05-09 NOTE — Discharge Instructions (Signed)
Colonoscopy Discharge Instructions  Read the instructions outlined below and refer to this sheet in the next few weeks. These discharge instructions provide you with general information on caring for yourself after you leave the hospital. Your doctor may also give you specific instructions. While your treatment has been planned according to the most current medical practices available, unavoidable complications occasionally occur. If you have any problems or questions after discharge, call Dr. Gala Romney at [phone]. ACTIVITY  You may resume your regular activity, but move at a slower pace for the next 24 hours.   Take frequent rest periods for the next 24 hours.   Walking will help get rid of the air and reduce the bloated feeling in your belly (abdomen).   No driving for 24 hours (because of the medicine (anesthesia) used during the test).    Do not sign any important legal documents or operate any machinery for 24 hours (because of the anesthesia used during the test).  NUTRITION  Drink plenty of fluids.   You may resume your normal diet as instructed by your doctor.   Begin with a light meal and progress to your normal diet. Heavy or fried foods are harder to digest and may make you feel sick to your stomach (nauseated).   Avoid alcoholic beverages for 24 hours or as instructed.  MEDICATIONS  You may resume your normal medications unless your doctor tells you otherwise.  WHAT YOU CAN EXPECT TODAY  Some feelings of bloating in the abdomen.   Passage of more gas than usual.   Spotting of blood in your stool or on the toilet paper.  IF YOU HAD POLYPS REMOVED DURING THE COLONOSCOPY:  No aspirin products for 7 days or as instructed.   No alcohol for 7 days or as instructed.   Eat a soft diet for the next 24 hours.  FINDING OUT THE RESULTS OF YOUR TEST Not all test results are available during your visit. If your test results are not back during the visit, make an appointment  with your caregiver to find out the results. Do not assume everything is normal if you have not heard from your caregiver or the medical facility. It is important for you to follow up on all of your test results.  SEEK IMMEDIATE MEDICAL ATTENTION IF:  You have more than a spotting of blood in your stool.   Your belly is swollen (abdominal distention).   You are nauseated or vomiting.   You have a temperature over 101.   You have abdominal pain or discomfort that is severe or gets worse throughout the day.     Colonic diverticulosis information provided  Further recommendations to follow pending review of pathology report      Diverticulosis Diverticulosis is the condition that develops when small pouches (diverticula) form in the wall of your colon. Your colon, or large intestine, is where water is absorbed and stool is formed. The pouches form when the inside layer of your colon pushes through weak spots in the outer layers of your colon. CAUSES  No one knows exactly what causes diverticulosis. RISK FACTORS  Being older than 26. Your risk for this condition increases with age. Diverticulosis is rare in people younger than 40 years. By age 64, almost everyone has it.  Eating a low-fiber diet.  Being frequently constipated.  Being overweight.  Not getting enough exercise.  Smoking.  Taking over-the-counter pain medicines, like aspirin and ibuprofen. SYMPTOMS  Most people with diverticulosis do not have  symptoms. DIAGNOSIS  Because diverticulosis often has no symptoms, health care providers often discover the condition during an exam for other colon problems. In many cases, a health care provider will diagnose diverticulosis while using a flexible scope to examine the colon (colonoscopy). TREATMENT  If you have never developed an infection related to diverticulosis, you may not need treatment. If you have had an infection before, treatment may include:  Eating more  fruits, vegetables, and grains.  Taking a fiber supplement.  Taking a live bacteria supplement (probiotic).  Taking medicine to relax your colon. HOME CARE INSTRUCTIONS   Drink at least 6-8 glasses of water each day to prevent constipation.  Try not to strain when you have a bowel movement.  Keep all follow-up appointments. If you have had an infection before:  Increase the fiber in your diet as directed by your health care provider or dietitian.  Take a dietary fiber supplement if your health care provider approves.  Only take medicines as directed by your health care provider. SEEK MEDICAL CARE IF:   You have abdominal pain.  You have bloating.  You have cramps.  You have not gone to the bathroom in 3 days. SEEK IMMEDIATE MEDICAL CARE IF:   Your pain gets worse.  Yourbloating becomes very bad.  You have a fever or chills, and your symptoms suddenly get worse.  You begin vomiting.  You have bowel movements that are bloody or black. MAKE SURE YOU:  Understand these instructions.  Will watch your condition.  Will get help right away if you are not doing well or get worse.   This information is not intended to replace advice given to you by your health care provider. Make sure you discuss any questions you have with your health care provider.   Document Released: 03/21/2004 Document Revised: 06/29/2013 Document Reviewed: 05/19/2013 Elsevier Interactive Patient Education [employer].

## 2016-05-15 ENCOUNTER — Encounter: Payer: Self-pay | Admitting: Internal Medicine

## 2016-05-16 ENCOUNTER — Encounter (HOSPITAL_COMMUNITY): Payer: Self-pay | Admitting: Internal Medicine

## 2016-05-21 ENCOUNTER — Telehealth: Payer: Self-pay | Admitting: Internal Medicine

## 2016-05-21 NOTE — Telephone Encounter (Signed)
[phone] or [phone]  Please call patient about her endo results, she has questions about which medications could have caused her ulcers

## 2016-05-22 NOTE — Telephone Encounter (Signed)
Tried to call pt- NA- LMOM 

## 2016-05-23 NOTE — Telephone Encounter (Signed)
I spoke with the pt and answered all her questions.

## 2016-06-17 ENCOUNTER — Ambulatory Visit: Payer: Medicare Other | Admitting: Pulmonary Disease

## 2017-02-05 HISTORY — PX: FOOT SURGERY: SHX648

## 2017-04-07 ENCOUNTER — Other Ambulatory Visit (HOSPITAL_COMMUNITY): Payer: Self-pay | Admitting: Internal Medicine

## 2017-04-07 DIAGNOSIS — Z1231 Encounter for screening mammogram for malignant neoplasm of breast: Secondary | ICD-10-CM

## 2017-04-09 IMAGING — CT CT ABD-PELV W/ CM
2 of 5 series · 16 of 46 positions shown, 18 images · IV contrast (iopamidol)
Comparison: CT of the abdomen pelvis 04/05/2010

CLINICAL DATA: Left-sided abdominal pain for 3 weeks.

EXAM:
CT ABDOMEN AND PELVIS WITH CONTRAST
TECHNIQUE: Multidetector CT imaging of the abdomen and pelvis was performed
using the standard protocol following bolus administration of
intravenous contrast.
CONTRAST:  100mL [id number] IOPAMIDOL ([id number]) INJECTION 61%

[Series 2: routine abd pel with · axial · 0.64mm/px · z∈[-446,-56]mm · 13 of 88 slices shown, 15 images]
[im 5/88  soft-tissue]
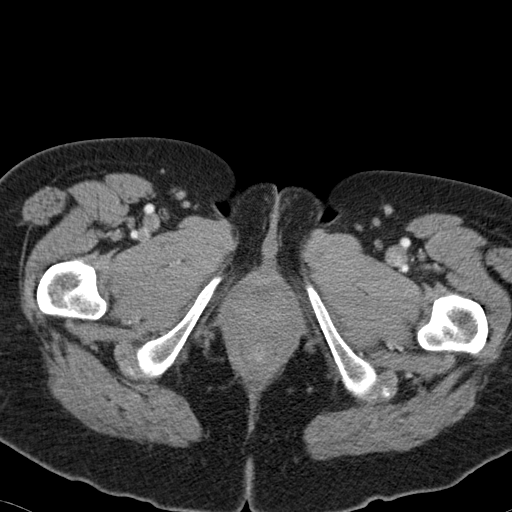
[im 5/88  bone]
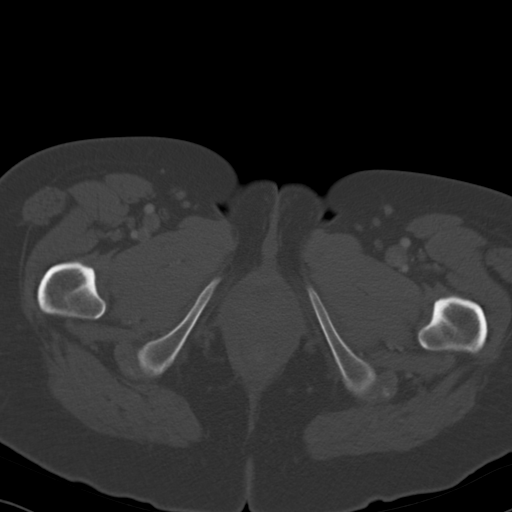
[im 10/88  soft-tissue]
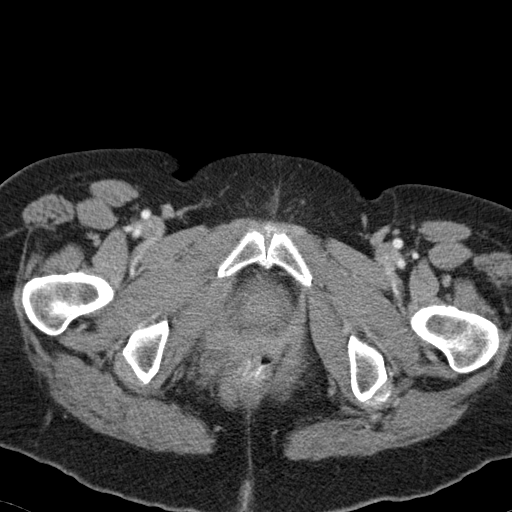
[im 20/88  soft-tissue]
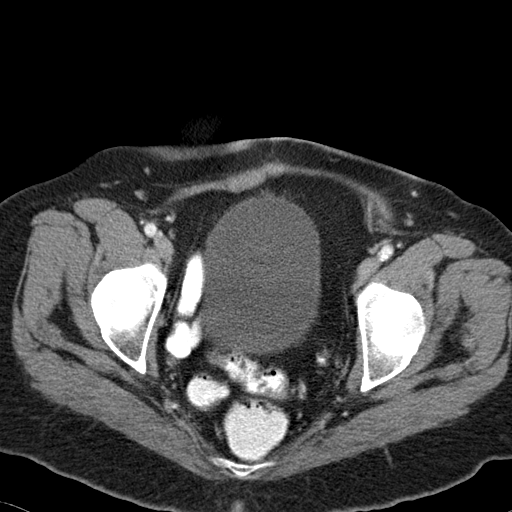
[im 25/88  soft-tissue]
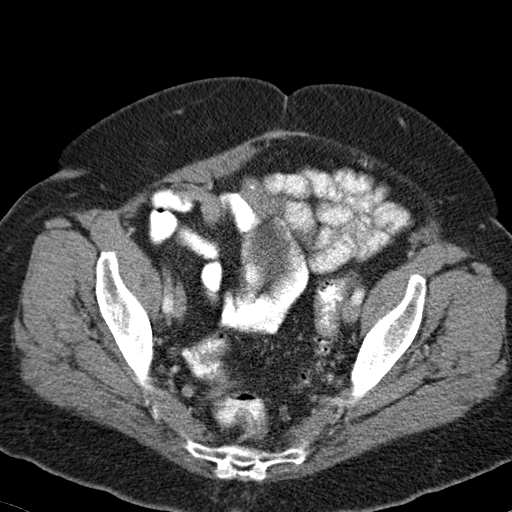
[im 30/88  soft-tissue]
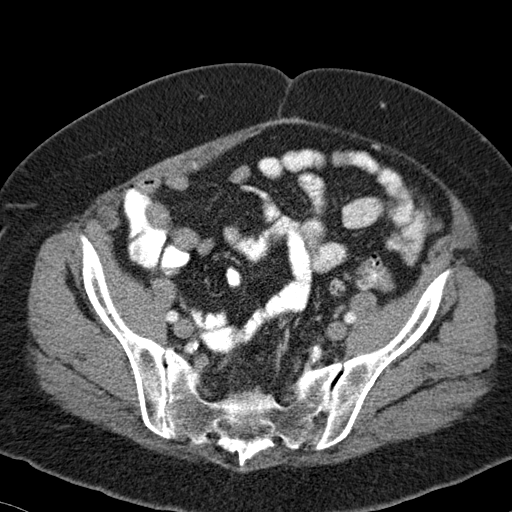
[im 39/88  soft-tissue]
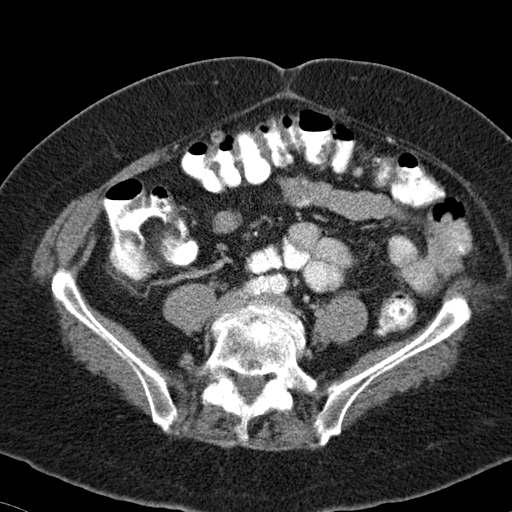
[im 44/88  soft-tissue]
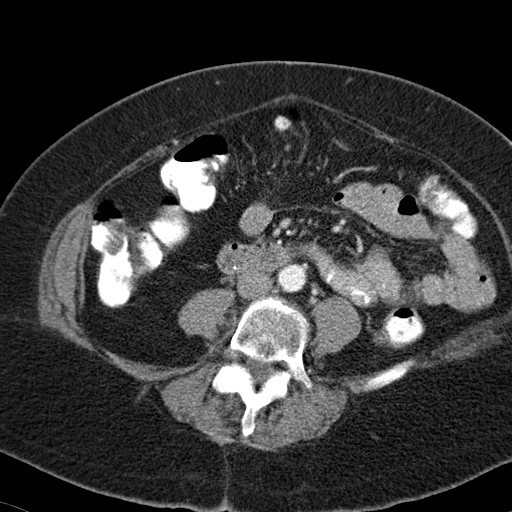
[im 49/88  soft-tissue]
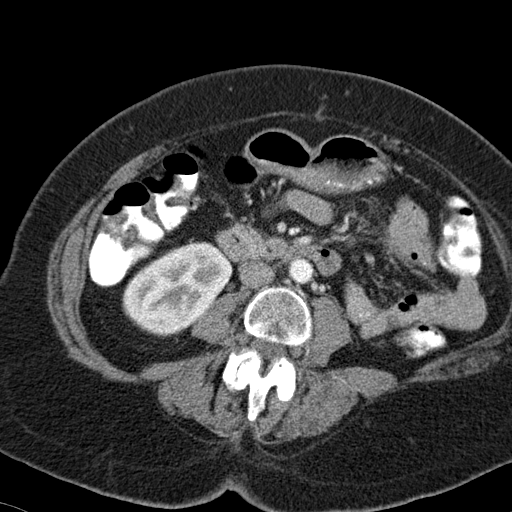
[im 59/88  soft-tissue]
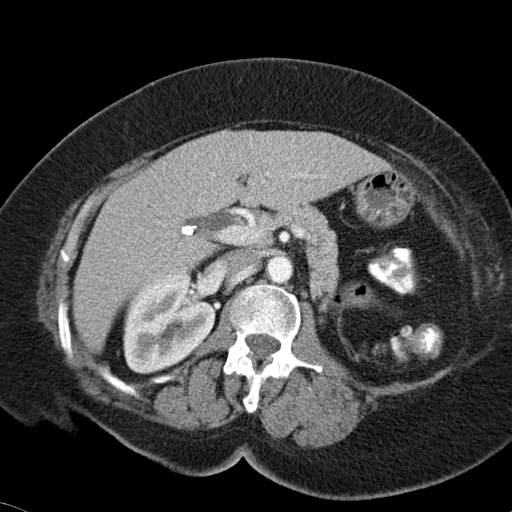
[im 59/88  bone]
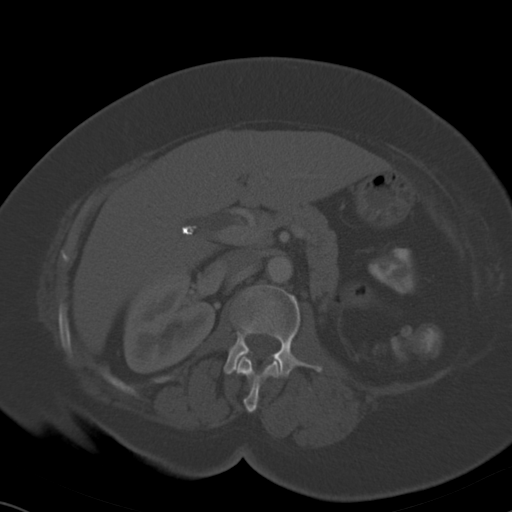
[im 63/88  soft-tissue]
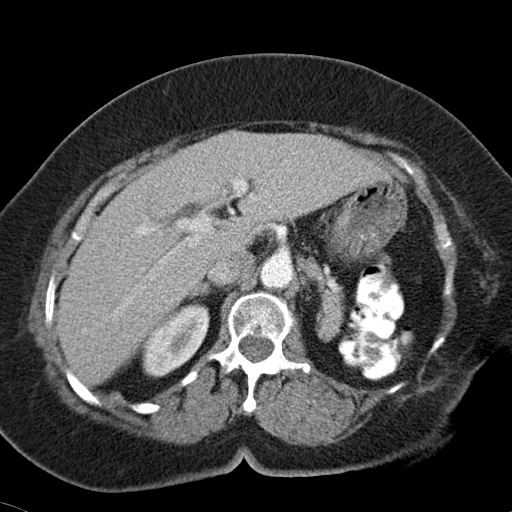
[im 68/88  soft-tissue]
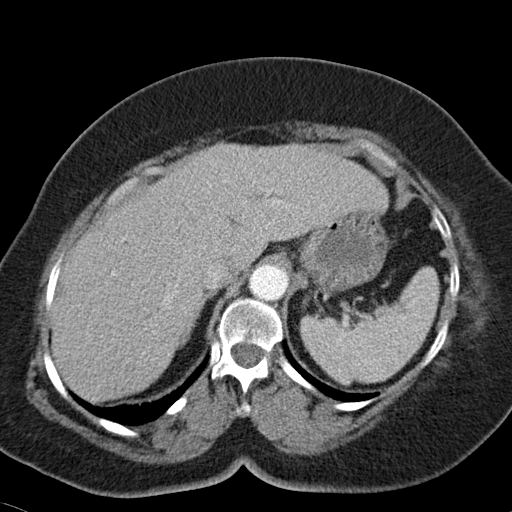
[im 78/88  soft-tissue]
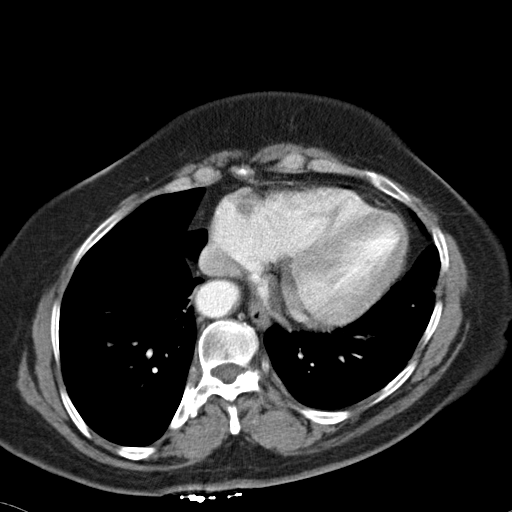
[im 83/88  soft-tissue]
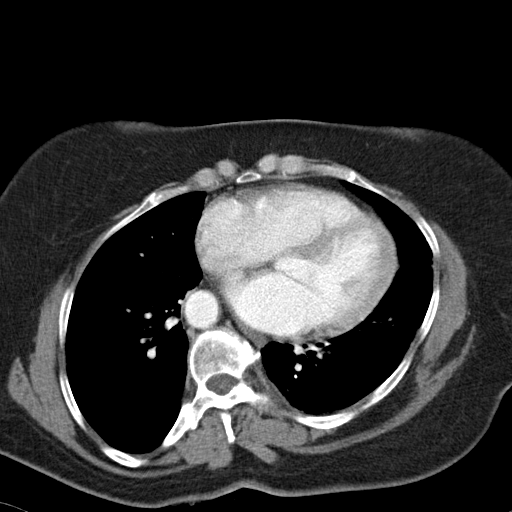

[Series 3: coronal · coronal · 0.78mm/px · 3 of 151 slices shown]
[im 51/151  soft-tissue]
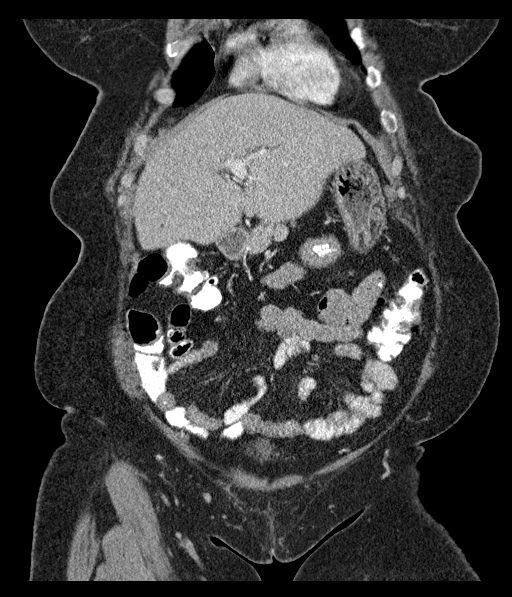
[im 67/151  soft-tissue]
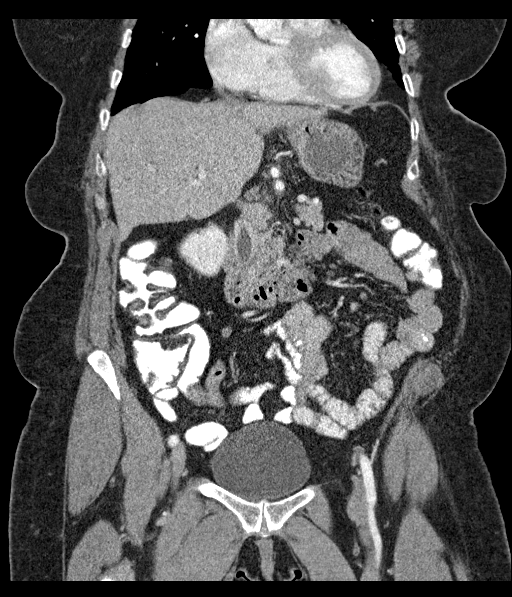
[im 84/151  soft-tissue]
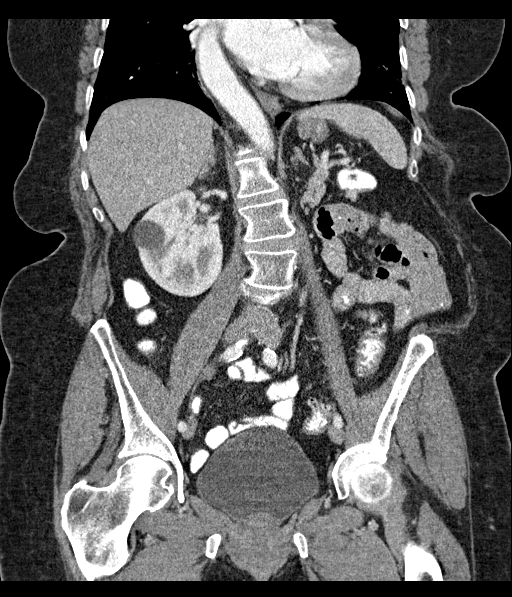

[16 of 46 positions shown; findings below may reference images not displayed]

FINDINGS: Lower chest: Mild left lung base atelectasis. There is a smoothly
marginated subpleural left lower lobe 6.7 mm nodule.

Hepatobiliary: No masses or other significant abnormality.
Postcholecystectomy. Common bile duct measures 11 mm, which is
borderline enlarged post cholecystectomy.

Pancreas: No mass, inflammatory changes, or other significant
abnormality.

Spleen: Within normal limits in size and appearance.

Adrenals/Urinary Tract: Left kidney is surgically absent. The right
kidney demonstrates normal cortical medullary differentiation. There
is a partially exophytic 3.7 cm right midpole renal cyst.

Stomach/Bowel: No evidence of obstruction, inflammatory process, or
abnormal fluid collections. Post appendectomy. Mild left colonic
diverticulosis without evidence of diverticulitis.

Vascular/Lymphatic: No pathologically enlarged lymph nodes. No
evidence of abdominal aortic aneurysm.

Reproductive: No mass or other significant abnormality, post
hysterectomy.

Other: There is a fat containing left lateral abdominal wall hernia
with wide neck and no evidence of strangulation, at the surgical
site from prior left nephrectomy.

Musculoskeletal: No suspicious bone lesions identified. Levoconvex
lumbar spine scoliosis with associated osteoarthritic changes.
IMPRESSION: No evidence of acute abnormality within the abdomen or pelvis.

Large but benign-appearing right renal cyst.

Status post left nephrectomy with postsurgical changes and
associated fat containing left lateral abdominal wall hernia.

Predominantly left-sided colonic diverticulosis without evidence of
diverticulitis.

Mild extrahepatic biliary ductal dilation, status post
cholecystectomy, nonspecific finding. Please correlate clinically.

## 2017-04-14 ENCOUNTER — Ambulatory Visit (HOSPITAL_COMMUNITY)
Admission: RE | Admit: 2017-04-14 | Discharge: 2017-04-14 | Disposition: A | Discharge status: Home / Self Care | Payer: Medicare Other | Source: Ambulatory Visit | Attending: Internal Medicine | Admitting: Internal Medicine

## 2017-04-14 DIAGNOSIS — Z1231 Encounter for screening mammogram for malignant neoplasm of breast: Secondary | ICD-10-CM | POA: Insufficient documentation

## 2017-08-22 ENCOUNTER — Encounter: Payer: Self-pay | Admitting: Neurology

## 2017-09-11 NOTE — Progress Notes (Addendum)
[name] was seen today in the movement disorders clinic for neurologic consultation at the request of Marton Redwood, MD.  The consultation is for the evaluation of PD.  The records that were made available to me were reviewed.  Pt has previously seen Dr. Rexene Alberts, last in June, 2016.  She saw Dr. Merlene Laughter prior to Dr. Rexene Alberts.  Patient reports that her first symptom was tremor, which started in approximately 2015.  This was right hand tremor, but primarily was an action tremor and not a rest tremor.  She did fall in 2015 after tripping over a chair.  This resulted in a fractured left fourth toe.  She saw Dr. Merlene Laughter at the end of 2015 and was given a prescription for carbidopa/levodopa 25/100 and was to titrate up to 3 times per day.  The patient reports that she could not tolerate it and felt depressed.  She took 1 pill once per day for 1-1/2 weeks and then stopped the medication.  She reports today that she had no SE with the medication; she just didn't want to take the medication.  When she saw Dr. Rexene Alberts in February, 2016, Dr. Rexene Alberts told her that she did not see evidence of Parkinson's disease, but rather just tremor.  She followed back up with Dr. Rexene Alberts in June, 2016.  Her examination was stable and it was felt that she did not have Parkinson's disease.  She went back to Dr. Merlene Laughter and saw him a few times.    Tremor: Yes.    At rest or with activation?  With activation.  Never tremors at rest Fam hx of tremor?  No. Located where?  R hand, worse when gets nervous Affected by caffeine:  No. (1 cup coffee per day) Affected by alcohol: doesn't drink Affected by stress:  Yes.   Affected by fatigue:  No., but will get worse with hunger Spills soup if on spoon:  Yes.  , if uses the R hand (but is L handed so doesn't use it much) Affects ADL's (tying shoes, brushing teeth, etc):  No.   Specific Symptoms:  Voice: no change Sleep: stays up really late, but sleeps well when she goes to  sleep  Vivid Dreams:  No.  Acting out dreams:  No. (used to have nightmares but not any longer) Wet Pillows: No. Postural symptoms:  No.  Falls?  No. Bradykinesia symptoms: difficulty getting out of a chair (due to knee and hip and low back pain) Loss of smell:  No. Loss of taste:  No. Urinary Incontinence:  No. Difficulty Swallowing:  No. Handwriting, micrographia: No. Depression:  "I can't really tell"; husband has some medical problems and been stressful Memory changes:  Yes.  - thinks that is from stress/"overload" Hallucinations:  No.  visual distortions: No. N/V:  No. Lightheaded:  No.  Syncope: No. Diplopia:  No. Dyskinesia:  No.  Neuroimaging of the brain has not been performed in the recent years.  CT of the brain was performed in March, 2008 and was unremarkable.    PREVIOUS MEDICATIONS: less than a week of carbidopa/levodopa, taken once per day.  Stopped it as didn't feel that medication was necessary  ALLERGIES:   Allergies  Allergen Reactions  . Morphine And Related Hypertension    CURRENT MEDICATIONS:  Outpatient Encounter Medications as of 09/15/2017  Medication Sig  . ALPRAZolam (XANAX) 0.5 MG tablet Take 0.5 mg by mouth daily as needed. For anxiety or sleep  . amLODipine-valsartan (EXFORGE) 5-160 MG  per tablet Take 1 tablet by mouth daily.  [name] aspirin EC 81 MG tablet Take 81 mg by mouth every morning.  . fluvastatin (LESCOL) 20 MG capsule Take 1 capsule by mouth daily.  . furosemide (LASIX) 20 MG tablet Take 20 mg by mouth at bedtime.   [name] HYDROcodone-acetaminophen (NORCO/VICODIN) 5-325 MG tablet Take 1 tablet by mouth 2 (two) times daily.  . Multiple Vitamins-Minerals (MULTIVITAMIN WITH MINERALS) tablet Take 2 tablets by mouth daily.  . NON FORMULARY 1 packet. Smooth Move as needed for constipation  . omeprazole (PRILOSEC) 20 MG capsule Take 20 mg by mouth daily as needed. For acid reflux  . OVER THE COUNTER MEDICATION Take 1 tablet by mouth daily as needed.  When pt has ice cream, over the counter medication that helps digest. Not sure what the name is.  . Vitamin D, Ergocalciferol, (DRISDOL) 50000 units CAPS capsule Take 50,000 Units by mouth once a week. Mondays   No facility-administered encounter medications on file as of 09/15/2017.     PAST MEDICAL HISTORY:   Past Medical History:  Diagnosis Date  . Anxiety   . Arthritis   . Bronchiolitis    acute  . Chronic back pain   . Depression   . Diverticulosis   . GERD (gastroesophageal reflux disease)    negative H pylori stool Ag  . High cholesterol   . HTN (hypertension)   . Hyperplastic colon polyp 11/08/08  . IBS (irritable bowel syndrome)   . Tubular adenoma of colon   . Umbilical hernia   . Wears glasses     PAST SURGICAL HISTORY:   Past Surgical History:  Procedure Laterality Date  . ABDOMINAL HYSTERECTOMY  1985  . APPENDECTOMY    . BIOPSY  05/09/2016   Procedure: BIOPSY;  Surgeon: Daneil Dolin, MD;  [facility];  Service: Endoscopy;;  bx ulcer at ileocecal valve  . BREAST SURGERY  1989   both sides  . CARPAL TUNNEL RELEASE Right 08/23/2013   Procedure: RIGHT ENDOSCOPIC CARPAL  TUNNEL RELEASE;  Surgeon: Jolyn Nap, MD;  Location: [facility];  Service: Orthopedics;  Laterality: Right;  . [place] SURGERY  2007  . CHOLECYSTECTOMY  1980  . COLONOSCOPY  11/08/08   Dr. Gala Romney- hemorrhoids, pancolonic diverticula,,hyperplastic polyp  . COLONOSCOPY   08/05/2005   QIH:[id number] rectum/Diminutive polyp at 88 cm/Pancolonic diverticula/benign-appearing ulcer in the mid descending colon  . COLONOSCOPY N/A 03/30/2013   DGL:OVFIEPP anal canal hemorrhoids. Single colonic polyp-removed (tubular adenoma). Colonic diverticulosis.  . COLONOSCOPY N/A 05/09/2016   Procedure: COLONOSCOPY;  Surgeon: Daneil Dolin, MD;  Location: [facility];  Service: Endoscopy;  Laterality: N/A;  1045  . DORSAL COMPARTMENT RELEASE Left 12/12/2014   Procedure: LEFT WRIST RELEASE  DORSAL COMPARTMENT (DEQUERVAIN);  Surgeon: Milly Jakob, MD;  Location: [facility];  Service: Orthopedics;  Laterality: Left;  . ECTOPIC PREGNANCY SURGERY     bilat  . FOOT SURGERY Right 02/2017  . NEPHRECTOMY  1975   left-infection  . TRIGGER FINGER RELEASE Right 08/23/2013   Procedure: RIGHT INDEX FINGER TRIGGER RELEASE ;  Surgeon: Jolyn Nap, MD;  Location: [facility];  Service: Orthopedics;  Laterality: Right;    SOCIAL HISTORY:   Social History   Socioeconomic History  . Marital status: Married    Spouse name: [name]  . Number of children: 1  . Years of education: some coll.  . Highest education level: Not on file  Social Needs  . Financial resource strain: Not on file  . Food insecurity - worry: Not on file  . Food insecurity - inability: Not on file  . Transportation needs - medical: Not on file  . Transportation needs - non-medical: Not on file  Occupational History  . 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 Smoking status: Former Smoker    Packs/day: 0.25    Years: 30.00    Pack years: 7.50    Types: Cigarettes    Last attempt to quit: 08/19/2000    Years since quitting: 17.0  . Smokeless tobacco: Never Used  . Tobacco comment: "smoked but not inhaled"  Substance and Sexual Activity  . Alcohol use: No    Alcohol/week: 0.0 oz  . Drug use: No  . Sexual activity: Yes    Birth control/protection: Surgical  Other Topics Concern  . Not on file  Social History Narrative   Consumes 2 cups of caffeine daily    FAMILY HISTORY:   Family Status  Relation Name Status  . Brother  Deceased  . Mother  Deceased at age 63       heart attack  . Father  Deceased       unknown,as a child  . Sister x2 Alive  . Son  Alive  . Neg Hx  (Not Specified)    ROS:  A complete 10 system review of systems was obtained and was unremarkable apart from what is mentioned above.  PHYSICAL  EXAMINATION:    VITALS:   Vitals:   09/15/17 1259  BP: 120/80  Pulse: 88  SpO2: 97%  Weight: 183 lb (83 kg)  Height: 5\' 7"  (1.702 m)    GEN:  The patient appears stated age and is in NAD. HEENT:  Normocephalic, atraumatic.  The mucous membranes are moist. The superficial temporal arteries are without ropiness or tenderness. CV:  RRR Lungs:  CTAB Neck/HEME:  There are no carotid bruits bilaterally.  Neurological examination:  Orientation: The patient is alert and oriented x3. Fund of knowledge is appropriate.  Recent and remote memory are intact.  Attention and concentration are normal.    Able to name objects and repeat phrases. Cranial nerves: There is good facial symmetry. Pupils are equal round and reactive to light bilaterally. Fundoscopic exam reveals clear margins bilaterally. Extraocular muscles are intact. The visual fields are full to confrontational testing. The speech is fluent and clear. Soft palate rises symmetrically and there is no tongue deviation. Hearing is intact to conversational tone. Sensation: Sensation is intact to light and pinprick throughout (facial, trunk, extremities). Vibration is intact at the bilateral big toe. There is no extinction with double simultaneous stimulation. There is no sensory dermatomal level identified. Motor: Strength is 5/5 in the bilateral upper and lower extremities.   Shoulder shrug is equal and symmetric.  There is no pronator drift. Deep tendon reflexes: Deep tendon reflexes are 2/4 at the bilateral biceps, triceps, brachioradialis, patella (left requires Jendrassik maneuver) and achilles. Plantar responses are downgoing bilaterally.  Movement examination: Tone: There is normal tone in the bilateral upper extremities.  The tone in the lower extremities is normal.  Abnormal movements: There is no rest tremor.  There is a right upper extremity intention tremor.  This is moderate to severe.  She has trouble with Archimedes spirals on the  right, primarily with getting the pen on the paper (she is left-hand dominant).  She does  have trouble pouring water from one glass to another when the full glass of water is in the right hand.  She does not spill a ton of the water, however. Coordination:  There is no decremation with RAM's, with any form of RAMS, including alternating supination and pronation of the forearm, hand opening and closing, finger taps, heel taps and toe taps. Gait and Station: The patient has no difficulty arising out of a deep-seated chair without the use of the hands.  She is able to ambulate in a heel toe fashion.  She is able to ambulate in a tandem fashion.  She stands in the Romberg position with no trouble.  No results found for: TSH   Chemistry      Component Value Date/Time   NA 137 12/09/2014 1300   K 4.2 12/09/2014 1300   CL 104 12/09/2014 1300   CO2 26 12/09/2014 1300   BUN 13 12/09/2014 1300   CREATININE 0.96 12/09/2014 1300      Component Value Date/Time   CALCIUM 9.3 12/09/2014 1300   ALKPHOS 75 04/07/2010 1931   AST 20 04/07/2010 1931   ALT 15 04/07/2010 1931   BILITOT 0.6 04/07/2010 1931     Addendum labs: Labs were received and dated June 17, 2017.  Sodium was 142, potassium 4.2, chloride 105, CO2 29, BUN 14 and creatinine 0.9.  AST 15, ALT 15, alkaline phosphatase 97.  ASSESSMENT/PLAN:  1.  Essential Tremor.  -I reassured the patient that I saw no evidence of a neurodegenerative process such as Parkinson's disease.  I told the patient that she has asymmetric essential tremor, which can happen in 30% of the cases.  We discussed nature and pathophysiology.  We discussed that this can continue to gradually get worse with time.  We discussed that some medications can worsen this, as can caffeine use.  We discussed medication therapy as well as surgical therapy.  She was shown HIPAA compliant videos of patients who have had surgery.  Ultimately, the patient decided to try primidone.  Risks,  benefits, side effects and alternative therapies were discussed.  The opportunity to ask questions was given and they were answered to the best of my ability.  The patient expressed understanding and willingness to follow the outlined treatment protocols.    -We discussed the fact that stress could have on tremor.  We talked about stress reduction techniques.  This is a huge initiator of tremor for her.  -we will get labs from her PCP  2.  Follow up is anticipated in the next few months, sooner should new neurologic issues arise.  Much greater than 50% of this visit was spent in counseling and coordinating care.  Total face to face time:  45 min   Cc:  Tisovec, Fransico Him, MD

## 2017-09-15 ENCOUNTER — Encounter: Payer: Self-pay | Admitting: Neurology

## 2017-09-15 ENCOUNTER — Ambulatory Visit: Payer: Medicare Other | Admitting: Neurology

## 2017-09-15 VITALS — BP 120/80 | HR 88 | Ht 67.0 in | Wt 183.0 lb

## 2017-09-15 DIAGNOSIS — G250 Essential tremor: Secondary | ICD-10-CM | POA: Diagnosis not present

## 2017-09-15 MED ORDER — PRIMIDONE 50 MG PO TABS: 50.0000 mg | ORAL_TABLET | Freq: Every day | ORAL | 1 refills | Status: DC

## 2017-09-15 NOTE — Patient Instructions (Signed)
1. Start Primidone 50 mg tablets. Take 1/2 tablet for 4 nights, then increase to 1 tablet. Prescription has been sent to your pharmacy. The first dose of medication can cause some dizziness/nausea that should go away after the first dose.   

## 2017-10-01 ENCOUNTER — Telehealth: Payer: Self-pay | Admitting: Neurology

## 2017-10-01 NOTE — Telephone Encounter (Signed)
Called patient to see how she is doing on Primidone. She states she did get confused about dosage and has stayed on Primidone 50 mg - 1/2 tablet hs. No side effects. She states it is helping tremor a lot. She can hold cups without spilling them now, etc. She is still having a little bit of tremor in the mornings. She will increase to one tablet at night to see if that helps. If she has issues she will go back to 1/2 since it is helping. She will call with any problems.   Dr. Carles Collet Juluis Rainier.

## 2017-11-18 ENCOUNTER — Other Ambulatory Visit: Payer: Self-pay | Admitting: Neurology

## 2017-12-09 ENCOUNTER — Encounter (INDEPENDENT_AMBULATORY_CARE_PROVIDER_SITE_OTHER): Payer: Self-pay

## 2017-12-09 ENCOUNTER — Ambulatory Visit: Payer: Medicare PPO | Admitting: Adult Health

## 2017-12-09 ENCOUNTER — Encounter: Payer: Self-pay | Admitting: Adult Health

## 2017-12-09 VITALS — BP 124/87 | HR 71 | Ht 67.0 in | Wt 179.0 lb

## 2017-12-09 DIAGNOSIS — N898 Other specified noninflammatory disorders of vagina: Secondary | ICD-10-CM | POA: Diagnosis not present

## 2017-12-09 DIAGNOSIS — N952 Postmenopausal atrophic vaginitis: Secondary | ICD-10-CM | POA: Diagnosis not present

## 2017-12-09 LAB — POCT WET PREP (WET MOUNT): Clue Cells Wet Prep Whiff POC: NEGATIVE

## 2017-12-09 MED ORDER — ESTROGENS, CONJUGATED 0.625 MG/GM VA CREA: TOPICAL_CREAM | VAGINAL | 3 refills | Status: DC

## 2017-12-09 NOTE — Patient Instructions (Signed)
Atrophic Vaginitis Atrophic vaginitis is a condition in which the tissues that line the vagina become dry and thin. This condition is most common in women who have stopped having regular menstrual periods (menopause). This usually starts when a woman is 45-72 years old. Estrogen helps to keep the vagina moist. It stimulates the vagina to produce a clear fluid that lubricates the vagina for sexual intercourse. This fluid also protects the vagina from infection. Lack of estrogen can cause the lining of the vagina to get thinner and dryer. The vagina may also shrink in size. It may become less elastic. Atrophic vaginitis tends to get worse over time as a woman's estrogen level drops. What are the causes? This condition is caused by the normal drop in estrogen that happens around the time of menopause. What increases the risk? Certain conditions or situations may lower a woman's estrogen level, which increases her risk of atrophic vaginitis. These include:  Taking medicine that blocks estrogen.  Having ovaries removed surgically.  Being treated for cancer with X-ray treatment (radiation) or medicines (chemotherapy).  Exercising very hard and often.  Having an eating disorder (anorexia).  Giving birth or breastfeeding.  Being over the age of 50.  Smoking.  What are the signs or symptoms? Symptoms of this condition include:  Pain, soreness, or bleeding during sexual intercourse (dyspareunia).  Vaginal burning, irritation, or itching.  Pain or bleeding during a vaginal examination using a speculum (pelvic exam).  Loss of interest in sexual activity.  Having burning pain when passing urine.  Vaginal discharge that is brown or yellow.  In some cases, there are no symptoms. How is this diagnosed? This condition is diagnosed with a medical history and physical exam. This will include a pelvic exam that checks whether the inside of your vagina appears pale, thin, or dry. Rarely, you may  also have other tests, including:  A urine test.  A test that checks the acid balance in your vaginal fluid (acid balance test).  How is this treated? Treatment for this condition may depend on the severity of your symptoms. Treatment may include:  Using an over-the-counter vaginal lubricant before you have sexual intercourse.  Using a long-acting vaginal moisturizer.  Using low-dose vaginal estrogen for moderate to severe symptoms that do not respond to other treatments. Options include creams, tablets, and inserts (vaginal rings). Before using vaginal estrogen, tell your health care provider if you have a history of: ? Breast cancer. ? Endometrial cancer. ? Blood clots.  Taking medicines. You may be able to take a daily pill for dyspareunia. Discuss all of the risks of this medicine with your health care provider. It is usually not recommended for women who have a family history or personal history of breast cancer.  If your symptoms are very mild and you are not sexually active, you may not need treatment. Follow these instructions at home:  Take medicines only as directed by your health care provider. Do not use herbal or alternative medicines unless your health care provider says that you can.  Use over-the-counter creams, lubricants, or moisturizers for dryness only as directed by your health care provider.  If your atrophic vaginitis is caused by menopause, discuss all of your menopausal symptoms and treatment options with your health care provider.  Do not douche.  Do not use products that can make your vagina dry. These include: ? Scented feminine sprays. ? Scented tampons. ? Scented soaps.  If it hurts to have sex, talk with your sexual   partner. Contact a health care provider if:  Your discharge looks different than normal.  Your vagina has an unusual smell.  You have new symptoms.  Your symptoms do not improve with treatment.  Your symptoms get worse. This  information is not intended to replace advice given to you by your health care provider. Make sure you discuss any questions you have with your health care provider. Document Released: 11/08/2014 Document Revised: 11/30/2015 Document Reviewed: 06/15/2014 Elsevier Interactive Patient Education  2018 [employer] Inc.  

## 2017-12-09 NOTE — Progress Notes (Addendum)
  Subjective:     Patient ID: [name], female   DOB: 1946/02/15, 72 y.o.   MRN: [id number]  HPI Sache is a 72 year old black female, married, sp hysterectomy in complaining of vaginal discharge, is yellow, no odor, mild itch at times.She has been treated for UTI recently and also used Metrogel. PCP is [facility].   Review of Systems +vaginal discharge, yellow, no odor, mild itch at times Denies any UI She is not currently sexually active  Reviewed past medical,surgical, social and family history. Reviewed medications and allergies.     Objective:   Physical Exam  BP 124/87 (BP Location: Left Arm, Patient Position: Sitting, Cuff Size: Normal)   Pulse 71   Ht 5\' 7"  (1.702 m)   Wt 179 lb (81.2 kg)   BMI 28.04 kg/m PHQ 2 score 0.  Skin warm and dry.Pelvic: external genitalia is normal in appearance no lesions, vagina: white discharge without odor,pale with loss of moisture and rugae, mild cystocele,tender over rectum,urethra has no lesions or masses noted, cervix and uterus are absent,adnexa: no masses or tenderness noted. Bladder is non tender and no masses felt. Wet prep: +few WBC.On rectal exam has good tone, no masses. Will try premarin vaginal cream.    Ok to return to pool activities.   Assessment:     1. Vaginal discharge   2. Vaginal atrophy       Plan:    Given 12 gm of PVC to start now Meds ordered this encounter  Medications  . conjugated estrogens (PREMARIN) vaginal cream    Sig: Use use 0.5 gm in vagina at bedtime for 2 weeks then 2 x weekly    Dispense:  42.5 g    Refill:  3    Order Specific Question:   Supervising Provider    Answer:   Tania Ade H [[id number]]  Review handout on Atropic vaginitis    F/U in 3 weeks

## 2017-12-16 ENCOUNTER — Encounter: Payer: Self-pay | Admitting: Neurology

## 2017-12-16 ENCOUNTER — Ambulatory Visit: Payer: Medicare Other | Admitting: Neurology

## 2017-12-16 VITALS — BP 110/70 | HR 74 | Ht 67.0 in | Wt 181.4 lb

## 2017-12-16 DIAGNOSIS — F411 Generalized anxiety disorder: Secondary | ICD-10-CM | POA: Diagnosis not present

## 2017-12-16 DIAGNOSIS — G250 Essential tremor: Secondary | ICD-10-CM

## 2017-12-16 NOTE — Patient Instructions (Signed)
1.  Increase primidone to 50 mg - 1 in the AM and 1 at bedtime.  Call me when you need a refill of the medication so that I can send enough to accomodate the increase

## 2017-12-16 NOTE — Progress Notes (Signed)
[name] was seen today in the movement disorders clinic for neurologic consultation at the request of Tisovec, Fransico Him, MD.  The consultation is for the evaluation of PD.  The records that were made available to me were reviewed.  Pt has previously seen Dr. Rexene Alberts, last in June, 2016.  She saw Dr. Merlene Laughter prior to Dr. Rexene Alberts.  Patient reports that her first symptom was tremor, which started in approximately 2015.  This was right hand tremor, but primarily was an action tremor and not a rest tremor.  She did fall in 2015 after tripping over a chair.  This resulted in a fractured left fourth toe.  She saw Dr. Merlene Laughter at the end of 2015 and was given a prescription for carbidopa/levodopa 25/100 and was to titrate up to 3 times per day.  The patient reports that she could not tolerate it and felt depressed.  She took 1 pill once per day for 1-1/2 weeks and then stopped the medication.  She reports today that she had no SE with the medication; she just didn't want to take the medication.  When she saw Dr. Rexene Alberts in February, 2016, Dr. Rexene Alberts told her that she did not see evidence of Parkinson's disease, but rather just tremor.  She followed back up with Dr. Rexene Alberts in June, 2016.  Her examination was stable and it was felt that she did not have Parkinson's disease.  She went back to Dr. Merlene Laughter and saw him a few times.    Tremor: Yes.    At rest or with activation?  With activation.  Never tremors at rest Fam hx of tremor?  No. Located where?  R hand, worse when gets nervous Affected by caffeine:  No. (1 cup coffee per day) Affected by alcohol: doesn't drink Affected by stress:  Yes.   Affected by fatigue:  No., but will get worse with hunger Spills soup if on spoon:  Yes.  , if uses the R hand (but is L handed so doesn't use it much) Affects ADL's (tying shoes, brushing teeth, etc):  No.   Specific Symptoms:  Voice: no change Sleep: stays up really late, but sleeps well when she goes to  sleep  Vivid Dreams:  No.  Acting out dreams:  No. (used to have nightmares but not any longer) Wet Pillows: No. Postural symptoms:  No.  Falls?  No. Bradykinesia symptoms: difficulty getting out of a chair (due to knee and hip and low back pain) Loss of smell:  No. Loss of taste:  No. Urinary Incontinence:  No. Difficulty Swallowing:  No. Handwriting, micrographia: No. Depression:  "I can't really tell"; husband has some medical problems and been stressful Memory changes:  Yes.  - thinks that is from stress/"overload" Hallucinations:  No.  visual distortions: No. N/V:  No. Lightheaded:  No.  Syncope: No. Diplopia:  No. Dyskinesia:  No.  Neuroimaging of the brain has not been performed in the recent years.  CT of the brain was performed in March, 2008 and was unremarkable.    PREVIOUS MEDICATIONS: less than a week of carbidopa/levodopa, taken once per day.  Stopped it as didn't feel that medication was necessary  12/16/17 update: Patient is seen today in follow-up for essential tremor.  She was started on primidone last visit.  She thinks that med is helpful but still has some tremor.  Anxiety and rushing makes tremor worse.  No SE.  no falls since our last visit.    ALLERGIES:  Allergies  Allergen Reactions  . Morphine And Related Hypertension    CURRENT MEDICATIONS:  Outpatient Encounter Medications as of 12/16/2017  Medication Sig  . amLODipine-valsartan (EXFORGE) 5-160 MG per tablet Take 1 tablet by mouth daily.  [name] conjugated estrogens (PREMARIN) vaginal cream Use use 0.5 gm in vagina at bedtime for 2 weeks then 2 x weekly  . fluvastatin (LESCOL) 20 MG capsule Take 1 capsule by mouth daily.  . furosemide (LASIX) 20 MG tablet Take 20 mg by mouth at bedtime.   [name] HYDROcodone-acetaminophen (NORCO/VICODIN) 5-325 MG tablet Take 1 tablet by mouth 2 (two) times daily.  . metoprolol tartrate (LOPRESSOR) 25 MG tablet Take 12.5 mg by mouth as needed.  . NON FORMULARY cbd oil  .  omeprazole (PRILOSEC) 20 MG capsule Take 20 mg by mouth daily as needed. For acid reflux  . primidone (MYSOLINE) 50 MG tablet TAKE 1 TABLET BY MOUTH EVERYDAY AT BEDTIME  . TURMERIC PO Take by mouth.  . Vitamin D, Ergocalciferol, (DRISDOL) 50000 units CAPS capsule Take 50,000 Units by mouth once a week. Mondays   No facility-administered encounter medications on file as of 12/16/2017.     PAST MEDICAL HISTORY:   Past Medical History:  Diagnosis Date  . Anxiety   . Arthritis   . Bronchiolitis    acute  . Chronic back pain   . Depression   . Diverticulosis   . GERD (gastroesophageal reflux disease)    negative H pylori stool Ag  . High cholesterol   . HTN (hypertension)   . Hyperplastic colon polyp 11/08/08  . IBS (irritable bowel syndrome)   . Tubular adenoma of colon   . Umbilical hernia   . Wears glasses     PAST SURGICAL HISTORY:   Past Surgical History:  Procedure Laterality Date  . ABDOMINAL HYSTERECTOMY  1985  . APPENDECTOMY    . BIOPSY  05/09/2016   Procedure: BIOPSY;  Surgeon: Daneil Dolin, MD;  [facility];  Service: Endoscopy;;  bx ulcer at ileocecal valve  . BREAST SURGERY  1989   both sides  . CARPAL TUNNEL RELEASE Right 08/23/2013   Procedure: RIGHT ENDOSCOPIC CARPAL  TUNNEL RELEASE;  Surgeon: Jolyn Nap, MD;  Location: [place];  Service: Orthopedics;  Laterality: Right;  . CATARACT EXTRACTION, BILATERAL    . [place] SURGERY  2007  . CHOLECYSTECTOMY  1980  . COLONOSCOPY  11/08/08   Dr. Gala Romney- hemorrhoids, pancolonic diverticula,,hyperplastic polyp  . COLONOSCOPY   08/05/2005   OZD:GUYQIH rectum/Diminutive polyp at 29 cm/Pancolonic diverticula/benign-appearing ulcer in the mid descending colon  . COLONOSCOPY N/A 03/30/2013   KVQ:QVZDGLO anal canal hemorrhoids. Single colonic polyp-removed (tubular adenoma). Colonic diverticulosis.  . COLONOSCOPY N/A 05/09/2016   Procedure: COLONOSCOPY;  Surgeon: Daneil Dolin, MD;   [facility]: [facility];  Service: Endoscopy;  Laterality: N/A;  1045  . DORSAL COMPARTMENT RELEASE Left 12/12/2014   Procedure: LEFT WRIST RELEASE DORSAL COMPARTMENT (DEQUERVAIN);  Surgeon: Milly Jakob, MD;  Location: [place];  Service: Orthopedics;  Laterality: Left;  . ECTOPIC PREGNANCY SURGERY     bilat  . FOOT SURGERY Right 02/2017  . NEPHRECTOMY  1975   left-infection  . TRIGGER FINGER RELEASE Right 08/23/2013   Procedure: RIGHT INDEX FINGER TRIGGER RELEASE ;  Surgeon: Jolyn Nap, MD;  Location: [place];  Service: Orthopedics;  Laterality: Right;    SOCIAL HISTORY:   Social History   Socioeconomic History  .  Marital status: Married    Spouse name: [name]  . Number of children: 1  . Years of education: some coll.  . Highest education level: Not on file  Occupational History  . 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 Financial resource strain: Not on file  . Food insecurity:    Worry: Not on file    Inability: Not on file  . Transportation needs:    Medical: Not on file    Non-medical: Not on file  Tobacco Use  . Smoking status: Former Smoker    Packs/day: 0.25    Years: 30.00    Pack years: 7.50    Types: Cigarettes    Last attempt to quit: 08/19/2000    Years since quitting: 17.3  . Smokeless tobacco: Never Used  . Tobacco comment: "smoked but not inhaled"  Substance and Sexual Activity  . Alcohol use: No    Alcohol/week: 0.0 oz  . Drug use: No  . Sexual activity: Yes    Birth control/protection: Surgical    Comment: hysto  Lifestyle  . Physical activity:    Days per week: Not on file    Minutes per session: Not on file  . Stress: Not on file  Relationships  . Social connections:    Talks on phone: Not on file    Gets together: Not on file    Attends religious service: Not on file    Active member of club or organization: Not on file    Attends meetings of clubs  or organizations: Not on file    Relationship status: Not on file  . Intimate partner violence:    Fear of current or ex partner: Not on file    Emotionally abused: Not on file    Physically abused: Not on file    Forced sexual activity: Not on file  Other Topics Concern  . Not on file  Social History Narrative   Consumes 2 cups of caffeine daily    FAMILY HISTORY:   Family Status  Relation Name Status  . Brother  Deceased       stomach CA  . Mother  Deceased at age 76       heart attack  . Father  Deceased       unknown,as a child  . Sister x2 Alive  . Son  Alive  . Neg Hx  (Not Specified)    ROS:  Review of Systems  Cardiovascular: Negative.   Skin: Negative.   Neurological: Positive for tremors.  Psychiatric/Behavioral: The patient is nervous/anxious.      PHYSICAL EXAMINATION:    VITALS:   Vitals:   12/16/17 0917  BP: 110/70  Pulse: 74  SpO2: 95%  Weight: 181 lb 6 oz (82.3 kg)  Height: 5\' 7"  (1.702 m)    GEN:  The patient appears stated age and is in NAD. HEENT:  Normocephalic, atraumatic.  The mucous membranes are moist. The superficial temporal arteries are without ropiness or tenderness. CV:  RRR Lungs:  CTAB Neck/HEME:  There are no carotid bruits bilaterally.  Neurological examination:  Orientation: The patient is alert and oriented x3. Cranial nerves: There is good facial symmetry. The speech is fluent and clear. Soft palate rises symmetrically and there is no tongue deviation. Hearing is intact to conversational tone. Sensation: Sensation is intact to light touch throughout Motor: Strength is 5/5 in the bilateral upper and lower extremities.   Shoulder  shrug is equal and symmetric.  There is no pronator drift.  Movement examination: Tone: There is normal tone in the RUE.  There is normal tone in the LUE.  There is normal tone in the RLE.  There is normal tone in the LLE.  Abnormal movements: There is R>LUE intention tremor.  Has trouble with  archimedes spirals on the right Coordination:  There is no decremation with RAM's, with any form of RAMS, including alternating supination and pronation of the forearm, hand opening and closing, finger taps, heel taps and toe taps. Gait and Station: The patient has no difficulty arising out of a deep-seated chair without the use of the hands. The patient's stride length is normal.    No results found for: TSH   Chemistry      Component Value Date/Time   NA 137 12/09/2014 1300   K 4.2 12/09/2014 1300   CL 104 12/09/2014 1300   CO2 26 12/09/2014 1300   BUN 13 12/09/2014 1300   CREATININE 0.96 12/09/2014 1300      Component Value Date/Time   CALCIUM 9.3 12/09/2014 1300   ALKPHOS 75 04/07/2010 1931   AST 20 04/07/2010 1931   ALT 15 04/07/2010 1931   BILITOT 0.6 04/07/2010 1931     labs: Labs were received and dated June 17, 2017.  Sodium was 142, potassium 4.2, chloride 105, CO2 29, BUN 14 and creatinine 0.9.  AST 15, ALT 15, alkaline phosphatase 97.  ASSESSMENT/PLAN:  1.  Essential Tremor.  -increase primidone - 50 mg bid.  Risks, benefits, side effects and alternative therapies were discussed.  The opportunity to ask questions was given and they were answered to the best of my ability.  The patient expressed understanding and willingness to follow the outlined treatment protocols.  -she knows anxiety drives her tremor and she is working on recognizing that and trying techniques to improve anxiety level  2. F/u 5 months   Cc:  Tisovec, Fransico Him, MD

## 2017-12-18 ENCOUNTER — Telehealth: Payer: Self-pay | Admitting: Internal Medicine

## 2017-12-18 NOTE — Telephone Encounter (Signed)
Lmom, waiting on a return call.  

## 2017-12-18 NOTE — Telephone Encounter (Signed)
[phone] or [phone] Patient called and said that she is having bad diarrhea. Is there anything that she can take to help  Or something that can be called in

## 2017-12-22 NOTE — Telephone Encounter (Signed)
Received a VM for Friday or Saturday, pt was returning call from Friday and was doing somewhat better. Pt has an appointment this morning and will call back after her appointment.

## 2017-12-23 NOTE — Telephone Encounter (Signed)
Spoke with pt, she is much better. Pt will call again if needed.

## 2017-12-30 ENCOUNTER — Ambulatory Visit: Payer: Medicare Other | Admitting: Adult Health

## 2017-12-30 ENCOUNTER — Encounter: Payer: Self-pay | Admitting: Adult Health

## 2017-12-30 VITALS — BP 118/71 | HR 79 | Ht 67.0 in | Wt 182.0 lb

## 2017-12-30 DIAGNOSIS — N952 Postmenopausal atrophic vaginitis: Secondary | ICD-10-CM

## 2017-12-30 NOTE — Progress Notes (Signed)
  Subjective:     Patient ID: KYOMI HECTOR, female   DOB: 05-Sep-1945, 72 y.o.   MRN: [id number]  HPI Mayara is a 72 year old black female, married back in follow up of using PVC and is better.   Review of Systems Discharge is better, may see yellow if has not used PVC in a few days Reviewed past medical,surgical, social and family history. Reviewed medications and allergies.     Objective:   Physical Exam BP 118/71 (BP Location: Left Arm, Patient Position: Sitting, Cuff Size: Normal)   Pulse 79   Ht 5\' 7"  (1.702 m)   Wt 182 lb (82.6 kg)   BMI 28.51 kg/m    Skin warm and dry.Pelvic: external genitalia is normal in appearance no lesions, vagina: tissues pinker, has PVC in vault, no tenderness noted, mild cystocele,urethra has no lesions or masses noted, cervix and uterus are absent, adnexa: no masses or tenderness noted. Bladder is non tender and no masses felt.  Assessment:     1. Vaginal atrophy       Plan:     Continue Premarin vaginal cream 2-3 x weekly  F/U prn

## 2018-01-12 ENCOUNTER — Telehealth: Payer: Self-pay | Admitting: Neurology

## 2018-01-12 NOTE — Telephone Encounter (Signed)
Patient states she had an episode where she almost passed and she has noticed her tremors getting worse at times. Patient is wanting to discuss possibly increasing her dose of medication primidone or changing the time of taking it.

## 2018-01-13 NOTE — Telephone Encounter (Signed)
Spoke with patient. She states tremors have been worse. Mainly during the day. She never increased Primidone 50 mg to BID. She is still just taking one tablet at night. I encouraged her per last office note to add a tablet in the morning to see if that helps with daytime tremor.   She also states under a lot of stress and having a lot more anxiety. Her episode of almost "passing out" she states she was stressing out about her family member in the hospital and told her husband she felt like she was going to faint. She sat down and calmed down and was okay. She states the next morning she was feeling very jittery and her blood pressure was high. I told her it sounds more like she is having issues with anxiety (which is documented she has had in the past). I encouraged her to see psychiatry or a counselor to discuss options or coping mechanisms for anxiety and stress or to talk to her PCP about medication. She has been on Xanax in the past which she didn't like, and I explained there are alternate options for anxiety treatment that would not make her feel like that. She will discuss with PCP and think about psychiatry/counseling. She will call back if needed.   Dr. Carles Collet Juluis Rainier.

## 2018-01-20 ENCOUNTER — Ambulatory Visit: Payer: Medicare Other | Admitting: Neurology

## 2018-03-02 ENCOUNTER — Emergency Department (HOSPITAL_COMMUNITY)
Admission: EM | Admit: 2018-03-02 | Discharge: 2018-03-02 | Disposition: A | Discharge status: Home / Self Care | Payer: Medicare Other | Attending: Emergency Medicine | Admitting: Emergency Medicine

## 2018-03-02 ENCOUNTER — Other Ambulatory Visit: Payer: Self-pay

## 2018-03-02 ENCOUNTER — Encounter (HOSPITAL_COMMUNITY): Payer: Self-pay | Admitting: Emergency Medicine

## 2018-03-02 DIAGNOSIS — R519 Headache, unspecified: Secondary | ICD-10-CM

## 2018-03-02 DIAGNOSIS — R51 Headache: Secondary | ICD-10-CM | POA: Insufficient documentation

## 2018-03-02 DIAGNOSIS — Z79899 Other long term (current) drug therapy: Secondary | ICD-10-CM | POA: Diagnosis not present

## 2018-03-02 DIAGNOSIS — I10 Essential (primary) hypertension: Secondary | ICD-10-CM | POA: Diagnosis not present

## 2018-03-02 DIAGNOSIS — Z87891 Personal history of nicotine dependence: Secondary | ICD-10-CM | POA: Diagnosis not present

## 2018-03-02 LAB — BASIC METABOLIC PANEL
Anion gap: 6 (ref 5–15)
BUN: 12 mg/dL (ref 8–23)
CO2: 30 mmol/L (ref 22–32)
Calcium: 9.5 mg/dL (ref 8.9–10.3)
Chloride: 104 mmol/L (ref 98–111)
Creatinine, Ser: 0.83 mg/dL (ref 0.44–1.00)
GFR calc Af Amer: >60 mL/min (ref >60)
GFR calc non Af Amer: >60 mL/min (ref >60)
Glucose, Bld: 101 mg/dL — ABNORMAL HIGH (ref 70–99)
Potassium: 3.9 mmol/L (ref 3.5–5.1)
Sodium: 140 mmol/L (ref 135–145)

## 2018-03-02 LAB — CBC
HCT: 39.1 % (ref 36.0–46.0)
Hemoglobin: 12.8 g/dL (ref 12.0–15.0)
MCH: 29.7 pg (ref 26.0–34.0)
MCHC: 32.7 g/dL (ref 30.0–36.0)
MCV: 90.7 fL (ref 78.0–100.0)
Platelets: 283 10*3/uL (ref 150–400)
RBC: 4.31 MIL/uL (ref 3.87–5.11)
RDW: 12.8 % (ref 11.5–15.5)
WBC: 5.9 10*3/uL (ref 4.0–10.5)

## 2018-03-02 LAB — TROPONIN I: Troponin I: <0.03 ng/mL (ref <0.03)

## 2018-03-02 MED ORDER — SODIUM CHLORIDE 0.9 % IV BOLUS
500.0000 mL | Freq: Once | INTRAVENOUS | Status: AC
  Administered 2018-03-02: 500 mL via INTRAVENOUS

## 2018-03-02 MED ORDER — KETOROLAC TROMETHAMINE 30 MG/ML IJ SOLN
15.0000 mg | Freq: Once | INTRAMUSCULAR | Status: AC
  Administered 2018-03-02: 15 mg via INTRAVENOUS
  Filled 2018-03-02: qty 1

## 2018-03-02 MED ORDER — METOCLOPRAMIDE HCL 5 MG/ML IJ SOLN
5.0000 mg | Freq: Once | INTRAMUSCULAR | Status: AC
  Administered 2018-03-02: 5 mg via INTRAVENOUS
  Filled 2018-03-02: qty 2

## 2018-03-02 MED ORDER — DIPHENHYDRAMINE HCL 50 MG/ML IJ SOLN
12.5000 mg | Freq: Once | INTRAMUSCULAR | Status: AC
Start: 2018-03-02 — End: 2018-03-02
  Administered 2018-03-02: 12.5 mg via INTRAVENOUS
  Filled 2018-03-02: qty 1

## 2018-03-02 NOTE — ED Provider Notes (Signed)
[facility] EMERGENCY DEPARTMENT Provider Note   CSN: [id number] Arrival date & time: 03/02/18  1447     History   Chief Complaint Chief Complaint  Patient presents with  . Hypertension    HPI [name] is a 72 y.o. female.  HPI   72 year old female with hypertension.  She reports systolic blood pressure 585 today.  She declined blood pressure but she has had a mild headache.  She does not regularly check her blood pressure otherwise.  Reports compliance with medications.  No recent changes.  No acute visual changes.  No nausea or vomiting.  No acute numbness, tingling or focal loss of strength.  No swelling.  No chest pain.  No dyspnea.  Past Medical History:  Diagnosis Date  . Anxiety   . Arthritis   . Bronchiolitis    acute  . Chronic back pain   . Depression   . Diverticulosis   . GERD (gastroesophageal reflux disease)    negative H pylori stool Ag  . High cholesterol   . HTN (hypertension)   . Hyperplastic colon polyp 11/08/08  . IBS (irritable bowel syndrome)   . Tubular adenoma of colon   . Umbilical hernia   . Wears glasses     Patient Active Problem List   Diagnosis Date Noted  . Vaginal discharge 12/09/2017  . Vaginal atrophy 12/09/2017  . Heme positive stool 05/01/2016  . Abdominal pain 04/16/2016  . Constipation 05/26/2013  . Abdominal pain, other specified site 03/16/2013  . Mixed hyperlipidemia 12/19/2010  . Tubular adenoma of colon 11/14/2010  . ABDOMINAL PAIN, LEFT LOWER QUADRANT 03/15/2010  . EDEMA 08/25/2009  . Musculoskeletal chest pain 08/25/2009  . GERD 10/28/2008  . HYPERTENSION 10/27/2008  . Irritable bowel syndrome 10/27/2008  . BACK PAIN, CHRONIC 10/27/2008  . DIARRHEA 10/27/2008  . ABDOMINAL CRAMPS 10/27/2008  . COLONIC POLYPS, HX OF 10/27/2008    Past Surgical History:  Procedure Laterality Date  . ABDOMINAL HYSTERECTOMY  1985  . APPENDECTOMY    . BIOPSY  05/09/2016   Procedure: BIOPSY;  Surgeon: Daneil Dolin, MD;   [facility];  Service: Endoscopy;;  bx ulcer at ileocecal valve  . BREAST SURGERY  1989   both sides  . CARPAL TUNNEL RELEASE Right 08/23/2013   Procedure: RIGHT ENDOSCOPIC CARPAL  TUNNEL RELEASE;  Surgeon: Jolyn Nap, MD;  Location: [place];  Service: Orthopedics;  Laterality: Right;  . CATARACT EXTRACTION, BILATERAL    . [place] SURGERY  2007  . CHOLECYSTECTOMY  1980  . COLONOSCOPY  11/08/08   Dr. Gala Romney- hemorrhoids, pancolonic diverticula,,hyperplastic polyp  . COLONOSCOPY   08/05/2005   IDP:[id number] rectum/Diminutive polyp at 40 cm/Pancolonic diverticula/benign-appearing ulcer in the mid descending colon  . COLONOSCOPY N/A 03/30/2013   TIR:[id number] anal canal hemorrhoids. Single colonic polyp-removed (tubular adenoma). Colonic diverticulosis.  . COLONOSCOPY N/A 05/09/2016   Procedure: COLONOSCOPY;  Surgeon: Daneil Dolin, MD;  [facility];  Service: Endoscopy;  Laterality: N/A;  1045  . DORSAL COMPARTMENT RELEASE Left 12/12/2014   Procedure: LEFT WRIST RELEASE DORSAL COMPARTMENT (DEQUERVAIN);  Surgeon: Milly Jakob, MD;  Location: [place];  Service: Orthopedics;  Laterality: Left;  . ECTOPIC PREGNANCY SURGERY     bilat  . FOOT SURGERY Right 02/2017  . NEPHRECTOMY  1975   left-infection  . TRIGGER FINGER RELEASE Right 08/23/2013   Procedure: RIGHT INDEX FINGER TRIGGER RELEASE ;  Surgeon: Jolyn Nap, MD;  Location:  [place];  Service: Orthopedics;  Laterality: Right;     OB History    Gravida  4   Para  1   Term  1   Preterm      AB  3   Living  1     SAB      TAB      Ectopic      Multiple      Live Births               Home Medications    Prior to Admission medications   Medication Sig Start Date End Date Taking? Authorizing Provider  amLODipine-valsartan (EXFORGE) 5-160 MG per tablet Take 1 tablet by mouth daily.    [provider]  conjugated estrogens  (PREMARIN) vaginal cream Use use 0.5 gm in vagina at bedtime for 2 weeks then 2 x weekly 12/09/17   Derrek Monaco A, NP  fluvastatin (LESCOL) 20 MG capsule Take 1 capsule by mouth. Twice a week 03/18/16   [provider]  furosemide (LASIX) 20 MG tablet Take 20 mg by mouth daily.  11/13/10   [provider]  HYDROcodone-acetaminophen (NORCO/VICODIN) 5-325 MG tablet Take 1 tablet by mouth 2 (two) times daily. 04/06/16   [provider]  metoprolol tartrate (LOPRESSOR) 25 MG tablet Take 12.5 mg by mouth as needed. 07/29/17   [provider]  NON FORMULARY cbd oil    [provider]  omeprazole (PRILOSEC) 20 MG capsule Take 20 mg by mouth daily as needed. For acid reflux    [provider]  primidone (MYSOLINE) 50 MG tablet TAKE 1 TABLET BY MOUTH EVERYDAY AT BEDTIME 11/18/17   Tat, Eustace Quail, DO  Vitamin D, Ergocalciferol, (DRISDOL) 50000 units CAPS capsule Take 50,000 Units by mouth once a week. Mondays 10/09/15   [provider]    Family History Family History  Problem Relation Age of Onset  . Cancer Brother 60       gastric  . Heart attack Mother   . Colon cancer Neg Hx   . Colon polyps Neg Hx     Social History Social History   Tobacco Use  . Smoking status: Former Smoker    Packs/day: 0.25    Years: 30.00    Pack years: 7.50    Types: Cigarettes    Last attempt to quit: 08/19/2000    Years since quitting: 17.5  . Smokeless tobacco: Never Used  . Tobacco comment: "smoked but not inhaled"  Substance Use Topics  . Alcohol use: No    Alcohol/week: 0.0 standard drinks  . Drug use: No     Allergies   Morphine and related   Review of Systems Review of Systems  All systems reviewed and negative, other than as noted in HPI.  Physical Exam Updated Vital Signs BP (!) 172/94 (BP Location: Right Arm)   Pulse 86   Temp 98.6 F (37 C) (Oral)   Resp 12   Ht 5\' 7"  (1.702 m)   Wt 83 kg   SpO2 96%   BMI 28.66 kg/m    Physical Exam  Constitutional: She is oriented to person, place, and time. She appears well-developed and well-nourished. No distress.  HENT:  Head: Normocephalic and atraumatic.  Eyes: Pupils are equal, round, and reactive to light. Conjunctivae and EOM are normal. Right eye exhibits no discharge. Left eye exhibits no discharge.  Neck: Neck supple.  Cardiovascular: Normal rate, regular rhythm and normal heart sounds.  Exam reveals no gallop and no friction rub.  No murmur heard. Pulmonary/Chest: Effort normal and breath sounds normal. No respiratory distress.  Abdominal: Soft. She exhibits no distension. There is no tenderness.  Musculoskeletal: She exhibits no edema or tenderness.  Neurological: She is alert and oriented to person, place, and time. No cranial nerve deficit. She exhibits normal muscle tone. Coordination normal.  Skin: Skin is warm and dry.  Psychiatric: She has a normal mood and affect. Her behavior is normal. Thought content normal.  Nursing note and vitals reviewed.    ED Treatments / Results  Labs (all labs ordered are listed, but only abnormal results are displayed) Labs Reviewed  BASIC METABOLIC PANEL - Abnormal; Notable for the following components:      Result Value   Glucose, Bld 101 (*)    All other components within normal limits  CBC  TROPONIN I    EKG None  Radiology No results found.  Procedures Procedures (including critical care time)  Medications Ordered in ED Medications - No data to display   Initial Impression / Assessment and Plan / ED Course  I have reviewed the triage vital signs and the nursing notes.  Pertinent labs & imaging results that were available during my care of the patient were reviewed by me and considered in my medical decision making (see chart for details).     I have reviewed the triage vital signs and the nursing notes. Prior records were reviewed for additional information.    Pertinent labs & imaging  results that were available during my care of the patient were reviewed by me and considered in my medical decision making (see chart for details).  72 year old female with headache.  Unsure blood pressure is directly related.  Patient states that she does not check her blood pressure regularly.  She only does not sporadically when she may not be feeling well.  Her neuro exam is nonfocal.  She has no acute complaints aside from mild headache.  It is now much improved.  I doubt that this actually represents endorgan damage related to her hypertension.  Advised to keep her regular log of her blood pressure.  She is to follow-up with her PCP if she has consistently elevated pressures.  Emergent return precautions were discussed regards to her headache or otherwise.  Final Clinical Impressions(s) / ED Diagnoses   Final diagnoses:  Nonintractable headache, unspecified chronicity pattern, unspecified headache type  Essential hypertension    ED Discharge Orders    None       Virgel Manifold, MD 03/10/18 [phone]

## 2018-03-02 NOTE — ED Triage Notes (Signed)
Bp 180's sys today.  Reports having a headache.

## 2018-03-12 ENCOUNTER — Other Ambulatory Visit (HOSPITAL_COMMUNITY): Payer: Self-pay | Admitting: Internal Medicine

## 2018-03-12 DIAGNOSIS — Z1231 Encounter for screening mammogram for malignant neoplasm of breast: Secondary | ICD-10-CM

## 2018-03-25 ENCOUNTER — Encounter

## 2018-03-25 ENCOUNTER — Ambulatory Visit: Payer: Medicare Other | Admitting: Neurology

## 2018-04-07 ENCOUNTER — Encounter: Payer: Self-pay | Admitting: Internal Medicine

## 2018-04-07 ENCOUNTER — Encounter

## 2018-04-07 ENCOUNTER — Ambulatory Visit: Payer: Medicare Other | Admitting: Internal Medicine

## 2018-04-07 ENCOUNTER — Other Ambulatory Visit: Payer: Self-pay

## 2018-04-07 VITALS — BP 118/73 | HR 74 | Temp 97.0°F | Ht 67.0 in | Wt 174.0 lb

## 2018-04-07 DIAGNOSIS — K219 Gastro-esophageal reflux disease without esophagitis: Secondary | ICD-10-CM | POA: Diagnosis not present

## 2018-04-07 DIAGNOSIS — K580 Irritable bowel syndrome with diarrhea: Secondary | ICD-10-CM | POA: Diagnosis not present

## 2018-04-07 MED ORDER — HYOSCYAMINE SULFATE 0.125 MG SL SUBL: 0.1250 mg | SUBLINGUAL_TABLET | SUBLINGUAL | 3 refills | Status: DC | PRN

## 2018-04-07 NOTE — Patient Instructions (Signed)
Information on IBS provided  Try Levsin .125mg  tablets - one under the tongue and at bedtime as needed for abdominal cramps and diarrhea (disp 40 with 3 refills) - [facility]  Office visit with [place] in 3 months

## 2018-04-07 NOTE — Progress Notes (Signed)
Primary Care Physician:  Tisovec, Fransico Him, MD Primary Gastroenterologist:  Dr. Gala Romney  Pre-Procedure History & Physical: HPI:  [name] is a 72 y.o. female here for follow-up of altered bowel habits.  The patient with chronic intermittent postprandial abdominal cramps nonbloody diarrhea.  She is under a great deal of stress dealing with her husband's health issues.  No melena or rectal bleeding.  Colonoscopy 2 years ago demonstrated a benign ileocecal valve ulcer felt to be NSAID related; history of colonic polyps; due for 1 more surveillance colonoscopy 3 years from now.  GERD well-controlled on omeprazole 20 mg daily.  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  Seen by Dr. Dalbert Batman previously.  Follow conservatively.  Denies nausea or vomiting.  Past Medical History:  Diagnosis Date  . Anxiety   . Arthritis   . Bronchiolitis    acute  . Chronic back pain   . Depression   . Diverticulosis   . GERD (gastroesophageal reflux disease)    negative H pylori stool Ag  . High cholesterol   . HTN (hypertension)   . Hyperplastic colon polyp 11/08/08  . IBS (irritable bowel syndrome)   . Tubular adenoma of colon   . Umbilical hernia   . Wears glasses     Past Surgical History:  Procedure Laterality Date  . ABDOMINAL HYSTERECTOMY  1985  . APPENDECTOMY    . BIOPSY  05/09/2016   Procedure: BIOPSY;  Surgeon: Daneil Dolin, MD;  [facility];  Service: Endoscopy;;  bx ulcer at ileocecal valve  . BREAST SURGERY  1989   both sides  . CARPAL TUNNEL RELEASE Right 08/23/2013   Procedure: RIGHT ENDOSCOPIC CARPAL  TUNNEL RELEASE;  Surgeon: Jolyn Nap, MD;  Location: [place];  Service: Orthopedics;  Laterality: Right;  . CATARACT EXTRACTION, BILATERAL    . [place] SURGERY  2007  . CHOLECYSTECTOMY  1980  . COLONOSCOPY  11/08/08   Dr. Gala Romney- hemorrhoids, pancolonic diverticula,,hyperplastic polyp  . COLONOSCOPY    08/05/2005   JAS:[id number] rectum/Diminutive polyp at 44 cm/Pancolonic diverticula/benign-appearing ulcer in the mid descending colon  . COLONOSCOPY N/A 03/30/2013   QBH:ALPFXTK anal canal hemorrhoids. Single colonic polyp-removed (tubular adenoma). Colonic diverticulosis.  . COLONOSCOPY N/A 05/09/2016   Procedure: COLONOSCOPY;  Surgeon: Daneil Dolin, MD;  [facility];  Service: Endoscopy;  Laterality: N/A;  1045  . DORSAL COMPARTMENT RELEASE Left 12/12/2014   Procedure: LEFT WRIST RELEASE DORSAL COMPARTMENT (DEQUERVAIN);  Surgeon: Milly Jakob, MD;  Location: [place];  Service: Orthopedics;  Laterality: Left;  . ECTOPIC PREGNANCY SURGERY     bilat  . FOOT SURGERY Right 02/2017  . NEPHRECTOMY  1975   left-infection  . TRIGGER FINGER RELEASE Right 08/23/2013   Procedure: RIGHT INDEX FINGER TRIGGER RELEASE ;  Surgeon: Jolyn Nap, MD;  Location: [place];  Service: Orthopedics;  Laterality: Right;    Prior to Admission medications   Medication Sig Start Date End Date Taking? Authorizing Provider  amLODipine-valsartan (EXFORGE) 5-160 MG per tablet Take 1 tablet by mouth daily.   Yes [provider]  conjugated estrogens (PREMARIN) vaginal cream Use use 0.5 gm in vagina at bedtime for 2 weeks then 2 x weekly Patient taking differently: Use use 0.5 gm in vagina at bedtime for 2 weeks then 2 x weekly as needed 12/09/17  Yes Estill Dooms, NP  fluvastatin (LESCOL) 20 MG capsule Take 1  capsule by mouth 2 (two) times a week. Twice a week 03/18/16  Yes [provider]  furosemide (LASIX) 20 MG tablet Take 20 mg by mouth daily.  11/13/10  Yes [provider]  HYDROcodone-acetaminophen (NORCO/VICODIN) 5-325 MG tablet Take 1 tablet by mouth 2 (two) times daily. 04/06/16  Yes [provider]  metoprolol tartrate (LOPRESSOR) 25 MG tablet Take 12.5 mg by mouth daily as needed (for anxiety).  07/29/17  Yes [provider]  omeprazole (PRILOSEC) 20 MG capsule Take 20 mg by mouth daily as needed. For acid reflux   Yes [provider]  primidone (MYSOLINE) 50 MG tablet TAKE 1 TABLET BY MOUTH EVERYDAY AT BEDTIME Patient taking differently: Take 50 mg by mouth 2 (two) times daily.  11/18/17  Yes Tat, Eustace Quail, DO  SYMBICORT 160-4.5 MCG/ACT inhaler Inhale 2 puffs into the lungs 2 (two) times daily as needed (for breathing).  01/13/18  Yes [provider]  Vitamin D, Ergocalciferol, (DRISDOL) 50000 units CAPS capsule Take 50,000 Units by mouth once a week. Mondays 10/09/15  Yes [provider]    Allergies as of 04/07/2018 - Review Complete 04/07/2018  Allergen Reaction Noted  . Morphine and related Hypertension 11/11/2013    Family History  Problem Relation Age of Onset  . Cancer Brother 60       gastric  . Heart attack Mother   . Colon cancer Neg Hx   . Colon polyps Neg Hx     Social History   Socioeconomic History  . Marital status: Married    Spouse name: [name]  . Number of children: 1  . Years of education: some coll.  . Highest education level: Not on file  Occupational History  . 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 Financial resource strain: Not on file  . Food insecurity:    Worry: Not on file    Inability: Not on file  . Transportation needs:    Medical: Not on file    Non-medical: Not on file  Tobacco Use  . Smoking status: Former Smoker    Packs/day: 0.25    Years: 30.00    Pack years: 7.50    Types: Cigarettes    Last attempt to quit: 08/19/2000    Years since quitting: 17.6  . Smokeless tobacco: Never Used  . Tobacco comment: "smoked but not inhaled"  Substance and Sexual Activity  . Alcohol use: No    Alcohol/week: 0.0 standard drinks  . Drug use: No  . Sexual activity: Not Currently    Birth control/protection: Surgical    Comment: hyst  Lifestyle  . Physical activity:    Days  per week: Not on file    Minutes per session: Not on file  . Stress: Not on file  Relationships  . Social connections:    Talks on phone: Not on file    Gets together: Not on file    Attends religious service: Not on file    Active member of club or organization: Not on file    Attends meetings of clubs or organizations: Not on file    Relationship status: Not on file  . Intimate partner violence:    Fear of current or ex partner: Not on file    Emotionally abused: Not on file    Physically abused: Not on file    Forced sexual activity: Not on file  Other Topics Concern  .  Not on file  Social History Narrative   Consumes 2 cups of caffeine daily    Review of Systems: See HPI, otherwise negative ROS  Physical Exam: BP 118/73   Pulse 74   Temp (!) 97 F (36.1 C) (Oral)   Ht 5\' 7"  (1.702 m)   Wt 174 lb (78.9 kg)   BMI 27.25 kg/m  General:   Alert,  Well-developed, well-nourished, pleasant and cooperative in NAD Skin:  Intact without significant lesions or rashes. Lungs:  Clear throughout to auscultation.   No wheezes, crackles, or rhonchi. No acute distress. Heart:  Regular rate and rhythm; no murmurs, clicks, rubs,  or gallops. Abdomen: Non-distended, normal bowel sounds.  Soft and nontender without appreciable mass or hepatosplenomegaly.  Pulses:  Normal pulses noted. Extremities:  Without clubbing or edema.  Impression/Plan: 72 year old lady with intermittent postprandial abdominal cramps and diarrhea most likely exacerbation of her IBS which she has had for years.  Situational stress no doubt worsening her symptoms at this time. GERD well-controlled on omeprazole   Recommendations:  History of colonic polyps-due for surveillance colonoscopy 2022  Recommendations informational irritable bowel syndrome provided  Effective stress management recommended  Trial of Levsin 0.125 mg tablets 1 sublingually before meals and at bedtime as needed for abdominal cramps and  diarrhea  Office visit here in 3 months.     Notice: This dictation was prepared with Dragon dictation along with smaller phrase technology. Any transcriptional errors that result from this process are unintentional and may not be corrected upon review.

## 2018-04-20 ENCOUNTER — Encounter (HOSPITAL_COMMUNITY): Payer: Self-pay

## 2018-04-20 ENCOUNTER — Ambulatory Visit (HOSPITAL_COMMUNITY)
Admission: RE | Admit: 2018-04-20 | Discharge: 2018-04-20 | Disposition: A | Discharge status: Home / Self Care | Payer: Medicare Other | Source: Ambulatory Visit | Attending: Internal Medicine | Admitting: Internal Medicine

## 2018-04-20 DIAGNOSIS — Z1231 Encounter for screening mammogram for malignant neoplasm of breast: Secondary | ICD-10-CM | POA: Diagnosis present

## 2018-05-05 ENCOUNTER — Other Ambulatory Visit: Payer: Self-pay | Admitting: Neurology

## 2018-06-15 NOTE — Progress Notes (Deleted)
[name] was seen today in the movement disorders clinic for neurologic consultation at the request of Tisovec, Fransico Him, MD.  The consultation is for the evaluation of PD.  The records that were made available to me were reviewed.  Pt has previously seen Dr. Rexene Alberts, last in June, 2016.  She saw Dr. Merlene Laughter prior to Dr. Rexene Alberts.  Patient reports that her first symptom was tremor, which started in approximately 2015.  This was right hand tremor, but primarily was an action tremor and not a rest tremor.  She did fall in 2015 after tripping over a chair.  This resulted in a fractured left fourth toe.  She saw Dr. Merlene Laughter at the end of 2015 and was given a prescription for carbidopa/levodopa 25/100 and was to titrate up to 3 times per day.  The patient reports that she could not tolerate it and felt depressed.  She took 1 pill once per day for 1-1/2 weeks and then stopped the medication.  She reports today that she had no SE with the medication; she just didn't want to take the medication.  When she saw Dr. Rexene Alberts in February, 2016, Dr. Rexene Alberts told her that she did not see evidence of Parkinson's disease, but rather just tremor.  She followed back up with Dr. Rexene Alberts in June, 2016.  Her examination was stable and it was felt that she did not have Parkinson's disease.  She went back to Dr. Merlene Laughter and saw him a few times.    Tremor: Yes.    At rest or with activation?  With activation.  Never tremors at rest Fam hx of tremor?  No. Located where?  R hand, worse when gets nervous Affected by caffeine:  No. (1 cup coffee per day) Affected by alcohol: doesn't drink Affected by stress:  Yes.   Affected by fatigue:  No., but will get worse with hunger Spills soup if on spoon:  Yes.  , if uses the R hand (but is L handed so doesn't use it much) Affects ADL's (tying shoes, brushing teeth, etc):  No.   Specific Symptoms:  Voice: no change Sleep: stays up really late, but sleeps well when she goes to  sleep  Vivid Dreams:  No.  Acting out dreams:  No. (used to have nightmares but not any longer) Wet Pillows: No. Postural symptoms:  No.  Falls?  No. Bradykinesia symptoms: difficulty getting out of a chair (due to knee and hip and low back pain) Loss of smell:  No. Loss of taste:  No. Urinary Incontinence:  No. Difficulty Swallowing:  No. Handwriting, micrographia: No. Depression:  "I can't really tell"; husband has some medical problems and been stressful Memory changes:  Yes.  - thinks that is from stress/"overload" Hallucinations:  No.  visual distortions: No. N/V:  No. Lightheaded:  No.  Syncope: No. Diplopia:  No. Dyskinesia:  No.  Neuroimaging of the brain has not been performed in the recent years.  CT of the brain was performed in March, 2008 and was unremarkable.    PREVIOUS MEDICATIONS: less than a week of carbidopa/levodopa, taken once per day.  Stopped it as didn't feel that medication was necessary  12/16/17 update: Patient is seen today in follow-up for essential tremor.  She was started on primidone last visit.  She thinks that med is helpful but still has some tremor.  Anxiety and rushing makes tremor worse.  No SE.  no falls since our last visit.    06/17/18  update: Patient is seen today in follow-up for essential tremor.  Her primidone was increased last visit to 50 mg twice per day.  However, she actually never did that.  She ended up calling me in early July to state that tremors were getting worse because she was under a lot more stress.  She had an episode of what she described as almost "passing out" because she was worried about her family member who was in the hospital.  Once she sat down and calmed down, she felt much better and was okay.  However, the next morning she was more shaky and jittery.  She was encouraged by [place] to seek counseling to deal with her anxiety or to talk with her primary care physician about ways to deal with her anxiety, which she has  expressed in the past have been an issue.  Records are reviewed since last visit.  She saw gastroenterology on October 1 relating to irritable bowel and it was felt that situational stress was worsening those symptoms as well.  ALLERGIES:   Allergies  Allergen Reactions  . Morphine And Related Hypertension    CURRENT MEDICATIONS:  Outpatient Encounter Medications as of 06/17/2018  Medication Sig  . amLODipine-valsartan (EXFORGE) 5-160 MG per tablet Take 1 tablet by mouth daily.  [name] conjugated estrogens (PREMARIN) vaginal cream Use use 0.5 gm in vagina at bedtime for 2 weeks then 2 x weekly (Patient taking differently: Use use 0.5 gm in vagina at bedtime for 2 weeks then 2 x weekly as needed)  . fluvastatin (LESCOL) 20 MG capsule Take 1 capsule by mouth 2 (two) times a week. Twice a week  . furosemide (LASIX) 20 MG tablet Take 20 mg by mouth daily.   [name] HYDROcodone-acetaminophen (NORCO/VICODIN) 5-325 MG tablet Take 1 tablet by mouth 2 (two) times daily.  . hyoscyamine (LEVSIN SL) 0.125 MG SL tablet Place 1 tablet (0.125 mg total) under the tongue every 4 (four) hours as needed. One under the tongue q 4 hrs as needed and at bedtime.  . metoprolol tartrate (LOPRESSOR) 25 MG tablet Take 12.5 mg by mouth daily as needed (for anxiety).   [name] omeprazole (PRILOSEC) 20 MG capsule Take 20 mg by mouth daily as needed. For acid reflux  . primidone (MYSOLINE) 50 MG tablet Take 1 tablet (50 mg total) by mouth 2 (two) times daily.  . SYMBICORT 160-4.5 MCG/ACT inhaler Inhale 2 puffs into the lungs 2 (two) times daily as needed (for breathing).   . Vitamin D, Ergocalciferol, (DRISDOL) 50000 units CAPS capsule Take 50,000 Units by mouth once a week. Mondays   No facility-administered encounter medications on file as of 06/17/2018.     PAST MEDICAL HISTORY:   Past Medical History:  Diagnosis Date  . Anxiety   . Arthritis   . Bronchiolitis    acute  . Chronic back pain   . Depression   . Diverticulosis     . GERD (gastroesophageal reflux disease)    negative H pylori stool Ag  . High cholesterol   . HTN (hypertension)   . Hyperplastic colon polyp 11/08/08  . IBS (irritable bowel syndrome)   . Tubular adenoma of colon   . Umbilical hernia   . Wears glasses     PAST SURGICAL HISTORY:   Past Surgical History:  Procedure Laterality Date  . ABDOMINAL HYSTERECTOMY  1985  . APPENDECTOMY    . BIOPSY  05/09/2016   Procedure: BIOPSY;  Surgeon: Daneil Dolin, MD;  [facility];  Service: Endoscopy;;  bx ulcer at ileocecal valve  . BREAST SURGERY  1989   both sides  . CARPAL TUNNEL RELEASE Right 08/23/2013   Procedure: RIGHT ENDOSCOPIC CARPAL  TUNNEL RELEASE;  Surgeon: Jolyn Nap, MD;  Location: [place];  Service: Orthopedics;  Laterality: Right;  . CATARACT EXTRACTION, BILATERAL    . [place] SURGERY  2007  . CHOLECYSTECTOMY  1980  . COLONOSCOPY  11/08/08   Dr. Gala Romney- hemorrhoids, pancolonic diverticula,,hyperplastic polyp  . COLONOSCOPY   08/05/2005   [id number] rectum/Diminutive polyp at 66 cm/Pancolonic diverticula/benign-appearing ulcer in the mid descending colon  . COLONOSCOPY N/A 03/30/2013   [id number] anal canal hemorrhoids. Single colonic polyp-removed (tubular adenoma). Colonic diverticulosis.  . COLONOSCOPY N/A 05/09/2016   Procedure: COLONOSCOPY;  Surgeon: Daneil Dolin, MD;  [facility];  Service: Endoscopy;  Laterality: N/A;  1045  . DORSAL COMPARTMENT RELEASE Left 12/12/2014   Procedure: LEFT WRIST RELEASE DORSAL COMPARTMENT (DEQUERVAIN);  Surgeon: Milly Jakob, MD;  Location: [place];  Service: Orthopedics;  Laterality: Left;  . ECTOPIC PREGNANCY SURGERY     bilat  . FOOT SURGERY Right 02/2017  . NEPHRECTOMY  1975   left-infection  . TRIGGER FINGER RELEASE Right 08/23/2013   Procedure: RIGHT INDEX FINGER TRIGGER RELEASE ;  Surgeon: Jolyn Nap, MD;  Location: [place];  Service:  Orthopedics;  Laterality: Right;    SOCIAL HISTORY:   Social History   Socioeconomic History  . Marital status: Married    Spouse name: [name]  . Number of children: 1  . Years of education: some coll.  . Highest education level: Not on file  Occupational History  . 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 Financial resource strain: Not on file  . Food insecurity:    Worry: Not on file    Inability: Not on file  . Transportation needs:    Medical: Not on file    Non-medical: Not on file  Tobacco Use  . Smoking status: Former Smoker    Packs/day: 0.25    Years: 30.00    Pack years: 7.50    Types: Cigarettes    Last attempt to quit: 08/19/2000    Years since quitting: 17.8  . Smokeless tobacco: Never Used  . Tobacco comment: "smoked but not inhaled"  Substance and Sexual Activity  . Alcohol use: No    Alcohol/week: 0.0 standard drinks  . Drug use: No  . Sexual activity: Not Currently    Birth control/protection: Surgical    Comment: hyst  Lifestyle  . Physical activity:    Days per week: Not on file    Minutes per session: Not on file  . Stress: Not on file  Relationships  . Social connections:    Talks on phone: Not on file    Gets together: Not on file    Attends religious service: Not on file    Active member of club or organization: Not on file    Attends meetings of clubs or organizations: Not on file    Relationship status: Not on file  . Intimate partner violence:    Fear of current or ex partner: Not on file    Emotionally abused: Not on file    Physically abused: Not on file    Forced sexual activity: Not on file  Other Topics Concern  . Not on file  Social History Narrative   Consumes 2 cups of caffeine daily    FAMILY HISTORY:   Family Status  Relation Name Status  . Brother  Deceased       stomach CA  . Mother  Deceased at age 25       heart attack  . Father  Deceased       unknown,as a  child  . Sister x2 Alive  . Son  Alive  . Neg Hx  (Not Specified)    ROS:  ROS   PHYSICAL EXAMINATION:    VITALS:   There were no vitals filed for this visit.  GEN:  The patient appears stated age and is in NAD. HEENT:  Normocephalic, atraumatic.  The mucous membranes are moist. The superficial temporal arteries are without ropiness or tenderness. CV:  RRR Lungs:  CTAB Neck/HEME:  There are no carotid bruits bilaterally.  Neurological examination:  Orientation: The patient is alert and oriented x3. Cranial nerves: There is good facial symmetry. The speech is fluent and clear. Soft palate rises symmetrically and there is no tongue deviation. Hearing is intact to conversational tone. Sensation: Sensation is intact to light touch throughout Motor: Strength is 5/5 in the bilateral upper and lower extremities.   Shoulder shrug is equal and symmetric.  There is no pronator drift.  Movement examination: Tone: There is normal tone in the RUE.  There is normal tone in the LUE.  There is normal tone in the RLE.  There is normal tone in the LLE.  Abnormal movements: There is R>LUE intention tremor.  Has trouble with archimedes spirals on the right Coordination:  There is no decremation with RAM's, with any form of RAMS, including alternating supination and pronation of the forearm, hand opening and closing, finger taps, heel taps and toe taps. Gait and Station: The patient has no difficulty arising out of a deep-seated chair without the use of the hands. The patient's stride length is normal.    No results found for: TSH   Chemistry      Component Value Date/Time   NA 140 03/02/2018 1605   K 3.9 03/02/2018 1605   CL 104 03/02/2018 1605   CO2 30 03/02/2018 1605   BUN 12 03/02/2018 1605   CREATININE 0.83 03/02/2018 1605      Component Value Date/Time   CALCIUM 9.5 03/02/2018 1605   ALKPHOS 75 04/07/2010 1931   AST 20 04/07/2010 1931   ALT 15 04/07/2010 1931   BILITOT 0.6  04/07/2010 1931     labs: Labs were received and dated June 17, 2017.  Sodium was 142, potassium 4.2, chloride 105, CO2 29, BUN 14 and creatinine 0.9.  AST 15, ALT 15, alkaline phosphatase 97.  ASSESSMENT/PLAN:  1.  Essential Tremor.  -increase primidone - 50 mg bid.  Risks, benefits, side effects and alternative therapies were discussed.  The opportunity to ask questions was given and they were answered to the best of my ability.  The patient expressed understanding and willingness to follow the outlined treatment protocols.  -she knows anxiety drives her tremor and she is working on recognizing that and trying techniques to improve anxiety level  2. F/u 5 months   Cc:  Tisovec, Fransico Him, MD

## 2018-06-17 ENCOUNTER — Ambulatory Visit: Payer: Medicare Other | Admitting: Neurology

## 2018-07-14 ENCOUNTER — Ambulatory Visit: Payer: Medicare Other | Admitting: Gastroenterology

## 2018-08-12 ENCOUNTER — Ambulatory Visit: Payer: Medicare Other | Admitting: Gastroenterology

## 2018-08-12 ENCOUNTER — Encounter: Payer: Self-pay | Admitting: Gastroenterology

## 2018-08-12 VITALS — BP 136/81 | HR 78 | Temp 97.8°F | Ht 67.0 in | Wt 170.8 lb

## 2018-08-12 DIAGNOSIS — K581 Irritable bowel syndrome with constipation: Secondary | ICD-10-CM

## 2018-08-12 DIAGNOSIS — K219 Gastro-esophageal reflux disease without esophagitis: Secondary | ICD-10-CM

## 2018-08-12 NOTE — Assessment & Plan Note (Addendum)
73 year old female with chronic IBS, now with more constipation-predominant symptoms. Some improvement with Benefiber sporadically and "Smooth Move". Discussed 1 tbsp daily to twice a day, with Smooth Move as needed. Chronic abdominal pain at baseline and likely multifactorial in setting of constipation, likely adhesive disease, known left lateral abdominal wall hernia. Return in 4 months or sooner as needed.

## 2018-08-12 NOTE — Assessment & Plan Note (Signed)
Continue omeprazole prn. GERD controlled with diet and behavior.

## 2018-08-12 NOTE — Progress Notes (Signed)
Referring Provider: Haywood Pao, MD Primary Care Physician:  Haywood Pao, MD  Primary GI: Dr. Gala Romney   Chief Complaint  Patient presents with  . left side pain    always gives problems and it goes into left upper quadrant   . diarrhea/constipation    when constipated she will take medication and then will have diarrhea    HPI:   [name] is a 72 y.o. female presenting today with a history of GERD, constipation, chronic abdominal pain. Known left lateral abdominal wall hernia. Colonoscopy 2017 with diverticulosis in entire colon, mucosal ulceration at IC valve felt to be related to NSAID effect, otherwise normal. Due for surveillance in 2022. Last seen in Oct 2019 with postprandial abdominal cramping and loose stool, felt IBS related in setting of stress and prescribed Levsin. Here for follow-up.   Gets constipated, then takes "smooth move" and "ran me too much". Eats prunes, but this gives her gas. Benefiber off and on. Linzess caused watery stool. BMs will be solid, then next 2-3 will be water. BM usually twice a day, but doesn't feel productive. Left-sided discomfort sometimes better after BM. Worsened with movement.   Levsin helps with cramping.   Doesn't take omeprazole every day. Changing diet has helped with GERD symptoms.   Past Medical History:  Diagnosis Date  . Anxiety   . Arthritis   . Bronchiolitis    acute  . Chronic back pain   . Depression   . Diverticulosis   . GERD (gastroesophageal reflux disease)    negative H pylori stool Ag  . High cholesterol   . HTN (hypertension)   . Hyperplastic colon polyp 11/08/08  . IBS (irritable bowel syndrome)   . Tubular adenoma of colon   . Umbilical hernia   . Wears glasses     Past Surgical History:  Procedure Laterality Date  . ABDOMINAL HYSTERECTOMY  1985  . APPENDECTOMY    . BIOPSY  05/09/2016   Procedure: BIOPSY;  Surgeon: Daneil Dolin, MD;  [facility];  Service: Endoscopy;;   bx ulcer at ileocecal valve  . BREAST SURGERY  1989   both sides  . CARPAL TUNNEL RELEASE Right 08/23/2013   Procedure: RIGHT ENDOSCOPIC CARPAL  TUNNEL RELEASE;  Surgeon: Jolyn Nap, MD;  Location: [place];  Service: Orthopedics;  Laterality: Right;  . CATARACT EXTRACTION, BILATERAL    . [place] SURGERY  2007  . CHOLECYSTECTOMY  1980  . COLONOSCOPY  11/08/08   Dr. Gala Romney- hemorrhoids, pancolonic diverticula,,hyperplastic polyp  . COLONOSCOPY   08/05/2005   [id number] rectum/Diminutive polyp at 28 cm/Pancolonic diverticula/benign-appearing ulcer in the mid descending colon  . COLONOSCOPY N/A 03/30/2013   [id number]:[id number] anal canal hemorrhoids. Single colonic polyp-removed (tubular adenoma). Colonic diverticulosis.  . COLONOSCOPY N/A 05/09/2016   Dr. Gala Romney: diverticulosis in entire colon, mucosal ulceration at IC valve felt to be related to NSAID effect, otherwise normal. due for surveillance in 2022.   . DORSAL COMPARTMENT RELEASE Left 12/12/2014   Procedure: LEFT WRIST RELEASE DORSAL COMPARTMENT (DEQUERVAIN);  Surgeon: Milly Jakob, MD;  Location: [place];  Service: Orthopedics;  Laterality: Left;  . ECTOPIC PREGNANCY SURGERY     bilat  . FOOT SURGERY Right 02/2017  . NEPHRECTOMY  1975   left-infection  . TRIGGER FINGER RELEASE Right 08/23/2013   Procedure: RIGHT INDEX FINGER TRIGGER RELEASE ;  Surgeon: Jolyn Nap, MD;  Location: [place];  Service:  Orthopedics;  Laterality: Right;    Current Outpatient Medications  Medication Sig Dispense Refill  . amLODipine-valsartan (EXFORGE) 5-160 MG per tablet Take 1 tablet by mouth daily.    [name] BIOTIN PO Take by mouth daily.    [name] conjugated estrogens (PREMARIN) vaginal cream Use use 0.5 gm in vagina at bedtime for 2 weeks then 2 x weekly (Patient taking differently: Use use 0.5 gm in vagina at bedtime for 2 weeks then 2 x weekly as needed) 42.5 g 3  . fluvastatin (LESCOL) 20 MG capsule  Take 1 capsule by mouth 2 (two) times a week. Twice a week    . furosemide (LASIX) 20 MG tablet Take 20 mg by mouth daily.     [name] HYDROcodone-acetaminophen (NORCO/VICODIN) 5-325 MG tablet Take 1 tablet by mouth 2 (two) times daily.    . hyoscyamine (LEVSIN SL) 0.125 MG SL tablet Place 1 tablet (0.125 mg total) under the tongue every 4 (four) hours as needed. One under the tongue q 4 hrs as needed and at bedtime. 40 tablet 3  . metoprolol tartrate (LOPRESSOR) 25 MG tablet Take 12.5 mg by mouth daily as needed (for anxiety).   5  . omeprazole (PRILOSEC) 20 MG capsule Take 20 mg by mouth daily as needed. For acid reflux    . primidone (MYSOLINE) 50 MG tablet Take 1 tablet (50 mg total) by mouth 2 (two) times daily. 180 tablet 0  . pyridOXINE (VITAMIN B-6) 100 MG tablet Take 100 mg by mouth daily.    . SYMBICORT 160-4.5 MCG/ACT inhaler Inhale 2 puffs into the lungs 2 (two) times daily as needed (for breathing).   1  . Vitamin D, Ergocalciferol, (DRISDOL) 50000 units CAPS capsule Take 50,000 Units by mouth once a week. Mondays  11   No current facility-administered medications for this visit.     Allergies as of 08/12/2018 - Review Complete 08/12/2018  Allergen Reaction Noted  . Morphine and related Hypertension 11/11/2013    Family History  Problem Relation Age of Onset  . Cancer Brother 60       gastric  . Heart attack Mother   . Colon cancer Neg Hx   . Colon polyps Neg Hx     Social History   Socioeconomic History  . Marital status: Married    Spouse name: [name]  . Number of children: 1  . Years of education: some coll.  . Highest education level: Not on file  Occupational History  . 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 Financial resource strain: Not on file  . Food insecurity:    Worry: Not on file    Inability: Not on file  . Transportation needs:    Medical: Not on file    Non-medical: Not on file  Tobacco Use  .  Smoking status: Former Smoker    Packs/day: 0.25    Years: 30.00    Pack years: 7.50    Types: Cigarettes    Last attempt to quit: 08/19/2000    Years since quitting: 17.9  . Smokeless tobacco: Never Used  . Tobacco comment: "smoked but not inhaled"  Substance and Sexual Activity  . Alcohol use: No    Alcohol/week: 0.0 standard drinks  . Drug use: No  . Sexual activity: Not Currently    Birth control/protection: Surgical    Comment: hyst  Lifestyle  . Physical activity:    Days per week: Not on file  Minutes per session: Not on file  . Stress: Not on file  Relationships  . Social connections:    Talks on phone: Not on file    Gets together: Not on file    Attends religious service: Not on file    Active member of club or organization: Not on file    Attends meetings of clubs or organizations: Not on file    Relationship status: Not on file  Other Topics Concern  . Not on file  Social History Narrative   Consumes 2 cups of caffeine daily    Review of Systems: Gen: Denies fever, chills, anorexia. Denies fatigue, weakness, weight loss.  CV: Denies chest pain, palpitations, syncope, peripheral edema, and claudication. Resp: Denies dyspnea at rest, cough, wheezing, coughing up blood, and pleurisy. GI: see HPI Derm: Denies rash, itching, dry skin Psych: Denies depression, anxiety, memory loss, confusion. No homicidal or suicidal ideation.  Heme: Denies bruising, bleeding, and enlarged lymph nodes.  Physical Exam: BP 136/81   Pulse 78   Temp 97.8 F (36.6 C) (Oral)   Ht 5\' 7"  (1.702 m)   Wt 170 lb 12.8 oz (77.5 kg)   BMI 26.75 kg/m  General:   Alert and oriented. No distress noted. Pleasant and cooperative.  Head:  Normocephalic and atraumatic. Eyes:  Conjuctiva clear without scleral icterus. Mouth:  Oral mucosa pink and moist.  Abdomen:  +BS, soft, non-tender and non-distended. No rebound or guarding. No HSM or masses noted. Msk:  Symmetrical without gross  deformities. Normal posture. Extremities:  Without edema. Neurologic:  Alert and  oriented x4 Psych:  Alert and cooperative. Normal mood and affect.

## 2018-08-12 NOTE — Patient Instructions (Signed)
Let's try the Benefiber 1 tbsp daily to twice a day.   You can use the Smooth Move as needed.  Please call me if this doesn't help to regulate some of the bowel issues.  I will see you in 4 months otherwise!  It was a pleasure to see you today. I strive to create trusting relationships with patients to provide genuine, compassionate, and quality care. I value your feedback. If you receive a survey regarding your visit,  I greatly appreciate you taking time to fill this out.   Annitta Needs, PhD, ANP-BC [facility]

## 2018-08-12 NOTE — Progress Notes (Signed)
CC'ED TO PCP 

## 2018-08-17 ENCOUNTER — Telehealth: Payer: Self-pay | Admitting: Internal Medicine

## 2018-08-17 DIAGNOSIS — R109 Unspecified abdominal pain: Secondary | ICD-10-CM

## 2018-08-17 NOTE — Telephone Encounter (Signed)
[phone] PLEASE CALL PATIENT, SHE IS NOT ANY BETTER SINCE HER LAST VISIT.

## 2018-08-17 NOTE — Telephone Encounter (Signed)
Pt said should she go back to her PCP. She said when she isn't moving, the pain isn't as severe. She's going to continue to monitor her pain and if she needs to go to the ED, she will.

## 2018-08-17 NOTE — Telephone Encounter (Signed)
Agree. If her pain is that severe, she needs to seek medical care.

## 2018-08-17 NOTE — Telephone Encounter (Signed)
Spoke with pt. She noticed that her pain started increasing on yesterday. She feels her pain level is at 10 with 10 being the hight level. She is taking the Benefiber at night and woke up with severe cramping this morning with diarrhea. Pt is aware that if her pain level is at a 10, she should be evaluated by the ED.

## 2018-08-21 NOTE — Telephone Encounter (Signed)
Noted, pt wants to do the CT scan on Tuesday. Pt is aware that we will call her on Monday to schedule.

## 2018-08-21 NOTE — Telephone Encounter (Signed)
Recommend CT abd/pelvis with contrast next week, Monday or Tuesday at latest. If worsens over weekend, seek urgent evaluation. Back diet down to soft foods, full liquids.

## 2018-08-21 NOTE — Telephone Encounter (Signed)
Pt called her PCP and spoke with them about the abdomen pain and they asked her to contact our office. Pt says this will be the second week that she has been having the pain. She thought that it could have been food related at first but isn't sure. Pt says when the pain happens, she's doubled over in pain. She knows that she mentioned not doing the TCS because the liquid is hard to drink. Please advise your thoughts. Her pain level is a 5 if she doesn't move but if she moves quickly the pain grabs her and she has to stop. Pt takes Levsin.

## 2018-08-24 ENCOUNTER — Other Ambulatory Visit (HOSPITAL_COMMUNITY)
Admission: RE | Admit: 2018-08-24 | Discharge: 2018-08-24 | Disposition: A | Discharge status: Home / Self Care | Payer: Medicare Other | Source: Ambulatory Visit | Attending: Gastroenterology | Admitting: Gastroenterology

## 2018-08-24 DIAGNOSIS — R109 Unspecified abdominal pain: Secondary | ICD-10-CM | POA: Diagnosis present

## 2018-08-24 LAB — CREATININE, SERUM
Creatinine, Ser: 0.91 mg/dL (ref 0.44–1.00)
GFR calc Af Amer: >60 mL/min (ref >60)
GFR calc non Af Amer: >60 mL/min (ref >60)

## 2018-08-24 LAB — BUN: BUN: 13 mg/dL (ref 8–23)

## 2018-08-24 NOTE — Telephone Encounter (Signed)
CT abd/pelvis w/contrast scheduled for 08/25/18 at 2:30pm, arrive at 2:15pm. NPO 4 hours prior to test, pick-up contrast today. Called and informed pt of CT appt. BUN/creat order faxed to [facility] lab. She will pick up contrast and have lab work done today. She is aware she will be a work-in tomorrow for CT.   No PA needed for CT per [facility] website.

## 2018-08-24 NOTE — Addendum Note (Signed)
Addended by: Hassan Rowan on: 08/24/2018 08:21 AM   Modules accepted: Orders

## 2018-08-25 ENCOUNTER — Ambulatory Visit (HOSPITAL_COMMUNITY)
Admission: RE | Admit: 2018-08-25 | Discharge: 2018-08-25 | Disposition: A | Discharge status: Home / Self Care | Payer: Medicare Other | Source: Ambulatory Visit | Attending: Gastroenterology | Admitting: Gastroenterology

## 2018-08-25 ENCOUNTER — Telehealth: Payer: Self-pay | Admitting: Internal Medicine

## 2018-08-25 DIAGNOSIS — R109 Unspecified abdominal pain: Secondary | ICD-10-CM | POA: Insufficient documentation

## 2018-08-25 MED ORDER — IOHEXOL 300 MG/ML  SOLN
100.0000 mL | Freq: Once | INTRAMUSCULAR | Status: AC | PRN
  Administered 2018-08-25: 100 mL via INTRAVENOUS

## 2018-08-25 NOTE — Telephone Encounter (Signed)
Pt has questions about a medication she wants to take prior to her CT this afternoon and also has questions about what all the CT of abdomin and pelvis will show and what are they looking for, etc. Please call her at [phone]

## 2018-08-25 NOTE — Telephone Encounter (Signed)
Spoke with pt. She was nervous about her procedure and she was wanted to know if she could take her Metoprolol. Pt is aware that it is ok to take medication. Pt felt a little better after talking with me about her CT scan.

## 2018-08-26 NOTE — Progress Notes (Signed)
No acute findings. She does have moderate to severe scoliosis, DDD, worse in lumbar regions, degenerative changes of bilateral hips? I suggest we refer to Pain Management. Please also send copy of CT to PCP. We need to make sure constipation is well managed as this could contribute to her pain.

## 2018-08-27 ENCOUNTER — Other Ambulatory Visit: Payer: Self-pay | Admitting: *Deleted

## 2018-08-27 DIAGNOSIS — R52 Pain, unspecified: Secondary | ICD-10-CM

## 2018-09-01 ENCOUNTER — Telehealth: Payer: Self-pay | Admitting: Internal Medicine

## 2018-09-01 ENCOUNTER — Telehealth: Payer: Self-pay | Admitting: *Deleted

## 2018-09-01 NOTE — Telephone Encounter (Signed)
Patient aware PCP has to send referral.

## 2018-09-01 NOTE — Telephone Encounter (Signed)
Received fax from pain management that referral needs to come from PCP. I called patient and she will have PCP do referral. FYI to AB.

## 2018-09-01 NOTE — Telephone Encounter (Signed)
Patient called and said dr Osborne Casco said it was ok for [place] to refer patient to pain management

## 2018-09-09 ENCOUNTER — Telehealth: Payer: Self-pay | Admitting: Internal Medicine

## 2018-09-09 NOTE — Telephone Encounter (Signed)
Pt called (tearful) on the phone. She would like for the nurse to call her regarding her going to a pain clinic and hernia. I made her an appointment to come in Monday at 130pm to see AB. Please call her at [phone]

## 2018-09-09 NOTE — Telephone Encounter (Signed)
I don't understand why Pain Management will not accept our referral, when we have made multiple referrals for others without a problem. Is this an insurance issue? ([facility] clinical pool)   Let's have her pick up Amitiza 8 mcg samples. Take twice a day with food. She has baseline constipation, and she has noted some mild improvement with pain after a good bowel movement.   CT was unrevealing. I believe this is musculoskeletal. She has dealt with this chronically and was evaluated by Dr. Dalbert Batman in July 2017 due to left flank pain and known left flank incisional hernia. It was felt best not to operate at that time and have further evaluation with spine specialist. MRI lumbar spine, thoracic is on file from 2017. IF spine specialist felt that her disease was not culprit for pain, she could return to see Dr. Rosendo Gros for consideration of hernia repair, although this may not relieve pain.   Let's refer her back to a spine specialist. I am not sure who she was seeing, but the last MRI was ordered by Dr. Annette Stable. Let's check with her to see who she has seen before.

## 2018-09-09 NOTE — Telephone Encounter (Signed)
AB, I need help with this pt. She is in pain was asked to see pain management and the referral per MS note had to come from her PCP. Pt talked with PCP but was told if she goes through [facility], she can't get any pain medication. Pt saw Dr. Osborne Casco today and was told to come back to [facility]. They were able to see that [facility] wanted pt reffered for pain management.    PT slept well last night until she turned on her side. She stated the pain was bad.

## 2018-09-10 NOTE — Telephone Encounter (Signed)
I am hoping that with Amitiza she will have better regularity, predictability, and productive stool. We can sort out when I see her next week, but I would like for her to get started with it as soon as possible.

## 2018-09-10 NOTE — Telephone Encounter (Signed)
Noted. Pt is aware and will try Amitiza samples until apt.

## 2018-09-10 NOTE — Telephone Encounter (Signed)
Spoke with pt. She remembers seeing Dr. Annette Stable and believes he referred her to someone to look at her hernia. Dr. Annette Stable believed her symptoms were coming from her hernia. I will get samples of Amitiza ready for her.   Pt reports that after she spoke with me yesterday, she ate and her pain started immediately after eating. After she had a bowel movement, it ease up some. She then had a second bowel movement which pt reports was diarrhea. The diarrhea wasn't as bad in the morning than it was in the evening. After eating the evening meal, the diarrhea was at it's worse with stomach pain. Pt say you can she her stomach making noise before she has to have the bowel movement.

## 2018-09-14 ENCOUNTER — Encounter: Payer: Self-pay | Admitting: Gastroenterology

## 2018-09-14 ENCOUNTER — Other Ambulatory Visit: Payer: Self-pay | Admitting: *Deleted

## 2018-09-14 ENCOUNTER — Ambulatory Visit: Payer: Medicare Other | Admitting: Gastroenterology

## 2018-09-14 VITALS — BP 138/89 | HR 68 | Temp 97.7°F | Ht 67.0 in | Wt 169.8 lb

## 2018-09-14 DIAGNOSIS — R109 Unspecified abdominal pain: Secondary | ICD-10-CM | POA: Diagnosis not present

## 2018-09-14 DIAGNOSIS — K439 Ventral hernia without obstruction or gangrene: Secondary | ICD-10-CM

## 2018-09-14 NOTE — Assessment & Plan Note (Signed)
73 year old female with long-standing left-sided abdominal/back pain with several CTs on file, evaluated by Dr. Dalbert Batman in 2017 due to left lateral abdominal wall hernia s/p nephrectomy in the 1970s and recommending spine specialist evaluation. Patient tells me this resulted in recommendations to return to Surgery. We are attempting to achieve an adequate bowel regimen in case constipation is playing any role. Symptoms are provoked by movement. I have attempted referral to Pain Management, but this seems to need a PCP referral. I also discussed referral back to neurosurgical specialty and would benefit from updated MRIs of spine due to known disease (see HPI). She would like to pursue evaluation by surgery first. She may benefit from PT as well.  Referral to Dr. Rosendo Gros per plan outlined by Dr. Dalbert Batman in 2017 Discussed with patient she may ultimately need neurosurgical evaluation again, which could be contributing/causing her symptoms. Decrease Amitiza to 8 mcg with dinner: patient to call with how this works Return in 3-4 months for close follow-up.

## 2018-09-14 NOTE — Patient Instructions (Signed)
I am referring you to Dr. Rosendo Gros, who is with [facility].  Please let me know if you decide to be move forward with a referral to a spine specialist, as I feel you likely need updated imaging and evaluation.  Take Amitiza one gelcap with dinner. Let's see if this helps to decrease looser stool. Please keep [place] updated!  We will see you in 3-4 months!  I enjoyed seeing you again today! As you know, I value our relationship and want to provide genuine, compassionate, and quality care. I welcome your feedback. If you receive a survey regarding your visit,  I greatly appreciate you taking time to fill this out. See you next time!  Annitta Needs, PhD, ANP-BC [facility]

## 2018-09-14 NOTE — Progress Notes (Signed)
Referring Provider: Haywood Pao, MD Primary Care Physician:  Haywood Pao, MD Primary GI: Dr. Gala Romney   Chief Complaint  Patient presents with  . Abdominal Pain    HPI:   [name] is a 73 y.o. female presenting today with a history of GERD, constipation, chronic abdominal pain. History of left lateral abdominal wall hernia s/p nephrectomy in remote past. Colonoscopy 2017 with diverticulosis in entire colon, mucosal ulceration at IC valve felt to be related to NSAID effect, otherwise normal. Due for surveillance in 2022. Due to continued pain, CT 2/18 completed without acute findings. She does have moderate to severe scoliosis, DDD, worse in lumbar regions, degenerative changes of bilateral hips. Recommended referral to Pain Management, which we have had difficulty completing as PCP needs to refer per Pain Management. I've also recommended spine specialist referral. Previously evaluated by Dr. Dalbert Batman July 2017 due to left flank pain and known left lateral abdominal incisional hernia. It was felt best not to operate at that time and have further evaluation with spine specialist. IF spine specialist felt that her disease was not culprit for pain, she could return to see Dr. Rosendo Gros for consideration of hernia repair, although this may not relieve pain.   She returns today for follow-up after staring Amitiza 8 mcg recently. She states BID dosing causes looser stool. Sometimes abdominal pain is relieved with BM just slightly. Majority of pain is precipitated by movement, turning, twisting. Feels constant discomfort located along nephrectomy incision. Uses a hot towel at times. Does not want to be referred to spine specialist currently. 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 Knows that she may not be surgical candidate and that this may not relieve pain.    Past Medical History:  Diagnosis Date  . Anxiety   . Arthritis   . Bronchiolitis    acute  . Chronic back pain   .  Depression   . Diverticulosis   . GERD (gastroesophageal reflux disease)    negative H pylori stool Ag  . High cholesterol   . HTN (hypertension)   . Hyperplastic colon polyp 11/08/08  . IBS (irritable bowel syndrome)   . Tubular adenoma of colon   . Umbilical hernia   . Wears glasses     Past Surgical History:  Procedure Laterality Date  . ABDOMINAL HYSTERECTOMY  1985  . APPENDECTOMY    . BIOPSY  05/09/2016   Procedure: BIOPSY;  Surgeon: Daneil Dolin, MD;  [facility];  Service: Endoscopy;;  bx ulcer at ileocecal valve  . BREAST SURGERY  1989   both sides  . CARPAL TUNNEL RELEASE Right 08/23/2013   Procedure: RIGHT ENDOSCOPIC CARPAL  TUNNEL RELEASE;  Surgeon: Jolyn Nap, MD;  Location: [place];  Service: Orthopedics;  Laterality: Right;  . CATARACT EXTRACTION, BILATERAL    . [place] [facility]  2007  . CHOLECYSTECTOMY  1980  . COLONOSCOPY  11/08/08   Dr. Gala Romney- hemorrhoids, pancolonic diverticula,,hyperplastic polyp  . COLONOSCOPY   08/05/2005   BJS:EGBTDV rectum/Diminutive polyp at 56 cm/Pancolonic diverticula/benign-appearing ulcer in the mid descending colon  . COLONOSCOPY N/A 03/30/2013   V[facility]:YWVPXTG anal canal hemorrhoids. Single colonic polyp-removed (tubular adenoma). Colonic diverticulosis.  . COLONOSCOPY N/A 05/09/2016   Dr. Gala Romney: diverticulosis in entire colon, mucosal ulceration at IC valve felt to be related to NSAID effect, otherwise normal. due for surveillance in 2022.   . DORSAL COMPARTMENT RELEASE Left 12/12/2014   Procedure: LEFT WRIST RELEASE  DORSAL COMPARTMENT (DEQUERVAIN);  Surgeon: Milly Jakob, MD;  Location: [place];  Service: Orthopedics;  Laterality: Left;  . ECTOPIC PREGNANCY SURGERY     bilat  . FOOT SURGERY Right 02/2017  . NEPHRECTOMY  1975   left-infection  . TRIGGER FINGER RELEASE Right 08/23/2013   Procedure: RIGHT INDEX FINGER TRIGGER RELEASE ;  Surgeon: Jolyn Nap, MD;  Location:  [place];  Service: Orthopedics;  Laterality: Right;    Current Outpatient Medications  Medication Sig Dispense Refill  . amLODipine-valsartan (EXFORGE) 5-160 MG per tablet Take 1 tablet by mouth daily.    [name] BIOTIN PO Take by mouth daily.    [name] conjugated estrogens (PREMARIN) vaginal cream Use use 0.5 gm in vagina at bedtime for 2 weeks then 2 x weekly (Patient taking differently: Use use 0.5 gm in vagina at bedtime for 2 weeks then 2 x weekly as needed) 42.5 g 3  . furosemide (LASIX) 20 MG tablet Take 20 mg by mouth daily.     [name] HYDROcodone-acetaminophen (NORCO/VICODIN) 5-325 MG tablet Take 1 tablet by mouth 2 (two) times daily.    . hyoscyamine (LEVSIN SL) 0.125 MG SL tablet Place 1 tablet (0.125 mg total) under the tongue every 4 (four) hours as needed. One under the tongue q 4 hrs as needed and at bedtime. 40 tablet 3  . lubiprostone (AMITIZA) 8 MCG capsule Take 8 mcg by mouth 2 (two) times daily with a meal.    . metoprolol tartrate (LOPRESSOR) 25 MG tablet Take 12.5 mg by mouth daily as needed (for anxiety).   5  . omeprazole (PRILOSEC) 20 MG capsule Take 20 mg by mouth daily as needed. For acid reflux    . primidone (MYSOLINE) 50 MG tablet Take 1 tablet (50 mg total) by mouth 2 (two) times daily. 180 tablet 0  . pyridOXINE (VITAMIN B-6) 100 MG tablet Take 100 mg by mouth daily.     No current facility-administered medications for this visit.     Allergies as of 09/14/2018 - Review Complete 09/14/2018  Allergen Reaction Noted  . Morphine and related Hypertension 11/11/2013    Family History  Problem Relation Age of Onset  . Cancer Brother 60       gastric  . Heart attack Mother   . Colon cancer Neg Hx   . Colon polyps Neg Hx     Social History   Socioeconomic History  . Marital status: Married    Spouse name: [name]  . Number of children: 1  . Years of education: some coll.  . Highest education level: Not on file  Occupational History  . 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 Financial resource strain: Not on file  . Food insecurity:    Worry: Not on file    Inability: Not on file  . Transportation needs:    Medical: Not on file    Non-medical: Not on file  Tobacco Use  . Smoking status: Former Smoker    Packs/day: 0.25    Years: 30.00    Pack years: 7.50    Types: Cigarettes    Last attempt to quit: 08/19/2000    Years since quitting: 18.0  . Smokeless tobacco: Never Used  . Tobacco comment: "smoked but not inhaled"  Substance and Sexual Activity  . Alcohol use: No    Alcohol/week: 0.0 standard drinks  . Drug use: No  . Sexual  activity: Not Currently    Birth control/protection: Surgical    Comment: hyst  Lifestyle  . Physical activity:    Days per week: Not on file    Minutes per session: Not on file  . Stress: Not on file  Relationships  . Social connections:    Talks on phone: Not on file    Gets together: Not on file    Attends religious service: Not on file    Active member of club or organization: Not on file    Attends meetings of clubs or organizations: Not on file    Relationship status: Not on file  Other Topics Concern  . Not on file  Social History Narrative   Consumes 2 cups of caffeine daily    Review of Systems: Gen: Denies fever, chills, anorexia. Denies fatigue, weakness, weight loss.  CV: Denies chest pain, palpitations, syncope, peripheral edema, and claudication. Resp: Denies dyspnea at rest, cough, wheezing, coughing up blood, and pleurisy. GI: see HPI Derm: Denies rash, itching, dry skin Psych: Denies depression, anxiety, memory loss, confusion. No homicidal or suicidal ideation.  Heme: Denies bruising, bleeding, and enlarged lymph nodes.  Physical Exam: BP 138/89   Pulse 68   Temp 97.7 F (36.5 C) (Oral)   Ht 5\' 7"  (1.702 m)   Wt 169 lb 12.8 oz (77 kg)   BMI 26.59 kg/m  General:   Alert and oriented. No distress noted. Pleasant  and cooperative.  Head:  Normocephalic and atraumatic. Eyes:  Conjuctiva clear without scleral icterus. Mouth:  Oral mucosa pink and moist.  Abdomen:  +BS, soft, TTP along left nephrectomy incision. Left-sided abdomen fuller than right. Non-distended. No rebound or guarding. Msk:  Symmetrical without gross deformities. Normal posture. Extremities:  Without edema. Neurologic:  Alert and  oriented x4 Psych:  Alert and cooperative. Normal mood and affect.

## 2018-09-15 NOTE — Progress Notes (Signed)
CC'D TO PCP °

## 2018-09-16 ENCOUNTER — Ambulatory Visit: Payer: Medicare Other | Admitting: Gastroenterology

## 2018-09-18 ENCOUNTER — Telehealth: Payer: Self-pay | Admitting: Internal Medicine

## 2018-09-18 NOTE — Telephone Encounter (Signed)
Noted routing to AB.

## 2018-09-18 NOTE — Telephone Encounter (Signed)
Patient called and said that she feeling better since her last visit

## 2018-09-18 NOTE — Telephone Encounter (Signed)
I am so glad to hear this.   

## 2018-09-25 ENCOUNTER — Other Ambulatory Visit: Payer: Self-pay | Admitting: Neurology

## 2018-10-09 NOTE — Progress Notes (Signed)
Virtual Visit via Video Note The purpose of this virtual visit is to provide medical care while limiting exposure to the novel coronavirus.    Consent was obtained for video visit:  Yes.   Answered questions that patient had about telehealth interaction:  Yes.   I discussed the limitations, risks, security and privacy concerns of performing an evaluation and management service by telemedicine. I also discussed with the patient that there may be a patient responsible charge related to this service. The patient expressed understanding and agreed to proceed.  Pt location: Home Physician Location: office Name of referring provider:  Tisovec, Fransico Him, MD I connected with [name] at patients initiation/request on 10/12/2018 at  1:00 PM EDT by video enabled telemedicine application and verified that I am speaking with the correct person using two identifiers. Pt MRN:  [id number] Pt DOB:  25-Sep-1945 Video Participants:  [name];     History of Present Illness:  Patient is seen today in follow-up for essential tremor.  I last saw her in June, 2019 for the same.  At that visit, I recommended that she increase her primidone to 50 mg twice per day.  She expressed at that visit that she knew that anxiety was driving her tremor and she was working on techniques to improve her anxiety level.  She called me back about a month later to state that tremor was worse, but she also had not increased her medication and was only taking 50 mg once per day.  She also noted that she was having more stress and more anxiety, and it was recommended that she talk to her primary care physician and think about psychiatry and counseling resources to help her anxiety.  We have not heard from her since. She reports that her tremor does well until stress.  She is undergoing counseling now.  She has gotten a dog, and that has actually helped her stress some.  She is staying up late into the night to do church related  activities, and that has been somewhat stressful.  She notes headaches.  Her primary care physician thought perhaps these were related to the pollen and she just took her first antihistamine today, so she is not sure if it will be helpful. I have reviewed other records made available to me.  She has been seeing [facility] for abdominal pain.   Observations/Objective:   There were no vitals filed for this visit.  (Asked, but she did not have it available) GEN:  The patient appears stated age and is in NAD.  Neurological examination:  Orientation: The patient is alert and oriented x3. Cranial nerves: There is good facial symmetry. There is no facial hypomimia.  The speech is fluent and clear. Soft palate rises symmetrically and there is no tongue deviation. Hearing is intact to conversational tone. Motor: Strength is at least antigravity x 4.   Shoulder shrug is equal and symmetric.  There is no pronator drift.  Movement examination: Tone: unable Abnormal movements: there is RUE>LUE intention tremor, overall quite mild (although the quality of the video was poor).  No rest tremor Coordination:  There is no decremation with RAM's, with any form of RAMS, including alternating supination and pronation of the forearm, hand opening and closing, finger taps, heel taps and toe taps. Gait and Station: The patient walks well in her hall today    Assessment and Plan:   1.  Essential tremor  -Continue primidone, 50 mg twice per  day.  I sent a prescription for that today.  -Discussed in detail Covid-19 and risk factors for this, including age.  Discussed importance of social distancing.  Discussed importance of staying home at all times, as is feasible.  Discussed taking advantage of grocery store hours for the elderly.  Pt expressed understanding.   2.  General anxiety disorder  -This certainly drives her tremor.  Encouraged counseling and she is doing this and I am proud of her for doing  this.  3.  Headache  -I do think some of this is related to stress/lack of sleep.  She is going to bed approximately 2 AM because of church related work that she is doing and is very stressed out about that.  She and I talked about having a regular sleep schedule.  We also talked about making sure that she is hydrating properly.  She is drinking almost no water during the daytime.  She did recently get a puppy, and she thinks that is actually helping with her stress.  She had no atypical features of headache (nothing focal or lateralizing).  She will let me know if headache does not resolve once she gets onto a regular schedule and start hydrating.  Follow Up Instructions:    -I discussed the assessment and treatment plan with the patient. The patient was provided an opportunity to ask questions and all were answered. The patient agreed with the plan and demonstrated an understanding of the instructions.   The patient was advised to call back or seek an in-person evaluation if the symptoms worsen or if the condition fails to improve as anticipated.    Total Time spent in visit with the patient was:  25 min, of which more than 50% of the time was spent in counseling and/or coordinating care on safety.   Pt understands and agrees with the plan of care outlined.     Alice Bogus, DO

## 2018-10-12 ENCOUNTER — Telehealth (INDEPENDENT_AMBULATORY_CARE_PROVIDER_SITE_OTHER): Payer: Medicare Other | Admitting: Neurology

## 2018-10-12 ENCOUNTER — Other Ambulatory Visit: Payer: Self-pay

## 2018-10-12 ENCOUNTER — Encounter: Payer: Self-pay | Admitting: Neurology

## 2018-10-12 DIAGNOSIS — G250 Essential tremor: Secondary | ICD-10-CM

## 2018-10-12 DIAGNOSIS — G44209 Tension-type headache, unspecified, not intractable: Secondary | ICD-10-CM | POA: Diagnosis not present

## 2018-10-12 DIAGNOSIS — F411 Generalized anxiety disorder: Secondary | ICD-10-CM

## 2018-10-12 MED ORDER — PRIMIDONE 50 MG PO TABS: 50.0000 mg | ORAL_TABLET | Freq: Two times a day (BID) | ORAL | 1 refills | Status: DC

## 2018-10-22 ENCOUNTER — Ambulatory Visit: Payer: Medicare Other | Admitting: Neurology

## 2018-11-04 ENCOUNTER — Telehealth: Payer: Self-pay

## 2018-11-04 NOTE — Telephone Encounter (Signed)
Opened in error

## 2018-11-27 ENCOUNTER — Telehealth: Payer: Self-pay | Admitting: Adult Health

## 2018-11-27 NOTE — Telephone Encounter (Signed)
Called patient and let her know that Anderson Malta is out of office till Tuesday. Patient would like to speak to [facility].

## 2018-11-27 NOTE — Telephone Encounter (Signed)
Patient called stating that she would like a call back from [place], pt states that jennifer has given a cream to use and she has been using it on and off and she has run out but now she is having pain in her private area and would like to know if she needs to schedule and appointment.please contact pt

## 2018-12-01 ENCOUNTER — Telehealth: Payer: Self-pay | Admitting: Adult Health

## 2018-12-01 NOTE — Telephone Encounter (Signed)
Patient called back this morning from her call on Friday.  She would like to speak to [place].

## 2018-12-01 NOTE — Telephone Encounter (Signed)
Left message I returned her call  

## 2018-12-02 NOTE — Telephone Encounter (Signed)
husband died 4/24 and now having pain in vagina and feels full, having to get up at night to void... to  Come in Friday at 11 am

## 2018-12-02 NOTE — Telephone Encounter (Signed)
Pt returning call to [facility]. States she is unsure if she should come in for an appt or not.

## 2018-12-03 ENCOUNTER — Telehealth: Payer: Self-pay | Admitting: *Deleted

## 2018-12-03 NOTE — Telephone Encounter (Signed)
Called patient and left message informing her that we are not allowing any visitors or children to come to visit with her at this time. Also requested that if she has had any exposure to anyone suspected or confirmed of having COVID-19 to call to reschedule appointment. Also requested that if she is experiencing any of the following to call and reschedule : fever, cough, sob, muscle pain, diarrhea, rash, vomiting, abdominal pain, red eye, weakness, bruising or bleeding, joint pain, or a severe headache. Also advised that if she has a mask to wear it to her appointment.  

## 2018-12-04 ENCOUNTER — Other Ambulatory Visit: Payer: Self-pay

## 2018-12-04 ENCOUNTER — Encounter: Payer: Self-pay | Admitting: Adult Health

## 2018-12-04 ENCOUNTER — Ambulatory Visit: Payer: Medicare Other | Admitting: Adult Health

## 2018-12-04 VITALS — BP 131/80 | HR 70 | Temp 98.6°F | Ht 67.0 in | Wt 168.0 lb

## 2018-12-04 DIAGNOSIS — B9689 Other specified bacterial agents as the cause of diseases classified elsewhere: Secondary | ICD-10-CM | POA: Insufficient documentation

## 2018-12-04 DIAGNOSIS — N898 Other specified noninflammatory disorders of vagina: Secondary | ICD-10-CM

## 2018-12-04 DIAGNOSIS — N760 Acute vaginitis: Secondary | ICD-10-CM | POA: Diagnosis not present

## 2018-12-04 DIAGNOSIS — N952 Postmenopausal atrophic vaginitis: Secondary | ICD-10-CM

## 2018-12-04 LAB — POCT WET PREP (WET MOUNT)
Clue Cells Wet Prep Whiff POC: POSITIVE
WBC, Wet Prep HPF POC: POSITIVE

## 2018-12-04 MED ORDER — METRONIDAZOLE 500 MG PO TABS: 500.0000 mg | ORAL_TABLET | Freq: Two times a day (BID) | ORAL | 0 refills | Status: DC

## 2018-12-04 NOTE — Progress Notes (Signed)
Patient ID: [name], female   DOB: 29-May-1946, 73 y.o.   MRN: [id number] History of Present Illness: [name] is a 73 year old black female, widowed recently, sp hysterectomy, in complaining of vaginal discharge and swelling.Had been using PVC and then stopped, just used again. She has been in pants more recently. PCP is Dr Osborne Casco.    Current Medications, Allergies, Past Medical History, Past Surgical History, Family History and Social History were reviewed in [employer] record.     Review of Systems: +Vagina is swollen +Vaginal discharge    Physical Exam:BP 131/80 (BP Location: Left Arm, Patient Position: Sitting, Cuff Size: Normal)   Pulse 70   Temp 98.6 F (37 C)   Ht 5\' 7"  (1.702 m)   Wt 168 lb (76.2 kg)   BMI 26.31 kg/m  General:  Well developed, well nourished, no acute distress Skin:  Warm and dry Pelvic:  External genitalia is normal in appearance, no lesions.  The vagina is red, has white creamy discharge with odor. Urethra has no lesions or masses. The cervix and uterus are absent.  No adnexal masses or tenderness noted.Bladder is non tender, no masses felt. Wet prep:+WBCs and clue cells Psych:  No mood changes, alert and cooperative,seems happy Fall risk is low. Examination chaperoned by Levy Pupa LPN.  Impression and Plan: 1. Vaginal discharge -wet prep +clues cells -wear loose pants like boxers at home  2. BV (bacterial vaginosis) -will rx flagyl Meds ordered this encounter  Medications  . metroNIDAZOLE (FLAGYL) 500 MG tablet    Sig: Take 1 tablet (500 mg total) by mouth 2 (two) times daily.    Dispense:  14 tablet    Refill:  0    Order Specific Question:   Supervising Provider    Answer:   Elonda Husky, LUTHER H [[id number]]    3. Vaginal atrophy -resume PVC next week, 2 x weekly Follow up prn

## 2018-12-14 ENCOUNTER — Ambulatory Visit: Payer: Medicare Other | Admitting: Gastroenterology

## 2018-12-24 ENCOUNTER — Other Ambulatory Visit: Payer: Self-pay | Admitting: Adult Health

## 2018-12-28 ENCOUNTER — Ambulatory Visit: Payer: Medicare Other | Admitting: Gastroenterology

## 2019-02-23 ENCOUNTER — Other Ambulatory Visit: Payer: Self-pay

## 2019-02-23 ENCOUNTER — Ambulatory Visit (INDEPENDENT_AMBULATORY_CARE_PROVIDER_SITE_OTHER): Payer: Medicare Other | Admitting: Neurology

## 2019-02-23 ENCOUNTER — Encounter: Payer: Self-pay | Admitting: Neurology

## 2019-02-23 ENCOUNTER — Encounter (INDEPENDENT_AMBULATORY_CARE_PROVIDER_SITE_OTHER): Payer: Medicare Other | Admitting: Neurology

## 2019-02-23 DIAGNOSIS — R202 Paresthesia of skin: Secondary | ICD-10-CM

## 2019-02-23 DIAGNOSIS — R200 Anesthesia of skin: Secondary | ICD-10-CM | POA: Diagnosis not present

## 2019-02-23 DIAGNOSIS — Z0289 Encounter for other administrative examinations: Secondary | ICD-10-CM

## 2019-02-23 NOTE — Progress Notes (Signed)
Full Name: Morgyn Marut Gender: Female MRN #: [id number] Date of Birth: Feb 14, 1946    Visit Date: 02/23/2019 09:04 Age: 73 Years 2 Months Old Examining Physician: Arlice Colt, MD  Referring Physician: B. Marcheta Grammes, MD    History:  Nakiyah Beverley is a 73 year old woman with pain and numbness in her feet.  At times, the symptoms seem more in the arch near the toes and at other times more on the dorsum of the foot at the toes.  She does not note any weakness in the toes.  Symptoms are worse on the right than the left.  On examination she has reduced sensation to touch in the feet relative to the ankles but without a specific pattern.  Strength was fine.  Nerve conduction studies: The bilateral peroneal and tibial motor responses had normal distal latencies, amplitudes and conduction velocities.  F-wave responses were minimally prolonged.  Bilateral sural and superficial peroneal sensory responses were normal.  Medial and lateral plantar sensory responses were detected in the more symptomatic right foot.  The left medial plantar sensory response was detected though the left lateral plantar sensory response was not detected.  Galvanic skin responses testing small fibers were detected in both feet  Electromyography: Needle EMG of selected muscles of the right leg was performed.  Mild chronic denervation was noted in the extensor brevis digitorum muscle in the foot and the peroneus longus muscle.  Other muscles tested were essentially normal.  Impression: This NCV/EMG study shows the following: 1.   There is no evidence of a large fiber or small fiber polyneuropathy.  Plantar responses are difficult to detect after age 94 in the absence of a response in the left symptomatic foot is unlikely to be clinically significant. 2.   Probable minimal right L5 or S1 chronic radiculopathy without active features.  Maleeah Crossman A. Felecia Shelling, MD, PhD, FAAN Certified in Neurology, Clinical  Neurophysiology, Sleep Medicine, Pain Medicine and Neuroimaging Director, [place] [facility] [address], [place], [place] [address] [phone]         [facility]    Nerve / Sites Muscle Latency Ref. Amplitude Ref. Rel Amp Segments Distance Velocity Ref. Area    ms ms mV mV %  cm m/s m/s mVms  R Peroneal - EDB     Ankle EDB 4.2 ?6.5 5.9 ?2.0 100 Ankle - EDB 9   14.8     Fib head EDB 11.0  4.3  72.8 Fib head - Ankle 31 45 ?44 14.0     Pop fossa EDB 13.3  4.3  99.5 Pop fossa - Fib head 10 44 ?44 15.3         Pop fossa - Ankle      L Peroneal - EDB     Ankle EDB 4.3 ?6.5 10.9 ?2.0 100 Ankle - EDB 9   34.0     Fib head EDB 11.4  9.1  83.9 Fib head - Ankle 31 44 ?44 31.3     Pop fossa EDB 13.7  8.2  89.8 Pop fossa - Fib head 10 44 ?44 28.5         Pop fossa - Ankle      R Tibial - AH     Ankle AH 4.5 ?5.8 8.1 ?4.0 100 Ankle - AH 9   15.4     Pop fossa AH 14.0  5.2  63.2 Pop fossa - Ankle 39  41 ?41 12.8  L Tibial - AH     Ankle AH 4.8 ?5.8 11.0 ?4.0 100 Ankle - AH 9   24.0     Pop fossa AH 14.4  8.2  74.4 Pop fossa - Ankle 39 41 ?41 22.1             SSR        SNC    Nerve / Sites Rec. Site Peak Lat Ref.  Amp Ref. Segments Distance Peak Diff Ref.    ms ms V V  cm ms ms  R Sural - Ankle (Calf)     Calf Ankle 3.4 ?4.4 8 ?6 Calf - Ankle 14    L Sural - Ankle (Calf)     Calf Ankle 3.8 ?4.4 7 ?6 Calf - Ankle 14    R Superficial peroneal - Ankle     Lat leg Ankle 4.1 ?4.4 6 ?6 Lat leg - Ankle 14    L Superficial peroneal - Ankle     Lat leg Ankle 4.2 ?4.4 6 ?6 Lat leg - Ankle 14    L Medial plantar, Lateral plantar - Ankle (Medial, lateral sole)     Medial plantar Sole Ankle 3.7 ?3.7 3 ?3 Medial plantar Sole - Ankle 14       Lateral plantar Sole Ankle NR ?3.7 NR ?3 Lateral plantar Sole - Ankle 14          Medial plantar Sole - Lateral plantar Sole  NR ?0.0  R Medial plantar, Lateral plantar - Ankle  (Medial, lateral sole)     Medial plantar Sole Ankle 3.9 ?3.7 2 ?3 Medial plantar Sole - Ankle 14       Lateral plantar Sole Ankle 3.9 ?3.7 2 ?3 Lateral plantar Sole - Ankle 14          Medial plantar Sole - Lateral plantar Sole  -0.1 ?0.0                 F  Wave    Nerve F Lat Ref.   ms ms  R Tibial - AH 57.8 ?56.0  L Tibial - AH 57.0 ?56.0               EMG full       EMG Summary Table    Spontaneous MUAP Recruitment  Muscle IA Fib PSW Fasc Other Amp Dur. Poly Pattern  R. Vastus medialis Normal None None None _______ Normal Normal Normal Normal  R. Tibialis anterior Normal None None None _______ Normal Normal Normal Normal  R. Peroneus longus Normal None None None _______ Increased Normal 1+ Reduced  R. Gastrocnemius (Medial head) Normal None None None _______ Normal Normal 1+ Normal  R. Extensor digitorum brevis Normal None None None _______ Normal Increased 1+ Reduced  R. Abductor hallucis Normal None None None _______ Normal Normal 1+ Normal  R. Dorsal interossei (pedis) Normal None None None _______ Normal Normal Normal Normal  R. Gluteus medius Normal None None None _______ Normal Normal Normal Normal

## 2019-03-16 ENCOUNTER — Other Ambulatory Visit (HOSPITAL_COMMUNITY): Payer: Self-pay | Admitting: Internal Medicine

## 2019-03-16 DIAGNOSIS — Z1231 Encounter for screening mammogram for malignant neoplasm of breast: Secondary | ICD-10-CM

## 2019-03-18 ENCOUNTER — Other Ambulatory Visit: Payer: Self-pay | Admitting: Neurology

## 2019-03-18 NOTE — Telephone Encounter (Signed)
Requested Prescriptions   Pending Prescriptions Disp Refills  . primidone (MYSOLINE) 50 MG tablet [Pharmacy Med Name: PRIMIDONE 50 MG TABLET] 180 tablet 1    Sig: TAKE 1 TABLET BY MOUTH TWICE A DAY   Rx last filled: 10/12/18 #180 1 refills  Pt last seen: 10/12/18   Follow up appt scheduled: 05/14/19

## 2019-04-01 ENCOUNTER — Other Ambulatory Visit: Payer: Self-pay | Admitting: Orthopedic Surgery

## 2019-04-01 ENCOUNTER — Other Ambulatory Visit (HOSPITAL_COMMUNITY): Payer: Self-pay | Admitting: Orthopedic Surgery

## 2019-04-01 DIAGNOSIS — M545 Low back pain, unspecified: Secondary | ICD-10-CM

## 2019-04-07 ENCOUNTER — Other Ambulatory Visit: Payer: Self-pay

## 2019-04-07 ENCOUNTER — Ambulatory Visit (HOSPITAL_COMMUNITY)
Admission: RE | Admit: 2019-04-07 | Discharge: 2019-04-07 | Disposition: A | Discharge status: Home / Self Care | Payer: Medicare Other | Source: Ambulatory Visit | Attending: Orthopedic Surgery | Admitting: Orthopedic Surgery

## 2019-04-07 DIAGNOSIS — M545 Low back pain, unspecified: Secondary | ICD-10-CM

## 2019-04-23 ENCOUNTER — Ambulatory Visit (HOSPITAL_COMMUNITY)
Admission: RE | Admit: 2019-04-23 | Discharge: 2019-04-23 | Disposition: A | Discharge status: Home / Self Care | Payer: Medicare Other | Source: Ambulatory Visit | Attending: Internal Medicine | Admitting: Internal Medicine

## 2019-04-23 ENCOUNTER — Other Ambulatory Visit: Payer: Self-pay

## 2019-04-23 DIAGNOSIS — Z1231 Encounter for screening mammogram for malignant neoplasm of breast: Secondary | ICD-10-CM | POA: Diagnosis present

## 2019-05-13 NOTE — Progress Notes (Signed)
[name] was seen today in follow up for Essential tremor.  Pt is currently on primidone, 50 mg twice per day.  Pt denies falls.  Pt denies lightheadedness, near syncope.  No hallucinations.  Mood has been good.  Medical records have been reviewed since our last visit.  She was referred to [facility] neurology in mid August for a nerve conduction study/EMG for paresthesias of the feet.  It was essentially unremarkable. States that she is dealing with sciatica on the right and offered but declined injection.  Is working with Restaurant manager, fast food.  Husband died in 2022/11/08.  Adjusting to that has been difficult   PREVIOUS MEDICATIONS: Primidone  ALLERGIES:   Allergies  Allergen Reactions  . Morphine And Related Hypertension    CURRENT MEDICATIONS:  Outpatient Encounter Medications as of 05/14/2019  Medication Sig  . amLODipine-valsartan (EXFORGE) 5-160 MG per tablet Take 1 tablet by mouth daily.  [name] b complex vitamins tablet Take 1 tablet by mouth daily.  [name] BIOTIN PO Take by mouth daily.  . budesonide-formoterol (SYMBICORT) 80-4.5 MCG/ACT inhaler Inhale 2 puffs into the lungs as needed.   . clonazePAM (KLONOPIN) 0.5 MG tablet 0.25 mg as needed.   . conjugated estrogens (PREMARIN) vaginal cream INSERT HALF A GRAM IN VAGINA AT BEDTIME FOR 2 WEEKS THEN 2 X WEEKLY  . fexofenadine (ALLEGRA) 60 MG tablet Take 60 mg by mouth daily. Pt has not started yet  . furosemide (LASIX) 20 MG tablet Take 20 mg by mouth daily.   [name] gabapentin (NEURONTIN) 400 MG capsule Take 400 mg by mouth 3 (three) times daily.  [name] HYDROcodone-acetaminophen (NORCO/VICODIN) 5-325 MG tablet Take 1 tablet by mouth 2 (two) times daily.  . hyoscyamine (LEVSIN SL) 0.125 MG SL tablet Place 1 tablet (0.125 mg total) under the tongue every 4 (four) hours as needed. One under the tongue q 4 hrs as needed and at bedtime.  [name] lubiprostone (AMITIZA) 8 MCG capsule Take 8 mcg by mouth 2 (two) times daily with a meal.  . metoprolol tartrate  (LOPRESSOR) 25 MG tablet Take 12.5 mg by mouth daily as needed (for anxiety).   . metroNIDAZOLE (FLAGYL) 500 MG tablet Take 1 tablet (500 mg total) by mouth 2 (two) times daily.  [name] omeprazole (PRILOSEC) 20 MG capsule Take 20 mg by mouth daily as needed. For acid reflux  . primidone (MYSOLINE) 50 MG tablet TAKE 1 TABLET BY MOUTH TWICE A DAY   No facility-administered encounter medications on file as of 05/14/2019.     PAST MEDICAL HISTORY:   Past Medical History:  Diagnosis Date  . Anxiety   . Arthritis   . Bronchiolitis    acute  . Chronic back pain   . Depression   . Diverticulosis   . GERD (gastroesophageal reflux disease)    negative H pylori stool Ag  . High cholesterol   . HTN (hypertension)   . Hyperplastic colon polyp 11/08/08  . IBS (irritable bowel syndrome)   . Tubular adenoma of colon   . Umbilical hernia   . Wears glasses     PAST SURGICAL HISTORY:   Past Surgical History:  Procedure Laterality Date  . ABDOMINAL HYSTERECTOMY  1985  . APPENDECTOMY    . BIOPSY  05/09/2016   Procedure: BIOPSY;  Surgeon: Daneil Dolin, MD;  [facility];  Service: Endoscopy;;  bx ulcer at ileocecal valve  . BREAST SURGERY  1989   both sides  . CARPAL TUNNEL RELEASE Right 08/23/2013  Procedure: RIGHT ENDOSCOPIC CARPAL  TUNNEL RELEASE;  Surgeon: Jolyn Nap, MD;  Location: [place];  Service: Orthopedics;  Laterality: Right;  . CATARACT EXTRACTION, BILATERAL    . [place] SURGERY  2007  . CHOLECYSTECTOMY  1980  . COLONOSCOPY  11/08/08   Dr. Gala Romney- hemorrhoids, pancolonic diverticula,,hyperplastic polyp  . COLONOSCOPY   08/05/2005   [id number] rectum/Diminutive polyp at 33 cm/Pancolonic diverticula/benign-appearing ulcer in the mid descending colon  . COLONOSCOPY N/A 03/30/2013   [id number] anal canal hemorrhoids. Single colonic polyp-removed (tubular adenoma). Colonic diverticulosis.  . COLONOSCOPY N/A 05/09/2016   Dr. Gala Romney: diverticulosis in  entire colon, mucosal ulceration at IC valve felt to be related to NSAID effect, otherwise normal. due for surveillance in 2022.   . DORSAL COMPARTMENT RELEASE Left 12/12/2014   Procedure: LEFT WRIST RELEASE DORSAL COMPARTMENT (DEQUERVAIN);  Surgeon: Milly Jakob, MD;  Location: [place];  Service: Orthopedics;  Laterality: Left;  . ECTOPIC PREGNANCY SURGERY     bilat  . FOOT SURGERY Right 02/2017  . NEPHRECTOMY  1975   left-infection  . TRIGGER FINGER RELEASE Right 08/23/2013   Procedure: RIGHT INDEX FINGER TRIGGER RELEASE ;  Surgeon: Jolyn Nap, MD;  Location: [place];  Service: Orthopedics;  Laterality: Right;    SOCIAL HISTORY:   Social History   Socioeconomic History  . Marital status: Widowed    Spouse name: [name]  . Number of children: 1  . Years of education: some coll.  . Highest education level: Not on file  Occupational History  . 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 Financial resource strain: Not on file  . Food insecurity    Worry: Not on file    Inability: Not on file  . Transportation needs    Medical: Not on file    Non-medical: Not on file  Tobacco Use  . Smoking status: Former Smoker    Packs/day: 0.25    Years: 30.00    Pack years: 7.50    Types: Cigarettes    Quit date: 08/19/2000    Years since quitting: 18.7  . Smokeless tobacco: Never Used  . Tobacco comment: "smoked but not inhaled"  Substance and Sexual Activity  . Alcohol use: No    Alcohol/week: 0.0 standard drinks  . Drug use: No  . Sexual activity: Not Currently    Birth control/protection: Surgical    Comment: hyst  Lifestyle  . Physical activity    Days per week: Not on file    Minutes per session: Not on file  . Stress: Not on file  Relationships  . Social Herbalist on phone: Not on file    Gets together: Not on file    Attends religious service: Not on file    Active member  of club or organization: Not on file    Attends meetings of clubs or organizations: Not on file    Relationship status: Not on file  . Intimate partner violence    Fear of current or ex partner: Not on file    Emotionally abused: Not on file    Physically abused: Not on file    Forced sexual activity: Not on file  Other Topics Concern  . Not on file  Social History Narrative   Consumes 2 cups of caffeine daily    FAMILY HISTORY:   Family Status  Relation Name Status  .  Brother  Deceased       stomach CA  . Mother  Deceased at age 66       heart attack  . Father  Deceased       unknown,as a child  . Sister x2 Alive  . Son  Alive  . Neg Hx  (Not Specified)    ROS:  Review of Systems  Constitutional: Negative.   HENT: Negative.   Eyes: Negative.   Respiratory: Negative.   Cardiovascular: Negative.   Gastrointestinal: Negative.   Genitourinary: Negative.   Musculoskeletal: Positive for back pain and joint pain (Knee).  Skin: Negative.   Psychiatric/Behavioral: Positive for depression (reactive with death of husband but no SI ).    PHYSICAL EXAMINATION:    VITALS:   Vitals:   05/14/19 1101  BP: (!) 153/83  Pulse: 80  Resp: 18  Temp: 98.4 F (36.9 C)  SpO2: 98%  Weight: 169 lb (76.7 kg)  Height: 5\' 7"  (1.702 m)    GEN:  The patient appears stated age and is in NAD. HEENT:  Normocephalic, atraumatic.  The mucous membranes are moist. The superficial temporal arteries are without ropiness or tenderness. CV:  RRR Lungs:  CTAB Neck/HEME:  There are no carotid bruits bilaterally.  Neurological examination:  Orientation: The patient is alert and oriented x3. Cranial nerves: There is good facial symmetry without facial hypomimia. The speech is fluent and clear. Soft palate rises symmetrically and there is no tongue deviation. Hearing is intact to conversational tone. Sensation: Sensation is intact to light touch throughout Motor: Strength is at least antigravity  x4.  Movement examination: Tone: There is normal tone in the UE/LE Abnormal movements: mild R postural tremor Coordination:  There is no decremation with RAM's. Gait and Station: The patient has no difficulty arising out of a deep-seated chair without the use of the hands. The patient's stride length is good.     ASSESSMENT/PLAN:  1.  Essential tremor             -Continue primidone, 50 mg twice per day.  RX refilled in sept.  Will let me know when runs out             -Discussed in detail Covid-19 and risk factors for this, including age.  Discussed importance of social distancing.  Discussed importance of staying home at all times, as is feasible.  Discussed taking advantage of grocery store hours for the elderly.  Pt expressed understanding.   2.  General anxiety disorder and now reactive depression with death of husband  -spent most of the visit listening to the patient and reminiscing about husband and his death and circumstances surrounding the death.  Support provided.  She does have family support in the area.  Son lives in [place], but she does speak with him.  3.  Sciatica  -Has opted against injections.  Is seeking chiropractic care for now.  4.  Knee pain  -Asks me if it is related to the sciatica.  I told her it would not be.  She states that she is going to get in contact with orthopedics.  5.  Follow-up 8 months, sooner should new neurologic issues arise.  Much greater than 50% of this visit was spent in counseling and coordinating care.  Total face to face time:  30 min  Cc:  Tisovec, Fransico Him, MD

## 2019-05-14 ENCOUNTER — Encounter: Payer: Self-pay | Admitting: Neurology

## 2019-05-14 ENCOUNTER — Ambulatory Visit (INDEPENDENT_AMBULATORY_CARE_PROVIDER_SITE_OTHER): Payer: Medicare Other | Admitting: Neurology

## 2019-05-14 ENCOUNTER — Other Ambulatory Visit: Payer: Self-pay

## 2019-05-14 VITALS — BP 153/83 | HR 80 | Temp 98.4°F | Resp 18 | Ht 67.0 in | Wt 169.0 lb

## 2019-05-14 DIAGNOSIS — M25561 Pain in right knee: Secondary | ICD-10-CM | POA: Diagnosis not present

## 2019-05-14 DIAGNOSIS — G250 Essential tremor: Secondary | ICD-10-CM | POA: Diagnosis not present

## 2019-05-14 DIAGNOSIS — F329 Major depressive disorder, single episode, unspecified: Secondary | ICD-10-CM

## 2019-05-14 DIAGNOSIS — M5431 Sciatica, right side: Secondary | ICD-10-CM

## 2019-05-14 NOTE — Patient Instructions (Signed)
The physicians and staff at [facility] are committed to providing excellent care. You may receive a survey requesting feedback about your experience at our office. We strive to receive "very good" responses to the survey questions. If you feel that your experience would prevent you from giving the office a "very good " response, please contact our office to try to remedy the situation. We may be reached at [phone]. Thank you for taking the time out of your busy day to complete the survey.  

## 2019-05-19 ENCOUNTER — Encounter: Payer: Self-pay | Admitting: Adult Health

## 2019-05-19 ENCOUNTER — Ambulatory Visit (INDEPENDENT_AMBULATORY_CARE_PROVIDER_SITE_OTHER): Payer: Medicare Other | Admitting: Adult Health

## 2019-05-19 ENCOUNTER — Other Ambulatory Visit: Payer: Self-pay

## 2019-05-19 VITALS — BP 132/86 | HR 83 | Ht 67.0 in | Wt 169.0 lb

## 2019-05-19 DIAGNOSIS — R319 Hematuria, unspecified: Secondary | ICD-10-CM

## 2019-05-19 DIAGNOSIS — R350 Frequency of micturition: Secondary | ICD-10-CM | POA: Insufficient documentation

## 2019-05-19 DIAGNOSIS — N390 Urinary tract infection, site not specified: Secondary | ICD-10-CM

## 2019-05-19 LAB — POCT URINALYSIS DIPSTICK OB
Glucose, UA: NEGATIVE
Ketones, UA: NEGATIVE

## 2019-05-19 MED ORDER — SULFAMETHOXAZOLE-TRIMETHOPRIM 800-160 MG PO TABS: 1.0000 | ORAL_TABLET | Freq: Two times a day (BID) | ORAL | 0 refills | Status: DC

## 2019-05-19 NOTE — Progress Notes (Signed)
  Subjective:     Patient ID: [name], female   DOB: [birth year], 73 y.o.   MRN: [id number]  HPI Denea is a 73 year old black female, sp hysterectomy in complaining of urainry frequency and pressure and has wiped blood. PCP is Dr. Osborne Casco  Review of Systems Urinary frequency +pressure  +burning at times  Reviewed past medical,surgical, social and family history. Reviewed medications and allergies.     Objective:   Physical Exam BP 132/86 (BP Location: Left Arm, Patient Position: Sitting, Cuff Size: Normal)   Pulse 83   Ht 5\' 7"  (1.702 m)   Wt 169 lb (76.7 kg)   BMI 26.47 kg/m   Urine dipstick +nitrates,large blood,small leuks, +protein.   Skin warm and dry. Lungs: clear to ausculation bilaterally. Cardiovascular: regular rate and rhythm. No CVAT, Skin warm and dry.Pelvic: external genitalia is normal in appearance no lesions, vagina: scant discharge without odor,urethra has no lesions or masses noted, cervix and uterus are absent,adnexa: no masses or tenderness noted. Bladder is non tender and no masses felt. Co exam with Weyman Croon FNP student.  Assessment:     1. Urinary tract infection with hematuria, site unspecified   2. Urinary frequency   3. Hematuria, unspecified type       Plan:    UA C&S sent Review handout on UTI Increase water Can try AZO if needed Will rx septra ds  Meds ordered this encounter  Medications  . sulfamethoxazole-trimethoprim (BACTRIM DS) 800-160 MG tablet    Sig: Take 1 tablet by mouth 2 (two) times daily. Take 1 bid    Dispense:  14 tablet    Refill:  0    Order Specific Question:   Supervising Provider    Answer:   Tania Ade H [[id number]]  Follow up prn

## 2019-05-19 NOTE — Patient Instructions (Signed)

## 2019-05-20 LAB — URINALYSIS, ROUTINE W REFLEX MICROSCOPIC
Bilirubin, UA: NEGATIVE
Glucose, UA: NEGATIVE
Nitrite, UA: POSITIVE — AB
Specific Gravity, UA: 1.024 (ref 1.005–1.030)
Urobilinogen, Ur: 0.2 mg/dL (ref 0.2–1.0)
pH, UA: 5.5 (ref 5.0–7.5)

## 2019-05-20 LAB — MICROSCOPIC EXAMINATION
Casts: NONE SEEN /lpf
Epithelial Cells (non renal): >10 /hpf — AB (ref 0–10)
RBC: >30 /hpf — AB (ref 0–2)

## 2019-05-21 ENCOUNTER — Telehealth: Payer: Self-pay | Admitting: Adult Health

## 2019-05-21 LAB — URINE CULTURE

## 2019-05-21 MED ORDER — NITROFURANTOIN MONOHYD MACRO 100 MG PO CAPS: 100.0000 mg | ORAL_CAPSULE | Freq: Two times a day (BID) | ORAL | 0 refills | Status: DC

## 2019-05-21 NOTE — Telephone Encounter (Signed)
Pt aware that urine +Ecoli , will rx Macrobid

## 2019-07-07 ENCOUNTER — Ambulatory Visit: Payer: Medicare Other | Attending: Internal Medicine

## 2019-07-07 ENCOUNTER — Other Ambulatory Visit: Payer: Self-pay

## 2019-07-07 DIAGNOSIS — Z20822 Contact with and (suspected) exposure to covid-19: Secondary | ICD-10-CM

## 2019-07-08 LAB — NOVEL CORONAVIRUS, NAA: SARS-CoV-2, NAA: NOT DETECTED

## 2019-08-24 ENCOUNTER — Emergency Department (HOSPITAL_COMMUNITY): Payer: Medicare PPO

## 2019-08-24 ENCOUNTER — Encounter (HOSPITAL_COMMUNITY): Payer: Self-pay | Admitting: Emergency Medicine

## 2019-08-24 ENCOUNTER — Other Ambulatory Visit: Payer: Self-pay

## 2019-08-24 ENCOUNTER — Emergency Department (HOSPITAL_COMMUNITY)
Admission: EM | Admit: 2019-08-24 | Discharge: 2019-08-25 | Disposition: A | Discharge status: Home / Self Care | Payer: Medicare PPO | Attending: Emergency Medicine | Admitting: Emergency Medicine

## 2019-08-24 DIAGNOSIS — Z87891 Personal history of nicotine dependence: Secondary | ICD-10-CM | POA: Diagnosis not present

## 2019-08-24 DIAGNOSIS — Z79899 Other long term (current) drug therapy: Secondary | ICD-10-CM | POA: Insufficient documentation

## 2019-08-24 DIAGNOSIS — R519 Headache, unspecified: Secondary | ICD-10-CM | POA: Diagnosis not present

## 2019-08-24 DIAGNOSIS — I10 Essential (primary) hypertension: Secondary | ICD-10-CM | POA: Diagnosis present

## 2019-08-24 LAB — CBC WITH DIFFERENTIAL/PLATELET
Abs Immature Granulocytes: 0.01 10*3/uL (ref 0.00–0.07)
Basophils Absolute: 0 10*3/uL (ref 0.0–0.1)
Basophils Relative: 0 %
Eosinophils Absolute: 0.1 10*3/uL (ref 0.0–0.5)
Eosinophils Relative: 1 %
HCT: 40.7 % (ref 36.0–46.0)
Hemoglobin: 12.5 g/dL (ref 12.0–15.0)
Immature Granulocytes: 0 %
Lymphocytes Relative: 40 %
Lymphs Abs: 2.4 10*3/uL (ref 0.7–4.0)
MCH: 28.6 pg (ref 26.0–34.0)
MCHC: 30.7 g/dL (ref 30.0–36.0)
MCV: 93.1 fL (ref 80.0–100.0)
Monocytes Absolute: 0.4 10*3/uL (ref 0.1–1.0)
Monocytes Relative: 7 %
Neutro Abs: 3.2 10*3/uL (ref 1.7–7.7)
Neutrophils Relative %: 52 %
Platelets: 216 10*3/uL (ref 150–400)
RBC: 4.37 MIL/uL (ref 3.87–5.11)
RDW: 12.8 % (ref 11.5–15.5)
WBC: 6.1 10*3/uL (ref 4.0–10.5)
nRBC: 0 % (ref 0.0–0.2)

## 2019-08-24 LAB — BASIC METABOLIC PANEL
Anion gap: 10 (ref 5–15)
BUN: 14 mg/dL (ref 8–23)
CO2: 24 mmol/L (ref 22–32)
Calcium: 9.5 mg/dL (ref 8.9–10.3)
Chloride: 105 mmol/L (ref 98–111)
Creatinine, Ser: 0.95 mg/dL (ref 0.44–1.00)
GFR calc Af Amer: >60 mL/min (ref >60)
GFR calc non Af Amer: 59 mL/min — ABNORMAL LOW (ref >60)
Glucose, Bld: 91 mg/dL (ref 70–99)
Potassium: 3.7 mmol/L (ref 3.5–5.1)
Sodium: 139 mmol/L (ref 135–145)

## 2019-08-24 MED ORDER — CLONIDINE HCL 0.1 MG PO TABS
0.1000 mg | ORAL_TABLET | Freq: Once | ORAL | Status: AC
  Administered 2019-08-25: 0.1 mg via ORAL
  Filled 2019-08-24: qty 1

## 2019-08-24 MED ORDER — ACETAMINOPHEN 325 MG PO TABS
650.0000 mg | ORAL_TABLET | Freq: Once | ORAL | Status: AC
  Administered 2019-08-25: 650 mg via ORAL
  Filled 2019-08-24: qty 2

## 2019-08-24 NOTE — ED Provider Notes (Signed)
[facility] EMERGENCY DEPARTMENT Provider Note   CSN: [id number] Arrival date & time: 08/24/19  1757     History Chief Complaint  Patient presents with  . Hypertension    [name] is a 74 y.o. female presenting for evaluation of elevated blood pressure readings since yesterday, reporting systolic numbers consistently in the 190 range.  Additionally reports she woke with a frontal headache today which has been constant.  She denies chest pain, sob, vision changes or focal weakness.  She has taken her amlodipine this morning.  She only takes her metoprolol when she feels palpitations (for anxiety, stating she used to be on xanax and hydrocodone, pcp made her choose one to give up and she needs the hydrocodone for low back pain, so her xanax was dc'd) and metoprolol was given prn for anxiety episodes.  She does endorse sadness today, maybe some anxiety as well as this is her deceased husbands birthday and has been upset over this.    The history is provided by the patient.       Past Medical History:  Diagnosis Date  . Anxiety   . Arthritis   . Bronchiolitis    acute  . Chronic back pain   . Depression   . Diverticulosis   . GERD (gastroesophageal reflux disease)    negative H pylori stool Ag  . High cholesterol   . HTN (hypertension)   . Hyperplastic colon polyp 11/08/08  . IBS (irritable bowel syndrome)   . Tubular adenoma of colon   . Umbilical hernia   . Wears glasses     Patient Active Problem List   Diagnosis Date Noted  . Urinary tract infection with hematuria 05/19/2019  . Hematuria 05/19/2019  . Urinary frequency 05/19/2019  . Numbness and tingling of foot 02/23/2019  . BV (bacterial vaginosis) 12/04/2018  . Vaginal discharge 12/09/2017  . Vaginal atrophy 12/09/2017  . Heme positive stool 05/01/2016  . Abdominal pain 04/16/2016  . Constipation 05/26/2013  . Abdominal pain, other specified site 03/16/2013  . Mixed hyperlipidemia 12/19/2010  . Tubular  adenoma of colon 11/14/2010  . ABDOMINAL PAIN, LEFT LOWER QUADRANT 03/15/2010  . EDEMA 08/25/2009  . Musculoskeletal chest pain 08/25/2009  . GERD 10/28/2008  . HYPERTENSION 10/27/2008  . Irritable bowel syndrome 10/27/2008  . BACK PAIN, CHRONIC 10/27/2008  . DIARRHEA 10/27/2008  . ABDOMINAL CRAMPS 10/27/2008  . COLONIC POLYPS, HX OF 10/27/2008    Past Surgical History:  Procedure Laterality Date  . ABDOMINAL HYSTERECTOMY  1985  . APPENDECTOMY    . BIOPSY  05/09/2016   Procedure: BIOPSY;  Surgeon: Daneil Dolin, MD;  [facility];  Service: Endoscopy;;  bx ulcer at ileocecal valve  . BREAST SURGERY  1989   both sides  . CARPAL TUNNEL RELEASE Right 08/23/2013   Procedure: RIGHT ENDOSCOPIC CARPAL  TUNNEL RELEASE;  Surgeon: Jolyn Nap, MD;  Location: [place];  Service: Orthopedics;  Laterality: Right;  . CATARACT EXTRACTION, BILATERAL    . [place] SURGERY  2007  . CHOLECYSTECTOMY  1980  . COLONOSCOPY  11/08/08   Dr. Gala Romney- hemorrhoids, pancolonic diverticula,,hyperplastic polyp  . COLONOSCOPY   08/05/2005   [id number] rectum/Diminutive polyp at 48 cm/Pancolonic diverticula/benign-appearing ulcer in the mid descending colon  . COLONOSCOPY N/A 03/30/2013   [id number] anal canal hemorrhoids. Single colonic polyp-removed (tubular adenoma). Colonic diverticulosis.  . COLONOSCOPY N/A 05/09/2016   Dr. Gala Romney: diverticulosis in entire colon, mucosal ulceration at IC valve felt  to be related to NSAID effect, otherwise normal. due for surveillance in 2022.   . DORSAL COMPARTMENT RELEASE Left 12/12/2014   Procedure: LEFT WRIST RELEASE DORSAL COMPARTMENT (DEQUERVAIN);  Surgeon: Milly Jakob, MD;  Location: [place];  Service: Orthopedics;  Laterality: Left;  . ECTOPIC PREGNANCY SURGERY     bilat  . FOOT SURGERY Right 02/2017  . NEPHRECTOMY  1975   left-infection  . TRIGGER FINGER RELEASE Right 08/23/2013   Procedure: RIGHT INDEX FINGER  TRIGGER RELEASE ;  Surgeon: Jolyn Nap, MD;  Location: [place];  Service: Orthopedics;  Laterality: Right;     OB History    Gravida  4   Para  1   Term  1   Preterm      AB  3   Living  1     SAB      TAB      Ectopic      Multiple      Live Births              Family History  Problem Relation Age of Onset  . Cancer Brother 60       gastric  . Heart attack Mother   . Colon cancer Neg Hx   . Colon polyps Neg Hx     Social History   Tobacco Use  . Smoking status: Former Smoker    Packs/day: 0.25    Years: 30.00    Pack years: 7.50    Types: Cigarettes    Quit date: 08/19/2000    Years since quitting: 19.0  . Smokeless tobacco: Never Used  . Tobacco comment: "smoked but not inhaled"  Substance Use Topics  . Alcohol use: No    Alcohol/week: 0.0 standard drinks  . Drug use: No    Home Medications Prior to Admission medications   Medication Sig Start Date End Date Taking? Authorizing Provider  amLODipine-valsartan (EXFORGE) 5-160 MG per tablet Take 1 tablet by mouth daily.    [provider]  b complex vitamins tablet Take 1 tablet by mouth daily.    [provider]  BIOTIN PO Take by mouth daily.    [provider]  budesonide-formoterol (SYMBICORT) 80-4.5 MCG/ACT inhaler Inhale 2 puffs into the lungs as needed.     [provider]  clonazePAM (KLONOPIN) 0.5 MG tablet 0.25 mg as needed.  10/30/18   [provider]  conjugated estrogens (PREMARIN) vaginal cream INSERT HALF A GRAM IN VAGINA AT BEDTIME FOR 2 WEEKS THEN 2 X WEEKLY 12/24/18   Derrek Monaco A, NP  fexofenadine (ALLEGRA) 60 MG tablet Take 60 mg by mouth daily. Pt has not started yet    [provider]  furosemide (LASIX) 20 MG tablet Take 20 mg by mouth daily.  11/13/10   [provider]  gabapentin (NEURONTIN) 400 MG capsule Take 400 mg by mouth 3 (three) times daily.    [provider]   HYDROcodone-acetaminophen (NORCO/VICODIN) 5-325 MG tablet Take 1 tablet by mouth 2 (two) times daily. 04/06/16   [provider]  hyoscyamine (LEVSIN SL) 0.125 MG SL tablet Place 1 tablet (0.125 mg total) under the tongue every 4 (four) hours as needed. One under the tongue q 4 hrs as needed and at bedtime. 04/07/18   Rourk, Cristopher Estimable, MD  metoprolol tartrate (LOPRESSOR) 25 MG tablet Take 12.5 mg by mouth daily as needed (for anxiety).  07/29/17   [provider]  nitrofurantoin, macrocrystal-monohydrate, (  MACROBID) 100 MG capsule Take 1 capsule (100 mg total) by mouth 2 (two) times daily. 05/21/19   Estill Dooms, NP  omeprazole (PRILOSEC) 20 MG capsule Take 20 mg by mouth daily as needed. For acid reflux    [provider]  primidone (MYSOLINE) 50 MG tablet TAKE 1 TABLET BY MOUTH TWICE A DAY 03/18/19   Tat, Eustace Quail, DO  sulfamethoxazole-trimethoprim (BACTRIM DS) 800-160 MG tablet Take 1 tablet by mouth 2 (two) times daily. Take 1 bid 05/19/19   Derrek Monaco A, NP    Allergies    Morphine and related  Review of Systems   Review of Systems  Constitutional: Negative for chills and fever.  HENT: Negative for congestion and sore throat.   Eyes: Negative.  Negative for visual disturbance.  Respiratory: Negative for chest tightness and shortness of breath.   Cardiovascular: Negative for chest pain.  Gastrointestinal: Negative for abdominal pain, nausea and vomiting.  Genitourinary: Negative.   Musculoskeletal: Negative for arthralgias, joint swelling and neck pain.  Skin: Negative.  Negative for rash and wound.  Neurological: Positive for headaches. Negative for dizziness, weakness, light-headedness and numbness.  Psychiatric/Behavioral: Negative.     Physical Exam Updated Vital Signs BP (!) 168/96   Pulse 71   Temp 97.8 F (36.6 C) (Oral)   Resp 16   Ht 5\' 7"  (1.702 m)   SpO2 98%   BMI 26.47 kg/m   Physical Exam Vitals and nursing note  reviewed.  Constitutional:      Appearance: Normal appearance. She is well-developed.  HENT:     Head: Normocephalic and atraumatic.     Mouth/Throat:     Mouth: Mucous membranes are moist.  Eyes:     Conjunctiva/sclera: Conjunctivae normal.  Cardiovascular:     Rate and Rhythm: Normal rate and regular rhythm.     Heart sounds: Normal heart sounds.  Pulmonary:     Effort: Pulmonary effort is normal.     Breath sounds: Normal breath sounds. No wheezing, rhonchi or rales.  Abdominal:     General: Bowel sounds are normal.     Palpations: Abdomen is soft.     Tenderness: There is no abdominal tenderness.  Musculoskeletal:        General: Normal range of motion.     Cervical back: Normal range of motion.     Right lower leg: No edema.     Left lower leg: No edema.  Skin:    General: Skin is warm and dry.  Neurological:     General: No focal deficit present.     Mental Status: She is alert and oriented to person, place, and time.     Cranial Nerves: No cranial nerve deficit.     ED Results / Procedures / Treatments   Labs (all labs ordered are listed, but only abnormal results are displayed) Labs Reviewed  BASIC METABOLIC PANEL - Abnormal; Notable for the following components:      Result Value   GFR calc non Af Amer 59 (*)    All other components within normal limits  CBC WITH DIFFERENTIAL/PLATELET  TROPONIN I (HIGH SENSITIVITY)    EKG EKG Interpretation  Date/Time:  Tuesday August 24 2019 23:45:31 EST Ventricular Rate:  66 PR Interval:    QRS Duration: 119 QT Interval:  417 QTC Calculation: 437 R Axis:   59 Text Interpretation: Sinus rhythm Left atrial enlargement Nonspecific intraventricular conduction delay Nonspecific ST changes. no STEMI Confirmed by Nanda Quinton [phone]) on 08/24/2019  11:55:53 PM   Radiology DG Chest 2 View  Result Date: 08/24/2019 CLINICAL DATA:  Left chest pain EXAM: CHEST - 2 VIEW COMPARISON:  09/13/2015 FINDINGS: Dextroscoliosis of  the thoracic spine. No focal airspace consolidation or pulmonary edema. Normal cardiomediastinal contours. No pleural effusion or pneumothorax. IMPRESSION: No acute airspace disease. Electronically Signed   By: Ulyses Jarred M.D.   On: 08/24/2019 22:56    Procedures Procedures (including critical care time)  Medications Ordered in ED Medications  acetaminophen (TYLENOL) tablet 650 mg (650 mg Oral Given 08/25/19 0010)  cloNIDine (CATAPRES) tablet 0.1 mg (0.1 mg Oral Given 08/25/19 0010)    ED Course  I have reviewed the triage vital signs and the nursing notes.  Pertinent labs & imaging results that were available during my care of the patient were reviewed by me and considered in my medical decision making (see chart for details).    MDM Rules/Calculators/A&P                      Labs, ekg and cxr reviewed and discussed with pt, no acute findings with stable ekg tracing. She initially took her home metoprolol tablet 25 mg while awaiting blood test results. No significant improvement in bp or headache.  Added clonidine 0.1 mg and tylenol 650 mg.  Her headache resolved and her bp improved to 168/96. She did relay that she has let salt creep back into her diet. When husband was living, used salt substitute, and still does not add regular table salt to meals but has been eating salty snacks.  She was encouraged to cut back on this. Also advised to check blood pressure once daily, keep a record to discuss with her pcp.  She currently takes metoprolol prn only for anxiety induced palpitations.  Encouraged to take daily for the next week along with her amlodipine. Plan f/u with her pcp for a recheck in one week.     Final Clinical Impression(s) / ED Diagnoses Final diagnoses:  Essential hypertension    Rx / DC Orders ED Discharge Orders    None       Landis Martins 08/25/19 [id number]    Margette Fast, MD 08/25/19 2129

## 2019-08-24 NOTE — ED Triage Notes (Addendum)
Pt states her blood pressures have been high since last night and her pcp told her to come here to be evaluated. Pt is tearful in triage it is her last husband's birthday. Pt also c/o headache.

## 2019-08-25 LAB — TROPONIN I (HIGH SENSITIVITY): Troponin I (High Sensitivity): 2 ng/L (ref <18)

## 2019-08-25 NOTE — Discharge Instructions (Addendum)
Your lab tests, ekg and chest xray tonight are reassuring with no significant abnormal findings.  Check your blood pressure at home, not more than 1 time per day and keep a list to show your MD.  Try cutting back on salty foods ( chips as discussed) which may be contributing to your elevated blood pressure. I encourage you take your metoprolol daily for the next week for your blood pressure treatment (not just for anxiety).

## 2019-09-08 NOTE — Progress Notes (Signed)
Referring Provider: Haywood Pao, MD Primary Care Physician:  Haywood Pao, MD Primary GI: Dr. Gala Romney   Chief Complaint  Patient presents with  . Abdominal Pain    "stomach ache" since last night  . Diarrhea    having terrible time last week  . Gas    "a lot"    HPI:   [name] is a 74 y.o. female presenting today with a history of GERD, constipation, chronic abdominal pain. History of left lateral abdominal wall hernia s/p nephrectomy in remote past. Colonoscopy 2017 with diverticulosis in entire colon, mucosal ulceration at IC valve felt to be related to NSAID effect, otherwise normal. Due for surveillance in 2022.  Previously evaluated by Dr. Dalbert Batman July 2017 due to left flank pain and known left lateral abdominal incisional hernia. It was felt best not to operate at that time and have further evaluation with spine specialist. IF spine specialist felt that her disease was not culprit for pain, she could return to see Dr. Rosendo Gros for consideration of hernia repair, although this may not relieve pain. At last visit in March 2020, she was referred to Dr. Rosendo Gros and recommending medical treatment for nerve pain. 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   September 01, 2022 had spike in BP and tachycardia. This happened to be her husband's birthday. Started having diarrhea when she called for her appointment. Diarrhea has now stopped and back to constipation. Pain in side is gone currently. Had just mild discomfort once but overall this has resolved.  Had lost weight with husband's illness but gaining some back now. Amitiza 8 mcg has helped in the past. Sometimes coffee cramps her. Lots of gas. Feels constipated.   GERD: taking omeprazole as needed. Not taking every day. No rectal bleeding.    Past Medical History:  Diagnosis Date  . Anxiety   . Arthritis   . Bronchiolitis    acute  . Chronic back pain   . Depression   .  Diverticulosis   . GERD (gastroesophageal reflux disease)    negative H pylori stool Ag  . High cholesterol   . HTN (hypertension)   . Hyperplastic colon polyp 11/08/08  . IBS (irritable bowel syndrome)   . Tubular adenoma of colon   . Umbilical hernia   . Wears glasses     Past Surgical History:  Procedure Laterality Date  . ABDOMINAL HYSTERECTOMY  1985  . APPENDECTOMY    . BIOPSY  05/09/2016   Procedure: BIOPSY;  Surgeon: Daneil Dolin, MD;  [facility];  Service: Endoscopy;;  bx ulcer at ileocecal valve  . BREAST SURGERY  1989   both sides  . CARPAL TUNNEL RELEASE Right September 01, 2013   Procedure: RIGHT ENDOSCOPIC CARPAL  TUNNEL RELEASE;  Surgeon: Jolyn Nap, MD;  Location: [place];  Service: Orthopedics;  Laterality: Right;  . CATARACT EXTRACTION, BILATERAL    . [place] SURGERY  2007  . CHOLECYSTECTOMY  1980  . COLONOSCOPY  11/08/08   Dr. Gala Romney- hemorrhoids, pancolonic diverticula,,hyperplastic polyp  . COLONOSCOPY   08/05/2005   [id number] rectum/Diminutive polyp at 31 cm/Pancolonic diverticula/benign-appearing ulcer in the mid descending colon  . COLONOSCOPY N/A 03/30/2013   [id number] anal canal hemorrhoids. Single colonic polyp-removed (tubular adenoma). Colonic diverticulosis.  . COLONOSCOPY N/A 05/09/2016   Dr. Gala Romney: diverticulosis in entire colon, mucosal ulceration at IC valve felt to be related to  NSAID effect, otherwise normal. due for surveillance in 2022.   . DORSAL COMPARTMENT RELEASE Left 12/12/2014   Procedure: LEFT WRIST RELEASE DORSAL COMPARTMENT (DEQUERVAIN);  Surgeon: Milly Jakob, MD;  Location: [place];  Service: Orthopedics;  Laterality: Left;  . ECTOPIC PREGNANCY SURGERY     bilat  . FOOT SURGERY Right 02/2017  . NEPHRECTOMY  1975   left-infection  . TRIGGER FINGER RELEASE Right 08/23/2013   Procedure: RIGHT INDEX FINGER TRIGGER RELEASE ;  Surgeon: Jolyn Nap, MD;  Location: [place];  Service: Orthopedics;  Laterality: Right;    Current Outpatient Medications  Medication Sig Dispense Refill  . amLODipine-valsartan (EXFORGE) 5-160 MG per tablet Take 1 tablet by mouth daily.    [name] b complex vitamins tablet Take 1 tablet by mouth daily.    [name] BIOTIN PO Take by mouth daily.    [name] bismuth subsalicylate (PEPTO BISMOL) 262 MG/15ML suspension Take 30 mLs by mouth as needed.    . budesonide-formoterol (SYMBICORT) 80-4.5 MCG/ACT inhaler Inhale 2 puffs into the lungs as needed.     . furosemide (LASIX) 20 MG tablet Take 20 mg by mouth daily.     [name] HYDROcodone-acetaminophen (NORCO/VICODIN) 5-325 MG tablet Take 1 tablet by mouth 2 (two) times daily.    . hyoscyamine (LEVSIN SL) 0.125 MG SL tablet Place 1 tablet (0.125 mg total) under the tongue every 4 (four) hours as needed. One under the tongue q 4 hrs as needed and at bedtime. 40 tablet 3  . metoprolol tartrate (LOPRESSOR) 25 MG tablet Take 12.5 mg by mouth daily as needed (for anxiety).   5  . omeprazole (PRILOSEC) 20 MG capsule Take 20 mg by mouth daily as needed. For acid reflux    . primidone (MYSOLINE) 50 MG tablet TAKE 1 TABLET BY MOUTH TWICE A DAY 180 tablet 1  . Simethicone (GAS-X PO) Take by mouth as needed.     No current facility-administered medications for this visit.    Allergies as of 09/09/2019 - Review Complete 09/09/2019  Allergen Reaction Noted  . Morphine and related Hypertension 11/11/2013    Family History  Problem Relation Age of Onset  . Cancer Brother 60       gastric  . Heart attack Mother   . Colon cancer Neg Hx   . Colon polyps Neg Hx     Social History   Socioeconomic History  . Marital status: Widowed    Spouse name: [name]  . Number of children: 1  . Years of education: some coll.  . Highest education level: Not on file  Occupational History  . 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 Smoking status: Former Smoker     Packs/day: 0.25    Years: 30.00    Pack years: 7.50    Types: Cigarettes    Quit date: 08/19/2000    Years since quitting: 19.0  . Smokeless tobacco: Never Used  . Tobacco comment: "smoked but not inhaled"  Substance and Sexual Activity  . Alcohol use: No    Alcohol/week: 0.0 standard drinks  . Drug use: No  . Sexual activity: Not Currently    Birth control/protection: Surgical    Comment: hyst  Other Topics Concern  . Not on file  Social History Narrative   Consumes 2 cups of caffeine daily   Social Determinants of Health   Financial Resource Strain:   . Difficulty of  Paying Living Expenses: Not on file  Food Insecurity:   . Worried About Charity fundraiser in the Last Year: Not on file  . Ran Out of Food in the Last Year: Not on file  Transportation Needs:   . Lack of Transportation (Medical): Not on file  . Lack of Transportation (Non-Medical): Not on file  Physical Activity:   . Days of Exercise per Week: Not on file  . Minutes of Exercise per Session: Not on file  Stress:   . Feeling of Stress : Not on file  Social Connections:   . Frequency of Communication with Friends and Family: Not on file  . Frequency of Social Gatherings with Friends and Family: Not on file  . Attends Religious Services: Not on file  . Active Member of Clubs or Organizations: Not on file  . Attends Archivist Meetings: Not on file  . Marital Status: Not on file    Review of Systems: Gen: Denies fever, chills, anorexia. Denies fatigue, weakness, weight loss.  CV: Denies chest pain, palpitations, syncope, peripheral edema, and claudication. Resp: Denies dyspnea at rest, cough, wheezing, coughing up blood, and pleurisy. GI: see HPI Derm: Denies rash, itching, dry skin Psych: Denies depression, anxiety, memory loss, confusion. No homicidal or suicidal ideation.  Heme: Denies bruising, bleeding, and enlarged lymph nodes.  Physical Exam: BP 138/87   Pulse 86   Temp (!) 97.3 F  (36.3 C) (Oral)   Ht 5\' 7"  (1.702 m)   Wt 171 lb (77.6 kg)   BMI 26.78 kg/m  General:   Alert and oriented. No distress noted. Pleasant and cooperative.  Head:  Normocephalic and atraumatic. Eyes:  Conjuctiva clear without scleral icterus. Abdomen:  +BS, soft, non-tender and non-distended. No rebound or guarding. Left-sided abdomen with fullness greater than right Msk:  Symmetrical without gross deformities. Normal posture. Extremities:  Without edema. Neurologic:  Alert and  oriented x4 Psych:  Alert and cooperative. Normal mood and affect.  ASSESSMENT: [name] is a 74 y.o. female presenting today with history of chronic abdominal pain, known left lateral abdominal wall hernia s/p nephrectomy in the past, GERD, presenting today in follow-up.  Constipation: recently with bout of diarrhea but now resolved. Will resume Amitiza 8 mcg daily to BID. Samples provided.  GERD: well-managed with omeprazole just prn. No alarm signs/symptoms.  Abdominal pain: resolved at this point. No need for surgical evaluation regarding hernia unless recurrent. Continue with Neurology follow-up.    PLAN:   Amitiza 8 mcg po BID, may take daily if this works best. Samples provided  Prilosec prn  Return in 6-8 months  Colonoscopy in 2022  Annitta Needs, PhD, ANP-BC [facility]

## 2019-09-09 ENCOUNTER — Encounter: Payer: Self-pay | Admitting: Gastroenterology

## 2019-09-09 ENCOUNTER — Ambulatory Visit: Payer: Medicare PPO | Admitting: Gastroenterology

## 2019-09-09 ENCOUNTER — Other Ambulatory Visit: Payer: Self-pay

## 2019-09-09 VITALS — BP 138/87 | HR 86 | Temp 97.3°F | Ht 67.0 in | Wt 171.0 lb

## 2019-09-09 DIAGNOSIS — K5900 Constipation, unspecified: Secondary | ICD-10-CM | POA: Diagnosis not present

## 2019-09-09 DIAGNOSIS — K219 Gastro-esophageal reflux disease without esophagitis: Secondary | ICD-10-CM | POA: Diagnosis not present

## 2019-09-09 NOTE — Patient Instructions (Signed)
I have given samples of Amitiza that you can take twice a day with food to avoid nausea. Let me know if you want a prescription for this! You could titrate it down to just one a day if that works better.  We will see you in 6-8 months or sooner if needed!  I enjoyed seeing you again today! As you know, I value our relationship and want to provide genuine, compassionate, and quality care. I welcome your feedback. If you receive a survey regarding your visit,  I greatly appreciate you taking time to fill this out. See you next time!  Annitta Needs, PhD, ANP-BC [facility]

## 2019-09-12 ENCOUNTER — Other Ambulatory Visit: Payer: Self-pay | Admitting: Neurology

## 2019-11-01 ENCOUNTER — Telehealth: Payer: Self-pay | Admitting: Adult Health

## 2019-11-01 NOTE — Telephone Encounter (Signed)
Patient called, has questions about the medication Anderson Malta prescribed her, Metronidazole Vaginal Gel.  [phone]

## 2019-11-02 NOTE — Telephone Encounter (Signed)
Pt reports having same symptoms as the last time she had bv. She still has some metrogel on hand. I advised that she could try using this and if it doesn't improve to let us know. Patient has no other questions at this time.

## 2019-12-28 ENCOUNTER — Other Ambulatory Visit: Payer: Self-pay | Admitting: Internal Medicine

## 2019-12-29 NOTE — Telephone Encounter (Signed)
Is patient still taking this medication. Looks like at last visit in March 2021, abdominal pain had resolved and she was having constipation with plans to continue Amitiza.

## 2019-12-30 ENCOUNTER — Encounter: Payer: Self-pay | Admitting: Gastroenterology

## 2019-12-30 NOTE — Telephone Encounter (Signed)
Noted.  Rx sent.

## 2019-12-30 NOTE — Telephone Encounter (Signed)
Lmom, waiting on a return call.  

## 2019-12-30 NOTE — Telephone Encounter (Signed)
Spoke with pt. Pt would like a refill to have on hand. Pt doesn't take hyoscyamine everyday but as needed when she has an abdominal flare up.

## 2020-01-06 NOTE — Progress Notes (Signed)
Assessment/Plan:    1.  Essential Tremor  increase primidone, 50 mg, 2 in the AM, 1 at night.  Subjective:   [name] was seen today in follow up for essential tremor.  My previous records were reviewed prior to todays visit. When she doesn't have good sleep or doesn't eat well, she will note increased tremor.  Pt had one fall and her foot caught on the step on the porch outside and she hurt her knee and hand.  Pt denies lightheadedness, near syncope.  No hallucinations.  Having some pain in the feet.  She wonders if it is neuropathy.  Has seen orthopedics and had EMG and was told it was not neuropathy.  Current prescribed movement disorder medications: Primidone, 50 mg twice per day   ALLERGIES:   Allergies  Allergen Reactions  . Morphine And Related Hypertension    CURRENT MEDICATIONS:  Outpatient Encounter Medications as of 01/11/2020  Medication Sig  . amLODipine-valsartan (EXFORGE) 5-160 MG per tablet Take 1 tablet by mouth daily.  [name] b complex vitamins tablet Take 1 tablet by mouth daily.  [name] BIOTIN PO Take by mouth daily.  [name] bismuth subsalicylate (PEPTO BISMOL) 262 MG/15ML suspension Take 30 mLs by mouth as needed.  . budesonide-formoterol (SYMBICORT) 80-4.5 MCG/ACT inhaler Inhale 2 puffs into the lungs as needed.   . furosemide (LASIX) 20 MG tablet Take 20 mg by mouth daily.   [name] HYDROcodone-acetaminophen (NORCO/VICODIN) 5-325 MG tablet Take 1 tablet by mouth 2 (two) times daily.  . hyoscyamine (LEVSIN SL) 0.125 MG SL tablet DISSOLVE ONE TAB UNDER TONGUE EVERY 4 HOURS AS NEEDED AND AT BEDTIME  . methocarbamol (ROBAXIN) 500 MG tablet Take once a day  Patient taking half tab bid  . metoprolol tartrate (LOPRESSOR) 25 MG tablet Take 12.5 mg by mouth daily as needed (for anxiety).   [name] omeprazole (PRILOSEC) 20 MG capsule Take 20 mg by mouth daily as needed. For acid reflux  . primidone (MYSOLINE) 50 MG tablet TAKE 1 TABLET BY MOUTH TWICE A DAY  . Simethicone (GAS-X PO)  Take by mouth as needed.   No facility-administered encounter medications on file as of 01/11/2020.     Objective:    PHYSICAL EXAMINATION:    VITALS:   Vitals:   01/11/20 1106  BP: (!) 152/88  Pulse: 71  SpO2: 99%  Weight: 168 lb (76.2 kg)  Height: 5\' 7"  (1.702 m)    GEN:  The patient appears stated age and is in NAD. HEENT:  Normocephalic, atraumatic.  The mucous membranes are moist. The superficial temporal arteries are without ropiness or tenderness. CV:  RRR Lungs:  CTAB Neck/HEME:  There are no carotid bruits bilaterally.  Neurological examination:  Orientation: The patient is alert and oriented x3. Cranial nerves: There is good facial symmetry. The speech is fluent and clear. Soft palate rises symmetrically and there is no tongue deviation. Hearing is intact to conversational tone. Sensation: Sensation is intact to light touch throughout Motor: Strength is at least antigravity x4.  Movement examination: Tone: There is normal tone in the UE/LE Abnormal movements: There is right upper extremity intention tremor that increases when given a weight.  Some trouble with Archimedes spirals on the right. Coordination:  There is no decremation with RAM's Gait and Station: The patient ambulates well in the hall. I have reviewed and interpreted the following labs independently   Chemistry      Component Value Date/Time   NA 139 08/24/2019 2149  K 3.7 08/24/2019 2149   CL 105 08/24/2019 2149   CO2 24 08/24/2019 2149   BUN 14 08/24/2019 2149   CREATININE 0.95 08/24/2019 2149      Component Value Date/Time   CALCIUM 9.5 08/24/2019 2149   ALKPHOS 75 04/07/2010 1931   AST 20 04/07/2010 1931   ALT 15 04/07/2010 1931   BILITOT 0.6 04/07/2010 1931      Lab Results  Component Value Date   WBC 6.1 08/24/2019   HGB 12.5 08/24/2019   HCT 40.7 08/24/2019   MCV 93.1 08/24/2019   PLT 216 08/24/2019   No results found for: TSH   Chemistry      Component Value Date/Time    NA 139 08/24/2019 2149   K 3.7 08/24/2019 2149   CL 105 08/24/2019 2149   CO2 24 08/24/2019 2149   BUN 14 08/24/2019 2149   CREATININE 0.95 08/24/2019 2149      Component Value Date/Time   CALCIUM 9.5 08/24/2019 2149   ALKPHOS 75 04/07/2010 1931   AST 20 04/07/2010 1931   ALT 15 04/07/2010 1931   BILITOT 0.6 04/07/2010 1931         Total time spent on today's visit was 20 minutes, including both face-to-face time and nonface-to-face time.  Time included that spent on review of records (prior notes available to me/labs/imaging if pertinent), discussing treatment and goals, answering patient's questions and coordinating care.  Cc:  Tisovec, Fransico Him, MD

## 2020-01-11 ENCOUNTER — Other Ambulatory Visit: Payer: Self-pay

## 2020-01-11 ENCOUNTER — Ambulatory Visit: Payer: Medicare PPO | Admitting: Neurology

## 2020-01-11 ENCOUNTER — Encounter: Payer: Self-pay | Admitting: Neurology

## 2020-01-11 VITALS — BP 152/88 | HR 71 | Ht 67.0 in | Wt 168.0 lb

## 2020-01-11 DIAGNOSIS — G250 Essential tremor: Secondary | ICD-10-CM

## 2020-01-11 MED ORDER — PRIMIDONE 50 MG PO TABS: ORAL_TABLET | ORAL | 5 refills | Status: DC

## 2020-01-11 NOTE — Patient Instructions (Addendum)
increase primidone, 50 mg, 2 in the AM, 1 at night.  The physicians and staff at [facility] are committed to providing excellent care. You may receive a survey requesting feedback about your experience at our office. We strive to receive "very good" responses to the survey questions. If you feel that your experience would prevent you from giving the office a "very good " response, please contact our office to try to remedy the situation. We may be reached at [phone]. Thank you for taking the time out of your busy day to complete the survey.

## 2020-01-18 ENCOUNTER — Other Ambulatory Visit: Payer: Self-pay | Admitting: Neurology

## 2020-02-01 DIAGNOSIS — I479 Paroxysmal tachycardia, unspecified: Secondary | ICD-10-CM | POA: Diagnosis not present

## 2020-02-01 DIAGNOSIS — M545 Low back pain: Secondary | ICD-10-CM | POA: Diagnosis not present

## 2020-02-01 DIAGNOSIS — M5416 Radiculopathy, lumbar region: Secondary | ICD-10-CM | POA: Diagnosis not present

## 2020-02-01 DIAGNOSIS — F418 Other specified anxiety disorders: Secondary | ICD-10-CM | POA: Diagnosis not present

## 2020-02-07 ENCOUNTER — Other Ambulatory Visit: Payer: Self-pay

## 2020-02-07 ENCOUNTER — Encounter (HOSPITAL_COMMUNITY): Payer: Self-pay

## 2020-02-07 DIAGNOSIS — Z87891 Personal history of nicotine dependence: Secondary | ICD-10-CM | POA: Insufficient documentation

## 2020-02-07 DIAGNOSIS — I10 Essential (primary) hypertension: Secondary | ICD-10-CM | POA: Insufficient documentation

## 2020-02-07 DIAGNOSIS — R519 Headache, unspecified: Secondary | ICD-10-CM | POA: Diagnosis not present

## 2020-02-07 DIAGNOSIS — I6523 Occlusion and stenosis of bilateral carotid arteries: Secondary | ICD-10-CM | POA: Diagnosis not present

## 2020-02-07 DIAGNOSIS — Z79899 Other long term (current) drug therapy: Secondary | ICD-10-CM | POA: Insufficient documentation

## 2020-02-07 DIAGNOSIS — R42 Dizziness and giddiness: Secondary | ICD-10-CM | POA: Diagnosis not present

## 2020-02-07 NOTE — ED Triage Notes (Signed)
Pt complains of htn for the last few days. States she has been taking prednisone. 175/118 in triage

## 2020-02-08 ENCOUNTER — Emergency Department (HOSPITAL_COMMUNITY): Payer: Medicare PPO

## 2020-02-08 ENCOUNTER — Emergency Department (HOSPITAL_COMMUNITY)
Admission: EM | Admit: 2020-02-08 | Discharge: 2020-02-08 | Disposition: A | Discharge status: Home / Self Care | Payer: Medicare PPO | Attending: Emergency Medicine | Admitting: Emergency Medicine

## 2020-02-08 DIAGNOSIS — I10 Essential (primary) hypertension: Secondary | ICD-10-CM

## 2020-02-08 DIAGNOSIS — R030 Elevated blood-pressure reading, without diagnosis of hypertension: Secondary | ICD-10-CM

## 2020-02-08 DIAGNOSIS — I6523 Occlusion and stenosis of bilateral carotid arteries: Secondary | ICD-10-CM | POA: Diagnosis not present

## 2020-02-08 LAB — CBC WITH DIFFERENTIAL/PLATELET
Abs Immature Granulocytes: 0.01 10*3/uL (ref 0.00–0.07)
Basophils Absolute: 0 10*3/uL (ref 0.0–0.1)
Basophils Relative: 0 %
Eosinophils Absolute: 0 10*3/uL (ref 0.0–0.5)
Eosinophils Relative: 1 %
HCT: 44.8 % (ref 36.0–46.0)
Hemoglobin: 13.9 g/dL (ref 12.0–15.0)
Immature Granulocytes: 0 %
Lymphocytes Relative: 45 %
Lymphs Abs: 3.3 10*3/uL (ref 0.7–4.0)
MCH: 29.1 pg (ref 26.0–34.0)
MCHC: 31 g/dL (ref 30.0–36.0)
MCV: 93.9 fL (ref 80.0–100.0)
Monocytes Absolute: 0.6 10*3/uL (ref 0.1–1.0)
Monocytes Relative: 9 %
Neutro Abs: 3.2 10*3/uL (ref 1.7–7.7)
Neutrophils Relative %: 45 %
Platelets: 290 10*3/uL (ref 150–400)
RBC: 4.77 MIL/uL (ref 3.87–5.11)
RDW: 12.6 % (ref 11.5–15.5)
WBC: 7.2 10*3/uL (ref 4.0–10.5)
nRBC: 0 % (ref 0.0–0.2)

## 2020-02-08 LAB — BASIC METABOLIC PANEL
Anion gap: 9 (ref 5–15)
BUN: 22 mg/dL (ref 8–23)
CO2: 28 mmol/L (ref 22–32)
Calcium: 9.5 mg/dL (ref 8.9–10.3)
Chloride: 102 mmol/L (ref 98–111)
Creatinine, Ser: 1.04 mg/dL — ABNORMAL HIGH (ref 0.44–1.00)
GFR calc Af Amer: >60 mL/min (ref >60)
GFR calc non Af Amer: 53 mL/min — ABNORMAL LOW (ref >60)
Glucose, Bld: 104 mg/dL — ABNORMAL HIGH (ref 70–99)
Potassium: 4.4 mmol/L (ref 3.5–5.1)
Sodium: 139 mmol/L (ref 135–145)

## 2020-02-08 LAB — TROPONIN I (HIGH SENSITIVITY)
Troponin I (High Sensitivity): 4 ng/L (ref <18)
Troponin I (High Sensitivity): 5 ng/L (ref <18)

## 2020-02-08 MED ORDER — METOPROLOL TARTRATE 25 MG PO TABS
25.0000 mg | ORAL_TABLET | Freq: Once | ORAL | Status: AC
  Administered 2020-02-08: 25 mg via ORAL
  Filled 2020-02-08: qty 1

## 2020-02-08 MED ORDER — IOHEXOL 350 MG/ML SOLN
75.0000 mL | Freq: Once | INTRAVENOUS | Status: AC | PRN
  Administered 2020-02-08: 75 mL via INTRAVENOUS

## 2020-02-08 NOTE — Discharge Instructions (Addendum)
Keep a record of your blood pressure and take your medications as prescribed.  Follow-up with your doctor for adjustments of your blood pressure medication.  Return to the ED with unilateral weakness, numbness, tingling, difficulty speaking, difficulty swallowing, chest pain, shortness of breath, any other concerns.

## 2020-02-08 NOTE — ED Provider Notes (Signed)
[facility] EMERGENCY DEPARTMENT Provider Note   CSN: [id number] Arrival date & time: 02/07/20  2151     History Chief Complaint  Patient presents with  . Hypertension    [name] is a 74 y.o. female.  Patient with history of hypertension presenting with elevated blood pressure and headache.  States she developed sudden onset right-sided headache yesterday while she was at church.  She states the headache did not come on suddenly and was quite severe.  It did seem to ease off throughout the day after she took her metoprolol.  Today she had a left-sided headache that was similar onset suddenly.  Her blood pressure has been elevated at home in the 1 36-1 80 range systolic.  She states compliance with her blood pressure medications include amlodipine/valsartan as well as metoprolol.  No recent missed doses.  She does take metoprolol on a daily basis it was listed as needed for anxiety.  She denies any blood thinner use.  No chest pain or shortness of breath.  States she is under a lot of stress because of the death of her husband around this time of the year.  She also has been on prednisone for the past week or so because of inflammation in her foot.  She is about to start stop the prednisone tomorrow.  Denies any focal weakness, numbness or tingling.  Denies any chest pain or shortness of breath.  Denies any visual changes.  No difficulty breathing or difficulty swallowing.  No fever.  States compliance with her blood pressure medications and no recent missed doses.  The history is provided by the patient.  Hypertension Associated symptoms include headaches. Pertinent negatives include no chest pain, no abdominal pain and no shortness of breath.       Past Medical History:  Diagnosis Date  . Anxiety   . Arthritis   . Bronchiolitis    acute  . Chronic back pain   . Depression   . Diverticulosis   . GERD (gastroesophageal reflux disease)    negative H pylori stool Ag  . High  cholesterol   . HTN (hypertension)   . Hyperplastic colon polyp 11/08/08  . IBS (irritable bowel syndrome)   . Tubular adenoma of colon   . Umbilical hernia   . Wears glasses     Patient Active Problem List   Diagnosis Date Noted  . Urinary tract infection with hematuria 05/19/2019  . Hematuria 05/19/2019  . Urinary frequency 05/19/2019  . Numbness and tingling of foot 02/23/2019  . BV (bacterial vaginosis) 12/04/2018  . Vaginal discharge 12/09/2017  . Vaginal atrophy 12/09/2017  . Heme positive stool 05/01/2016  . Abdominal pain 04/16/2016  . Constipation 05/26/2013  . Abdominal pain, other specified site 03/16/2013  . Mixed hyperlipidemia 12/19/2010  . Tubular adenoma of colon 11/14/2010  . ABDOMINAL PAIN, LEFT LOWER QUADRANT 03/15/2010  . EDEMA 08/25/2009  . Musculoskeletal chest pain 08/25/2009  . GERD 10/28/2008  . HYPERTENSION 10/27/2008  . Irritable bowel syndrome 10/27/2008  . BACK PAIN, CHRONIC 10/27/2008  . DIARRHEA 10/27/2008  . ABDOMINAL CRAMPS 10/27/2008  . COLONIC POLYPS, HX OF 10/27/2008    Past Surgical History:  Procedure Laterality Date  . ABDOMINAL HYSTERECTOMY  1985  . APPENDECTOMY    . BIOPSY  05/09/2016   Procedure: BIOPSY;  Surgeon: Daneil Dolin, MD;  [facility]: [facility];  Service: Endoscopy;;  bx ulcer at ileocecal valve  . BREAST SURGERY  1989   both sides  .  CARPAL TUNNEL RELEASE Right 08/23/2013   Procedure: RIGHT ENDOSCOPIC CARPAL  TUNNEL RELEASE;  Surgeon: Jolyn Nap, MD;  Location: [place];  Service: Orthopedics;  Laterality: Right;  . CATARACT EXTRACTION, BILATERAL    . [place] SURGERY  2007  . CHOLECYSTECTOMY  1980  . COLONOSCOPY  11/08/08   Dr. Gala Romney- hemorrhoids, pancolonic diverticula,,hyperplastic polyp  . COLONOSCOPY   08/05/2005   HYQ:MVHQIO rectum/Diminutive polyp at 65 cm/Pancolonic diverticula/benign-appearing ulcer in the mid descending colon  . COLONOSCOPY N/A 03/30/2013   NGE:XBMWUXL anal  canal hemorrhoids. Single colonic polyp-removed (tubular adenoma). Colonic diverticulosis.  . COLONOSCOPY N/A 05/09/2016   Dr. Gala Romney: diverticulosis in entire colon, mucosal ulceration at IC valve felt to be related to NSAID effect, otherwise normal. due for surveillance in 2022.   . DORSAL COMPARTMENT RELEASE Left 12/12/2014   Procedure: LEFT WRIST RELEASE DORSAL COMPARTMENT (DEQUERVAIN);  Surgeon: Milly Jakob, MD;  Location: [place];  Service: Orthopedics;  Laterality: Left;  . ECTOPIC PREGNANCY SURGERY     bilat  . FOOT SURGERY Right 02/2017  . NEPHRECTOMY  1975   left-infection  . TRIGGER FINGER RELEASE Right 08/23/2013   Procedure: RIGHT INDEX FINGER TRIGGER RELEASE ;  Surgeon: Jolyn Nap, MD;  Location: [place];  Service: Orthopedics;  Laterality: Right;     OB History    Gravida  4   Para  1   Term  1   Preterm      AB  3   Living  1     SAB      TAB      Ectopic      Multiple      Live Births              Family History  Problem Relation Age of Onset  . Cancer Brother 60       gastric  . Heart attack Mother   . Colon cancer Neg Hx   . Colon polyps Neg Hx     Social History   Tobacco Use  . Smoking status: Former Smoker    Packs/day: 0.25    Years: 30.00    Pack years: 7.50    Types: Cigarettes    Quit date: 08/19/2000    Years since quitting: 19.4  . Smokeless tobacco: Never Used  . Tobacco comment: "smoked but not inhaled"  Vaping Use  . Vaping Use: Never used  Substance Use Topics  . Alcohol use: No    Alcohol/week: 0.0 standard drinks  . Drug use: No    Home Medications Prior to Admission medications   Medication Sig Start Date End Date Taking? Authorizing Provider  amLODipine-valsartan (EXFORGE) 5-160 MG per tablet Take 1 tablet by mouth daily.    [provider]  b complex vitamins tablet Take 1 tablet by mouth daily.    [provider]  BIOTIN PO Take by mouth daily.     [provider]  bismuth subsalicylate (PEPTO BISMOL) 262 MG/15ML suspension Take 30 mLs by mouth as needed.    [provider]  budesonide-formoterol (SYMBICORT) 80-4.5 MCG/ACT inhaler Inhale 2 puffs into the lungs as needed.     [provider]  furosemide (LASIX) 20 MG tablet Take 20 mg by mouth daily.  11/13/10   [provider]  HYDROcodone-acetaminophen (NORCO/VICODIN) 5-325 MG tablet Take 1 tablet by mouth 2 (two) times daily. 04/06/16   [provider]  hyoscyamine (LEVSIN SL) 0.125 MG  SL tablet DISSOLVE ONE TAB UNDER TONGUE EVERY 4 HOURS AS NEEDED AND AT BEDTIME 12/30/19   Erenest Rasher, PA-C  methocarbamol (ROBAXIN) 500 MG tablet Take once a day  Patient taking half tab bid 12/10/19   [provider]  metoprolol tartrate (LOPRESSOR) 25 MG tablet Take 12.5 mg by mouth daily as needed (for anxiety).  07/29/17   [provider]  omeprazole (PRILOSEC) 20 MG capsule Take 20 mg by mouth daily as needed. For acid reflux    [provider]  primidone (MYSOLINE) 50 MG tablet TAKE 2 TABLETS BY MOUTH IN THE AM AND 1 TABLET IN THE PM 01/18/20   Tat, Eustace Quail, DO  Simethicone (GAS-X PO) Take by mouth as needed.    [provider]    Allergies    Morphine and related  Review of Systems   Review of Systems  Constitutional: Negative for activity change, appetite change and fever.  HENT: Negative for congestion and rhinorrhea.   Eyes: Negative for visual disturbance.  Respiratory: Negative for chest tightness, shortness of breath and wheezing.   Cardiovascular: Negative for chest pain.  Gastrointestinal: Negative for abdominal pain, nausea and vomiting.  Genitourinary: Negative for decreased urine volume, dysuria and hematuria.  Musculoskeletal: Negative for arthralgias and myalgias.  Skin: Negative for rash.  Neurological: Positive for dizziness and headaches. Negative for weakness and light-headedness.   all other  systems are negative except as noted in the HPI and PMH.   Physical Exam Updated Vital Signs BP (!) 175/118   Pulse 83   Temp 98.4 F (36.9 C)   Resp 20   Ht 5\' 7"  (1.702 m)   Wt 77.1 kg   SpO2 98%   BMI 26.63 kg/m   Physical Exam Vitals and nursing note reviewed.  Constitutional:      General: She is not in acute distress.    Appearance: She is well-developed.  HENT:     Head: Normocephalic and atraumatic.     Mouth/Throat:     Pharynx: No oropharyngeal exudate.  Eyes:     Conjunctiva/sclera: Conjunctivae normal.     Pupils: Pupils are equal, round, and reactive to light.  Neck:     Comments: No meningismus. Cardiovascular:     Rate and Rhythm: Normal rate and regular rhythm.     Heart sounds: Normal heart sounds. No murmur heard.   Pulmonary:     Effort: Pulmonary effort is normal. No respiratory distress.     Breath sounds: Normal breath sounds.  Abdominal:     Palpations: Abdomen is soft.     Tenderness: There is no abdominal tenderness. There is no guarding or rebound.  Musculoskeletal:        General: No tenderness. Normal range of motion.     Cervical back: Normal range of motion and neck supple.  Skin:    General: Skin is warm.  Neurological:     Mental Status: She is alert and oriented to person, place, and time.     Cranial Nerves: No cranial nerve deficit.     Motor: No abnormal muscle tone.     Coordination: Coordination normal.     Comments: CN 2-12 intact, no ataxia on finger to nose, no nystagmus, 5/5 strength throughout, no pronator drift, Romberg negative, normal gait.  Intention tremor bilaterally which she states is chronic  Psychiatric:        Behavior: Behavior normal.     ED Results / Procedures / Treatments   Labs (  all labs ordered are listed, but only abnormal results are displayed) Labs Reviewed  BASIC METABOLIC PANEL - Abnormal; Notable for the following components:      Result Value   Glucose, Bld 104 (*)    Creatinine, Ser  1.04 (*)    GFR calc non Af Amer 53 (*)    All other components within normal limits  CBC WITH DIFFERENTIAL/PLATELET  TROPONIN I (HIGH SENSITIVITY)  TROPONIN I (HIGH SENSITIVITY)    EKG EKG Interpretation  Date/Time:  Tuesday February 08 2020 05:31:25 EDT Ventricular Rate:  64 PR Interval:    QRS Duration: 112 QT Interval:  405 QTC Calculation: 418 R Axis:   40 Text Interpretation: Sinus rhythm Biatrial enlargement RSR' in V1 or V2, right VCD or RVH Left ventricular hypertrophy Baseline wander in lead(s) I V6 No significant change was found Confirmed by Ezequiel Essex [phone]) on 02/08/2020 5:45:56 AM   Radiology No results found.  Procedures Procedures (including critical care time)  Medications Ordered in ED Medications - No data to display  ED Course  I have reviewed the triage vital signs and the nursing notes.  Pertinent labs & imaging results that were available during my care of the patient were reviewed by me and considered in my medical decision making (see chart for details).    MDM Rules/Calculators/A&P                         Elevated blood pressure with sudden onset headache yesterday as well as today.  Her neurological exam is nonfocal.  Blood pressure has improved to 150/100 on recheck. Given her description of thunderclap onset headache, will proceed with CT angiogram to rule out subarachnoid hemorrhage..  She does not take any blood thinners.  Labs are reassuring.  Home metoprolol was given.  Blood pressure 150/100 on recheck.  She declines any medication for headache.  Given her report of sudden onset headache, CT will be obtained to rule out evidence of aneurysm or bleeding.  Her neurological exam is nonfocal.  Care to be transferred at shift change.  Anticipate home with blood pressure management and further evaluation of her elevated blood pressure by her PCP.  Advised to keep a record of blood pressure.  Return to the ED with worsening headache,  unilateral weakness, numbness, tingling, difficulty speaking, difficulty swallowing, chest pain, any other concerns.   Final Clinical Impression(s) / ED Diagnoses Final diagnoses:  Elevated blood pressure reading    Rx / DC Orders ED Discharge Orders    None       Suvi Archuletta, Annie Main, MD 02/08/20 [phone]

## 2020-02-08 NOTE — ED Provider Notes (Signed)
7:45 AM-patient presented for evaluation of high blood pressure and headache.  CT angiogram brain ordered to evaluate for complications.  Clinical Course as of Feb 08 824  Tue Feb 08, 2020  0822 Troponin I (High Sensitivity) [EW]  [phone] Normal delta troponin  Troponin I (High Sensitivity) [EW]  [id number] Normal except slight elevation of glucose and creatinine with low GFR  Basic metabolic panel(!) [EW]  7092 Normal  CBC with Differential/Platelet [EW]  [id number] Per radiologist, no large vessel occlusion or aneurysm.  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  CT Angio Head W or Wo Contrast [EW]    Clinical Course User Index [EW] Daleen Bo, MD    9:36 AM-she is alert and comfortable and states her headache is better.  She reports that she takes metoprolol 25 mg, twice daily for stress.  She has been having headache for 2 days.  Currently, no dysarthria, aphasia, reported dizziness or abnormal behavior.  Patient appears calm comfortable.  Last blood pressure, at 8:40 AM 156/94.  Findings discussed with the patient and all questions were answered.  I recommended that she continue her regular medications, standard low-salt diet, and follow-up with her PCP for blood pressure check in 3 to 4 days.   Daleen Bo, MD 02/08/20 [phone]

## 2020-02-18 DIAGNOSIS — F418 Other specified anxiety disorders: Secondary | ICD-10-CM | POA: Diagnosis not present

## 2020-02-18 DIAGNOSIS — I10 Essential (primary) hypertension: Secondary | ICD-10-CM | POA: Diagnosis not present

## 2020-02-18 DIAGNOSIS — G479 Sleep disorder, unspecified: Secondary | ICD-10-CM | POA: Diagnosis not present

## 2020-02-18 DIAGNOSIS — Z131 Encounter for screening for diabetes mellitus: Secondary | ICD-10-CM | POA: Diagnosis not present

## 2020-03-20 DIAGNOSIS — F418 Other specified anxiety disorders: Secondary | ICD-10-CM | POA: Diagnosis not present

## 2020-03-20 DIAGNOSIS — G479 Sleep disorder, unspecified: Secondary | ICD-10-CM | POA: Diagnosis not present

## 2020-03-20 DIAGNOSIS — I10 Essential (primary) hypertension: Secondary | ICD-10-CM | POA: Diagnosis not present

## 2020-03-21 ENCOUNTER — Ambulatory Visit: Payer: Medicare PPO | Admitting: Nurse Practitioner

## 2020-03-21 ENCOUNTER — Other Ambulatory Visit: Payer: Self-pay

## 2020-03-21 ENCOUNTER — Ambulatory Visit: Payer: Medicare PPO | Admitting: Gastroenterology

## 2020-03-21 ENCOUNTER — Encounter: Payer: Self-pay | Admitting: Nurse Practitioner

## 2020-03-21 VITALS — BP 129/85 | HR 67 | Temp 97.7°F | Ht 67.0 in | Wt 168.0 lb

## 2020-03-21 DIAGNOSIS — K219 Gastro-esophageal reflux disease without esophagitis: Secondary | ICD-10-CM

## 2020-03-21 DIAGNOSIS — R109 Unspecified abdominal pain: Secondary | ICD-10-CM | POA: Diagnosis not present

## 2020-03-21 DIAGNOSIS — R197 Diarrhea, unspecified: Secondary | ICD-10-CM | POA: Diagnosis not present

## 2020-03-21 DIAGNOSIS — K581 Irritable bowel syndrome with constipation: Secondary | ICD-10-CM | POA: Diagnosis not present

## 2020-03-21 NOTE — Progress Notes (Signed)
Referring Provider: Haywood Pao, MD Primary Care Physician:  Haywood Pao, MD Primary GI:  Dr. Gala Romney  Chief Complaint  Patient presents with  . Follow-up    still having some problems with stomach at times    HPI:   [name] is a 74 y.o. female who presents for follow-up.  Patient was last seen in our office 09/09/2019 for GERD and constipation.  Noted history of GERD, constipation, chronic abdominal pain.  Also history of lateral abdominal wall hernia status post nephrectomy in the remote past.  Colonoscopy in 2017 with diverticulosis in the entire colon and mucosal ulceration at the IC valve felt likely NSAID effect.  Due for surveillance in 2022.  Evaluated by Dr. Dalbert Batman with surgery in July 2017 for left lateral abdominal incisional hernia felt best to not operate.  Recommended follow-up with spine specialist and if her disease was not culprit for pain could see Dr. Rosendo Gros for consideration of hernia repair although she was cautioned this may not relieve her pain.  Surgery was put on hold due to caregiver for her husband who is since passed away.  Called our office February 16 with diarrhea that has since resolved and is now back to constipation.  Flank pain/lateral abdominal pain resolved for now with only one incident recently.  Has lost weight with her husband's illness but has started putting weight back on.  Amitiza 8 mcg effective in the past.  Does feel constipated.  Takes omeprazole for GERD but not every day.  Recommended Amitiza 8 mcg twice daily, Prilosec as needed, follow-up in 6 to 8 months, colonoscopy in 2022.  Today she states she doing okay overall. She had a recent flare of her stomach issues after eating out at a restaurant and had salmon that has some seasoning on it. She had flare of GERD, had diarrhea for 2 days; stools now almost formed. Previously with constipation. Amitiza has caused diarrhea at times, so doesn't take it very often. Instead she has  increased her dietary fiber. Drinking plenty of water. GERD generally well controlled otherwise, uses omeprazole as needed. Also drinks honey tea and apple cider vinegar which helps as well. Denies other/ongoing abdominal pain, N/V, hematochezia, melena, fever, chills, unintentional weight loss. Denies URI or flu-like symptoms. Denies loss of sense of taste or smell. The patient has received COVID-19 vaccination(s). Denies chest pain, dyspnea, dizziness, lightheadedness, syncope, near syncope. Denies any other upper or lower GI symptoms.  Past Medical History:  Diagnosis Date  . Anxiety   . Arthritis   . Bronchiolitis    acute  . Chronic back pain   . Depression   . Diverticulosis   . GERD (gastroesophageal reflux disease)    negative H pylori stool Ag  . High cholesterol   . HTN (hypertension)   . Hyperplastic colon polyp 11/08/08  . IBS (irritable bowel syndrome)   . Tubular adenoma of colon   . Umbilical hernia   . Wears glasses     Past Surgical History:  Procedure Laterality Date  . ABDOMINAL HYSTERECTOMY  1985  . APPENDECTOMY    . BIOPSY  05/09/2016   Procedure: BIOPSY;  Surgeon: Daneil Dolin, MD;  [facility];  Service: Endoscopy;;  bx ulcer at ileocecal valve  . BREAST SURGERY  1989   both sides  . CARPAL TUNNEL RELEASE Right 08/23/2013   Procedure: RIGHT ENDOSCOPIC CARPAL  TUNNEL RELEASE;  Surgeon: Jolyn Nap, MD;  Location: [facility];  Service: Orthopedics;  Laterality: Right;  . CATARACT EXTRACTION, BILATERAL    . [place] SURGERY  2007  . CHOLECYSTECTOMY  1980  . COLONOSCOPY  11/08/08   Dr. Gala Romney- hemorrhoids, pancolonic diverticula,,hyperplastic polyp  . COLONOSCOPY   08/05/2005   AYT:KZSWFU rectum/Diminutive polyp at 90 cm/Pancolonic diverticula/benign-appearing ulcer in the mid descending colon  . COLONOSCOPY N/A 03/30/2013   XNA:TFTDDUK anal canal hemorrhoids. Single colonic polyp-removed (tubular adenoma). Colonic  diverticulosis.  . COLONOSCOPY N/A 05/09/2016   Dr. Gala Romney: diverticulosis in entire colon, mucosal ulceration at IC valve felt to be related to NSAID effect, otherwise normal. due for surveillance in 2022.   . DORSAL COMPARTMENT RELEASE Left 12/12/2014   Procedure: LEFT WRIST RELEASE DORSAL COMPARTMENT (DEQUERVAIN);  Surgeon: Milly Jakob, MD;  Location: [place];  Service: Orthopedics;  Laterality: Left;  . ECTOPIC PREGNANCY SURGERY     bilat  . FOOT SURGERY Right 02/2017  . NEPHRECTOMY  1975   left-infection  . TRIGGER FINGER RELEASE Right 08/23/2013   Procedure: RIGHT INDEX FINGER TRIGGER RELEASE ;  Surgeon: Jolyn Nap, MD;  Location: [place];  Service: Orthopedics;  Laterality: Right;    Current Outpatient Medications  Medication Sig Dispense Refill  . amLODipine-valsartan (EXFORGE) 5-160 MG per tablet Take 1 tablet by mouth daily.    [name] b complex vitamins tablet Take 1 tablet by mouth daily.    [name] BIOTIN PO Take by mouth daily.    [name] bismuth subsalicylate (PEPTO BISMOL) 262 MG/15ML suspension Take 30 mLs by mouth as needed.    . budesonide-formoterol (SYMBICORT) 80-4.5 MCG/ACT inhaler Inhale 2 puffs into the lungs as needed.     [name] escitalopram (LEXAPRO) 10 MG tablet Take 5 mg by mouth at bedtime.    . furosemide (LASIX) 20 MG tablet Take 20 mg by mouth daily.     [name] HYDROcodone-acetaminophen (NORCO/VICODIN) 5-325 MG tablet Take 1 tablet by mouth 2 (two) times daily.    . hyoscyamine (LEVSIN SL) 0.125 MG SL tablet DISSOLVE ONE TAB UNDER TONGUE EVERY 4 HOURS AS NEEDED AND AT BEDTIME 40 tablet 1  . methocarbamol (ROBAXIN) 500 MG tablet Take once a day  Patient taking half tab bid    . metoprolol tartrate (LOPRESSOR) 25 MG tablet Take 12.5 mg by mouth daily as needed (for anxiety).   5  . omeprazole (PRILOSEC) 20 MG capsule Take 20 mg by mouth daily as needed. For acid reflux    . primidone (MYSOLINE) 50 MG tablet TAKE 2 TABLETS BY MOUTH IN THE AM AND 1  TABLET IN THE PM 270 tablet 1  . Simethicone (GAS-X PO) Take by mouth as needed.     No current facility-administered medications for this visit.    Allergies as of 03/21/2020 - Review Complete 03/21/2020  Allergen Reaction Noted  . Morphine and related Hypertension 11/11/2013    Family History  Problem Relation Age of Onset  . Cancer Brother 60       gastric  . Heart attack Mother   . Colon cancer Neg Hx   . Colon polyps Neg Hx     Social History   Socioeconomic History  . Marital status: Widowed    Spouse name: [name]  . Number of children: 1  . Years of education: some coll.  . Highest education level: Not on file  Occupational History  . 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  Smoking status: Former Smoker    Packs/day: 0.25    Years: 30.00    Pack years: 7.50    Types: Cigarettes    Quit date: 08/19/2000    Years since quitting: 19.6  . Smokeless tobacco: Never Used  . Tobacco comment: "smoked but not inhaled"  Vaping Use  . Vaping Use: Never used  Substance and Sexual Activity  . Alcohol use: No    Alcohol/week: 0.0 standard drinks  . Drug use: No  . Sexual activity: Not Currently    Birth control/protection: Surgical    Comment: hyst  Other Topics Concern  . Not on file  Social History Narrative   Consumes 2 cups of caffeine daily   Social Determinants of Health   Financial Resource Strain:   . Difficulty of Paying Living Expenses: Not on file  Food Insecurity:   . Worried About Charity fundraiser in the Last Year: Not on file  . Ran Out of Food in the Last Year: Not on file  Transportation Needs:   . Lack of Transportation (Medical): Not on file  . Lack of Transportation (Non-Medical): Not on file  Physical Activity:   . Days of Exercise per Week: Not on file  . Minutes of Exercise per Session: Not on file  Stress:   . Feeling of Stress : Not on file  Social Connections:   . Frequency of  Communication with Friends and Family: Not on file  . Frequency of Social Gatherings with Friends and Family: Not on file  . Attends Religious Services: Not on file  . Active Member of Clubs or Organizations: Not on file  . Attends Archivist Meetings: Not on file  . Marital Status: Not on file    Subjective: Review of Systems  Constitutional: Negative for chills, fever, malaise/fatigue and weight loss.  HENT: Negative for congestion and sore throat.   Respiratory: Negative for cough and shortness of breath.   Cardiovascular: Negative for chest pain and palpitations.  Gastrointestinal: Positive for heartburn. Negative for abdominal pain, blood in stool, constipation, diarrhea, melena, nausea and vomiting.  Musculoskeletal: Negative for joint pain and myalgias.  Skin: Negative for rash.  Neurological: Negative for dizziness and weakness.  Endo/Heme/Allergies: Does not bruise/bleed easily.  Psychiatric/Behavioral: Negative for depression. The patient is not nervous/anxious.   All other systems reviewed and are negative.    Objective: BP 129/85   Pulse 67   Temp 97.7 F (36.5 C) (Temporal)   Ht 5\' 7"  (1.702 m)   Wt 168 lb (76.2 kg)   BMI 26.31 kg/m  Physical Exam Vitals and nursing note reviewed.  Constitutional:      General: She is not in acute distress.    Appearance: Normal appearance. She is well-developed. She is not ill-appearing, toxic-appearing or diaphoretic.  HENT:     Head: Normocephalic and atraumatic.     Nose: No congestion or rhinorrhea.  Eyes:     General: No scleral icterus. Cardiovascular:     Rate and Rhythm: Normal rate and regular rhythm.     Heart sounds: Normal heart sounds.  Pulmonary:     Effort: Pulmonary effort is normal. No respiratory distress.     Breath sounds: Normal breath sounds.  Abdominal:     General: Bowel sounds are normal.     Palpations: Abdomen is soft. There is no hepatomegaly, splenomegaly or mass.     Tenderness:  There is no abdominal tenderness. There is no guarding or rebound.  Hernia: No hernia is present.  Skin:    General: Skin is warm and dry.     Coloration: Skin is not jaundiced.     Findings: No rash.  Neurological:     General: No focal deficit present.     Mental Status: She is alert and oriented to person, place, and time.  Psychiatric:        Attention and Perception: Attention normal.        Mood and Affect: Mood normal.        Speech: Speech normal.        Behavior: Behavior normal.        Thought Content: Thought content normal.        Cognition and Memory: Cognition and memory normal.      Assessment:  Very pleasant 74 year old female who presents for follow-up on GERD and IBS.  Typically has IBS constipation type.  However, end of last week she ate out at a restaurant and had subsequent flare in GERD as well as diarrhea.  This self resolved after about 24 to 48 hours.  Likely food intolerance.  Diarrhea has calmed down.  Typically has constipation, uses Amitiza occasionally.  Otherwise she is increased her dietary fiber and water intake to help with her symptoms which is doing well most of the time.  Continues on omeprazole as needed for GERD, typical treatments include herbal teas and apple cider vinegar which she feels works quite well.  Her previous chronic abdominal pain is quiescent at this time.  I have recommended that she continue her current medications, let us know if she has any recurrent symptoms.  She is due for likely 1 last colonoscopy next year, she is unsure if she wants to have this done.  We deferred discussion until her follow-up visit.  Plan: 1. Continue current medications 2. Call us for any worsening symptoms 3. Discuss colonoscopy at follow-up visit, likely would be due in November 2022 and with likely be her last screening exam 4. Follow-up in 6 months    Thank you for allowing [place] to participate in the care of [place], DNP,  AGNP-C Adult & Gerontological Nurse Practitioner [facility]   03/21/2020 10:13 AM   Disclaimer: This note was dictated with voice recognition software. Similar sounding words can inadvertently be transcribed and may not be corrected upon review.

## 2020-03-21 NOTE — Patient Instructions (Addendum)
Your health issues we discussed today were:   IBS constipation type with a recent flare in diarrhea: 1. I am glad you are doing better 2. As we discussed, you likely just ate something that did not agree with you 3. Continue taking Amitiza as needed, otherwise continue your other home treatments that have been managing your symptoms well 4. Call us for any worsening or severe symptoms  GERD (reflux/heartburn): 1. Continue omeprazole as needed 2. Let us know if you have any worsening or severe symptoms  Overall I recommend:  1. Continue your other current medications 2. Return for follow-up in 6 months 3. We can discuss a colonoscopy sometime next year 4. Call us if you have any questions or concerns   ---------------------------------------------------------------  I am glad you have gotten your COVID-19 vaccination!  Even though you are fully vaccinated you should continue to follow CDC and state/local guidelines.  ---------------------------------------------------------------   At [facility] we value your feedback. You may receive a survey about your visit today. Please share your experience as we strive to create trusting relationships with our patients to provide genuine, compassionate, quality care.  We appreciate your understanding and patience as we review any laboratory studies, imaging, and other diagnostic tests that are ordered as we care for you. Our office policy is 5 business days for review of these results, and any emergent or urgent results are addressed in a timely manner for your best interest. If you do not hear from our office in 1 week, please contact us.   We also encourage the use of MyChart, which contains your medical information for your review as well. If you are not enrolled in this feature, an access code is on this after visit summary for your convenience. Thank you for allowing [place] to be involved in your care.  It was great to see you  today!  I hope you have a great rest of your summer!!

## 2020-03-22 NOTE — Progress Notes (Signed)
Cc'ed to pcp °

## 2020-04-03 DIAGNOSIS — I10 Essential (primary) hypertension: Secondary | ICD-10-CM | POA: Diagnosis not present

## 2020-04-03 DIAGNOSIS — E7800 Pure hypercholesterolemia, unspecified: Secondary | ICD-10-CM | POA: Diagnosis not present

## 2020-04-10 ENCOUNTER — Other Ambulatory Visit (HOSPITAL_COMMUNITY): Payer: Self-pay | Admitting: Internal Medicine

## 2020-04-10 DIAGNOSIS — Z23 Encounter for immunization: Secondary | ICD-10-CM | POA: Diagnosis not present

## 2020-04-10 DIAGNOSIS — D638 Anemia in other chronic diseases classified elsewhere: Secondary | ICD-10-CM | POA: Diagnosis not present

## 2020-04-10 DIAGNOSIS — M791 Myalgia, unspecified site: Secondary | ICD-10-CM | POA: Diagnosis not present

## 2020-04-10 DIAGNOSIS — R82998 Other abnormal findings in urine: Secondary | ICD-10-CM | POA: Diagnosis not present

## 2020-04-10 DIAGNOSIS — I700 Atherosclerosis of aorta: Secondary | ICD-10-CM | POA: Diagnosis not present

## 2020-04-10 DIAGNOSIS — R109 Unspecified abdominal pain: Secondary | ICD-10-CM | POA: Diagnosis not present

## 2020-04-10 DIAGNOSIS — I10 Essential (primary) hypertension: Secondary | ICD-10-CM | POA: Diagnosis not present

## 2020-04-10 DIAGNOSIS — Z0000 Encounter for general adult medical examination without abnormal findings: Secondary | ICD-10-CM | POA: Diagnosis not present

## 2020-04-10 DIAGNOSIS — J449 Chronic obstructive pulmonary disease, unspecified: Secondary | ICD-10-CM | POA: Diagnosis not present

## 2020-04-10 DIAGNOSIS — I479 Paroxysmal tachycardia, unspecified: Secondary | ICD-10-CM | POA: Diagnosis not present

## 2020-04-10 DIAGNOSIS — Z1231 Encounter for screening mammogram for malignant neoplasm of breast: Secondary | ICD-10-CM

## 2020-04-10 DIAGNOSIS — R0789 Other chest pain: Secondary | ICD-10-CM | POA: Diagnosis not present

## 2020-04-24 ENCOUNTER — Ambulatory Visit (HOSPITAL_COMMUNITY)
Admission: RE | Admit: 2020-04-24 | Discharge: 2020-04-24 | Disposition: A | Discharge status: Home / Self Care | Payer: Medicare PPO | Source: Ambulatory Visit | Attending: Internal Medicine | Admitting: Internal Medicine

## 2020-04-24 ENCOUNTER — Other Ambulatory Visit: Payer: Self-pay

## 2020-04-24 DIAGNOSIS — Z1231 Encounter for screening mammogram for malignant neoplasm of breast: Secondary | ICD-10-CM | POA: Diagnosis not present

## 2020-05-28 DIAGNOSIS — R0602 Shortness of breath: Secondary | ICD-10-CM | POA: Diagnosis not present

## 2020-05-28 DIAGNOSIS — R059 Cough, unspecified: Secondary | ICD-10-CM | POA: Diagnosis not present

## 2020-05-28 DIAGNOSIS — R918 Other nonspecific abnormal finding of lung field: Secondary | ICD-10-CM | POA: Diagnosis not present

## 2020-05-28 DIAGNOSIS — Z20822 Contact with and (suspected) exposure to covid-19: Secondary | ICD-10-CM | POA: Diagnosis not present

## 2020-05-28 DIAGNOSIS — J439 Emphysema, unspecified: Secondary | ICD-10-CM | POA: Diagnosis not present

## 2020-05-28 DIAGNOSIS — R109 Unspecified abdominal pain: Secondary | ICD-10-CM | POA: Diagnosis not present

## 2020-05-28 DIAGNOSIS — J189 Pneumonia, unspecified organism: Secondary | ICD-10-CM | POA: Diagnosis not present

## 2020-06-13 ENCOUNTER — Telehealth: Payer: Self-pay | Admitting: *Deleted

## 2020-06-13 ENCOUNTER — Telehealth: Payer: Self-pay | Admitting: Adult Health

## 2020-06-13 MED ORDER — FLUCONAZOLE 150 MG PO TABS: ORAL_TABLET | ORAL | 1 refills | Status: DC

## 2020-06-13 NOTE — Telephone Encounter (Signed)
LMOVM returning patient's call.  

## 2020-06-13 NOTE — Telephone Encounter (Signed)
Patient states she has recently finished a course of antibiotics and thinks she has a yeast infection.  She is requesting Diflucan.  Advised to check back with pharmacy later today.

## 2020-06-13 NOTE — Telephone Encounter (Signed)
Will rx diflucan  

## 2020-06-13 NOTE — Telephone Encounter (Signed)
Patient called stating that she has 4 antibiotics to left and is having a reaction( irritation near the vaginal area). Patient wants to know what her next steps should be, per patient. Clinical staff will follow up with patient.

## 2020-06-13 NOTE — Addendum Note (Signed)
Addended by: Derrek Monaco A on: 06/13/2020 02:58 PM   Modules accepted: Orders

## 2020-06-23 DIAGNOSIS — J449 Chronic obstructive pulmonary disease, unspecified: Secondary | ICD-10-CM | POA: Diagnosis not present

## 2020-06-23 DIAGNOSIS — R5383 Other fatigue: Secondary | ICD-10-CM | POA: Diagnosis not present

## 2020-06-23 DIAGNOSIS — I10 Essential (primary) hypertension: Secondary | ICD-10-CM | POA: Diagnosis not present

## 2020-06-23 DIAGNOSIS — J209 Acute bronchitis, unspecified: Secondary | ICD-10-CM | POA: Diagnosis not present

## 2020-06-23 DIAGNOSIS — R059 Cough, unspecified: Secondary | ICD-10-CM | POA: Diagnosis not present

## 2020-06-26 DIAGNOSIS — Z1212 Encounter for screening for malignant neoplasm of rectum: Secondary | ICD-10-CM | POA: Diagnosis not present

## 2020-07-14 ENCOUNTER — Ambulatory Visit: Payer: Medicare PPO | Admitting: Neurology

## 2020-07-14 DIAGNOSIS — K432 Incisional hernia without obstruction or gangrene: Secondary | ICD-10-CM | POA: Diagnosis not present

## 2020-07-15 ENCOUNTER — Other Ambulatory Visit: Payer: Medicare PPO

## 2020-07-15 DIAGNOSIS — Z20822 Contact with and (suspected) exposure to covid-19: Secondary | ICD-10-CM | POA: Diagnosis not present

## 2020-07-18 LAB — NOVEL CORONAVIRUS, NAA: SARS-CoV-2, NAA: NOT DETECTED

## 2020-07-19 ENCOUNTER — Telehealth: Payer: Self-pay | Admitting: Internal Medicine

## 2020-07-19 NOTE — Telephone Encounter (Signed)
Patient saw her covid test result in My Chart

## 2020-08-09 NOTE — Progress Notes (Signed)
Assessment/Plan:    1.  Essential Tremor  -Continue primidone, 50 mg, 2 tablets in the morning, 1 tablet at night  Subjective:   [name] was seen today in follow up for essential tremor.  My previous records were reviewed prior to todays visit. Tremor has been pretty good, unless she is stressed or rushing. Pt states that her BP was increased but she thinks that this was related to stress in her life.  Had pneumonia in November.  Memory not perfect but much better than after her husband first died.   Current prescribed movement disorder medications: Primidone, 50 mg, 2 tablets in the morning, 1 at night    ALLERGIES:   Allergies  Allergen Reactions  . Morphine And Related Hypertension    CURRENT MEDICATIONS:  Outpatient Encounter Medications as of 08/11/2020  Medication Sig  . amLODipine-valsartan (EXFORGE) 5-160 MG per tablet Take 1 tablet by mouth daily.  [name] b complex vitamins tablet Take 1 tablet by mouth daily.  [name] BIOTIN PO Take by mouth daily.  . budesonide-formoterol (SYMBICORT) 80-4.5 MCG/ACT inhaler Inhale 2 puffs into the lungs as needed.   [name] escitalopram (LEXAPRO) 10 MG tablet Take 5 mg by mouth at bedtime.  . fluconazole (DIFLUCAN) 150 MG tablet Take 1 now and 1 in 3 days  . fluvastatin (LESCOL) 20 MG capsule Take 20 mg by mouth at bedtime.  . furosemide (LASIX) 20 MG tablet Take 20 mg by mouth daily.  [name] HYDROcodone-acetaminophen (NORCO/VICODIN) 5-325 MG tablet Take 1 tablet by mouth 2 (two) times daily.  . hyoscyamine (LEVSIN SL) 0.125 MG SL tablet DISSOLVE ONE TAB UNDER TONGUE EVERY 4 HOURS AS NEEDED AND AT BEDTIME  . methocarbamol (ROBAXIN) 500 MG tablet Take once a day  Patient taking half tab bid  . metoprolol tartrate (LOPRESSOR) 25 MG tablet Take 12.5 mg by mouth daily as needed (for anxiety).   [name] omeprazole (PRILOSEC) 20 MG capsule Take 20 mg by mouth daily as needed. For acid reflux  . primidone (MYSOLINE) 50 MG tablet TAKE 2 TABLETS BY MOUTH IN THE  AM AND 1 TABLET IN THE PM  . Simethicone (GAS-X PO) Take by mouth as needed.  [name] VITAMIN D, CHOLECALCIFEROL, PO Take by mouth daily.   No facility-administered encounter medications on file as of 08/11/2020.     Objective:    PHYSICAL EXAMINATION:    VITALS:   Vitals:   08/11/20 1438  BP: 110/60  Pulse: 79  Resp: 18  SpO2: 95%  Weight: 163 lb (73.9 kg)  Height: 5\' 7"  (1.702 m)    GEN:  The patient appears stated age and is in NAD. HEENT:  Normocephalic, atraumatic.  The mucous membranes are moist. The superficial temporal arteries are without ropiness or tenderness. CV:  RRR Lungs:  CTAB Neck/HEME:  There are no carotid bruits bilaterally.  Neurological examination:  Orientation: The patient is alert and oriented x3. Cranial nerves: There is good facial symmetry. The speech is fluent and clear. Soft palate rises symmetrically and there is no tongue deviation. Hearing is intact to conversational tone. Sensation: Sensation is intact to light touch throughout Motor: Strength is at least antigravity x4.  Movement examination: Tone: There is normal tone in the UE/LE Abnormal movements: mild tremor on the R (postural) Coordination:  There is no decremation with RAM's  I have reviewed and interpreted the following labs independently   Chemistry      Component Value Date/Time   NA 139 02/07/2020 2347  K 4.4 02/07/2020 2347   CL 102 02/07/2020 2347   CO2 28 02/07/2020 2347   BUN 22 02/07/2020 2347   CREATININE 1.04 (H) 02/07/2020 2347      Component Value Date/Time   CALCIUM 9.5 02/07/2020 2347   ALKPHOS 75 04/07/2010 1931   AST 20 04/07/2010 1931   ALT 15 04/07/2010 1931   BILITOT 0.6 04/07/2010 1931      Lab Results  Component Value Date   WBC 7.2 02/07/2020   HGB 13.9 02/07/2020   HCT 44.8 02/07/2020   MCV 93.9 02/07/2020   PLT 290 02/07/2020   No results found for: TSH   Chemistry      Component Value Date/Time   NA 139 02/07/2020 2347   K 4.4  02/07/2020 2347   CL 102 02/07/2020 2347   CO2 28 02/07/2020 2347   BUN 22 02/07/2020 2347   CREATININE 1.04 (H) 02/07/2020 2347      Component Value Date/Time   CALCIUM 9.5 02/07/2020 2347   ALKPHOS 75 04/07/2010 1931   AST 20 04/07/2010 1931   ALT 15 04/07/2010 1931   BILITOT 0.6 04/07/2010 1931         Total time spent on today's visit was 20 minutes, including both face-to-face time and nonface-to-face time.  Time included that spent on review of records (prior notes available to me/labs/imaging if pertinent), discussing treatment and goals, answering patient's questions and coordinating care.  Cc:  Tisovec, Fransico Him, MD

## 2020-08-11 ENCOUNTER — Encounter: Payer: Self-pay | Admitting: Neurology

## 2020-08-11 ENCOUNTER — Ambulatory Visit: Payer: Medicare PPO | Admitting: Neurology

## 2020-08-11 ENCOUNTER — Other Ambulatory Visit: Payer: Self-pay

## 2020-08-11 VITALS — BP 110/60 | HR 79 | Resp 18 | Ht 67.0 in | Wt 163.0 lb

## 2020-08-11 DIAGNOSIS — G250 Essential tremor: Secondary | ICD-10-CM

## 2020-08-11 NOTE — Patient Instructions (Signed)
No changes in your medication.  You look great!  Let me know if you need medication  The physicians and staff at [facility] are committed to providing excellent care. You may receive a survey requesting feedback about your experience at our office. We strive to receive "very good" responses to the survey questions. If you feel that your experience would prevent you from giving the office a "very good " response, please contact our office to try to remedy the situation. We may be reached at [phone]. Thank you for taking the time out of your busy day to complete the survey.

## 2020-09-13 DIAGNOSIS — K432 Incisional hernia without obstruction or gangrene: Secondary | ICD-10-CM | POA: Diagnosis not present

## 2020-09-19 ENCOUNTER — Ambulatory Visit: Payer: Medicare PPO | Admitting: Nurse Practitioner

## 2020-09-19 ENCOUNTER — Other Ambulatory Visit: Payer: Self-pay

## 2020-09-19 ENCOUNTER — Telehealth: Payer: Self-pay

## 2020-09-19 ENCOUNTER — Encounter: Payer: Self-pay | Admitting: Nurse Practitioner

## 2020-09-19 VITALS — BP 132/78 | HR 82 | Temp 97.3°F | Ht 67.0 in | Wt 167.2 lb

## 2020-09-19 DIAGNOSIS — R197 Diarrhea, unspecified: Secondary | ICD-10-CM | POA: Diagnosis not present

## 2020-09-19 DIAGNOSIS — K219 Gastro-esophageal reflux disease without esophagitis: Secondary | ICD-10-CM

## 2020-09-19 DIAGNOSIS — K581 Irritable bowel syndrome with constipation: Secondary | ICD-10-CM | POA: Diagnosis not present

## 2020-09-19 DIAGNOSIS — K5900 Constipation, unspecified: Secondary | ICD-10-CM

## 2020-09-19 MED ORDER — PEG 3350-KCL-NA BICARB-NACL 420 G PO SOLR: 4000.0000 mL | ORAL | 0 refills | Status: DC

## 2020-09-19 NOTE — Telephone Encounter (Signed)
PA for TCS submitted via [employer] website. [employer]# [id number], valid 11/02/20-12/02/20.

## 2020-09-19 NOTE — Patient Instructions (Addendum)
Your health issues we discussed today were:   GERD (heartburn/reflux):  1. Glad you are doing well with this! 2. Continue using your omeprazole and apple cider vinegar to help with your symptoms 3. Call us for any worsening or severe symptoms  IBS with diarrhea and occasional constipation: 1. As we discussed, try to reduce your amount of caffeine/coffee 2. You can try a half dose of Imodium liquid to see if this helps reduce your diarrhea without causing constipation 3. Discussed other anxiety medicines that are nonsedating with your primary care doctor 4. Call us if you have any worsening or severe problems  Need for colonoscopy: 1. We will schedule colonoscopy for you 2. Further recommendations will follow your colonoscopy  Overall I recommend:  1. Continue other current medications 2. Return for follow-up in 6 months 3. Call us for any questions or concerns   ---------------------------------------------------------------  I am glad you have gotten your COVID-19 vaccination!  Even though you are fully vaccinated you should continue to follow CDC and state/local guidelines.  ---------------------------------------------------------------   At [facility] we value your feedback. You may receive a survey about your visit today. Please share your experience as we strive to create trusting relationships with our patients to provide genuine, compassionate, quality care.  We appreciate your understanding and patience as we review any laboratory studies, imaging, and other diagnostic tests that are ordered as we care for you. Our office policy is 5 business days for review of these results, and any emergent or urgent results are addressed in a timely manner for your best interest. If you do not hear from our office in 1 week, please contact us.   We also encourage the use of MyChart, which contains your medical information for your review as well. If you are not enrolled  in this feature, an access code is on this after visit summary for your convenience. Thank you for allowing [place] to be involved in your care.  It was great to see you today!  I hope you have a great spring!!

## 2020-09-19 NOTE — Progress Notes (Signed)
Referring Provider: Haywood Pao, MD Primary Care Physician:  Haywood Pao, MD Primary GI:  Dr. Gala Romney  Chief Complaint  Patient presents with  . Gastroesophageal Reflux    Doing okay. Drinks apple cider vinegar night  . Constipation    Comes/goes. Alternates with Diarrhea. Wants to discuss possible triggers    HPI:   [name] is a 75 y.o. female who presents for follow-up on GERD and constipation.  The patient was last seen in our office 03/21/2020 for the same as well as diarrhea and abdominal pain.  Previously diagnosed with IBS constipation type.  Noted history of GERD, IBS-C, chronic abdominal pain.  History of lateral abdominal wall hernia status post nephrectomy in the remote past.  Colonoscopy in 2017 with diverticulosis and mucosal ulceration to the IC valve likely NSAID effect, due for surveillance in 2022.  Surgical evaluation of her hernia felt best not to operate.  Previously recommended follow-up with spine specialist for possible etiology of her pain.  Previous on-call notes onset of diarrhea now resolved and back to constipation.  Recently lost her husband after prolonged illness and had subsequent weight loss, but now improving.  At her last visit noted recent flare of stomach issues after eating at a restaurant that had salmon and her symptoms included a flare of GERD and diarrhea for 2 days, now back to normal.  Amitiza is caused diarrhea at times so she does not take it very often but instead has increased dietary fiber.  Drinking plenty of water.  GERD well controlled on omeprazole as needed as well as honey tea and apple cider vinegar.  No other overt GI complaints.  Recommended continue current medications, notify us of any worsening, discuss colonoscopy at follow-up visit due in November 2022, follow-up in 6 months.  Today she states she doing okay overall. GERD doing well on apple cider vinegar with honey, lemon, ginger. Also on omeprazole. These  control her symptoms well. Constipation comes and goes with diarrhea; typically will have diarrhea if she drinks coffee. Also associated with anxiety. Out of 10 stools, feels most stools would be loose. This is chronic for her. Constipation she feels is mostly due to not drinking enough water. Hyoscyamine helps associated abdominal pain, but not diarrhea. Sometimes will use Pepto Bismol for diarrhea, but doesn't take it often cause it constipates her. Hasn't tried Imodium. She has had her gallbladder out. Denies N/V, hematochezia, melena, fever, chills. She has had some weight loss but thinks it's due to decreased appetite. Denies URI or flu-like symptoms. Denies loss of sense of taste or smell. The patient has received COVID-19 vaccination(s). They have had a booster as well. Denies chest pain, dyspnea, dizziness, lightheadedness, syncope, near syncope. Denies any other upper or lower GI symptoms.  Past Medical History:  Diagnosis Date  . Anxiety   . Arthritis   . Bronchiolitis    acute  . Chronic back pain   . Depression   . Diverticulosis   . GERD (gastroesophageal reflux disease)    negative H pylori stool Ag  . High cholesterol   . HTN (hypertension)   . Hyperplastic colon polyp 11/08/08  . IBS (irritable bowel syndrome)   . Tubular adenoma of colon   . Umbilical hernia   . Wears glasses     Past Surgical History:  Procedure Laterality Date  . ABDOMINAL HYSTERECTOMY  1985  . APPENDECTOMY    . BIOPSY  05/09/2016   Procedure: BIOPSY;  Surgeon:  Daneil Dolin, MD;  [facility];  Service: Endoscopy;;  bx ulcer at ileocecal valve  . BREAST SURGERY  1989   both sides  . CARPAL TUNNEL RELEASE Right 08/23/2013   Procedure: RIGHT ENDOSCOPIC CARPAL  TUNNEL RELEASE;  Surgeon: Jolyn Nap, MD;  Location: [facility];  Service: Orthopedics;  Laterality: Right;  . CATARACT EXTRACTION, BILATERAL    . [place] SURGERY  2007  . CHOLECYSTECTOMY  1980  .  COLONOSCOPY  11/08/08   Dr. Gala Romney- hemorrhoids, pancolonic diverticula,,hyperplastic polyp  . COLONOSCOPY   08/05/2005   AFB:XUXYBF rectum/Diminutive polyp at 63 cm/Pancolonic diverticula/benign-appearing ulcer in the mid descending colon  . COLONOSCOPY N/A 03/30/2013   XOV:ANVBTYO anal canal hemorrhoids. Single colonic polyp-removed (tubular adenoma). Colonic diverticulosis.  . COLONOSCOPY N/A 05/09/2016   Dr. Gala Romney: diverticulosis in entire colon, mucosal ulceration at IC valve felt to be related to NSAID effect, otherwise normal. due for surveillance in 2022.   . DORSAL COMPARTMENT RELEASE Left 12/12/2014   Procedure: LEFT WRIST RELEASE DORSAL COMPARTMENT (DEQUERVAIN);  Surgeon: Milly Jakob, MD;  Location: [facility];  Service: Orthopedics;  Laterality: Left;  . ECTOPIC PREGNANCY SURGERY     bilat  . FOOT SURGERY Right 02/2017  . NEPHRECTOMY  1975   left-infection  . TRIGGER FINGER RELEASE Right 08/23/2013   Procedure: RIGHT INDEX FINGER TRIGGER RELEASE ;  Surgeon: Jolyn Nap, MD;  Location: [facility];  Service: Orthopedics;  Laterality: Right;    Current Outpatient Medications  Medication Sig Dispense Refill  . amLODipine-valsartan (EXFORGE) 5-160 MG per tablet Take 1 tablet by mouth daily.    [name] b complex vitamins tablet Take 1 tablet by mouth daily.    [name] BIOTIN PO Take by mouth daily.    . budesonide-formoterol (SYMBICORT) 80-4.5 MCG/ACT inhaler Inhale 2 puffs into the lungs as needed.     . fluconazole (DIFLUCAN) 150 MG tablet Take 1 now and 1 in 3 days (Patient taking differently: Take 1 now and 1 in 3 days as needed) 2 tablet 1  . furosemide (LASIX) 20 MG tablet Take 20 mg by mouth daily.    [name] HYDROcodone-acetaminophen (NORCO/VICODIN) 5-325 MG tablet Take 1 tablet by mouth 2 (two) times daily. As needed    . hyoscyamine (LEVSIN SL) 0.125 MG SL tablet DISSOLVE ONE TAB UNDER TONGUE EVERY 4 HOURS AS NEEDED AND AT BEDTIME 40 tablet 1  . metoprolol  tartrate (LOPRESSOR) 25 MG tablet Take 12.5 mg by mouth daily as needed (for anxiety).   5  . omeprazole (PRILOSEC) 20 MG capsule Take 20 mg by mouth daily as needed. For acid reflux    . OVER THE COUNTER MEDICATION CBD OIL as needed    . primidone (MYSOLINE) 50 MG tablet TAKE 2 TABLETS BY MOUTH IN THE AM AND 1 TABLET IN THE PM 270 tablet 1  . Simethicone (GAS-X PO) Take by mouth as needed.    [name] VITAMIN D, CHOLECALCIFEROL, PO Take by mouth daily.     No current facility-administered medications for this visit.    Allergies as of 09/19/2020 - Review Complete 09/19/2020  Allergen Reaction Noted  . Morphine and related Hypertension 11/11/2013    Family History  Problem Relation Age of Onset  . Cancer Brother 60       gastric  . Heart attack Mother   . Colon cancer Neg Hx   . Colon polyps Neg Hx     Social History  Socioeconomic History  . Marital status: Widowed    Spouse name: [name]  . Number of children: 1  . Years of education: some coll.  . Highest education level: Not on file  Occupational History  . 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 Smoking status: Former Smoker    Packs/day: 0.25    Years: 30.00    Pack years: 7.50    Types: Cigarettes    Quit date: 08/19/2000    Years since quitting: 20.0  . Smokeless tobacco: Never Used  . Tobacco comment: "smoked but not inhaled"  Vaping Use  . Vaping Use: Never used  Substance and Sexual Activity  . Alcohol use: No    Alcohol/week: 0.0 standard drinks  . Drug use: No  . Sexual activity: Not Currently    Birth control/protection: Surgical    Comment: hyst  Other Topics Concern  . Not on file  Social History Narrative   Consumes 2 cups of caffeine daily   Left handed   One story home   Social Determinants of Health   Financial Resource Strain: Not on file  Food Insecurity: Not on file  Transportation Needs: Not on file  Physical Activity: Not on file   Stress: Not on file  Social Connections: Not on file    Subjective: Review of Systems  Constitutional: Negative for chills, fever, malaise/fatigue and weight loss.  HENT: Negative for congestion and sore throat.   Respiratory: Negative for cough and shortness of breath.   Cardiovascular: Negative for chest pain and palpitations.  Gastrointestinal: Positive for abdominal pain, constipation (rare) and diarrhea. Negative for blood in stool, melena, nausea and vomiting.  Musculoskeletal: Negative for joint pain and myalgias.  Skin: Negative for rash.  Neurological: Negative for dizziness and weakness.  Endo/Heme/Allergies: Does not bruise/bleed easily.  Psychiatric/Behavioral: Negative for depression. The patient is not nervous/anxious.   All other systems reviewed and are negative.    Objective: BP 132/78   Pulse 82   Temp (!) 97.3 F (36.3 C)   Ht _0  (1.702 m)   Wt 167 lb 3.2 oz (75.8 kg)   BMI 26.19 kg/m  Physical Exam Vitals and nursing note reviewed.  Constitutional:      General: She is not in acute distress.    Appearance: Normal appearance. She is well-developed and normal weight. She is not ill-appearing, toxic-appearing or diaphoretic.  HENT:     Head: Normocephalic and atraumatic.     Nose: No congestion or rhinorrhea.  Eyes:     General: No scleral icterus. Cardiovascular:     Rate and Rhythm: Normal rate and regular rhythm.     Heart sounds: Normal heart sounds.  Pulmonary:     Effort: Pulmonary effort is normal. No respiratory distress.     Breath sounds: Normal breath sounds.  Abdominal:     General: Bowel sounds are normal.     Palpations: Abdomen is soft. There is no hepatomegaly, splenomegaly or mass.     Tenderness: There is no abdominal tenderness. There is no guarding or rebound.     Hernia: No hernia is present.  Skin:    General: Skin is warm and dry.     Coloration: Skin is not jaundiced.     Findings: No rash.  Neurological:      General: No focal deficit present.     Mental Status: She is alert and oriented to person, place, and time.  Psychiatric:        Attention and Perception: Attention normal.        Mood and Affect: Mood normal.        Speech: Speech normal.        Behavior: Behavior normal.        Thought Content: Thought content normal.        Cognition and Memory: Cognition and memory normal.      Assessment:  Pleasant 75 year old female presents for follow-up on IBS with diarrhea and constipation as well as GERD.  No red flag/warning signs or symptoms.  Symptoms seem to be stable at this time.  No red flag/warning signs or symptoms.  IBS with primary diarrhea, occasional constipation: Irritable bowel syndrome, lots of anxiety.  Coffee tends to be a trigger of diarrhea and tries to avoid it on Sundays.  She is on Norco, was previously on Xanax.  Her primary care stopped the Xanax due to ongoing Norco.  We discussed following up with primary care for other antianxiety medications, although she feels that he would not likely do this.  SSRI, SNRI, or TCA could help with her anxiety and management of global IBS symptoms.  In the meantime I recommend she try a half dose Imodium as needed for diarrhea.  Continue other current medications.  GERD: Doing well on PPI and apple cider vinegar.  Recommend she continue her current regimen seems to be doing well for her.  Need for colonoscopy: She is due for colonoscopy.  Last completed in 2017 with recommended 5-year repeat.  We will proceed with scheduling at this time.  Proceed with colonoscopy on propofol/MAC by Dr. Gala Romney in near future: the risks, benefits, and alternatives have been discussed with the patient in detail. The patient states understanding and desires to proceed.  The patient is currently on Norco. The patient is not on any other anticoagulants, anxiolytics, chronic pain medications, antidepressants, antidiabetics, or iron supplements.  We will plan for  the procedure on propofol/MAC to promote adequate sedation  ASA II   Plan: 1. Colonoscopy as described above 2. Imodium half dose as needed for diarrhea 3. Continue other medications 4. Follow-up with primary care about anxiety issues 5. Follow-up in 6 months    Thank you for allowing [place] to participate in the care of [place], DNP, AGNP-C Adult & Gerontological Nurse Practitioner [facility]   09/19/2020 11:27 AM   Disclaimer: This note was dictated with voice recognition software. Similar sounding words can inadvertently be transcribed and may not be corrected upon review.

## 2020-09-26 DIAGNOSIS — I700 Atherosclerosis of aorta: Secondary | ICD-10-CM | POA: Diagnosis not present

## 2020-09-26 DIAGNOSIS — I10 Essential (primary) hypertension: Secondary | ICD-10-CM | POA: Diagnosis not present

## 2020-09-26 DIAGNOSIS — F418 Other specified anxiety disorders: Secondary | ICD-10-CM | POA: Diagnosis not present

## 2020-09-26 DIAGNOSIS — E7800 Pure hypercholesterolemia, unspecified: Secondary | ICD-10-CM | POA: Diagnosis not present

## 2020-10-03 ENCOUNTER — Other Ambulatory Visit: Payer: Self-pay | Admitting: Neurology

## 2020-10-03 NOTE — Telephone Encounter (Signed)
Rx(s) sent to pharmacy electronically.  

## 2020-10-12 ENCOUNTER — Encounter: Payer: Self-pay | Admitting: Internal Medicine

## 2020-10-23 DIAGNOSIS — G8929 Other chronic pain: Secondary | ICD-10-CM | POA: Diagnosis not present

## 2020-10-23 DIAGNOSIS — I10 Essential (primary) hypertension: Secondary | ICD-10-CM | POA: Diagnosis not present

## 2020-10-23 DIAGNOSIS — D638 Anemia in other chronic diseases classified elsewhere: Secondary | ICD-10-CM | POA: Diagnosis not present

## 2020-10-23 DIAGNOSIS — M791 Myalgia, unspecified site: Secondary | ICD-10-CM | POA: Diagnosis not present

## 2020-10-23 DIAGNOSIS — G479 Sleep disorder, unspecified: Secondary | ICD-10-CM | POA: Diagnosis not present

## 2020-10-23 DIAGNOSIS — E7800 Pure hypercholesterolemia, unspecified: Secondary | ICD-10-CM | POA: Diagnosis not present

## 2020-10-23 DIAGNOSIS — F418 Other specified anxiety disorders: Secondary | ICD-10-CM | POA: Diagnosis not present

## 2020-10-23 DIAGNOSIS — J449 Chronic obstructive pulmonary disease, unspecified: Secondary | ICD-10-CM | POA: Diagnosis not present

## 2020-10-23 DIAGNOSIS — I700 Atherosclerosis of aorta: Secondary | ICD-10-CM | POA: Diagnosis not present

## 2020-10-31 ENCOUNTER — Other Ambulatory Visit (HOSPITAL_COMMUNITY)
Admission: RE | Admit: 2020-10-31 | Discharge: 2020-10-31 | Disposition: A | Discharge status: Home / Self Care | Payer: Medicare PPO | Source: Ambulatory Visit | Attending: Internal Medicine | Admitting: Internal Medicine

## 2020-10-31 ENCOUNTER — Other Ambulatory Visit: Payer: Self-pay

## 2020-10-31 DIAGNOSIS — Z01812 Encounter for preprocedural laboratory examination: Secondary | ICD-10-CM | POA: Diagnosis not present

## 2020-10-31 DIAGNOSIS — Z20822 Contact with and (suspected) exposure to covid-19: Secondary | ICD-10-CM | POA: Diagnosis not present

## 2020-10-31 LAB — BASIC METABOLIC PANEL
Anion gap: 9 (ref 5–15)
BUN: 16 mg/dL (ref 8–23)
CO2: 26 mmol/L (ref 22–32)
Calcium: 8.7 mg/dL — ABNORMAL LOW (ref 8.9–10.3)
Chloride: 103 mmol/L (ref 98–111)
Creatinine, Ser: 0.98 mg/dL (ref 0.44–1.00)
GFR, Estimated: >60 mL/min (ref >60)
Glucose, Bld: 99 mg/dL (ref 70–99)
Potassium: 3.7 mmol/L (ref 3.5–5.1)
Sodium: 138 mmol/L (ref 135–145)

## 2020-11-01 LAB — SARS CORONAVIRUS 2 (TAT 6-24 HRS): SARS Coronavirus 2: NEGATIVE

## 2020-11-02 ENCOUNTER — Encounter (HOSPITAL_COMMUNITY)
Admission: RE | Disposition: A | Discharge status: Home / Self Care | Payer: Self-pay | Source: Home / Self Care | Attending: Internal Medicine

## 2020-11-02 ENCOUNTER — Other Ambulatory Visit: Payer: Self-pay

## 2020-11-02 ENCOUNTER — Encounter (HOSPITAL_COMMUNITY): Payer: Self-pay | Admitting: Internal Medicine

## 2020-11-02 ENCOUNTER — Ambulatory Visit (HOSPITAL_COMMUNITY): Payer: Medicare PPO | Admitting: Anesthesiology

## 2020-11-02 ENCOUNTER — Ambulatory Visit (HOSPITAL_COMMUNITY)
Admission: RE | Admit: 2020-11-02 | Discharge: 2020-11-02 | Disposition: A | Discharge status: Home / Self Care | Payer: Medicare PPO | Attending: Internal Medicine | Admitting: Internal Medicine

## 2020-11-02 DIAGNOSIS — Z9049 Acquired absence of other specified parts of digestive tract: Secondary | ICD-10-CM | POA: Diagnosis not present

## 2020-11-02 DIAGNOSIS — Z86010 Personal history of colonic polyps: Secondary | ICD-10-CM | POA: Diagnosis not present

## 2020-11-02 DIAGNOSIS — Z79899 Other long term (current) drug therapy: Secondary | ICD-10-CM | POA: Insufficient documentation

## 2020-11-02 DIAGNOSIS — K5730 Diverticulosis of large intestine without perforation or abscess without bleeding: Secondary | ICD-10-CM | POA: Insufficient documentation

## 2020-11-02 DIAGNOSIS — D123 Benign neoplasm of transverse colon: Secondary | ICD-10-CM | POA: Diagnosis not present

## 2020-11-02 DIAGNOSIS — D126 Benign neoplasm of colon, unspecified: Secondary | ICD-10-CM

## 2020-11-02 DIAGNOSIS — Z1211 Encounter for screening for malignant neoplasm of colon: Secondary | ICD-10-CM | POA: Diagnosis not present

## 2020-11-02 DIAGNOSIS — Z905 Acquired absence of kidney: Secondary | ICD-10-CM | POA: Insufficient documentation

## 2020-11-02 DIAGNOSIS — K640 First degree hemorrhoids: Secondary | ICD-10-CM | POA: Insufficient documentation

## 2020-11-02 DIAGNOSIS — Z7951 Long term (current) use of inhaled steroids: Secondary | ICD-10-CM | POA: Diagnosis not present

## 2020-11-02 DIAGNOSIS — Z885 Allergy status to narcotic agent status: Secondary | ICD-10-CM | POA: Insufficient documentation

## 2020-11-02 DIAGNOSIS — Z87891 Personal history of nicotine dependence: Secondary | ICD-10-CM | POA: Diagnosis not present

## 2020-11-02 DIAGNOSIS — K5900 Constipation, unspecified: Secondary | ICD-10-CM | POA: Insufficient documentation

## 2020-11-02 DIAGNOSIS — K635 Polyp of colon: Secondary | ICD-10-CM | POA: Diagnosis not present

## 2020-11-02 DIAGNOSIS — F418 Other specified anxiety disorders: Secondary | ICD-10-CM | POA: Diagnosis not present

## 2020-11-02 HISTORY — PX: POLYPECTOMY: SHX149

## 2020-11-02 HISTORY — PX: COLONOSCOPY WITH PROPOFOL: SHX5780

## 2020-11-02 SURGERY — COLONOSCOPY WITH PROPOFOL
Anesthesia: General

## 2020-11-02 MED ORDER — LIDOCAINE HCL (CARDIAC) PF 100 MG/5ML IV SOSY
PREFILLED_SYRINGE | INTRAVENOUS | Status: DC | PRN
  Administered 2020-11-02: 50 mg via INTRAVENOUS

## 2020-11-02 MED ORDER — LACTATED RINGERS IV SOLN
INTRAVENOUS | Status: DC
  Administered 2020-11-02: 1000 mL via INTRAVENOUS

## 2020-11-02 MED ORDER — STERILE WATER FOR IRRIGATION IR SOLN
Status: DC | PRN
  Administered 2020-11-02: 200 mL

## 2020-11-02 MED ORDER — PROPOFOL 10 MG/ML IV BOLUS
INTRAVENOUS | Status: DC | PRN
  Administered 2020-11-02: 100 mg via INTRAVENOUS

## 2020-11-02 MED ORDER — PROPOFOL 500 MG/50ML IV EMUL
INTRAVENOUS | Status: DC | PRN
  Administered 2020-11-02: 150 ug/kg/min via INTRAVENOUS

## 2020-11-02 NOTE — Anesthesia Preprocedure Evaluation (Signed)
Anesthesia Evaluation  Patient identified by MRN, date of birth, ID band Patient awake    Reviewed: Allergy & Precautions, NPO status , Patient's Chart, lab work & pertinent test results  History of Anesthesia Complications Negative for: history of anesthetic complications  Airway Mallampati: II  TM Distance: >3 FB Neck ROM: Full    Dental  (+) Dental Advisory Given, Partial Lower, Partial Upper, Missing   Pulmonary former smoker,    Pulmonary exam normal breath sounds clear to auscultation       Cardiovascular Exercise Tolerance: Good hypertension, Pt. on medications Normal cardiovascular exam Rhythm:Regular Rate:Normal     Neuro/Psych PSYCHIATRIC DISORDERS Anxiety Depression    GI/Hepatic Neg liver ROS, GERD  Medicated and Controlled,  Endo/Other  negative endocrine ROS  Renal/GU negative Renal ROS     Musculoskeletal  (+) Arthritis ,   Abdominal   Peds  Hematology negative hematology ROS (+)   Anesthesia Other Findings   Reproductive/Obstetrics                             Anesthesia Physical Anesthesia Plan  ASA: II  Anesthesia Plan: General   Post-op Pain Management:    Induction: Intravenous  PONV Risk Score and Plan: Propofol infusion  Airway Management Planned: Nasal Cannula and Natural Airway  Additional Equipment:   Intra-op Plan:   Post-operative Plan:   Informed Consent: I have reviewed the patients History and Physical, chart, labs and discussed the procedure including the risks, benefits and alternatives for the proposed anesthesia with the patient or authorized representative who has indicated his/her understanding and acceptance.       Plan Discussed with: CRNA and Surgeon  Anesthesia Plan Comments:         Anesthesia Quick Evaluation

## 2020-11-02 NOTE — Anesthesia Procedure Notes (Signed)
Date/Time: 11/02/2020 8:31 AM Performed by: Orlie Dakin, CRNA Pre-anesthesia Checklist: Patient identified, Emergency Drugs available, Suction available and Patient being monitored Patient Re-evaluated:Patient Re-evaluated prior to induction Oxygen Delivery Method: Nasal cannula Induction Type: IV induction Placement Confirmation: positive ETCO2

## 2020-11-02 NOTE — Discharge Instructions (Addendum)
Colonoscopy Discharge Instructions  Read the instructions outlined below and refer to this sheet in the next few weeks. These discharge instructions provide you with general information on caring for yourself after you leave the hospital. Your doctor may also give you specific instructions. While your treatment has been planned according to the most current medical practices available, unavoidable complications occasionally occur. If you have any problems or questions after discharge, call Dr. Gala Romney at [phone]. ACTIVITY  You may resume your regular activity, but move at a slower pace for the next 24 hours.   Take frequent rest periods for the next 24 hours.   Walking will help get rid of the air and reduce the bloated feeling in your belly (abdomen).   No driving for 24 hours (because of the medicine (anesthesia) used during the test).    Do not sign any important legal documents or operate any machinery for 24 hours (because of the anesthesia used during the test).  NUTRITION  Drink plenty of fluids.   You may resume your normal diet as instructed by your doctor.   Begin with a light meal and progress to your normal diet. Heavy or fried foods are harder to digest and may make you feel sick to your stomach (nauseated).   Avoid alcoholic beverages for 24 hours or as instructed.  MEDICATIONS  You may resume your normal medications unless your doctor tells you otherwise.  WHAT YOU CAN EXPECT TODAY  Some feelings of bloating in the abdomen.   Passage of more gas than usual.   Spotting of blood in your stool or on the toilet paper.  IF YOU HAD POLYPS REMOVED DURING THE COLONOSCOPY:  No aspirin products for 7 days or as instructed.   No alcohol for 7 days or as instructed.   Eat a soft diet for the next 24 hours.  FINDING OUT THE RESULTS OF YOUR TEST Not all test results are available during your visit. If your test results are not back during the visit, make an appointment  with your caregiver to find out the results. Do not assume everything is normal if you have not heard from your caregiver or the medical facility. It is important for you to follow up on all of your test results.  SEEK IMMEDIATE MEDICAL ATTENTION IF:  You have more than a spotting of blood in your stool.   Your belly is swollen (abdominal distention).   You are nauseated or vomiting.   You have a temperature over 101.   You have abdominal pain or discomfort that is severe or gets worse throughout the day.    2 polyps removed from your colon today  Information on polyps, diverticulosis and hemorrhoids provided  Further recommendations to follow pending review of pathology report  Office visit with [place] in 3 months--OFFICE TO [facility]  At patient request, I called Birdie Hopes at [phone] -reviewed findings and recommendations   Hemorrhoids Hemorrhoids are swollen veins that may develop:  In the butt (rectum). These are called internal hemorrhoids.  Around the opening of the butt (anus). These are called external hemorrhoids. Hemorrhoids can cause pain, itching, or bleeding. Most of the time, they do not cause serious problems. They usually get better with diet changes, lifestyle changes, and other home treatments. What are the causes? This condition may be caused by:  Having trouble pooping (constipation).  Pushing hard (straining) to poop.  Watery poop (diarrhea).  Pregnancy.  Being very overweight (obese).  Sitting for long  periods of time.  Heavy lifting or other activity that causes you to strain.  Anal sex.  Riding a bike for a long period of time. What are the signs or symptoms? Symptoms of this condition include:  Pain.  Itching or soreness in the butt.  Bleeding from the butt.  Leaking poop.  Swelling in the area.  One or more lumps around the opening of your butt. How is this diagnosed? A doctor can often diagnose this  condition by looking at the affected area. The doctor may also:  Do an exam that involves feeling the area with a gloved hand (digital rectal exam).  Examine the area inside your butt using a small tube (anoscope).  Order blood tests. This may be done if you have lost a lot of blood.  Have you get a test that involves looking inside the colon using a flexible tube with a camera on the end (sigmoidoscopy or colonoscopy). How is this treated? This condition can usually be treated at home. Your doctor may tell you to change what you eat, make lifestyle changes, or try home treatments. If these do not help, procedures can be done to remove the hemorrhoids or make them smaller. These may involve:  Placing rubber bands at the base of the hemorrhoids to cut off their blood supply.  Injecting medicine into the hemorrhoids to shrink them.  Shining a type of light energy onto the hemorrhoids to cause them to fall off.  Doing surgery to remove the hemorrhoids or cut off their blood supply. Follow these instructions at home: Eating and drinking  Eat foods that have a lot of fiber in them. These include whole grains, beans, nuts, fruits, and vegetables.  Ask your doctor about taking products that have added fiber (fibersupplements).  Reduce the amount of fat in your diet. You can do this by: ? Eating low-fat dairy products. ? Eating less red meat. ? Avoiding processed foods.  Drink enough fluid to keep your pee (urine) pale yellow.   Managing pain and swelling  Take a warm-water bath (sitz bath) for 20 minutes to ease pain. Do this 3-4 times a day. You may do this in a bathtub or using a portable sitz bath that fits over the toilet.  If told, put ice on the painful area. It may be helpful to use ice between your warm baths. ? Put ice in a plastic bag. ? Place a towel between your skin and the bag. ? Leave the ice on for 20 minutes, 2-3 times a day.   General instructions  Take  over-the-counter and prescription medicines only as told by your doctor. ? Medicated creams and medicines may be used as told.  Exercise often. Ask your doctor how much and what kind of exercise is best for you.  Go to the bathroom when you have the urge to poop. Do not wait.  Avoid pushing too hard when you poop.  Keep your butt dry and clean. Use wet toilet paper or moist towelettes after pooping.  Do not sit on the toilet for a long time.  Keep all follow-up visits as told by your doctor. This is important. Contact a doctor if you:  Have pain and swelling that do not get better with treatment or medicine.  Have trouble pooping.  Cannot poop.  Have pain or swelling outside the area of the hemorrhoids. Get help right away if you have:  Bleeding that will not stop. Summary  Hemorrhoids are swollen veins in  the butt or around the opening of the butt.  They can cause pain, itching, or bleeding.  Eat foods that have a lot of fiber in them. These include whole grains, beans, nuts, fruits, and vegetables.  Take a warm-water bath (sitz bath) for 20 minutes to ease pain. Do this 3-4 times a day. This information is not intended to replace advice given to you by your health care provider. Make sure you discuss any questions you have with your health care provider. Document Revised: 07/02/2018 Document Reviewed: 11/13/2017 Elsevier Patient Education  2021 [employer].  Diverticulosis  Diverticulosis is a condition that develops when small pouches (diverticula) form in the wall of the large intestine (colon). The colon is where water is absorbed and stool (feces) is formed. The pouches form when the inside layer of the colon pushes through weak spots in the outer layers of the colon. You may have a few pouches or many of them. The pouches usually do not cause problems unless they become inflamed or infected. When this happens, the condition is called diverticulitis. What are the  causes? The cause of this condition is not known. What increases the risk? The following factors may make you more likely to develop this condition:  Being older than age 12. Your risk for this condition increases with age. Diverticulosis is rare among people younger than age 83. By age 103, many people have it.  Eating a low-fiber diet.  Having frequent constipation.  Being overweight.  Not getting enough exercise.  Smoking.  Taking over-the-counter pain medicines, like aspirin and ibuprofen.  Having a family history of diverticulosis. What are the signs or symptoms? In most people, there are no symptoms of this condition. If you do have symptoms, they may include:  Bloating.  Cramps in the abdomen.  Constipation or diarrhea.  Pain in the lower left side of the abdomen. How is this diagnosed? Because diverticulosis usually has no symptoms, it is most often diagnosed during an exam for other colon problems. The condition may be diagnosed by:  Using a flexible scope to examine the colon (colonoscopy).  Taking an X-ray of the colon after dye has been put into the colon (barium enema).  Having a CT scan. How is this treated? You may not need treatment for this condition. Your health care provider may recommend treatment to prevent problems. You may need treatment if you have symptoms or if you previously had diverticulitis. Treatment may include:  Eating a high-fiber diet.  Taking a fiber supplement.  Taking a live bacteria supplement (probiotic).  Taking medicine to relax your colon.   Follow these instructions at home: Medicines  Take over-the-counter and prescription medicines only as told by your health care provider.  If told by your health care provider, take a fiber supplement or probiotic. Constipation prevention Your condition may cause constipation. To prevent or treat constipation, you may need to:  Drink enough fluid to keep your urine pale  yellow.  Take over-the-counter or prescription medicines.  Eat foods that are high in fiber, such as beans, whole grains, and fresh fruits and vegetables.  Limit foods that are high in fat and processed sugars, such as fried or sweet foods.   General instructions  Try not to strain when you have a bowel movement.  Keep all follow-up visits as told by your health care provider. This is important. Contact a health care provider if you:  Have pain in your abdomen.  Have bloating.  Have cramps.  Have not had a bowel movement in 3 days. Get help right away if:  Your pain gets worse.  Your bloating becomes very bad.  You have a fever or chills, and your symptoms suddenly get worse.  You vomit.  You have bowel movements that are bloody or black.  You have bleeding from your rectum. Summary  Diverticulosis is a condition that develops when small pouches (diverticula) form in the wall of the large intestine (colon).  You may have a few pouches or many of them.  This condition is most often diagnosed during an exam for other colon problems.  Treatment may include increasing the fiber in your diet, taking supplements, or taking medicines. This information is not intended to replace advice given to you by your health care provider. Make sure you discuss any questions you have with your health care provider. Document Revised: 01/21/2019 Document Reviewed: 01/21/2019 Elsevier Patient Education  [employer].  Colon Polyps  Colon polyps are tissue growths inside the colon, which is part of the large intestine. They are one of the types of polyps that can grow in the body. A polyp may be a round bump or a mushroom-shaped growth. You could have one polyp or more than one. Most colon polyps are noncancerous (benign). However, some colon polyps can become cancerous over time. Finding and removing the polyps early can help prevent this. What are the causes? The exact cause of  colon polyps is not known. What increases the risk? The following factors may make you more likely to develop this condition:  Having a family history of colorectal cancer or colon polyps.  Being older than 75 years of age.  Being younger than 75 years of age and having a significant family history of colorectal cancer or colon polyps or a genetic condition that puts you at higher risk of getting colon polyps.  Having inflammatory bowel disease, such as ulcerative colitis or Crohn's disease.  Having certain conditions passed from parent to child (hereditary conditions), such as: ? Familial adenomatous polyposis (FAP). ? Lynch syndrome. ? Turcot syndrome. ? Peutz-Jeghers syndrome. ? MUTYH-associated polyposis (MAP).  Being overweight.  Certain lifestyle factors. These include smoking cigarettes, drinking too much alcohol, not getting enough exercise, and eating a diet that is high in fat and red meat and low in fiber.  Having had childhood cancer that was treated with radiation of the abdomen. What are the signs or symptoms? Many times, there are no symptoms. If you have symptoms, they may include:  Blood coming from the rectum during a bowel movement.  Blood in the stool (feces). The blood may be bright red or very dark in color.  Pain in the abdomen.  A change in bowel habits, such as constipation or diarrhea. How is this diagnosed? This condition is diagnosed with a colonoscopy. This is a procedure in which a lighted, flexible scope is inserted into the opening between the buttocks (anus) and then passed into the colon to examine the area. Polyps are sometimes found when a colonoscopy is done as part of routine cancer screening tests. How is this treated? This condition is treated by removing any polyps that are found. Most polyps can be removed during a colonoscopy. Those polyps will then be tested for cancer. Additional treatment may be needed depending on the results of  testing. Follow these instructions at home: Eating and drinking  Eat foods that are high in fiber, such as fruits, vegetables, and whole grains.  Eat foods  that are high in calcium and vitamin D, such as milk, cheese, yogurt, eggs, liver, fish, and broccoli.  Limit foods that are high in fat, such as fried foods and desserts.  Limit the amount of red meat, precooked or cured meat, or other processed meat that you eat, such as hot dogs, sausages, bacon, or meat loaves.  Limit sugary drinks.   Lifestyle  Maintain a healthy weight, or lose weight if recommended by your health care provider.  Exercise every day or as told by your health care provider.  Do not use any products that contain nicotine or tobacco, such as cigarettes, e-cigarettes, and chewing tobacco. If you need help quitting, ask your health care provider.  Do not drink alcohol if: ? Your health care provider tells you not to drink. ? You are pregnant, may be pregnant, or are planning to become pregnant.  If you drink alcohol: ? Limit how much you use to:  0-1 drink a day for women.  0-2 drinks a day for men. ? Know how much alcohol is in your drink. In the [place] one drink equals one 12 oz bottle of beer (355 mL), one 5 oz glass of wine (148 mL), or one 1 oz glass of hard liquor (44 mL). General instructions  Take over-the-counter and prescription medicines only as told by your health care provider.  Keep all follow-up visits. This is important. This includes having regularly scheduled colonoscopies. Talk to your health care provider about when you need a colonoscopy. Contact a health care provider if:  You have new or worsening bleeding during a bowel movement.  You have new or increased blood in your stool.  You have a change in bowel habits.  You lose weight for no known reason. Summary  Colon polyps are tissue growths inside the colon, which is part of the large intestine. They are one type of polyp that  can grow in the body.  Most colon polyps are noncancerous (benign), but some can become cancerous over time.  This condition is diagnosed with a colonoscopy.  This condition is treated by removing any polyps that are found. Most polyps can be removed during a colonoscopy. This information is not intended to replace advice given to you by your health care provider. Make sure you discuss any questions you have with your health care provider. Document Revised: 10/13/2019 Document Reviewed: 10/13/2019 Elsevier Patient Education  2021 [employer].

## 2020-11-02 NOTE — Op Note (Signed)
[facility] Patient Name: [name] Procedure Date: 11/02/2020 8:07 AM MRN: [id number] Date of Birth: March 28, 1946 Attending MD: Norvel Richards , MD CSN: [id number] Age: 75 Admit Type: Outpatient Procedure:                Colonoscopy Indications:              High risk colon cancer surveillance: Personal                            history of colonic polyps Providers:                Norvel Richards, MD, [place], [place]                            Risa Grill, Technician Referring MD:              Medicines:                Propofol per Anesthesia Complications:            No immediate complications. Estimated Blood Loss:     Estimated blood loss was minimal. Procedure:                Pre-Anesthesia Assessment:                           - Prior to the procedure, a History and Physical                            was performed, and patient medications and                            allergies were reviewed. The patient's tolerance of                            previous anesthesia was also reviewed. The risks                            and benefits of the procedure and the sedation                            options and risks were discussed with the patient.                            All questions were answered, and informed consent                            was obtained. Prior Anticoagulants: The patient has                            taken no previous anticoagulant or antiplatelet                            agents. ASA Grade Assessment: III - A patient with  severe systemic disease. After reviewing the risks                            and benefits, the patient was deemed in                            satisfactory condition to undergo the procedure.                           After obtaining informed consent, the colonoscope                            was passed under direct vision. Throughout the                            procedure,  the patient's blood pressure, pulse, and                            oxygen saturations were monitored continuously. The                            [id number]-[id number] ([id number]) scope was introduced through                            the anus and advanced to the 10 cm into the ileum.                            The colonoscopy was performed without difficulty.                            The patient tolerated the procedure well. The                            quality of the bowel preparation was adequate. The                            terminal ileum, ileocecal valve, appendiceal                            orifice, and rectum were photographed. The entire                            colon was well visualized. Scope In: 8:30:47 AM Scope Out: 8:42:25 AM Scope Withdrawal Time: 0 hours 8 minutes 50 seconds  Total Procedure Duration: 0 hours 11 minutes 38 seconds  Findings:      The perianal and digital rectal examinations were normal.      Non-bleeding internal hemorrhoids were found during retroflexion. The       hemorrhoids were moderate, medium-sized and Grade I (internal       hemorrhoids that do not prolapse).      Two sessile polyps were found in the hepatic flexure. The polyps were 4       to 6 mm in size. These polyps were removed with a cold snare. Resection  and retrieval were complete. Estimated blood loss was minimal.      Multiple medium-mouthed diverticula were found in the entire colon.       Distal 10 cm's of TI appeared normal      The exam was otherwise without abnormality on direct and retroflexion       views. Impression:               - Non-bleeding internal hemorrhoids.                           - Two 4 to 6 mm polyps at the hepatic flexure,                            removed with a cold snare. Resected and retrieved.                           - Diverticulosis in the entire examined colon.                            Normal-appearing TI                           - The  examination was otherwise normal on direct                            and retroflexion views. Moderate Sedation:      Moderate (conscious) sedation was personally administered by an       anesthesia professional. The following parameters were monitored: oxygen       saturation, heart rate, blood pressure, and response to care. Recommendation:           - Patient has a contact number available for                            emergencies. The signs and symptoms of potential                            delayed complications were discussed with the                            patient. Return to normal activities tomorrow.                            Written discharge instructions were provided to the                            patient.                           - Resume previous diet.                           - Repeat colonoscopy for surveillance.                           - Return to GI office in 3 months. Procedure Code(s):        ---  Professional ---                           [phone], Colonoscopy, flexible; with removal of                            tumor(s), polyp(s), or other lesion(s) by snare                            technique Diagnosis Code(s):        --- Professional ---                           Z86.010, Personal history of colonic polyps                           K63.5, Polyp of colon                           K64.0, First degree hemorrhoids                           K57.30, Diverticulosis of large intestine without                            perforation or abscess without bleeding CPT copyright 2019 American Medical Association. All rights reserved. The codes documented in this report are preliminary and upon coder review may  be revised to meet current compliance requirements. Cristopher Estimable. Danielle Mink, MD Norvel Richards, MD 11/02/2020 8:48:53 AM This report has been signed electronically. Number of Addenda: 0

## 2020-11-02 NOTE — Anesthesia Postprocedure Evaluation (Signed)
Anesthesia Post Note  Patient: [name]  Procedure(s) Performed: COLONOSCOPY WITH PROPOFOL (N/A ) POLYPECTOMY INTESTINAL  Patient location during evaluation: Endoscopy Anesthesia Type: General Level of consciousness: awake and alert and oriented Pain management: pain level controlled Vital Signs Assessment: post-procedure vital signs reviewed and stable Respiratory status: spontaneous breathing and respiratory function stable Cardiovascular status: blood pressure returned to baseline and stable Postop Assessment: no apparent nausea or vomiting Anesthetic complications: no   No complications documented.   Last Vitals:  Vitals:   11/02/20 0802 11/02/20 0846  BP: 131/87 (!) 108/59  Pulse: 65   Resp: 16 14  Temp: 37.1 C 36.8 C  SpO2: 98% 98%    Last Pain:  Vitals:   11/02/20 0846  TempSrc: Oral  PainSc: 0-No pain                 Robena Ewy C Issiah Huffaker

## 2020-11-02 NOTE — Transfer of Care (Signed)
Immediate Anesthesia Transfer of Care Note  Patient: [name]  Procedure(s) Performed: COLONOSCOPY WITH PROPOFOL (N/A ) POLYPECTOMY INTESTINAL  Patient Location: Endoscopy Unit  Anesthesia Type:General  Level of Consciousness: awake, alert  and oriented  Airway & Oxygen Therapy: Patient Spontanous Breathing  Post-op Assessment: Report given to RN and Post -op Vital signs reviewed and stable  Post vital signs: Reviewed and stable  Last Vitals:  Vitals Value Taken Time  BP 108/59 11/02/20 0846  Temp 36.8 C 11/02/20 0846  Pulse    Resp 14 11/02/20 0846  SpO2 98 % 11/02/20 0846    Last Pain:  Vitals:   11/02/20 0846  TempSrc: Oral  PainSc: 0-No pain      Patients Stated Pain Goal: 8 (47/42/59 [id number])  Complications: No complications documented.

## 2020-11-02 NOTE — H&P (Signed)
@LOGO @   Primary Care Physician:  Tisovec, Fransico Him, MD Primary Gastroenterologist:  Dr. Gala Romney  Pre-Procedure History & Physical: HPI:  [name] is a 75 y.o. female here for surveillance colonoscopy.  History colonic adenoma previously alternating constipation and diarrhea most consistent with irritable bowel syndrome.  Past Medical History:  Diagnosis Date  . Anxiety   . Arthritis   . Bronchiolitis    acute  . Chronic back pain   . Depression   . Diverticulosis   . GERD (gastroesophageal reflux disease)    negative H pylori stool Ag  . High cholesterol   . HTN (hypertension)   . Hyperplastic colon polyp 11/08/08  . IBS (irritable bowel syndrome)   . Tubular adenoma of colon   . Umbilical hernia   . Wears glasses     Past Surgical History:  Procedure Laterality Date  . ABDOMINAL HYSTERECTOMY  1985  . APPENDECTOMY    . BIOPSY  05/09/2016   Procedure: BIOPSY;  Surgeon: Daneil Dolin, MD;  [facility]: [facility];  Service: Endoscopy;;  bx ulcer at ileocecal valve  . BREAST SURGERY  1989   both sides  . CARPAL TUNNEL RELEASE Right 08/23/2013   Procedure: RIGHT ENDOSCOPIC CARPAL  TUNNEL RELEASE;  Surgeon: Jolyn Nap, MD;  Location: [place];  Service: Orthopedics;  Laterality: Right;  . CATARACT EXTRACTION, BILATERAL    . [place] SURGERY  2007  . CHOLECYSTECTOMY  1980  . COLONOSCOPY  11/08/08   Dr. Gala Romney- hemorrhoids, pancolonic diverticula,,hyperplastic polyp  . COLONOSCOPY   08/05/2005   ALP:[id number] rectum/Diminutive polyp at 61 cm/Pancolonic diverticula/benign-appearing ulcer in the mid descending colon  . COLONOSCOPY N/A 03/30/2013   OXB:DZHGDJM anal canal hemorrhoids. Single colonic polyp-removed (tubular adenoma). Colonic diverticulosis.  . COLONOSCOPY N/A 05/09/2016   Dr. Gala Romney: diverticulosis in entire colon, mucosal ulceration at IC valve felt to be related to NSAID effect, otherwise normal. due for surveillance in 2022.   . DORSAL  COMPARTMENT RELEASE Left 12/12/2014   Procedure: LEFT WRIST RELEASE DORSAL COMPARTMENT (DEQUERVAIN);  Surgeon: Milly Jakob, MD;  Location: [place];  Service: Orthopedics;  Laterality: Left;  . ECTOPIC PREGNANCY SURGERY     bilat  . FOOT SURGERY Right 02/2017  . NEPHRECTOMY  1975   left-infection  . TRIGGER FINGER RELEASE Right 08/23/2013   Procedure: RIGHT INDEX FINGER TRIGGER RELEASE ;  Surgeon: Jolyn Nap, MD;  Location: [place];  Service: Orthopedics;  Laterality: Right;    Prior to Admission medications   Medication Sig Start Date End Date Taking? Authorizing Provider  amLODipine (NORVASC) 5 MG tablet Take 5 mg by mouth at bedtime. 09/08/20  Yes [provider]  amLODipine-valsartan (EXFORGE) 5-160 MG per tablet Take 1 tablet by mouth daily.   Yes [provider]  Biotin 1000 MCG tablet Take 1,000 mcg by mouth daily.   Yes [provider]  budesonide-formoterol (SYMBICORT) 160-4.5 MCG/ACT inhaler Inhale 2 puffs into the lungs daily as needed (asthma).   Yes [provider]  Carboxymeth-Glyc-Polysorb PF (REFRESH DIGITAL PF) 0.5-1-0.5 % SOLN Place 1 drop into both eyes 3 (three) times daily as needed (dry eyes).   Yes [provider]  Cholecalciferol (VITAMIN D) 50 MCG (2000 UT) tablet Take 2,000 Units by mouth daily.   Yes [provider]  diclofenac Sodium (VOLTAREN) 1 % GEL Apply 1 application topically 3 (three) times daily as needed (pain).   Yes [provider]  ELDERBERRY PO Take 2 each by mouth daily.   Yes [provider]  escitalopram (LEXAPRO) 10 MG tablet Take 10 mg by mouth daily.   Yes [provider]  fluvastatin (LESCOL) 20 MG capsule Take 20 mg by mouth 3 (three) times a week.   Yes [provider]  furosemide (LASIX) 20 MG tablet Take 20 mg by mouth daily. 11/13/10  Yes [provider]  HYDROcodone-acetaminophen (NORCO/VICODIN) 5-325 MG  tablet Take 1 tablet by mouth 2 (two) times daily. 04/06/16  Yes [provider]  hyoscyamine (LEVSIN SL) 0.125 MG SL tablet DISSOLVE ONE TAB UNDER TONGUE EVERY 4 HOURS AS NEEDED AND AT BEDTIME Patient taking differently: Take 0.125 mg by mouth every 4 (four) hours as needed for cramping. 12/30/19  Yes Erenest Rasher, PA-C  Menthol, Topical Analgesic, (BIOFREEZE) 4 % GEL Apply 1 application topically daily as needed (pain).   Yes [provider]  metoprolol tartrate (LOPRESSOR) 25 MG tablet Take 25 mg by mouth daily. 07/29/17  Yes [provider]  Multiple Vitamins-Minerals (MULTIVITAMIN ADULT) CHEW Chew 2 each by mouth daily.   Yes [provider]  omeprazole (PRILOSEC) 20 MG capsule Take 20 mg by mouth daily as needed. For acid reflux   Yes [provider]  OVER THE COUNTER MEDICATION Take 4-5 drops by mouth daily as needed (pain). CBD OIL as needed   Yes [provider]  polyethylene glycol-electrolytes (TRILYTE) 420 g solution Take 4,000 mLs by mouth as directed. 09/19/20  Yes Latifah Padin, Cristopher Estimable, MD  primidone (MYSOLINE) 50 MG tablet TAKE 2 TABLETS BY MOUTH IN THE AM AND 1 TABLET IN THE PM Patient taking differently: Take 50-100 mg by mouth See admin instructions. TAKE 2 TABLETS (100 mg ) BY MOUTH IN THE AM AND 1 TABLET (50 mg) IN THE PM 10/03/20  Yes Tat, Eustace Quail, DO  simethicone (MYLICON) 80 MG chewable tablet Chew 80 mg by mouth daily as needed (bloating).   Yes [provider]  fluconazole (DIFLUCAN) 150 MG tablet Take 1 now and 1 in 3 days Patient not taking: Reported on 10/24/2020 06/13/20   Estill Dooms, NP    Allergies as of 09/19/2020 - Review Complete 09/19/2020  Allergen Reaction Noted  . Morphine and related Hypertension 11/11/2013    Family History  Problem Relation Age of Onset  . Cancer Brother 60       gastric  . Heart attack Mother   . Colon cancer Neg Hx   . Colon polyps Neg Hx     Social History    Socioeconomic History  . Marital status: Widowed    Spouse name: [name]  . Number of children: 1  . Years of education: some coll.  . Highest education level: Not on file  Occupational History  . 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 Smoking status: Former Smoker    Packs/day: 0.25    Years: 30.00    Pack years: 7.50    Types: Cigarettes    Quit date: 08/19/2000    Years since quitting: 20.2  . Smokeless tobacco: Never Used  . Tobacco comment: "smoked but not inhaled"  Vaping Use  . Vaping Use: Never used  Substance and Sexual Activity  . Alcohol use: No    Alcohol/week: 0.0 standard drinks  . Drug use: No  . Sexual activity: Not Currently    Birth control/protection: Surgical    Comment:  hyst  Other Topics Concern  . Not on file  Social History Narrative   Consumes 2 cups of caffeine daily   Left handed   One story home   Social Determinants of Health   Financial Resource Strain: Not on file  Food Insecurity: Not on file  Transportation Needs: Not on file  Physical Activity: Not on file  Stress: Not on file  Social Connections: Not on file  Intimate Partner Violence: Not on file    Review of Systems: See HPI, otherwise negative ROS  Physical Exam: BP 131/87   Pulse 65   Temp 98.7 F (37.1 C) (Oral)   Resp 16   Ht 5\' 7"  (1.702 m)   Wt 76.2 kg   SpO2 98%   BMI 26.31 kg/m  General:   Alert,  Well-developed, well-nourished, pleasant and cooperative in NAD Neck:  Supple; no masses or thyromegaly. No significant cervical adenopathy. Lungs:  Clear throughout to auscultation.   No wheezes, crackles, or rhonchi. No acute distress. Heart:  Regular rate and rhythm; no murmurs, clicks, rubs,  or gallops. Abdomen: Non-distended, normal bowel sounds.  Soft and nontender without appreciable mass or hepatosplenomegaly.  Pulses:  Normal pulses noted. Extremities:  Without clubbing or edema.  Impression/Plan:  75 year old lady here for surveillance colonoscopy.  History of colonic adenoma.  Altering constipation and diarrhea.  Diarrhea when she gets stressed out.  The risks, benefits, limitations, alternatives and imponderables have been reviewed with the patient. Questions have been answered. All parties are agreeable.     Notice: This dictation was prepared with Dragon dictation along with smaller phrase technology. Any transcriptional errors that result from this process are unintentional and may not be corrected upon review.

## 2020-11-03 ENCOUNTER — Encounter: Payer: Self-pay | Admitting: Internal Medicine

## 2020-11-03 LAB — SURGICAL PATHOLOGY

## 2020-11-05 ENCOUNTER — Telehealth: Payer: Self-pay | Admitting: Internal Medicine

## 2020-11-05 NOTE — Telephone Encounter (Signed)
Pt called yesterday and today.  Mainly concerned about no BM since tcs now 3 days ago; some cramping yesterday but none now; no abd pain or bleeding.  Baseline alt constipation and diarrhea; she has been has Amitiza on hand for constipation.  Procedure note reviewed; path reviewed - conveyed to pt (letter en route). She was re-assured.  Bowel function should return ; may resume amitiza as needed. Questions answered.

## 2020-11-06 ENCOUNTER — Telehealth: Payer: Self-pay | Admitting: Internal Medicine

## 2020-11-06 ENCOUNTER — Encounter (HOSPITAL_COMMUNITY): Payer: Self-pay | Admitting: Internal Medicine

## 2020-11-06 NOTE — Telephone Encounter (Signed)
Noted. FYI Dr. Gala Romney.

## 2020-11-06 NOTE — Telephone Encounter (Signed)
PATIENT CALLED TO LET us KNOW THAT SHE FINALLY HAD A BOWEL MOVEMENT AND FEELS MUCH BETTER

## 2020-11-06 NOTE — Telephone Encounter (Signed)
Noted  

## 2020-11-07 NOTE — Telephone Encounter (Signed)
done

## 2020-11-13 ENCOUNTER — Ambulatory Visit: Payer: Medicare PPO | Admitting: Adult Health

## 2020-11-13 ENCOUNTER — Other Ambulatory Visit: Payer: Self-pay

## 2020-11-13 ENCOUNTER — Encounter: Payer: Self-pay | Admitting: Adult Health

## 2020-11-13 VITALS — BP 127/75 | HR 74 | Ht 66.0 in | Wt 165.2 lb

## 2020-11-13 DIAGNOSIS — N760 Acute vaginitis: Secondary | ICD-10-CM

## 2020-11-13 DIAGNOSIS — N898 Other specified noninflammatory disorders of vagina: Secondary | ICD-10-CM | POA: Diagnosis not present

## 2020-11-13 DIAGNOSIS — B9689 Other specified bacterial agents as the cause of diseases classified elsewhere: Secondary | ICD-10-CM | POA: Diagnosis not present

## 2020-11-13 LAB — POCT WET PREP (WET MOUNT)
Clue Cells Wet Prep Whiff POC: NEGATIVE
WBC, Wet Prep HPF POC: POSITIVE

## 2020-11-13 MED ORDER — METRONIDAZOLE 500 MG PO TABS: 500.0000 mg | ORAL_TABLET | Freq: Two times a day (BID) | ORAL | 0 refills | Status: DC

## 2020-11-13 NOTE — Progress Notes (Signed)
  Subjective:     Patient ID: [name], female   DOB: 11/14/1945, 75 y.o.   MRN: [id number]  HPI [name] is a 75 year old black female, widowed, sp hysterectomy in complaining of vaginal discharge and itching in vaginal area, with slight odor at times. PCP is Tisovec.  Review of Systems Has vaginal discharge with itching in vaginal area,has been going on since November, had pneumonia then  Slight odor at times Not sexually active Reviewed past medical,surgical, social and family history. Reviewed medications and allergies.     Objective:   Physical Exam BP 127/75 (BP Location: Right Arm, Patient Position: Sitting, Cuff Size: Normal)   Pulse 74   Ht 5\' 6"  (1.676 m)   Wt 165 lb 3.2 oz (74.9 kg)   BMI 26.66 kg/m  Skin warm and dry.Pelvic: external genitalia is normal in appearance no lesions, vagina: creamy discharge without odor,vaginal cuff is tender,urethra has small caruncle, cervix and uterus are absent,adnexa: no masses or tenderness noted. Bladder is non tender and no masses felt. Wet prep: + for clue cells and +WBCs.   [facility] - 11/13/20 1516      Pregnancy Intention Screening   Does the patient want to become pregnant in the next year? No    Does the patient's partner want to become pregnant in the next year? No    Would the patient like to discuss contraceptive options today? No      Contraception Wrap Up   Current Method Female Sterilization   Post-menopausal,hyst   End Method Female Sterilization   hyst   Contraception Counseling Provided No         Examination chaperoned by Engineer, materials    Assessment:     1. Vaginal discharge  2. BV (bacterial vaginosis) Will rx flagyl.No alcohol  Meds ordered this encounter  Medications  . metroNIDAZOLE (FLAGYL) 500 MG tablet    Sig: Take 1 tablet (500 mg total) by mouth 2 (two) times daily.    Dispense:  14 tablet    Refill:  0    Order Specific Question:   Supervising Provider    Answer:   Elonda Husky, LUTHER H [[id number]]     3. Itching in the vaginal area    Plan:     Follow up prn

## 2020-11-22 DIAGNOSIS — M25542 Pain in joints of left hand: Secondary | ICD-10-CM | POA: Diagnosis not present

## 2020-11-22 DIAGNOSIS — M546 Pain in thoracic spine: Secondary | ICD-10-CM | POA: Diagnosis not present

## 2020-11-22 DIAGNOSIS — M5441 Lumbago with sciatica, right side: Secondary | ICD-10-CM | POA: Diagnosis not present

## 2020-11-22 DIAGNOSIS — M9905 Segmental and somatic dysfunction of pelvic region: Secondary | ICD-10-CM | POA: Diagnosis not present

## 2020-11-22 DIAGNOSIS — M9903 Segmental and somatic dysfunction of lumbar region: Secondary | ICD-10-CM | POA: Diagnosis not present

## 2020-11-22 DIAGNOSIS — M9902 Segmental and somatic dysfunction of thoracic region: Secondary | ICD-10-CM | POA: Diagnosis not present

## 2020-12-14 ENCOUNTER — Telehealth: Payer: Self-pay

## 2020-12-14 DIAGNOSIS — K921 Melena: Secondary | ICD-10-CM

## 2020-12-14 NOTE — Telephone Encounter (Signed)
Pt called office to let us know she had diarrhea this morning after drinking coffee (occ has diarrhea after drinking coffee). 2nd stool was watery and black. She ate some purple lettuce in a salad 2 days ago. She is wondering if black stool is from the lettuce. Has taken any pepto bismol. No abdominal pain or n/v. Has usual pain on left side from hernia.

## 2020-12-14 NOTE — Telephone Encounter (Signed)
FYI to Roseanne Kaufman, NP who will be covering while I am off tomorrow.

## 2020-12-14 NOTE — Telephone Encounter (Signed)
Spoke with patient. Had 1 episode of black stool this morning. None since. No other significant GI symptoms. No NSAIDs. Doubt this is secondary to upper GI bleed, but will check CBC to ensure hemoglobin stability. Patient is to go to the emergency room with any persistent symptoms.

## 2020-12-15 DIAGNOSIS — K921 Melena: Secondary | ICD-10-CM | POA: Diagnosis not present

## 2020-12-15 LAB — CBC WITH DIFFERENTIAL/PLATELET
Absolute Monocytes: 413 cells/uL (ref 200–950)
Basophils Absolute: 11 cells/uL (ref 0–200)
Basophils Relative: 0.2 %
Eosinophils Absolute: 58 cells/uL (ref 15–500)
Eosinophils Relative: 1.1 %
HCT: 38.2 % (ref 35.0–45.0)
Hemoglobin: 12.3 g/dL (ref 11.7–15.5)
Lymphs Abs: 2470 cells/uL (ref 850–3900)
MCH: 29.7 pg (ref 27.0–33.0)
MCHC: 32.2 g/dL (ref 32.0–36.0)
MCV: 92.3 fL (ref 80.0–100.0)
MPV: 12.1 fL (ref 7.5–12.5)
Monocytes Relative: 7.8 %
Neutro Abs: 2348 cells/uL (ref 1500–7800)
Neutrophils Relative %: 44.3 %
Platelets: 243 10*3/uL (ref 140–400)
RBC: 4.14 10*6/uL (ref 3.80–5.10)
RDW: 11.8 % (ref 11.0–15.0)
Total Lymphocyte: 46.6 %
WBC: 5.3 10*3/uL (ref 3.8–10.8)

## 2020-12-19 DIAGNOSIS — M65332 Trigger finger, left middle finger: Secondary | ICD-10-CM | POA: Diagnosis not present

## 2020-12-22 ENCOUNTER — Telehealth: Payer: Self-pay | Admitting: Adult Health

## 2020-12-22 MED ORDER — METRONIDAZOLE 500 MG PO TABS: 500.0000 mg | ORAL_TABLET | Freq: Two times a day (BID) | ORAL | 1 refills | Status: DC

## 2020-12-22 NOTE — Telephone Encounter (Signed)
Symptoms got a little better but now has started up again Can we order more meds or try something else?   Please advise & notify pt   [facility]

## 2020-12-22 NOTE — Telephone Encounter (Signed)
Pt states her discharge started again 2-3 weeks after stopping med. Pt is requesting more med or what do you advise? Thanks! [place]

## 2020-12-22 NOTE — Telephone Encounter (Signed)
Refilled flagyl 

## 2020-12-29 ENCOUNTER — Emergency Department (HOSPITAL_COMMUNITY)
Admission: EM | Admit: 2020-12-29 | Discharge: 2020-12-30 | Disposition: A | Discharge status: Home / Self Care | Payer: Medicare PPO | Attending: Emergency Medicine | Admitting: Emergency Medicine

## 2020-12-29 ENCOUNTER — Other Ambulatory Visit: Payer: Self-pay

## 2020-12-29 ENCOUNTER — Emergency Department (HOSPITAL_COMMUNITY): Payer: Medicare PPO

## 2020-12-29 ENCOUNTER — Encounter (HOSPITAL_COMMUNITY): Payer: Self-pay | Admitting: *Deleted

## 2020-12-29 DIAGNOSIS — Z87891 Personal history of nicotine dependence: Secondary | ICD-10-CM | POA: Insufficient documentation

## 2020-12-29 DIAGNOSIS — R42 Dizziness and giddiness: Secondary | ICD-10-CM | POA: Diagnosis not present

## 2020-12-29 DIAGNOSIS — R519 Headache, unspecified: Secondary | ICD-10-CM | POA: Insufficient documentation

## 2020-12-29 DIAGNOSIS — Z20822 Contact with and (suspected) exposure to covid-19: Secondary | ICD-10-CM | POA: Insufficient documentation

## 2020-12-29 DIAGNOSIS — Z79899 Other long term (current) drug therapy: Secondary | ICD-10-CM | POA: Insufficient documentation

## 2020-12-29 DIAGNOSIS — R55 Syncope and collapse: Secondary | ICD-10-CM | POA: Diagnosis not present

## 2020-12-29 DIAGNOSIS — I10 Essential (primary) hypertension: Secondary | ICD-10-CM | POA: Diagnosis not present

## 2020-12-29 DIAGNOSIS — K529 Noninfective gastroenteritis and colitis, unspecified: Secondary | ICD-10-CM | POA: Insufficient documentation

## 2020-12-29 DIAGNOSIS — R059 Cough, unspecified: Secondary | ICD-10-CM | POA: Insufficient documentation

## 2020-12-29 DIAGNOSIS — G319 Degenerative disease of nervous system, unspecified: Secondary | ICD-10-CM | POA: Diagnosis not present

## 2020-12-29 DIAGNOSIS — I6523 Occlusion and stenosis of bilateral carotid arteries: Secondary | ICD-10-CM | POA: Diagnosis not present

## 2020-12-29 DIAGNOSIS — I771 Stricture of artery: Secondary | ICD-10-CM | POA: Diagnosis not present

## 2020-12-29 DIAGNOSIS — I6782 Cerebral ischemia: Secondary | ICD-10-CM | POA: Diagnosis not present

## 2020-12-29 LAB — URINALYSIS, ROUTINE W REFLEX MICROSCOPIC
Bilirubin Urine: NEGATIVE
Glucose, UA: NEGATIVE mg/dL
Hgb urine dipstick: NEGATIVE
Ketones, ur: NEGATIVE mg/dL
Nitrite: NEGATIVE
Protein, ur: NEGATIVE mg/dL
Specific Gravity, Urine: 1.005 (ref 1.005–1.030)
pH: 7 (ref 5.0–8.0)

## 2020-12-29 LAB — CBC WITH DIFFERENTIAL/PLATELET
Abs Immature Granulocytes: 0.01 10*3/uL (ref 0.00–0.07)
Basophils Absolute: 0 10*3/uL (ref 0.0–0.1)
Basophils Relative: 0 %
Eosinophils Absolute: 0.1 10*3/uL (ref 0.0–0.5)
Eosinophils Relative: 1 %
HCT: 40.5 % (ref 36.0–46.0)
Hemoglobin: 12.8 g/dL (ref 12.0–15.0)
Immature Granulocytes: 0 %
Lymphocytes Relative: 44 %
Lymphs Abs: 2.9 10*3/uL (ref 0.7–4.0)
MCH: 29.8 pg (ref 26.0–34.0)
MCHC: 31.6 g/dL (ref 30.0–36.0)
MCV: 94.2 fL (ref 80.0–100.0)
Monocytes Absolute: 0.5 10*3/uL (ref 0.1–1.0)
Monocytes Relative: 7 %
Neutro Abs: 3.1 10*3/uL (ref 1.7–7.7)
Neutrophils Relative %: 48 %
Platelets: 221 10*3/uL (ref 150–400)
RBC: 4.3 MIL/uL (ref 3.87–5.11)
RDW: 12.6 % (ref 11.5–15.5)
WBC: 6.6 10*3/uL (ref 4.0–10.5)
nRBC: 0 % (ref 0.0–0.2)

## 2020-12-29 LAB — BASIC METABOLIC PANEL
Anion gap: 9 (ref 5–15)
BUN: 14 mg/dL (ref 8–23)
CO2: 27 mmol/L (ref 22–32)
Calcium: 9.4 mg/dL (ref 8.9–10.3)
Chloride: 102 mmol/L (ref 98–111)
Creatinine, Ser: 0.96 mg/dL (ref 0.44–1.00)
GFR, Estimated: >60 mL/min (ref >60)
Glucose, Bld: 97 mg/dL (ref 70–99)
Potassium: 4 mmol/L (ref 3.5–5.1)
Sodium: 138 mmol/L (ref 135–145)

## 2020-12-29 LAB — TROPONIN I (HIGH SENSITIVITY)
Troponin I (High Sensitivity): 3 ng/L (ref <18)
Troponin I (High Sensitivity): 3 ng/L (ref <18)

## 2020-12-29 LAB — RESP PANEL BY RT-PCR (FLU A&B, COVID) ARPGX2
Influenza A by PCR: NEGATIVE
Influenza B by PCR: NEGATIVE
SARS Coronavirus 2 by RT PCR: NEGATIVE

## 2020-12-29 MED ORDER — SODIUM CHLORIDE 0.9 % IV BOLUS
500.0000 mL | Freq: Once | INTRAVENOUS | Status: AC
  Administered 2020-12-29: 500 mL via INTRAVENOUS

## 2020-12-29 MED ORDER — KETOROLAC TROMETHAMINE 15 MG/ML IJ SOLN
15.0000 mg | Freq: Once | INTRAMUSCULAR | Status: AC
  Administered 2020-12-29: 15 mg via INTRAVENOUS
  Filled 2020-12-29: qty 1

## 2020-12-29 MED ORDER — METOCLOPRAMIDE HCL 5 MG/ML IJ SOLN
10.0000 mg | Freq: Once | INTRAMUSCULAR | Status: AC
  Administered 2020-12-29: 10 mg via INTRAVENOUS
  Filled 2020-12-29: qty 2

## 2020-12-29 MED ORDER — IOHEXOL 350 MG/ML SOLN
100.0000 mL | Freq: Once | INTRAVENOUS | Status: AC | PRN
  Administered 2020-12-29: 100 mL via INTRAVENOUS

## 2020-12-29 MED ORDER — DIPHENHYDRAMINE HCL 50 MG/ML IJ SOLN
12.5000 mg | Freq: Once | INTRAMUSCULAR | Status: AC
  Administered 2020-12-29: 12.5 mg via INTRAVENOUS
  Filled 2020-12-29: qty 1

## 2020-12-29 NOTE — ED Notes (Signed)
EKG done and seen by Dr Langston Masker. RN Ailene Rud made aware that patient is on full cardiac monitoring

## 2020-12-29 NOTE — ED Provider Notes (Signed)
[facility] EMERGENCY DEPARTMENT Provider Note   CSN: [id number] Arrival date & time: 12/29/20  1815     History Chief Complaint  Patient presents with   Dizziness    X 3 days    [name] is a 75 y.o. female presenting to emergency department with headache and vertigo.  She reports onset of symptoms approximately 3 days ago.  She describes a posterior and frontal headache associated with room spinning.  The symptoms were coming and going over the past 3 days.  Today she was at the grocery store and again began feeling that the room was spinning, and that she was lightheaded, began having a headache.  The spinning sensation persists but is milder now, she continues to have a headache.  She says the headaches began approximately 2 weeks ago, prior to that she does not suffer from migraines or headaches.  She also describes some "twinging in the back of my neck" associated with her headaches.  She does have hypertension, high cholesterol.  She is not a smoker.  She is not diabetic.  No prior history of stroke or CVA.  She does live by herself.  She has had 3 doses of the COVID-vaccine.  She reports she had a dry cough this week.  She also has chronic diarrhea.  HPI     Past Medical History:  Diagnosis Date   Anxiety    Arthritis    Bronchiolitis    acute   Chronic back pain    Depression    Diverticulosis    GERD (gastroesophageal reflux disease)    negative H pylori stool Ag   High cholesterol    HTN (hypertension)    Hyperplastic colon polyp 11/08/08   IBS (irritable bowel syndrome)    Tubular adenoma of colon    Umbilical hernia    Wears glasses     Patient Active Problem List   Diagnosis Date Noted   Itching in the vaginal area 11/13/2020   Urinary tract infection with hematuria 05/19/2019   Hematuria 05/19/2019   Urinary frequency 05/19/2019   Numbness and tingling of foot 02/23/2019   BV (bacterial vaginosis) 12/04/2018   Vaginal discharge 12/09/2017    Vaginal atrophy 12/09/2017   Heme positive stool 05/01/2016   Abdominal pain 04/16/2016   Constipation 05/26/2013   Abdominal pain, other specified site 03/16/2013   Mixed hyperlipidemia 12/19/2010   Tubular adenoma of colon 11/14/2010   ABDOMINAL PAIN, LEFT LOWER QUADRANT 03/15/2010   EDEMA 08/25/2009   Musculoskeletal chest pain 08/25/2009   GERD 10/28/2008   HYPERTENSION 10/27/2008   Irritable bowel syndrome 10/27/2008   BACK PAIN, CHRONIC 10/27/2008   DIARRHEA 10/27/2008   ABDOMINAL CRAMPS 10/27/2008   COLONIC POLYPS, HX OF 10/27/2008    Past Surgical History:  Procedure Laterality Date   ABDOMINAL HYSTERECTOMY  1985   APPENDECTOMY     BIOPSY  05/09/2016   Procedure: BIOPSY;  Surgeon: Daneil Dolin, MD;  [facility];  Service: Endoscopy;;  bx ulcer at ileocecal valve   BREAST SURGERY  1989   both sides   CARPAL TUNNEL RELEASE Right 08/23/2013   Procedure: RIGHT ENDOSCOPIC CARPAL  TUNNEL RELEASE;  Surgeon: Jolyn Nap, MD;  Location: [place];  Service: Orthopedics;  Laterality: Right;   CATARACT EXTRACTION, BILATERAL     CERVICAL [place] SURGERY  2007   CHOLECYSTECTOMY  1980   COLONOSCOPY  11/08/08   Dr. Gala Romney- hemorrhoids, pancolonic diverticula,,hyperplastic polyp   COLONOSCOPY  08/05/2005   [id number] rectum/Diminutive polyp at 75 cm/Pancolonic diverticula/benign-appearing ulcer in the mid descending colon   COLONOSCOPY N/A 03/30/2013   [id number] anal canal hemorrhoids. Single colonic polyp-removed (tubular adenoma). Colonic diverticulosis.   COLONOSCOPY N/A 05/09/2016   Dr. Gala Romney: diverticulosis in entire colon, mucosal ulceration at IC valve felt to be related to NSAID effect, otherwise normal. due for surveillance in 2022.    COLONOSCOPY WITH PROPOFOL N/A 11/02/2020   Procedure: COLONOSCOPY WITH PROPOFOL;  Surgeon: Daneil Dolin, MD;  [facility];  Service: Endoscopy;  Laterality: N/A;  AM   DORSAL COMPARTMENT RELEASE Left  12/12/2014   Procedure: LEFT WRIST RELEASE DORSAL COMPARTMENT (DEQUERVAIN);  Surgeon: Milly Jakob, MD;  Location: [place];  Service: Orthopedics;  Laterality: Left;   ECTOPIC PREGNANCY SURGERY     bilat   FOOT SURGERY Right 02/2017   NEPHRECTOMY  1975   left-infection   POLYPECTOMY  11/02/2020   Procedure: POLYPECTOMY INTESTINAL;  Surgeon: Daneil Dolin, MD;  [facility];  Service: Endoscopy;;   TRIGGER FINGER RELEASE Right 08/23/2013   Procedure: RIGHT INDEX FINGER TRIGGER RELEASE ;  Surgeon: Jolyn Nap, MD;  Location: [place];  Service: Orthopedics;  Laterality: Right;     OB History     Gravida  4   Para  1   Term  1   Preterm      AB  3   Living  1      SAB      IAB      Ectopic      Multiple      Live Births              Family History  Problem Relation Age of Onset   Cancer Brother 11       gastric   Heart attack Mother    Colon cancer Neg Hx    Colon polyps Neg Hx     Social History   Tobacco Use   Smoking status: Former    Packs/day: 0.25    Years: 30.00    Pack years: 7.50    Types: Cigarettes    Quit date: 08/19/2000    Years since quitting: 20.3   Smokeless tobacco: Never   Tobacco comments:    "smoked but not inhaled"  Vaping Use   Vaping Use: Never used  Substance Use Topics   Alcohol use: No    Alcohol/week: 0.0 standard drinks   Drug use: No    Home Medications Prior to Admission medications   Medication Sig Start Date End Date Taking? Authorizing Provider  meclizine (ANTIVERT) 25 MG tablet Take 1 tablet (25 mg total) by mouth 3 (three) times daily as needed for up to 30 doses for dizziness. 12/30/20  Yes Dorian Renfro, Carola Rhine, MD  albuterol (VENTOLIN HFA) 108 (90 Base) MCG/ACT inhaler INHALE 1 TO 2 PUFFS BY MOUTH FOUR TIMES DAILY AS NEEDED AS DIRECTED FOR DIFFICULT BREATHING OR WHEEZING 05/29/20   [provider]  amLODipine (NORVASC) 5 MG tablet Take 5 mg by mouth  at bedtime. 09/08/20   [provider]  amLODipine-valsartan (EXFORGE) 5-160 MG per tablet Take 1 tablet by mouth daily.    [provider]  Biotin 1000 MCG tablet Take 1,000 mcg by mouth daily.    [provider]  budesonide-formoterol (SYMBICORT) 160-4.5 MCG/ACT inhaler Inhale 2 puffs into the lungs daily as needed (asthma).    [provider]  Carboxymeth-Glyc-Polysorb  PF (REFRESH DIGITAL PF) 0.5-1-0.5 % SOLN Place 1 drop into both eyes 3 (three) times daily as needed (dry eyes).    [provider]  Cholecalciferol (VITAMIN D) 50 MCG (2000 UT) tablet Take 2,000 Units by mouth daily.    [provider]  diclofenac Sodium (VOLTAREN) 1 % GEL Apply 1 application topically 3 (three) times daily as needed (pain).    [provider]  ELDERBERRY PO Take 2 each by mouth daily.    [provider]  escitalopram (LEXAPRO) 10 MG tablet Take 10 mg by mouth daily.    [provider]  fluvastatin (LESCOL) 20 MG capsule Take 20 mg by mouth 3 (three) times a week.    [provider]  furosemide (LASIX) 20 MG tablet Take 20 mg by mouth daily. 11/13/10   [provider]  HYDROcodone-acetaminophen (NORCO/VICODIN) 5-325 MG tablet Take 1 tablet by mouth 2 (two) times daily. 04/06/16   [provider]  hyoscyamine (LEVSIN SL) 0.125 MG SL tablet DISSOLVE ONE TAB UNDER TONGUE EVERY 4 HOURS AS NEEDED AND AT BEDTIME Patient taking differently: Take 0.125 mg by mouth every 4 (four) hours as needed for cramping. 12/30/19   Erenest Rasher, PA-C  Menthol, Topical Analgesic, (BIOFREEZE) 4 % GEL Apply 1 application topically daily as needed (pain).    [provider]  metoprolol tartrate (LOPRESSOR) 25 MG tablet Take 25 mg by mouth daily. 07/29/17   [provider]  metroNIDAZOLE (FLAGYL) 500 MG tablet Take 1 tablet (500 mg total) by mouth 2 (two) times daily. 12/22/20   Estill Dooms, NP  Multiple  Vitamins-Minerals (MULTIVITAMIN ADULT) CHEW Chew 2 each by mouth daily.    [provider]  omeprazole (PRILOSEC) 20 MG capsule Take 20 mg by mouth daily as needed. For acid reflux    [provider]  OVER THE COUNTER MEDICATION Take 4-5 drops by mouth daily as needed (pain). CBD OIL as needed    [provider]  polyethylene glycol-electrolytes (TRILYTE) 420 g solution Take 4,000 mLs by mouth as directed. 09/19/20   Rourk, Cristopher Estimable, MD  primidone (MYSOLINE) 50 MG tablet TAKE 2 TABLETS BY MOUTH IN THE AM AND 1 TABLET IN THE PM Patient taking differently: Take 50-100 mg by mouth See admin instructions. TAKE 2 TABLETS (100 mg ) BY MOUTH IN THE AM AND 1 TABLET (50 mg) IN THE PM 10/03/20   Tat, Eustace Quail, DO  simethicone (MYLICON) 80 MG chewable tablet Chew 80 mg by mouth daily as needed (bloating).    [provider]    Allergies    Morphine and related  Review of Systems   Review of Systems  Constitutional:  Negative for chills and fever.  HENT:  Negative for ear pain and sore throat.   Eyes:  Negative for pain and visual disturbance.  Respiratory:  Positive for cough. Negative for shortness of breath.   Cardiovascular:  Negative for chest pain and palpitations.  Gastrointestinal:  Positive for diarrhea. Negative for abdominal pain, nausea and vomiting.  Genitourinary:  Negative for dysuria and hematuria.  Musculoskeletal:  Negative for arthralgias and back pain.  Skin:  Negative for color change and rash.  Neurological:  Positive for dizziness, light-headedness and headaches. Negative for syncope, facial asymmetry, weakness and numbness.  All other systems reviewed and are negative.  Physical Exam Updated Vital Signs BP (!) 140/97   Pulse 64   Temp 98.7 F (37.1 C) (Oral)   Resp (!) 22  Ht 5\' 7"  (1.702 m)   Wt 73.9 kg   SpO2 96%   BMI 25.53 kg/m   Physical Exam Constitutional:      General: She is not in acute distress. HENT:     Head:  Normocephalic and atraumatic.  Eyes:     Conjunctiva/sclera: Conjunctivae normal.     Pupils: Pupils are equal, round, and reactive to light.  Cardiovascular:     Rate and Rhythm: Normal rate and regular rhythm.     Pulses: Normal pulses.  Pulmonary:     Effort: Pulmonary effort is normal. No respiratory distress.  Abdominal:     General: There is no distension.     Tenderness: There is no abdominal tenderness.  Skin:    General: Skin is warm and dry.  Neurological:     General: No focal deficit present.     Mental Status: She is alert and oriented to person, place, and time. Mental status is at baseline.     Cranial Nerves: No cranial nerve deficit.     Sensory: No sensory deficit.     Motor: No weakness.     Comments: Patient demonstrating active vertigo symptoms and nystagmus Head impulse shows corrective saccades Unilateral horizontal nystagmus present with rightward gaze Test of skew shows no skew    Psychiatric:        Mood and Affect: Mood normal.        Behavior: Behavior normal.    ED Results / Procedures / Treatments   Labs (all labs ordered are listed, but only abnormal results are displayed) Labs Reviewed  URINALYSIS, ROUTINE W REFLEX MICROSCOPIC - Abnormal; Notable for the following components:      Result Value   Color, Urine STRAW (*)    Leukocytes,Ua SMALL (*)    Bacteria, UA RARE (*)    All other components within normal limits  RESP PANEL BY RT-PCR (FLU A&B, COVID) [id number]  BASIC METABOLIC PANEL  CBC WITH DIFFERENTIAL/PLATELET  TROPONIN I (HIGH SENSITIVITY)  TROPONIN I (HIGH SENSITIVITY)    EKG EKG Interpretation  Date/Time:  Friday December 29 2020 20:42:10 EDT Ventricular Rate:  59 PR Interval:  149 QRS Duration: 108 QT Interval:  423 QTC Calculation: 419 R Axis:   63 Text Interpretation: Sinus rhythm Probable left atrial enlargement RSR' in V1 or V2, probably normal variant Left ventricular hypertrophy Baseline wander in lead(s) V6 No STEMI  Confirmed by Octaviano Glow [phone]) on 12/29/2020 9:11:30 PM  Radiology CT Angio Head W or Wo Contrast  Result Date: 12/30/2020 CLINICAL DATA:  Initial evaluation for acute vertigo. EXAM: CT ANGIOGRAPHY HEAD AND NECK TECHNIQUE: Multidetector CT imaging of the head and neck was performed using the standard protocol during bolus administration of intravenous contrast. Multiplanar CT image reconstructions and MIPs were obtained to evaluate the vascular anatomy. Carotid stenosis measurements (when applicable) are obtained utilizing NASCET criteria, using the distal internal carotid diameter as the denominator. CONTRAST:  169mL OMNIPAQUE IOHEXOL 350 MG/ML SOLN COMPARISON:  Prior study from 02/08/2020. FINDINGS: CT HEAD FINDINGS Brain: Generalized age-related cerebral atrophy with mild chronic small vessel ischemic disease. No acute intracranial hemorrhage. No acute large vessel territory infarct. No mass lesion, midline shift or mass effect. No hydrocephalus or extra-axial fluid collection. Vascular: No hyperdense vessel. Scattered vascular calcifications noted within the carotid siphons. Skull: No scalp soft tissue abnormality.  Calvarium intact. Sinuses: Visualized paranasal sinuses are clear. No mastoid effusion. Orbits: Globes and orbital soft tissues demonstrate no acute finding. Review of the MIP  images confirms the above findings CTA NECK FINDINGS Aortic arch: Visualized aortic arch normal in caliber with normal 3 vessel morphology. Mild atheromatous change about the arch itself. No hemodynamically significant stenosis seen about the origin of the great vessels. Right carotid system: Right CCA tortuous but is widely patent to the bifurcation without stenosis. Scattered atheromatous change about the right carotid bulb/proximal right ICA without significant stenosis. Right ICA widely patent distally without stenosis, dissection or occlusion. Left carotid system: Left CCA tortuous but widely patent to the  bifurcation without stenosis. Scattered atheromatous change about the left carotid bulb/proximal left ICA without significant stenosis. Left ICA patent distally without stenosis, dissection or occlusion. Vertebral arteries: Both vertebral arteries arise from subclavian arteries. No proximal subclavian artery stenosis. Vertebral arteries mildly tortuous but are widely patent without stenosis, dissection or occlusion. Skeleton: No visible acute osseous finding. No discrete or worrisome osseous lesions. Reversal of the normal cervical lordosis with prior ACDF at C5-6. Solid arthrodesis. Other neck: No other acute soft tissue abnormality within the neck. No mass or adenopathy. Upper chest: Visualized upper chest demonstrates no acute finding. Review of the MIP images confirms the above findings CTA HEAD FINDINGS Anterior circulation: Petrous segments patent bilaterally. Scattered atheromatous change within the carotid siphons without hemodynamically significant stenosis. A1 segments patent bilaterally. Normal anterior communicating artery complex. Anterior cerebral arteries patent to their distal aspects without stenosis. No M1 stenosis or occlusion. Normal MCA bifurcations. Distal MCA branches well perfused and symmetric. Posterior circulation: Both V4 segments patent to the vertebrobasilar junction without stenosis. Both PICA origins patent and normal. Basilar diminutive but widely patent to its distal aspect. Superior cerebral arteries patent bilaterally. Left PCA supplied via the basilar as well as a prominent left posterior communicating artery. Fetal type origin of the right PCA. Both PCAs well perfused to their distal aspects without stenosis. Venous sinuses: Grossly patent allowing for timing the contrast bolus. Anatomic variants: Fetal type origin right PCA.  No aneurysm. Review of the MIP images confirms the above findings IMPRESSION: CT HEAD IMPRESSION: 1. No acute intracranial abnormality. 2. Mild  age-related cerebral atrophy with chronic small vessel ischemic disease. CTA HEAD AND NECK IMPRESSION: 1. Negative CTA for large vessel occlusion. 2. Mild atheromatous change about the carotid bifurcations and carotid siphons without hemodynamically significant or correctable stenosis. 3. Tortuosity of the major arterial vasculature of the neck, suggesting a degree of underlying chronic hypertension. Electronically Signed   By: Jeannine Boga M.D.   On: 12/30/2020 01:05   CT Angio Neck W and/or Wo Contrast  Result Date: 12/30/2020 CLINICAL DATA:  Initial evaluation for acute vertigo. EXAM: CT ANGIOGRAPHY HEAD AND NECK TECHNIQUE: Multidetector CT imaging of the head and neck was performed using the standard protocol during bolus administration of intravenous contrast. Multiplanar CT image reconstructions and MIPs were obtained to evaluate the vascular anatomy. Carotid stenosis measurements (when applicable) are obtained utilizing NASCET criteria, using the distal internal carotid diameter as the denominator. CONTRAST:  164mL OMNIPAQUE IOHEXOL 350 MG/ML SOLN COMPARISON:  Prior study from 02/08/2020. FINDINGS: CT HEAD FINDINGS Brain: Generalized age-related cerebral atrophy with mild chronic small vessel ischemic disease. No acute intracranial hemorrhage. No acute large vessel territory infarct. No mass lesion, midline shift or mass effect. No hydrocephalus or extra-axial fluid collection. Vascular: No hyperdense vessel. Scattered vascular calcifications noted within the carotid siphons. Skull: No scalp soft tissue abnormality.  Calvarium intact. Sinuses: Visualized paranasal sinuses are clear. No mastoid effusion. Orbits: Globes and orbital soft tissues demonstrate no  acute finding. Review of the MIP images confirms the above findings CTA NECK FINDINGS Aortic arch: Visualized aortic arch normal in caliber with normal 3 vessel morphology. Mild atheromatous change about the arch itself. No hemodynamically  significant stenosis seen about the origin of the great vessels. Right carotid system: Right CCA tortuous but is widely patent to the bifurcation without stenosis. Scattered atheromatous change about the right carotid bulb/proximal right ICA without significant stenosis. Right ICA widely patent distally without stenosis, dissection or occlusion. Left carotid system: Left CCA tortuous but widely patent to the bifurcation without stenosis. Scattered atheromatous change about the left carotid bulb/proximal left ICA without significant stenosis. Left ICA patent distally without stenosis, dissection or occlusion. Vertebral arteries: Both vertebral arteries arise from subclavian arteries. No proximal subclavian artery stenosis. Vertebral arteries mildly tortuous but are widely patent without stenosis, dissection or occlusion. Skeleton: No visible acute osseous finding. No discrete or worrisome osseous lesions. Reversal of the normal cervical lordosis with prior ACDF at C5-6. Solid arthrodesis. Other neck: No other acute soft tissue abnormality within the neck. No mass or adenopathy. Upper chest: Visualized upper chest demonstrates no acute finding. Review of the MIP images confirms the above findings CTA HEAD FINDINGS Anterior circulation: Petrous segments patent bilaterally. Scattered atheromatous change within the carotid siphons without hemodynamically significant stenosis. A1 segments patent bilaterally. Normal anterior communicating artery complex. Anterior cerebral arteries patent to their distal aspects without stenosis. No M1 stenosis or occlusion. Normal MCA bifurcations. Distal MCA branches well perfused and symmetric. Posterior circulation: Both V4 segments patent to the vertebrobasilar junction without stenosis. Both PICA origins patent and normal. Basilar diminutive but widely patent to its distal aspect. Superior cerebral arteries patent bilaterally. Left PCA supplied via the basilar as well as a prominent  left posterior communicating artery. Fetal type origin of the right PCA. Both PCAs well perfused to their distal aspects without stenosis. Venous sinuses: Grossly patent allowing for timing the contrast bolus. Anatomic variants: Fetal type origin right PCA.  No aneurysm. Review of the MIP images confirms the above findings IMPRESSION: CT HEAD IMPRESSION: 1. No acute intracranial abnormality. 2. Mild age-related cerebral atrophy with chronic small vessel ischemic disease. CTA HEAD AND NECK IMPRESSION: 1. Negative CTA for large vessel occlusion. 2. Mild atheromatous change about the carotid bifurcations and carotid siphons without hemodynamically significant or correctable stenosis. 3. Tortuosity of the major arterial vasculature of the neck, suggesting a degree of underlying chronic hypertension. Electronically Signed   By: Jeannine Boga M.D.   On: 12/30/2020 01:05   DG Chest Portable 1 View  Result Date: 12/29/2020 CLINICAL DATA:  Vertigo near syncope.  Dizziness and headache. EXAM: PORTABLE CHEST 1 VIEW COMPARISON:  Radiograph 08/24/2019 FINDINGS: The cardiomediastinal contours are normal. Stable subsegmental scarring in the left lower lung zone. Pulmonary vasculature is normal. No consolidation, pleural effusion, or pneumothorax. Stable dextroscoliotic curvature of the thoracic spine. No acute osseous abnormalities are seen. IMPRESSION: No acute chest findings. Stable subsegmental scarring in the left lower lung zone. Electronically Signed   By: Keith Rake M.D.   On: 12/29/2020 21:36    Procedures Procedures   Medications Ordered in ED Medications  sodium chloride 0.9 % bolus 500 mL (0 mLs Intravenous Stopped 12/29/20 2245)  metoCLOPramide (REGLAN) injection 10 mg (10 mg Intravenous Given 12/29/20 2147)  ketorolac (TORADOL) 15 MG/ML injection 15 mg (15 mg Intravenous Given 12/29/20 2146)  diphenhydrAMINE (BENADRYL) injection 12.5 mg (12.5 mg Intravenous Given 12/29/20 2147)  iohexol  (OMNIPAQUE) 350 MG/ML  injection 100 mL (100 mLs Intravenous Contrast Given 12/29/20 2207)  meclizine (ANTIVERT) tablet 25 mg (25 mg Oral Given 12/30/20 0107)  prochlorperazine (COMPAZINE) injection 10 mg (10 mg Intravenous Given 12/30/20 0106)  magnesium sulfate IVPB 1 g 100 mL (0 g Intravenous Stopped 12/30/20 0140)  amLODipine (NORVASC) tablet 5 mg (5 mg Oral Given 12/30/20 0107)    ED Course  I have reviewed the triage vital signs and the nursing notes.  Pertinent labs & imaging results that were available during my care of the patient were reviewed by me and considered in my medical decision making (see chart for details).  This patient complains of headache, vertigo.  This involves an extensive number of treatment options, and is a complaint that carries with it a high risk of complications and morbidity.  The differential diagnosis includes complex migraine vs CVA vs vertebral artery injury vs viral infection vs peripheral vertigo vs atypical ACS vs other  HINTS exam as well as the episodic nature of her symptoms are suggestive of peripheral vertigo.  However, with her posterior neck pain and headache, I felt a CTA was prudent to evaluate for vascular injury or vertebral dissection.  I had a much lower suspicion clinically for meningitis with no fever, no photophobia, no meningismus, no leukocytosis, and no confusion.  I ordered, reviewed, and interpreted labs.  No life-threatening abnormalities were noted on these tests.  Covid negative, UA without sign of infection.  BMP and cbc unremarkabable.  Troponin flat. I ordered medication for migraine I ordered imaging studies which included dg chest, CTA head and neck I independently visualized and interpreted imaging of the chest which showed no acute abnormalities.  CTA imaging was pending at the time of signout ECG reviewed personally with NSR, no acute ischemic findings  Clinical Course as of 12/30/20 1045  Sat Dec 30, 2020  0023 Patient  reports improvement of her vertigo but continues to have a headache now localized behind her right eye.  We are awaiting CTA report.  At this point explained her it is possible that she is having peripheral vertigo, and likely even, but cannot completely exclude a peripheral stroke.  If the CT is positive she will need to be admitted.  If it is negative however she prefers to go home this evening and return if her symptoms worsen.  She ready follows with neurology.  I think this is a reasonable plan.  We carefully discussed return precautions. [MT]    Clinical Course User Index [MT] Eural Holzschuh, Carola Rhine, MD   Patient signed out to Dr Christy Gentles at 12 am pending CTA read, reassessment of headache and vertigo.  I anticipate discharge home if CTA negative, after I have discussed with the patient the small chance of missed stroke, and return precautions.  Final Clinical Impression(s) / ED Diagnoses Final diagnoses:  Vertigo    Rx / DC Orders ED Discharge Orders          Ordered    meclizine (ANTIVERT) 25 MG tablet  3 times daily PRN        12/30/20 0025             Wyvonnia Dusky, MD 12/30/20 1046

## 2020-12-29 NOTE — ED Notes (Signed)
Patient is resting comfortably. 

## 2020-12-30 MED ORDER — AMLODIPINE BESYLATE 5 MG PO TABS
5.0000 mg | ORAL_TABLET | Freq: Once | ORAL | Status: AC
  Administered 2020-12-30: 5 mg via ORAL
  Filled 2020-12-30: qty 1

## 2020-12-30 MED ORDER — METOPROLOL TARTRATE 25 MG PO TABS
25.0000 mg | ORAL_TABLET | Freq: Once | ORAL | Status: DC
  Filled 2020-12-30: qty 1

## 2020-12-30 MED ORDER — MAGNESIUM SULFATE IN D5W 1-5 GM/100ML-% IV SOLN
1.0000 g | Freq: Once | INTRAVENOUS | Status: AC
  Administered 2020-12-30: 1 g via INTRAVENOUS
  Filled 2020-12-30: qty 100

## 2020-12-30 MED ORDER — MECLIZINE HCL 25 MG PO TABS: 25.0000 mg | ORAL_TABLET | Freq: Three times a day (TID) | ORAL | 0 refills | Status: DC | PRN

## 2020-12-30 MED ORDER — MECLIZINE HCL 12.5 MG PO TABS
25.0000 mg | ORAL_TABLET | Freq: Once | ORAL | Status: AC
  Administered 2020-12-30: 25 mg via ORAL
  Filled 2020-12-30: qty 2

## 2020-12-30 MED ORDER — PROCHLORPERAZINE EDISYLATE 10 MG/2ML IJ SOLN
10.0000 mg | Freq: Once | INTRAMUSCULAR | Status: AC
  Administered 2020-12-30: 10 mg via INTRAVENOUS
  Filled 2020-12-30: qty 2

## 2020-12-30 NOTE — ED Notes (Signed)
Pt HR in the 60s, EDP made aware. Metoprolol to be D/C'd.

## 2020-12-30 NOTE — ED Provider Notes (Signed)
I assumed care at signout.  Plan was to follow-up with CT imaging and reassess.  Patient had already declined admission or MRI imaging.  CT angio of head and neck without any reversible or acute occlusion Patient is now feeling improved.  I witnessed her ambulating without ataxia. She would like to be discharged home.  She is was advised that there is still a small chance of stroke that would be missed on a CAT scan.  Patient understands this I reviewed labs and EKG.  Patient will be discharged home   Ripley Fraise, MD 12/30/20 [phone]

## 2020-12-30 NOTE — ED Notes (Signed)
Pt ambulated unit independently, went to bathroom and returned to room. Pt sitting upright in chair. EDP witnessed ambulation.

## 2020-12-30 NOTE — Discharge Instructions (Addendum)
Please follow-up with your neurologist by calling their office to schedule an appointment as soon as possible.  You are having attacks of vertigo.  These are usually caused by condition inside the ear, and I prescribed medications for this.  However we did talk about the rare possibility that this is related to a stroke in the back of the brain.    You decided you did not want to stay any longer in the emergency department to get an MRI scan of your brain.  Felt it was reasonable to discharge you home, as long as you understand you need to come back to the ER if your symptoms worsen, or if you have new symptoms such as severe headache, facial droop, difficulty speaking, numbness or weakness, or any other signs of a stroke.  You should also follow-up with your neurologist as it is possible you are having complicated migraines.

## 2021-01-01 ENCOUNTER — Encounter (HOSPITAL_COMMUNITY): Payer: Self-pay | Admitting: *Deleted

## 2021-01-01 ENCOUNTER — Other Ambulatory Visit: Payer: Self-pay | Admitting: Gastroenterology

## 2021-01-01 ENCOUNTER — Emergency Department (HOSPITAL_COMMUNITY)
Admission: EM | Admit: 2021-01-01 | Discharge: 2021-01-01 | Disposition: A | Discharge status: Home / Self Care | Payer: Medicare PPO | Attending: Emergency Medicine | Admitting: Emergency Medicine

## 2021-01-01 ENCOUNTER — Emergency Department (HOSPITAL_COMMUNITY): Payer: Medicare PPO

## 2021-01-01 DIAGNOSIS — R42 Dizziness and giddiness: Secondary | ICD-10-CM

## 2021-01-01 DIAGNOSIS — Z87891 Personal history of nicotine dependence: Secondary | ICD-10-CM | POA: Insufficient documentation

## 2021-01-01 DIAGNOSIS — Z79899 Other long term (current) drug therapy: Secondary | ICD-10-CM | POA: Insufficient documentation

## 2021-01-01 DIAGNOSIS — I10 Essential (primary) hypertension: Secondary | ICD-10-CM | POA: Insufficient documentation

## 2021-01-01 LAB — BASIC METABOLIC PANEL
Anion gap: 8 (ref 5–15)
BUN: 14 mg/dL (ref 8–23)
CO2: 27 mmol/L (ref 22–32)
Calcium: 9.4 mg/dL (ref 8.9–10.3)
Chloride: 103 mmol/L (ref 98–111)
Creatinine, Ser: 0.85 mg/dL (ref 0.44–1.00)
GFR, Estimated: >60 mL/min (ref >60)
Glucose, Bld: 102 mg/dL — ABNORMAL HIGH (ref 70–99)
Potassium: 4 mmol/L (ref 3.5–5.1)
Sodium: 138 mmol/L (ref 135–145)

## 2021-01-01 LAB — CBC WITH DIFFERENTIAL/PLATELET
Abs Immature Granulocytes: 0.02 10*3/uL (ref 0.00–0.07)
Basophils Absolute: 0 10*3/uL (ref 0.0–0.1)
Basophils Relative: 0 %
Eosinophils Absolute: 0 10*3/uL (ref 0.0–0.5)
Eosinophils Relative: 1 %
HCT: 39.6 % (ref 36.0–46.0)
Hemoglobin: 12.4 g/dL (ref 12.0–15.0)
Immature Granulocytes: 0 %
Lymphocytes Relative: 38 %
Lymphs Abs: 2 10*3/uL (ref 0.7–4.0)
MCH: 29.3 pg (ref 26.0–34.0)
MCHC: 31.3 g/dL (ref 30.0–36.0)
MCV: 93.6 fL (ref 80.0–100.0)
Monocytes Absolute: 0.5 10*3/uL (ref 0.1–1.0)
Monocytes Relative: 9 %
Neutro Abs: 2.7 10*3/uL (ref 1.7–7.7)
Neutrophils Relative %: 52 %
Platelets: 229 10*3/uL (ref 150–400)
RBC: 4.23 MIL/uL (ref 3.87–5.11)
RDW: 12.6 % (ref 11.5–15.5)
WBC: 5.2 10*3/uL (ref 4.0–10.5)
nRBC: 0 % (ref 0.0–0.2)

## 2021-01-01 NOTE — ED Triage Notes (Signed)
Seen 6/24 and diagnosed with dizziness, given and prescription and did not pick it up until today, states she cannot get in with the neurologist, here today for an MRI

## 2021-01-01 NOTE — Discharge Instructions (Addendum)
Exam and lab work imaging are all reassuring.  The meclizine as prescribed and take it as needed for dizziness.  Given you information about maneuvers I can help relieve dizziness.  Please review  Please follow-up with your neurologist and/or your PCP for further evaluation.  Come back to the emergency department if you develop chest pain, shortness of breath, severe abdominal pain, uncontrolled nausea, vomiting, diarrhea.

## 2021-01-01 NOTE — ED Provider Notes (Signed)
[facility] EMERGENCY DEPARTMENT Provider Note   CSN: [id number] Arrival date & time: 01/01/21  1337     History Chief Complaint  Patient presents with   Dizziness    [name] is a 75 y.o. female.  HPI  Patient with significant medical history of anxiety, diverticulitis, GERD, hypertension presents to the emergency department chief complaint of dizziness which is since resolved.  Patient states she woke up this morning rolled onto her left side and then rolled back and then felt as if the room was spinning, states that this lasted for an hour but then went away on its own.  She has associated headache feels in the front of her head, but denies change in vision, paresthesias or weakness in the upper lower extremities, denies recent head trauma, is not on anticoagulants she denies associated fevers, chills, no history of IV drug use.  Patient states that all of her symptoms since resolved she has no complaints at this time.  Patient does not endorse chest pain, shortness of breath, abdominal pain  After reviewing patient's chart patient was seen in the emergency department on 06/24 for same complaint, patient had CTA of the head and neck, CT head which were all unremarkable.  It was recommended for MRI for further work-up but patient declined as she did not want to go into [facility].  Patient was going to follow-up with neurology for further evaluation.  Patient states unfortunate she is unable to get a appointment with them until November.  She has not yet picked up her meclizine.  Past Medical History:  Diagnosis Date   Anxiety    Arthritis    Bronchiolitis    acute   Chronic back pain    Depression    Diverticulosis    GERD (gastroesophageal reflux disease)    negative H pylori stool Ag   High cholesterol    HTN (hypertension)    Hyperplastic colon polyp 11/08/08   IBS (irritable bowel syndrome)    Tubular adenoma of colon    Umbilical hernia    Wears glasses      Patient Active Problem List   Diagnosis Date Noted   Itching in the vaginal area 11/13/2020   Urinary tract infection with hematuria 05/19/2019   Hematuria 05/19/2019   Urinary frequency 05/19/2019   Numbness and tingling of foot 02/23/2019   BV (bacterial vaginosis) 12/04/2018   Vaginal discharge 12/09/2017   Vaginal atrophy 12/09/2017   Heme positive stool 05/01/2016   Abdominal pain 04/16/2016   Constipation 05/26/2013   Abdominal pain, other specified site 03/16/2013   Mixed hyperlipidemia 12/19/2010   Tubular adenoma of colon 11/14/2010   ABDOMINAL PAIN, LEFT LOWER QUADRANT 03/15/2010   EDEMA 08/25/2009   Musculoskeletal chest pain 08/25/2009   GERD 10/28/2008   HYPERTENSION 10/27/2008   Irritable bowel syndrome 10/27/2008   BACK PAIN, CHRONIC 10/27/2008   DIARRHEA 10/27/2008   ABDOMINAL CRAMPS 10/27/2008   COLONIC POLYPS, HX OF 10/27/2008    Past Surgical History:  Procedure Laterality Date   ABDOMINAL HYSTERECTOMY  1985   APPENDECTOMY     BIOPSY  05/09/2016   Procedure: BIOPSY;  Surgeon: Daneil Dolin, MD;  [facility];  Service: Endoscopy;;  bx ulcer at ileocecal valve   BREAST SURGERY  1989   both sides   CARPAL TUNNEL RELEASE Right 08/23/2013   Procedure: RIGHT ENDOSCOPIC CARPAL  TUNNEL RELEASE;  Surgeon: Jolyn Nap, MD;  Location: [place];  Service: Orthopedics;  Laterality: Right;   CATARACT EXTRACTION, BILATERAL     CERVICAL [place] SURGERY  2007   CHOLECYSTECTOMY  1980   COLONOSCOPY  11/08/08   Dr. Gala Romney- hemorrhoids, pancolonic diverticula,,hyperplastic polyp   COLONOSCOPY   08/05/2005   MWN:[id number] rectum/Diminutive polyp at 66 cm/Pancolonic diverticula/benign-appearing ulcer in the mid descending colon   COLONOSCOPY N/A 03/30/2013   GUY:QIHKVQQ anal canal hemorrhoids. Single colonic polyp-removed (tubular adenoma). Colonic diverticulosis.   COLONOSCOPY N/A 05/09/2016   Dr. Gala Romney: diverticulosis in entire colon, mucosal  ulceration at IC valve felt to be related to NSAID effect, otherwise normal. due for surveillance in 2022.    COLONOSCOPY WITH PROPOFOL N/A 11/02/2020   Procedure: COLONOSCOPY WITH PROPOFOL;  Surgeon: Daneil Dolin, MD;  [facility];  Service: Endoscopy;  Laterality: N/A;  AM   DORSAL COMPARTMENT RELEASE Left 12/12/2014   Procedure: LEFT WRIST RELEASE DORSAL COMPARTMENT (DEQUERVAIN);  Surgeon: Milly Jakob, MD;  Location: [place];  Service: Orthopedics;  Laterality: Left;   ECTOPIC PREGNANCY SURGERY     bilat   FOOT SURGERY Right 02/2017   NEPHRECTOMY  1975   left-infection   POLYPECTOMY  11/02/2020   Procedure: POLYPECTOMY INTESTINAL;  Surgeon: Daneil Dolin, MD;  [facility];  Service: Endoscopy;;   TRIGGER FINGER RELEASE Right 08/23/2013   Procedure: RIGHT INDEX FINGER TRIGGER RELEASE ;  Surgeon: Jolyn Nap, MD;  Location: [place];  Service: Orthopedics;  Laterality: Right;     OB History     Gravida  4   Para  1   Term  1   Preterm      AB  3   Living  1      SAB      IAB      Ectopic      Multiple      Live Births              Family History  Problem Relation Age of Onset   Cancer Brother 55       gastric   Heart attack Mother    Colon cancer Neg Hx    Colon polyps Neg Hx     Social History   Tobacco Use   Smoking status: Former    Packs/day: 0.25    Years: 30.00    Pack years: 7.50    Types: Cigarettes    Quit date: 08/19/2000    Years since quitting: 20.3   Smokeless tobacco: Never   Tobacco comments:    "smoked but not inhaled"  Vaping Use   Vaping Use: Never used  Substance Use Topics   Alcohol use: No    Alcohol/week: 0.0 standard drinks   Drug use: No    Home Medications Prior to Admission medications   Medication Sig Start Date End Date Taking? Authorizing Provider  albuterol (VENTOLIN HFA) 108 (90 Base) MCG/ACT inhaler INHALE 1 TO 2 PUFFS BY MOUTH FOUR TIMES  DAILY AS NEEDED AS DIRECTED FOR DIFFICULT BREATHING OR WHEEZING 05/29/20   [provider]  amLODipine (NORVASC) 5 MG tablet Take 5 mg by mouth at bedtime. 09/08/20   [provider]  amLODipine-valsartan (EXFORGE) 5-160 MG per tablet Take 1 tablet by mouth daily.    [provider]  Biotin 1000 MCG tablet Take 1,000 mcg by mouth daily.    [provider]  budesonide-formoterol (SYMBICORT) 160-4.5 MCG/ACT inhaler Inhale 2 puffs into the lungs daily as needed (asthma).  [provider]  Carboxymeth-Glyc-Polysorb PF (REFRESH DIGITAL PF) 0.5-1-0.5 % SOLN Place 1 drop into both eyes 3 (three) times daily as needed (dry eyes).    [provider]  Cholecalciferol (VITAMIN D) 50 MCG (2000 UT) tablet Take 2,000 Units by mouth daily.    [provider]  diclofenac Sodium (VOLTAREN) 1 % GEL Apply 1 application topically 3 (three) times daily as needed (pain).    [provider]  ELDERBERRY PO Take 2 each by mouth daily.    [provider]  escitalopram (LEXAPRO) 10 MG tablet Take 10 mg by mouth daily.    [provider]  fluvastatin (LESCOL) 20 MG capsule Take 20 mg by mouth 3 (three) times a week.    [provider]  furosemide (LASIX) 20 MG tablet Take 20 mg by mouth daily. 11/13/10   [provider]  HYDROcodone-acetaminophen (NORCO/VICODIN) 5-325 MG tablet Take 1 tablet by mouth 2 (two) times daily. 04/06/16   [provider]  hyoscyamine (LEVSIN SL) 0.125 MG SL tablet DISSOLVE ONE TAB UNDER TONGUE EVERY 4 HOURS AS NEEDED AND AT BEDTIME Patient taking differently: Take 0.125 mg by mouth every 4 (four) hours as needed for cramping. 12/30/19   Erenest Rasher, PA-C  meclizine (ANTIVERT) 25 MG tablet Take 1 tablet (25 mg total) by mouth 3 (three) times daily as needed for up to 30 doses for dizziness. 12/30/20   Wyvonnia Dusky, MD  Menthol, Topical Analgesic, (BIOFREEZE) 4 % GEL Apply 1  application topically daily as needed (pain).    [provider]  metoprolol tartrate (LOPRESSOR) 25 MG tablet Take 25 mg by mouth daily. 07/29/17   [provider]  metroNIDAZOLE (FLAGYL) 500 MG tablet Take 1 tablet (500 mg total) by mouth 2 (two) times daily. 12/22/20   Estill Dooms, NP  Multiple Vitamins-Minerals (MULTIVITAMIN ADULT) CHEW Chew 2 each by mouth daily.    [provider]  omeprazole (PRILOSEC) 20 MG capsule Take 20 mg by mouth daily as needed. For acid reflux    [provider]  OVER THE COUNTER MEDICATION Take 4-5 drops by mouth daily as needed (pain). CBD OIL as needed    [provider]  polyethylene glycol-electrolytes (TRILYTE) 420 g solution Take 4,000 mLs by mouth as directed. 09/19/20   Rourk, Cristopher Estimable, MD  primidone (MYSOLINE) 50 MG tablet TAKE 2 TABLETS BY MOUTH IN THE AM AND 1 TABLET IN THE PM Patient taking differently: Take 50-100 mg by mouth See admin instructions. TAKE 2 TABLETS (100 mg ) BY MOUTH IN THE AM AND 1 TABLET (50 mg) IN THE PM 10/03/20   Tat, Eustace Quail, DO  simethicone (MYLICON) 80 MG chewable tablet Chew 80 mg by mouth daily as needed (bloating).    [provider]    Allergies    Morphine and related  Review of Systems   Review of Systems  Constitutional:  Negative for chills and fever.  HENT:  Negative for congestion.   Respiratory:  Negative for shortness of breath.   Cardiovascular:  Negative for chest pain.  Gastrointestinal:  Negative for abdominal pain.  Genitourinary:  Negative for enuresis.  Musculoskeletal:  Negative for back pain.  Skin:  Negative for rash.  Neurological:  Negative for dizziness.  Hematological:  Does not bruise/bleed easily.   Physical Exam Updated Vital Signs BP 134/90   Pulse 69   Temp 98 F (36.7 C) (Oral)   Resp 14   SpO2 100%  Physical Exam Vitals and nursing note reviewed.  Constitutional:      General: She is not in acute distress.     Appearance: She is not ill-appearing.  HENT:     Head: Normocephalic and atraumatic.     Nose: No congestion.  Eyes:     General: No visual field deficit.    Extraocular Movements: Extraocular movements intact.     Conjunctiva/sclera: Conjunctivae normal.     Pupils: Pupils are equal, round, and reactive to light.  Cardiovascular:     Rate and Rhythm: Normal rate and regular rhythm.     Pulses: Normal pulses.     Heart sounds: No murmur heard.   No friction rub. No gallop.  Pulmonary:     Effort: No respiratory distress.     Breath sounds: No wheezing, rhonchi or rales.  Abdominal:     Palpations: Abdomen is soft.     Tenderness: There is no abdominal tenderness. There is no right CVA tenderness or left CVA tenderness.  Musculoskeletal:     Right lower leg: No edema.     Left lower leg: No edema.     Comments: Patient has 5/5 strength, full range of motion in the upper lower extremities, neurovascularly intact.  Skin:    General: Skin is warm and dry.  Neurological:     Mental Status: She is alert.     GCS: GCS eye subscore is 4. GCS verbal subscore is 5. GCS motor subscore is 6.     Cranial Nerves: No facial asymmetry.     Sensory: Sensation is intact.     Motor: No weakness.     Coordination: Romberg sign negative. Finger-Nose-Finger Test normal.     Gait: Gait normal.     Comments: Cranial nerves II through XII are grossly intact, patient have no difficulty word finding.    Psychiatric:        Mood and Affect: Mood normal.    ED Results / Procedures / Treatments   Labs (all labs ordered are listed, but only abnormal results are displayed) Labs Reviewed  BASIC METABOLIC PANEL - Abnormal; Notable for the following components:      Result Value   Glucose, Bld 102 (*)    All other components within normal limits  CBC WITH DIFFERENTIAL/PLATELET    EKG None  Radiology MR BRAIN WO CONTRAST  Result Date: 01/01/2021 CLINICAL DATA:  Dizziness EXAM: MRI HEAD WITHOUT  CONTRAST TECHNIQUE: Multiplanar, multiecho pulse sequences of the brain and surrounding structures were obtained without intravenous contrast. COMPARISON:  CT angio head and neck 12/29/2020 FINDINGS: Brain: Ventricle size and cerebral volume within normal limits for age. Mild to moderate white matter changes with scattered small subcortical and deep white matter hyperintensities bilaterally. Brainstem and cerebellum normal. Negative for acute infarct, hemorrhage, mass Vascular: Normal arterial flow voids. Skull and upper cervical spine: Negative Sinuses/Orbits: Paranasal sinuses clear. Bilateral cataract extraction Other: None IMPRESSION: No acute abnormality. Mild to moderate white matter changes consistent with chronic microvascular ischemia. Electronically Signed   By: Franchot Gallo M.D.   On: 01/01/2021 15:42    Procedures Procedures   Medications Ordered in ED Medications - No data to display  ED Course  I have reviewed the triage vital signs and the nursing notes.  Pertinent labs & imaging results that were available during my care of the patient were reviewed by me and considered in my medical decision making (see chart for details).    MDM Rules/Calculators/A&P  Initial impression-patient presents with dizziness which has since resolved.  She is alert, does not appear in acute stress, vital signs reassuring.  I suspect peripheral vertigo but cannot exceed the possibility of a occipital lobe CVA.  Will obtain MRI brain without contrast basic lab work-up and reassess.  Work-up-CBC unremarkable, BMP shows slight hyperglycemia 102.  MRI of brain was unremarkable for acute findings.  Reassessment-patient is reassessed continued no complaints at this time.  Vital signs remained stable.  Patient is in agreement with discharge.  Rule out-I have low suspicion for intracranial head bleed and/or mass as patient denies recent head trauma, patient had a negative CT scan 2  days prior.  Low suspicion for CVA and/or subarachnoid bleed as there is no focal deficits present on exam, MRI brain is negative for acute findings, patient also had a negative CTA of the brain 2 days prior.  Low suspicion for systemic infection and/or meningitis as there is no meningeal sign present, vital signs reassuring, no leukocytosis present.  Plan-  Dizziness since resolved-I suspect patient suffering from vertigo, will recommend meclizine, provide her with exercises to relieve vertigo, follow-up with neurology for further evaluation.  Vital signs have remained stable, no indication for hospital admission.  Patient discussed with attending and they agreed with assessment and plan.  Patient given at home care as well strict return precautions.  Patient verbalized that they understood agreed to said plan.  Final Clinical Impression(s) / ED Diagnoses Final diagnoses:  Dizziness    Rx / DC Orders ED Discharge Orders     None        Marcello Fennel, PA-C 01/01/21 1632    Maudie Flakes, MD 01/01/21 [phone]

## 2021-01-09 ENCOUNTER — Telehealth: Payer: Self-pay | Admitting: Adult Health

## 2021-01-09 NOTE — Telephone Encounter (Signed)
Has yellowish discharge but no odor, just watch for now has refill on flagyl if develops ordor

## 2021-01-09 NOTE — Telephone Encounter (Signed)
Patient called stating that that she would like a call back from [place], patient states that Anderson Malta has prescribed her some antibiotic and she has taken it all but she still has a little bit of discharge and she would like for Anderson Malta to know that it has not completely gone away and if she should just ride it out. Please contact pt

## 2021-02-12 ENCOUNTER — Telehealth: Payer: Self-pay | Admitting: Internal Medicine

## 2021-02-12 NOTE — Telephone Encounter (Signed)
Pt is having discomfort in her abdomin and her stools are going from constipation to diarrhea. She said Amitiza wasn't helping. She is aware of her OV in Sept and would like for the nurse to call her. [phone]

## 2021-02-12 NOTE — Telephone Encounter (Signed)
Pt states that she is experiencing diarrhea and constipation. Pt states that she is having lower left side abd pain after she eats or drinks water that hurts worse when she is sitting. Pt states that she took Amitiza for 2/3 days and when constipated and the constipation subsided. Pt states that today she has diarrhea. Pt denies having any nausea, vomiting, fever or rectal bleeding.

## 2021-02-13 NOTE — Telephone Encounter (Signed)
What dosage is Amitiza? She can try SPARINGLY the hyoscyamine (levsin) tablet for cramping but this can cause constipation, so just use only if needed.   If Amitiza is 24 mcg, we could reduce it down to once daily as needed, novel dosing approach, or even 8 mcg daily to BID to help keep her on track.  Need to sort out further at office visit in September with Cyril Mourning.

## 2021-02-13 NOTE — Telephone Encounter (Signed)
Pt states that she contacted her hernia Dr. Deborha Payment are able to see her on the 29th, but pt states that the pain is too much to wait that long. Pt was instructed by hernia Dr. To go to the ED. Pt stated that she was taking the 24 mcg of Amitiza and the hyoscyamine at times when she would cramp up. Pt plans on going to the ED shortly for further evaluation.

## 2021-02-13 NOTE — Telephone Encounter (Signed)
LMOM for pt to return call. 

## 2021-02-14 ENCOUNTER — Encounter: Payer: Self-pay | Admitting: Internal Medicine

## 2021-02-20 ENCOUNTER — Telehealth: Payer: Self-pay | Admitting: Internal Medicine

## 2021-02-20 NOTE — Telephone Encounter (Signed)
Noted. No additional recommendations.

## 2021-02-20 NOTE — Telephone Encounter (Signed)
Please call the patient when you can, she has some questions about her medication

## 2021-02-20 NOTE — Telephone Encounter (Signed)
Pt called stating that she is no longer having abd pain or cramping. Pt states that she does have constipation and diarrhea from time to time. She is having a bout with constipation at the moment, but states that she started taking Amitiza today to try to correct it.

## 2021-03-03 ENCOUNTER — Other Ambulatory Visit: Payer: Self-pay | Admitting: Neurology

## 2021-03-06 ENCOUNTER — Encounter: Payer: Self-pay | Admitting: Gastroenterology

## 2021-03-06 ENCOUNTER — Other Ambulatory Visit: Payer: Self-pay

## 2021-03-06 ENCOUNTER — Ambulatory Visit: Payer: Medicare PPO | Admitting: Gastroenterology

## 2021-03-06 VITALS — BP 147/90 | HR 72 | Temp 97.5°F | Ht 67.0 in | Wt 165.8 lb

## 2021-03-06 DIAGNOSIS — K589 Irritable bowel syndrome without diarrhea: Secondary | ICD-10-CM | POA: Insufficient documentation

## 2021-03-06 DIAGNOSIS — R1032 Left lower quadrant pain: Secondary | ICD-10-CM

## 2021-03-06 DIAGNOSIS — K582 Mixed irritable bowel syndrome: Secondary | ICD-10-CM | POA: Diagnosis not present

## 2021-03-06 MED ORDER — LUBIPROSTONE 8 MCG PO CAPS: ORAL_CAPSULE | ORAL | 3 refills | Status: DC

## 2021-03-06 NOTE — Patient Instructions (Signed)
Start Amitiza 15mg with food once or twice daily for constipation. You can hold or skip dose if stool too loose.  You can use hyoscyamine up to four times daily for abdominal cramping or if you have frequent stools. Keep in mind if you use on regularly basis this can contribute to constipation so you may have to limit use. Call in a week and let me know how you are doing. Call sooner if your abdominal pain persists or worsens. Depending on how you do, we may need to consider repeating CT scan of your abdomen.

## 2021-03-06 NOTE — Progress Notes (Signed)
Primary Care Physician: Tisovec, Fransico Him, MD  Primary Gastroenterologist:  Garfield Cornea, MD   Chief Complaint  Patient presents with   Abdominal Pain    Llq pain x 2 weeks   change in bowels    Alternates constipation with diarrhea    HPI: [name] is a 75 y.o. female here for follow-up.  She was last seen in the office in March.  She has a history of GERD and IBS.  History of lateral abdominal wall hernia status post nephrectomy in remote past.  Colonoscopy April 2022: -Non-bleeding internal hemorrhoids. -Two 4 to 6 mm polyps at the hepatic flexure, removed with a cold snare. Resected and retrieved. -Diverticulosis in the entire examined colon. Normal-appearing TI -The examination was otherwise normal on direct and retroflexion views. -Tubular adenomas noted on path -Surveillance colonoscopy can be considered in 5 years if health permits.  Today: Patient states she had some left lower quadrant pain for a couple of weeks.  Was constant in nature.  Shooting type pain at times.  Sometimes worse with movement.  Started in the left flank.  Settled to the left lower quadrant.  She was not sure if it was related to her left lateral wall hernia.  Pain lasted for couple of weeks and then went away.  Last night she started feeling some discomfort again.  She thought maybe it was from constipation.  She has had some increased issues with constipation, self-medicating with smooth move and taking prunes. Took some last night and this morning has had 3-4 loose stools.  She does take hyoscyamine as needed for abdominal cramping but has not tried it for this left-sided abdominal pain.  Denies any heartburn, vomiting, dysphagia  Patient reports that she has been evaluated for her left lateral abdominal wall hernia and advised to avoid surgery if possible.  Current Outpatient Medications  Medication Sig Dispense Refill   albuterol (VENTOLIN HFA) 108 (90 Base) MCG/ACT inhaler INHALE 1  TO 2 PUFFS BY MOUTH FOUR TIMES DAILY AS NEEDED AS DIRECTED FOR DIFFICULT BREATHING OR WHEEZING     amLODipine-valsartan (EXFORGE) 5-160 MG per tablet Take 1 tablet by mouth daily.     Biotin 1000 MCG tablet Take 1,000 mcg by mouth daily.     budesonide-formoterol (SYMBICORT) 160-4.5 MCG/ACT inhaler Inhale 2 puffs into the lungs daily as needed (asthma).     Carboxymeth-Glyc-Polysorb PF (REFRESH DIGITAL PF) 0.5-1-0.5 % SOLN Place 1 drop into both eyes 3 (three) times daily as needed (dry eyes).     Cholecalciferol (VITAMIN D) 50 MCG (2000 UT) tablet Take 2,000 Units by mouth daily.     diclofenac Sodium (VOLTAREN) 1 % GEL Apply 1 application topically 3 (three) times daily as needed (pain).     ELDERBERRY PO Liquid drink daily     escitalopram (LEXAPRO) 10 MG tablet Take 10 mg by mouth daily.     fluvastatin (LESCOL) 20 MG capsule Take 20 mg by mouth 3 (three) times a week.     furosemide (LASIX) 20 MG tablet Take 20 mg by mouth daily.     HYDROcodone-acetaminophen (NORCO/VICODIN) 5-325 MG tablet Take 1 tablet by mouth 2 (two) times daily.     hyoscyamine (LEVSIN SL) 0.125 MG SL tablet DISSOLVE ONE TAB UNDER TONGUE EVERY 4 HOURS AS NEEDED AND AT BEDTIME 120 tablet 3   Menthol, Topical Analgesic, (BIOFREEZE) 4 % GEL Apply 1 application topically daily as needed (pain).     metoprolol tartrate (LOPRESSOR)  25 MG tablet Take 25 mg by mouth daily.  5   Multiple Vitamins-Minerals (MULTIVITAMIN ADULT) CHEW Chew 2 each by mouth daily.     omeprazole (PRILOSEC) 20 MG capsule Take 20 mg by mouth daily as needed. For acid reflux     primidone (MYSOLINE) 50 MG tablet TAKE 2 TABLETS BY MOUTH IN THE AM AND 1 TABLET IN THE PM 270 tablet 0   simethicone (MYLICON) 80 MG chewable tablet Chew 80 mg by mouth daily as needed (bloating).     No current facility-administered medications for this visit.    Allergies as of 03/06/2021 - Review Complete 03/06/2021  Allergen Reaction Noted   Morphine and related  Hypertension 11/11/2013    ROS:  General: Negative for anorexia, weight loss, fever, chills, fatigue, weakness. ENT: Negative for hoarseness, difficulty swallowing , nasal congestion. CV: Negative for chest pain, angina, palpitations, dyspnea on exertion, peripheral edema.  Respiratory: Negative for dyspnea at rest, dyspnea on exertion, cough, sputum, wheezing.  GI: See history of present illness. GU:  Negative for dysuria, hematuria, urinary incontinence, urinary frequency, nocturnal urination.  Endo: Negative for unusual weight change.    Physical Examination:   BP (!) 147/90   Pulse 72   Temp (!) 97.5 F (36.4 C)   Ht '5\' 7"'$  (1.702 m)   Wt 165 lb 12.8 oz (75.2 kg)   BMI 25.97 kg/m   General: Well-nourished, well-developed in no acute distress.  Eyes: No icterus. Mouth: masked Lungs: Clear to auscultation bilaterally.  Heart: Regular rate and rhythm, no murmurs rubs or gallops.  Abdomen: Bowel sounds are normal, nondistended, no hepatosplenomegaly or masses, no abdominal bruits, no rebound or guarding.  Mild left lower quadrant tenderness.  Left abdomen/flank area more full than on the right. Extremities: No lower extremity edema. No clubbing or deformities. Neuro: Alert and oriented x 4   Skin: Warm and dry, no jaundice.   Psych: Alert and cooperative, normal mood and affect.  Labs:  Lab Results  Component Value Date   CREATININE 0.85 01/01/2021   BUN 14 01/01/2021   NA 138 01/01/2021   K 4.0 01/01/2021   CL 103 01/01/2021   CO2 27 01/01/2021    Lab Results  Component Value Date   WBC 5.2 01/01/2021   HGB 12.4 01/01/2021   HCT 39.6 01/01/2021   MCV 93.6 01/01/2021   PLT 229 01/01/2021     Imaging Studies: No results found.   Assessment:  Pleasant 75 year old female with history of GERD and IBS presenting for follow-up.   Earlier last month she had a 2-week history of left-sided abdominal pain which began in the left upper abdomen and settled into the  left lower quadrant.  She was unsure if it was related to her known left lateral abdominal wall hernia.  Symptoms finally settled down.    Last night she started feeling some of the pain again.  She thought it might be related to constipation therefore took smooth move and ate prunes.  She has now had multiple loose stools.  Feeling better today.  Denies significant abdominal pain.    She has had more predominance of constipation lately although has some intermittent diarrhea.  It is not clear whether this is related to her bowel regimen or fluctuation with her IBS.  Can go several days without a BM and then has multiple BMs over the course of 24 hours.  At this time we will treat her constipation, hopefully her bowel movements will regulate.  If any recurrent abdominal pain she may require further imaging.   Plan: Trial of Amitiza 8 mcg with food once or twice daily for constipation.  Hold or skip a dose if stool is too loose or frequent. Use hyoscyamine up to 4 times daily for abdominal cramping. Progress report in 1 week.  If abdominal pain persist or worsens, will need further work-up.

## 2021-03-07 ENCOUNTER — Telehealth: Payer: Self-pay | Admitting: Gastroenterology

## 2021-03-07 NOTE — Telephone Encounter (Signed)
PA for Lubiprostone 8 mg capsules was approved until 07/07/2021. Approval letter will be scanned into pt's chart.

## 2021-03-08 ENCOUNTER — Encounter: Payer: Self-pay | Admitting: Gastroenterology

## 2021-03-09 DIAGNOSIS — E663 Overweight: Secondary | ICD-10-CM | POA: Diagnosis not present

## 2021-03-09 DIAGNOSIS — F419 Anxiety disorder, unspecified: Secondary | ICD-10-CM | POA: Diagnosis not present

## 2021-03-09 DIAGNOSIS — I10 Essential (primary) hypertension: Secondary | ICD-10-CM | POA: Diagnosis not present

## 2021-03-09 DIAGNOSIS — K581 Irritable bowel syndrome with constipation: Secondary | ICD-10-CM | POA: Diagnosis not present

## 2021-03-09 DIAGNOSIS — G250 Essential tremor: Secondary | ICD-10-CM | POA: Diagnosis not present

## 2021-03-09 DIAGNOSIS — E785 Hyperlipidemia, unspecified: Secondary | ICD-10-CM | POA: Diagnosis not present

## 2021-03-09 DIAGNOSIS — J439 Emphysema, unspecified: Secondary | ICD-10-CM | POA: Diagnosis not present

## 2021-03-09 DIAGNOSIS — K219 Gastro-esophageal reflux disease without esophagitis: Secondary | ICD-10-CM | POA: Diagnosis not present

## 2021-03-09 DIAGNOSIS — G8929 Other chronic pain: Secondary | ICD-10-CM | POA: Diagnosis not present

## 2021-03-22 ENCOUNTER — Emergency Department (HOSPITAL_COMMUNITY): Payer: Medicare PPO

## 2021-03-22 ENCOUNTER — Ambulatory Visit: Payer: Medicare PPO | Admitting: Nurse Practitioner

## 2021-03-22 ENCOUNTER — Encounter (HOSPITAL_COMMUNITY): Payer: Self-pay | Admitting: Emergency Medicine

## 2021-03-22 ENCOUNTER — Emergency Department (HOSPITAL_COMMUNITY)
Admission: EM | Admit: 2021-03-22 | Discharge: 2021-03-22 | Disposition: A | Discharge status: Home / Self Care | Payer: Medicare PPO | Attending: Emergency Medicine | Admitting: Emergency Medicine

## 2021-03-22 ENCOUNTER — Other Ambulatory Visit: Payer: Self-pay

## 2021-03-22 DIAGNOSIS — Z87891 Personal history of nicotine dependence: Secondary | ICD-10-CM | POA: Diagnosis not present

## 2021-03-22 DIAGNOSIS — I10 Essential (primary) hypertension: Secondary | ICD-10-CM | POA: Insufficient documentation

## 2021-03-22 DIAGNOSIS — M436 Torticollis: Secondary | ICD-10-CM | POA: Insufficient documentation

## 2021-03-22 DIAGNOSIS — Z79899 Other long term (current) drug therapy: Secondary | ICD-10-CM | POA: Diagnosis not present

## 2021-03-22 DIAGNOSIS — M542 Cervicalgia: Secondary | ICD-10-CM | POA: Diagnosis not present

## 2021-03-22 DIAGNOSIS — R519 Headache, unspecified: Secondary | ICD-10-CM | POA: Insufficient documentation

## 2021-03-22 MED ORDER — CYCLOBENZAPRINE HCL 10 MG PO TABS
10.0000 mg | ORAL_TABLET | Freq: Once | ORAL | Status: AC
  Administered 2021-03-22: 10 mg via ORAL
  Filled 2021-03-22: qty 1

## 2021-03-22 MED ORDER — CYCLOBENZAPRINE HCL 5 MG PO TABS: 5.0000 mg | ORAL_TABLET | Freq: Two times a day (BID) | ORAL | 0 refills | Status: AC | PRN

## 2021-03-22 NOTE — ED Provider Notes (Signed)
[facility] EMERGENCY DEPARTMENT Provider Note   CSN: [id number] Arrival date & time: 03/22/21  1253     History Chief Complaint  Patient presents with   Torticollis    [name] is a 75 y.o. female with PMHx HTN, HLD, GERD, IBS who presents for evaluation of neck pain.   Patient states that she has experienced an approximately 2-day history of right-sided neck pain.  She states that the pain began after she woke up in the morning.  She notes that the muscles in the right side of her neck feels stiff and spasm when she tries to move.  She feels it is difficult to move her head across the midline towards the right side.  She denies any numbness, weakness, or grip changes.  No chest pain or shortness of breath.  No recent fever, chills, or infectious symptoms.  She denies any severe headache, vision changes, jaw claudication, or sore throat.  She has been using warm compresses and lotion without improvement.  She subsequently presented to our emergency department for further evaluation.     Past Medical History:  Diagnosis Date   Anxiety    Arthritis    Bronchiolitis    acute   Chronic back pain    Depression    Diverticulosis    GERD (gastroesophageal reflux disease)    negative H pylori stool Ag   High cholesterol    HTN (hypertension)    Hyperplastic colon polyp 11/08/08   IBS (irritable bowel syndrome)    Tubular adenoma of colon    Umbilical hernia    Wears glasses     Patient Active Problem List   Diagnosis Date Noted   IBS (irritable bowel syndrome)    Itching in the vaginal area 11/13/2020   Urinary tract infection with hematuria 05/19/2019   Hematuria 05/19/2019   Urinary frequency 05/19/2019   Numbness and tingling of foot 02/23/2019   BV (bacterial vaginosis) 12/04/2018   Vaginal discharge 12/09/2017   Vaginal atrophy 12/09/2017   Heme positive stool 05/01/2016   Abdominal pain 04/16/2016   Constipation 05/26/2013   Abdominal pain,  other specified site 03/16/2013   Mixed hyperlipidemia 12/19/2010   Tubular adenoma of colon 11/14/2010   ABDOMINAL PAIN, LEFT LOWER QUADRANT 03/15/2010   EDEMA 08/25/2009   Musculoskeletal chest pain 08/25/2009   GERD 10/28/2008   HYPERTENSION 10/27/2008   Irritable bowel syndrome 10/27/2008   BACK PAIN, CHRONIC 10/27/2008   DIARRHEA 10/27/2008   ABDOMINAL CRAMPS 10/27/2008   COLONIC POLYPS, HX OF 10/27/2008    Past Surgical History:  Procedure Laterality Date   ABDOMINAL HYSTERECTOMY  1985   APPENDECTOMY     BIOPSY  05/09/2016   Procedure: BIOPSY;  Surgeon: Daneil Dolin, MD;  [facility];  Service: Endoscopy;;  bx ulcer at ileocecal valve   BREAST SURGERY  1989   both sides   CARPAL TUNNEL RELEASE Right 08/23/2013   Procedure: RIGHT ENDOSCOPIC CARPAL  TUNNEL RELEASE;  Surgeon: Jolyn Nap, MD;  Location: [place];  Service: Orthopedics;  Laterality: Right;   CATARACT EXTRACTION, BILATERAL     CERVICAL [place] SURGERY  2007   CHOLECYSTECTOMY  1980   COLONOSCOPY  11/08/08   Dr. Gala Romney- hemorrhoids, pancolonic diverticula,,hyperplastic polyp   COLONOSCOPY   08/05/2005   [id number] rectum/Diminutive polyp at 73 cm/Pancolonic diverticula/benign-appearing ulcer in the mid descending colon   COLONOSCOPY N/A 03/30/2013   [id number] anal canal hemorrhoids. Single colonic polyp-removed (tubular adenoma).  Colonic diverticulosis.   COLONOSCOPY N/A 05/09/2016   Dr. Gala Romney: diverticulosis in entire colon, mucosal ulceration at IC valve felt to be related to NSAID effect, otherwise normal. due for surveillance in 2022.    COLONOSCOPY WITH PROPOFOL N/A 11/02/2020   Procedure: COLONOSCOPY WITH PROPOFOL;  Surgeon: Daneil Dolin, MD;  Location: [facility];  Service: Endoscopy;  Laterality: N/A;  AM   DORSAL COMPARTMENT RELEASE Left 12/12/2014   Procedure: LEFT WRIST RELEASE DORSAL COMPARTMENT (DEQUERVAIN);  Surgeon: Milly Jakob, MD;  Location: [place];  Service: Orthopedics;  Laterality: Left;   ECTOPIC PREGNANCY SURGERY     bilat   FOOT SURGERY Right 02/2017   NEPHRECTOMY  1975   left-infection   POLYPECTOMY  11/02/2020   Procedure: POLYPECTOMY INTESTINAL;  Surgeon: Daneil Dolin, MD;  [facility]: [facility];  Service: Endoscopy;;   TRIGGER FINGER RELEASE Right 08/23/2013   Procedure: RIGHT INDEX FINGER TRIGGER RELEASE ;  Surgeon: Jolyn Nap, MD;  Location: [place];  Service: Orthopedics;  Laterality: Right;     OB History     Gravida  4   Para  1   Term  1   Preterm      AB  3   Living  1      SAB      IAB      Ectopic      Multiple      Live Births              Family History  Problem Relation Age of Onset   Cancer Brother 54       gastric   Heart attack Mother    Colon cancer Neg Hx    Colon polyps Neg Hx     Social History   Tobacco Use   Smoking status: Former    Packs/day: 0.25    Years: 30.00    Pack years: 7.50    Types: Cigarettes    Quit date: 08/19/2000    Years since quitting: 20.6   Smokeless tobacco: Never   Tobacco comments:    "smoked but not inhaled"  Vaping Use   Vaping Use: Never used  Substance Use Topics   Alcohol use: No    Alcohol/week: 0.0 standard drinks   Drug use: No    Home Medications Prior to Admission medications   Medication Sig Start Date End Date Taking? Authorizing Provider  cyclobenzaprine (FLEXERIL) 5 MG tablet Take 1 tablet (5 mg total) by mouth 2 (two) times daily as needed for up to 5 days for muscle spasms. 03/22/21 03/27/21 Yes Violet Baldy, MD  albuterol (VENTOLIN HFA) 108 (90 Base) MCG/ACT inhaler INHALE 1 TO 2 PUFFS BY MOUTH FOUR TIMES DAILY AS NEEDED AS DIRECTED FOR DIFFICULT BREATHING OR WHEEZING 05/29/20   [provider]  amLODipine-valsartan (EXFORGE) 5-160 MG per tablet Take 1 tablet by mouth daily.    [provider]  Biotin 1000 MCG tablet Take 1,000 mcg by mouth daily.    [provider]  budesonide-formoterol (SYMBICORT) 160-4.5 MCG/ACT inhaler Inhale 2 puffs into the lungs daily as needed (asthma).    [provider]  Carboxymeth-Glyc-Polysorb PF (REFRESH DIGITAL PF) 0.5-1-0.5 % SOLN Place 1 drop into both eyes 3 (three) times daily as needed (dry eyes).    [provider]  Cholecalciferol (VITAMIN D) 50 MCG (2000 UT) tablet Take 2,000 Units by mouth daily.    [provider]  diclofenac Sodium (  VOLTAREN) 1 % GEL Apply 1 application topically 3 (three) times daily as needed (pain).    [provider]  ELDERBERRY PO Liquid drink daily    [provider]  escitalopram (LEXAPRO) 10 MG tablet Take 10 mg by mouth daily.    [provider]  fluvastatin (LESCOL) 20 MG capsule Take 20 mg by mouth 3 (three) times a week.    [provider]  furosemide (LASIX) 20 MG tablet Take 20 mg by mouth daily. 11/13/10   [provider]  HYDROcodone-acetaminophen (NORCO/VICODIN) 5-325 MG tablet Take 1 tablet by mouth 2 (two) times daily. 04/06/16   [provider]  hyoscyamine (LEVSIN SL) 0.125 MG SL tablet DISSOLVE ONE TAB UNDER TONGUE EVERY 4 HOURS AS NEEDED AND AT BEDTIME 01/03/21   Mahala Menghini, PA-C  lubiprostone (AMITIZA) 8 MCG capsule Take one capsule with food once or twice daily for constipation/IBS 03/06/21   Mahala Menghini, PA-C  Menthol, Topical Analgesic, (BIOFREEZE) 4 % GEL Apply 1 application topically daily as needed (pain).    [provider]  metoprolol tartrate (LOPRESSOR) 25 MG tablet Take 25 mg by mouth daily. 07/29/17   [provider]  Multiple Vitamins-Minerals (MULTIVITAMIN ADULT) CHEW Chew 2 each by mouth daily.    [provider]  omeprazole (PRILOSEC) 20 MG capsule Take 20 mg by mouth daily as needed. For acid reflux    [provider]  primidone (MYSOLINE) 50 MG tablet TAKE 2 TABLETS BY MOUTH IN THE AM AND 1 TABLET IN THE PM 03/05/21   Tat,  Eustace Quail, DO  simethicone (MYLICON) 80 MG chewable tablet Chew 80 mg by mouth daily as needed (bloating).    [provider]    Allergies    Morphine and related  Review of Systems   Review of Systems  Constitutional:  Negative for chills and fever.  HENT:  Negative for ear pain and sore throat.   Eyes:  Negative for pain and visual disturbance.  Respiratory:  Negative for cough and shortness of breath.   Cardiovascular:  Negative for chest pain and palpitations.  Gastrointestinal:  Negative for abdominal pain and vomiting.  Genitourinary:  Negative for dysuria and hematuria.  Musculoskeletal:  Positive for neck pain. Negative for arthralgias and back pain.  Skin:  Negative for color change and rash.  Neurological:  Negative for seizures and syncope.  All other systems reviewed and are negative.  Physical Exam Updated Vital Signs BP (!) 175/96 (BP Location: Left Arm)   Pulse 85   Temp 99.1 F (37.3 C) (Oral)   Resp 14   SpO2 99%   Physical Exam Vitals and nursing note reviewed.  Constitutional:      General: She is not in acute distress.    Appearance: She is well-developed.  HENT:     Head: Normocephalic and atraumatic.  Eyes:     Conjunctiva/sclera: Conjunctivae normal.  Neck:     Comments: Tenderness over the right aspect of the neck.  No overlying skin changes.  No palpable mass.  No midline cervical spine tenderness to palpation.  No meningismus. Cardiovascular:     Rate and Rhythm: Normal rate and regular rhythm.     Heart sounds: No murmur heard. Pulmonary:     Effort: Pulmonary effort is normal. No respiratory distress.     Breath sounds: Normal breath sounds.  Abdominal:     Palpations: Abdomen is soft.     Tenderness: There is no abdominal tenderness.  Musculoskeletal:     Cervical back: Neck supple. Tenderness present.  Skin:    General: Skin is warm and dry.     Capillary Refill: Capillary refill takes less than 2 seconds.  Neurological:      General: No focal deficit present.     Mental Status: She is alert and oriented to person, place, and time. Mental status is at baseline.     Cranial Nerves: No cranial nerve deficit.     Sensory: No sensory deficit.     Motor: No weakness.     Comments: No focal neurologic deficits.  Intact strength and sensation throughout all 4 extremities.  Cranial nerves intact.    ED Results / Procedures / Treatments   Labs (all labs ordered are listed, but only abnormal results are displayed) Labs Reviewed - No data to display  EKG None  Radiology CT HEAD WO CONTRAST (5MM)  Result Date: 03/22/2021 CLINICAL DATA:  Headache and neck pain without known injury. EXAM: CT HEAD WITHOUT CONTRAST CT CERVICAL SPINE WITHOUT CONTRAST TECHNIQUE: Multidetector CT imaging of the head and cervical spine was performed following the standard protocol without intravenous contrast. Multiplanar CT image reconstructions of the cervical spine were also generated. COMPARISON:  October 23, 2004. FINDINGS: CT HEAD FINDINGS Brain: No evidence of acute infarction, hemorrhage, hydrocephalus, extra-axial collection or mass lesion/mass effect. Vascular: No hyperdense vessel or unexpected calcification. Skull: Normal. Negative for fracture or focal lesion. Sinuses/Orbits: No acute finding. Other: None. CT CERVICAL SPINE FINDINGS Alignment: Reversal of normal lordosis of cervical spine is noted which most likely is positional in origin. Skull base and vertebrae: No acute fracture. No primary bone lesion or focal pathologic process. Soft tissues and spinal canal: No prevertebral fluid or swelling. No visible canal hematoma. Disc levels: Status post surgical anterior fusion of C5-6. Severe degenerative disc disease is noted at C4-5, C6-7 and C7-T1. Upper chest: Negative. Other: None. IMPRESSION: No acute intracranial abnormality seen. Postsurgical and degenerative changes are noted in the cervical spine as described above. No acute  abnormality is noted. Electronically Signed   By: Marijo Conception M.D.   On: 03/22/2021 15:07   CT Cervical Spine Wo Contrast  Result Date: 03/22/2021 CLINICAL DATA:  Headache and neck pain without known injury. EXAM: CT HEAD WITHOUT CONTRAST CT CERVICAL SPINE WITHOUT CONTRAST TECHNIQUE: Multidetector CT imaging of the head and cervical spine was performed following the standard protocol without intravenous contrast. Multiplanar CT image reconstructions of the cervical spine were also generated. COMPARISON:  October 23, 2004. FINDINGS: CT HEAD FINDINGS Brain: No evidence of acute infarction, hemorrhage, hydrocephalus, extra-axial collection or mass lesion/mass effect. Vascular: No hyperdense vessel or unexpected calcification. Skull: Normal. Negative for fracture or focal lesion. Sinuses/Orbits: No acute finding. Other: None. CT CERVICAL SPINE FINDINGS Alignment: Reversal of normal lordosis of cervical spine is noted which most likely is positional in origin. Skull base and vertebrae: No acute fracture. No primary bone lesion or focal pathologic process. Soft tissues and spinal canal: No prevertebral fluid or swelling. No visible canal hematoma. Disc levels: Status post surgical anterior fusion of C5-6. Severe degenerative disc disease is noted at C4-5, C6-7 and C7-T1. Upper chest: Negative. Other: None. IMPRESSION: No acute intracranial abnormality seen. Postsurgical and degenerative changes are noted in the cervical spine as described above. No acute abnormality is noted. Electronically Signed   By: Marijo Conception M.D.   On: 03/22/2021 15:07    Procedures Procedures   Medications Ordered in ED Medications  cyclobenzaprine (FLEXERIL) tablet 10 mg (10 mg Oral Given 03/22/21 1927)    ED Course  I have reviewed the triage vital signs and the nursing notes.  Pertinent labs & imaging results that were available during my care of the patient were reviewed by me and considered in my medical decision making  (see chart for details).    MDM Rules/Calculators/A&P                           74 y.o. female with past medical history as above who presents for evaluation of neck pain. Afebrile and hemodynamically stable.  Exam as detailed above.  CT head and CT cervical spine were obtained and unremarkable for acute pathology.  No evidence of acute fracture or malalignment.  No evidence of focal pathologic process.  No prevertebral fluid or swelling.  Presentation is consistent with torticollis.  Patient was treated symptomatically with Flexeril with improvement, as well as a prescribed a short course at discharge.  Final Clinical Impression(s) / ED Diagnoses Final diagnoses:  Torticollis, acute    Rx / DC Orders ED Discharge Orders          Ordered    cyclobenzaprine (FLEXERIL) 5 MG tablet  2 times daily PRN        03/22/21 1909             Violet Baldy, MD 03/23/21 [id number]    Carmin Muskrat, MD 03/26/21 [phone]

## 2021-03-22 NOTE — ED Triage Notes (Signed)
Pt states she has sharp right sided neck pain that hurts to turn her head. Pt has intermittent headaches with the pain.

## 2021-03-22 NOTE — ED Provider Notes (Signed)
Emergency Medicine Provider Triage Evaluation Note  [name] , a 75 y.o. female  was evaluated in triage.  Pt complains of neck pain that started a few days ago. Also having headache.  Review of Systems  Positive: Neck pain, headache Negative: Chest pain, numbness/weakness  Physical Exam  BP (!) 143/85 (BP Location: Left Arm)   Pulse 81   Temp 99.1 F (37.3 C)   Resp 17   SpO2 93%  Gen:   Awake, no distress   Resp:  Normal effort  MSK:   Moves extremities without difficulty  Other:  Ttp along the scm  Medical Decision Making  Medically screening exam initiated at 1:13 PM.  Appropriate orders placed.  [name] was informed that the remainder of the evaluation will be completed by another provider, this initial triage assessment does not replace that evaluation, and the importance of remaining in the ED until their evaluation is complete.     Rodney Booze, PA-C 03/22/21 1314    Lorelle Gibbs, DO 03/22/21 1530

## 2021-03-23 ENCOUNTER — Ambulatory Visit: Payer: Medicare PPO | Admitting: Gastroenterology

## 2021-03-28 ENCOUNTER — Encounter: Payer: Self-pay | Admitting: Internal Medicine

## 2021-03-28 ENCOUNTER — Telehealth: Payer: Self-pay | Admitting: Gastroenterology

## 2021-03-28 ENCOUNTER — Telehealth: Payer: Self-pay | Admitting: Internal Medicine

## 2021-03-28 MED ORDER — TRULANCE 3 MG PO TABS: 3.0000 mg | ORAL_TABLET | Freq: Every day | ORAL | 1 refills | Status: DC

## 2021-03-28 NOTE — Telephone Encounter (Signed)
Pt states that the generic Amitiza was going to cost her $100 a month. Pt states that she is not having any abdominal pain. Pt does not have a follow up appointment scheduled. When would you like for the pt to follow up so that we can get that scheduled?

## 2021-03-28 NOTE — Telephone Encounter (Signed)
PATIENT CALLED TO LET us KNOW THE LAST MEDICATION THAT WAS SENT IN FOR HER CONSTIPATION WAS TOO EXPENSIVE.  SHE HAS BEEN DOING OK THOUGH AND SAID THAT IF SHE HAS ANOTHER FLAIR UP SHE WILL TAKE THE AMITEZA SHE HAS LEFT.

## 2021-03-28 NOTE — Telephone Encounter (Signed)
Ov 3-4 months. Please let pt know. I have sent in Trulance 3mg  daily for constipation as needed.

## 2021-03-28 NOTE — Telephone Encounter (Signed)
Last medication I sent in was generic Amitiza.   Tammy, please talk with patient and make sure she is good from standpoint of abdominal pain.   Let's make sure she has follow up ov scheduled.

## 2021-03-28 NOTE — Addendum Note (Signed)
Addended by: Mahala Menghini on: 03/28/2021 03:19 PM   Modules accepted: Orders

## 2021-03-28 NOTE — Telephone Encounter (Signed)
FYI: Leslie  °

## 2021-03-28 NOTE — Telephone Encounter (Signed)
Pt was made aware. Forwarded to Wayne to schedule an appt.

## 2021-03-28 NOTE — Telephone Encounter (Signed)
PA for Trulance 3 mg tablets has been approved. Approval letter will be scanned into patient's chart.

## 2021-03-29 ENCOUNTER — Other Ambulatory Visit (HOSPITAL_COMMUNITY): Payer: Self-pay | Admitting: Internal Medicine

## 2021-03-29 DIAGNOSIS — Z1231 Encounter for screening mammogram for malignant neoplasm of breast: Secondary | ICD-10-CM

## 2021-03-29 DIAGNOSIS — M65332 Trigger finger, left middle finger: Secondary | ICD-10-CM | POA: Diagnosis not present

## 2021-04-17 DIAGNOSIS — E7800 Pure hypercholesterolemia, unspecified: Secondary | ICD-10-CM | POA: Diagnosis not present

## 2021-04-17 DIAGNOSIS — I10 Essential (primary) hypertension: Secondary | ICD-10-CM | POA: Diagnosis not present

## 2021-04-24 DIAGNOSIS — M791 Myalgia, unspecified site: Secondary | ICD-10-CM | POA: Diagnosis not present

## 2021-04-24 DIAGNOSIS — Z1331 Encounter for screening for depression: Secondary | ICD-10-CM | POA: Diagnosis not present

## 2021-04-24 DIAGNOSIS — F418 Other specified anxiety disorders: Secondary | ICD-10-CM | POA: Diagnosis not present

## 2021-04-24 DIAGNOSIS — I10 Essential (primary) hypertension: Secondary | ICD-10-CM | POA: Diagnosis not present

## 2021-04-24 DIAGNOSIS — M5030 Other cervical disc degeneration, unspecified cervical region: Secondary | ICD-10-CM | POA: Diagnosis not present

## 2021-04-24 DIAGNOSIS — J449 Chronic obstructive pulmonary disease, unspecified: Secondary | ICD-10-CM | POA: Diagnosis not present

## 2021-04-24 DIAGNOSIS — R82998 Other abnormal findings in urine: Secondary | ICD-10-CM | POA: Diagnosis not present

## 2021-04-24 DIAGNOSIS — Z23 Encounter for immunization: Secondary | ICD-10-CM | POA: Diagnosis not present

## 2021-04-24 DIAGNOSIS — E7800 Pure hypercholesterolemia, unspecified: Secondary | ICD-10-CM | POA: Diagnosis not present

## 2021-04-24 DIAGNOSIS — Z1339 Encounter for screening examination for other mental health and behavioral disorders: Secondary | ICD-10-CM | POA: Diagnosis not present

## 2021-04-24 DIAGNOSIS — D638 Anemia in other chronic diseases classified elsewhere: Secondary | ICD-10-CM | POA: Diagnosis not present

## 2021-04-24 DIAGNOSIS — Z1212 Encounter for screening for malignant neoplasm of rectum: Secondary | ICD-10-CM | POA: Diagnosis not present

## 2021-04-24 DIAGNOSIS — I700 Atherosclerosis of aorta: Secondary | ICD-10-CM | POA: Diagnosis not present

## 2021-04-24 DIAGNOSIS — Z0000 Encounter for general adult medical examination without abnormal findings: Secondary | ICD-10-CM | POA: Diagnosis not present

## 2021-04-26 ENCOUNTER — Ambulatory Visit (HOSPITAL_COMMUNITY)
Admission: RE | Admit: 2021-04-26 | Discharge: 2021-04-26 | Disposition: A | Discharge status: Home / Self Care | Payer: Medicare PPO | Source: Ambulatory Visit | Attending: Internal Medicine | Admitting: Internal Medicine

## 2021-04-26 ENCOUNTER — Other Ambulatory Visit: Payer: Self-pay

## 2021-04-26 DIAGNOSIS — Z1231 Encounter for screening mammogram for malignant neoplasm of breast: Secondary | ICD-10-CM | POA: Diagnosis not present

## 2021-05-02 ENCOUNTER — Other Ambulatory Visit (HOSPITAL_COMMUNITY): Payer: Self-pay | Admitting: Internal Medicine

## 2021-05-07 ENCOUNTER — Other Ambulatory Visit (HOSPITAL_COMMUNITY): Payer: Self-pay | Admitting: Internal Medicine

## 2021-05-07 DIAGNOSIS — R928 Other abnormal and inconclusive findings on diagnostic imaging of breast: Secondary | ICD-10-CM

## 2021-05-14 NOTE — Progress Notes (Deleted)
Assessment/Plan:    1.  Essential Tremor  -Continue primidone, 50 mg, 2 tablets in the morning, 1 tablet at night  Subjective:   [name] was seen today in follow up for essential tremor.  My previous records were reviewed prior to todays visit.  Patient remains on primidone, 100 mg in the morning and 50 mg at night.  Patient was in the emergency room and June for vertigo.  She was also in the emergency room in September, complaining about neck pain, and having stiffness and spasm when she moves the neck.  She was diagnosed with acute torticollis and was given Flexeril (although it really sounds more like muscle spasm based on the notes).  Current prescribed movement disorder medications: Primidone, 50 mg, 2 tablets in the morning, 1 at night    ALLERGIES:   Allergies  Allergen Reactions   Morphine And Related Hypertension    CURRENT MEDICATIONS:  Outpatient Encounter Medications as of 05/15/2021  Medication Sig   albuterol (VENTOLIN HFA) 108 (90 Base) MCG/ACT inhaler INHALE 1 TO 2 PUFFS BY MOUTH FOUR TIMES DAILY AS NEEDED AS DIRECTED FOR DIFFICULT BREATHING OR WHEEZING   amLODipine-valsartan (EXFORGE) 5-160 MG per tablet Take 1 tablet by mouth daily.   Biotin 1000 MCG tablet Take 1,000 mcg by mouth daily.   budesonide-formoterol (SYMBICORT) 160-4.5 MCG/ACT inhaler Inhale 2 puffs into the lungs daily as needed (asthma).   Carboxymeth-Glyc-Polysorb PF (REFRESH DIGITAL PF) 0.5-1-0.5 % SOLN Place 1 drop into both eyes 3 (three) times daily as needed (dry eyes).   Cholecalciferol (VITAMIN D) 50 MCG (2000 UT) tablet Take 2,000 Units by mouth daily.   diclofenac Sodium (VOLTAREN) 1 % GEL Apply 1 application topically 3 (three) times daily as needed (pain).   ELDERBERRY PO Liquid drink daily   escitalopram (LEXAPRO) 10 MG tablet Take 10 mg by mouth daily.   fluvastatin (LESCOL) 20 MG capsule Take 20 mg by mouth 3 (three) times a week.   furosemide (LASIX) 20 MG tablet Take 20  mg by mouth daily.   HYDROcodone-acetaminophen (NORCO/VICODIN) 5-325 MG tablet Take 1 tablet by mouth 2 (two) times daily.   hyoscyamine (LEVSIN SL) 0.125 MG SL tablet DISSOLVE ONE TAB UNDER TONGUE EVERY 4 HOURS AS NEEDED AND AT BEDTIME   Menthol, Topical Analgesic, (BIOFREEZE) 4 % GEL Apply 1 application topically daily as needed (pain).   metoprolol tartrate (LOPRESSOR) 25 MG tablet Take 25 mg by mouth daily.   Multiple Vitamins-Minerals (MULTIVITAMIN ADULT) CHEW Chew 2 each by mouth daily.   omeprazole (PRILOSEC) 20 MG capsule Take 20 mg by mouth daily as needed. For acid reflux   Plecanatide (TRULANCE) 3 MG TABS Take 3 mg by mouth daily.   primidone (MYSOLINE) 50 MG tablet TAKE 2 TABLETS BY MOUTH IN THE AM AND 1 TABLET IN THE PM   simethicone (MYLICON) 80 MG chewable tablet Chew 80 mg by mouth daily as needed (bloating).   No facility-administered encounter medications on file as of 05/15/2021.     Objective:    PHYSICAL EXAMINATION:    VITALS:   There were no vitals filed for this visit.   GEN:  The patient appears stated age and is in NAD. HEENT:  Normocephalic, atraumatic.  The mucous membranes are moist. The superficial temporal arteries are without ropiness or tenderness. CV:  RRR Lungs:  CTAB Neck/HEME:  There are no carotid bruits bilaterally.  Neurological examination:  Orientation: The patient is alert and oriented x3. Cranial nerves: There  is good facial symmetry. The speech is fluent and clear. Soft palate rises symmetrically and there is no tongue deviation. Hearing is intact to conversational tone. Sensation: Sensation is intact to light touch throughout Motor: Strength is at least antigravity x4.  Movement examination: Tone: There is normal tone in the UE/LE Abnormal movements: mild tremor on the R (postural) Coordination:  There is no decremation with RAM's  I have reviewed and interpreted the following labs independently   Chemistry      Component Value  Date/Time   NA 138 01/01/2021 1515   K 4.0 01/01/2021 1515   CL 103 01/01/2021 1515   CO2 27 01/01/2021 1515   BUN 14 01/01/2021 1515   CREATININE 0.85 01/01/2021 1515      Component Value Date/Time   CALCIUM 9.4 01/01/2021 1515   ALKPHOS 75 04/07/2010 1931   AST 20 04/07/2010 1931   ALT 15 04/07/2010 1931   BILITOT 0.6 04/07/2010 1931      Lab Results  Component Value Date   WBC 5.2 01/01/2021   HGB 12.4 01/01/2021   HCT 39.6 01/01/2021   MCV 93.6 01/01/2021   PLT 229 01/01/2021   No results found for: TSH   Chemistry      Component Value Date/Time   NA 138 01/01/2021 1515   K 4.0 01/01/2021 1515   CL 103 01/01/2021 1515   CO2 27 01/01/2021 1515   BUN 14 01/01/2021 1515   CREATININE 0.85 01/01/2021 1515      Component Value Date/Time   CALCIUM 9.4 01/01/2021 1515   ALKPHOS 75 04/07/2010 1931   AST 20 04/07/2010 1931   ALT 15 04/07/2010 1931   BILITOT 0.6 04/07/2010 1931         Total time spent on today's visit was *** minutes, including both face-to-face time and nonface-to-face time.  Time included that spent on review of records (prior notes available to me/labs/imaging if pertinent), discussing treatment and goals, answering patient's questions and coordinating care.  Cc:  Tisovec, Fransico Him, MD

## 2021-05-15 ENCOUNTER — Ambulatory Visit: Payer: Medicare PPO | Admitting: Neurology

## 2021-06-04 NOTE — Progress Notes (Signed)
Primary Care Physician: Tisovec, Fransico Him, MD  Primary Gastroenterologist:  Garfield Cornea, MD   Chief Complaint  Patient presents with   Gastroesophageal Reflux    Some better. Starts in lower abd and goes up into chest--burning.   Constipation    Trulance was too expensive. Using smooth move and prunes.   Diarrhea    HPI: [name] is a 75 y.o. female here for follow up. Last seen in 02/2021. History of GERD, IBS-C. H/o left lateral abdominal wall hernia s/p nephrectomy in remote past. Started on Amitiza but was too expensive. RX for Trulance 3mg  daily as needed for constipation but also too expensive. Previously Linzess was too strong.   Today: Several weeks ago started having burning in the upper abdomen.  Would radiate up into the chest. Started taking omeprazole on a regular basis.  Now taking about every other day.  Seems a lot better but continues to wake up in the mornings with burning in the epigastric region.  Seems as if acid is accumulating during the night.  Please try to make dietary changes, stopped coffee, avoiding spicy foods.  Has not really made much of a difference.  Previously was taken lemon/honey/vinegar for reflux stopped and it may be adding to her abdominal burning.  Continues omeprazole every other day.  Abdominal burning no longer all day but still happens in the morning.  No vomiting.  No dysphagia.  Continues to struggle with constipation.  Taking smooth move and/or prunes when needed, typically has a bowel movement after taking a couple of days.  May result in diarrhea at times.  Denies any blood in the stool or melena.  No prior upper endoscopy.   Colonoscopy April 2022: -Non-bleeding internal hemorrhoids. -Two 4 to 6 mm polyps at the hepatic flexure, removed with a cold snare. Resected and retrieved. -Diverticulosis in the entire examined colon. Normal-appearing TI -The examination was otherwise normal on direct and retroflexion  views. -Tubular adenomas noted on path -Surveillance colonoscopy can be considered in 5 years if health permits.    Current Outpatient Medications  Medication Sig Dispense Refill   albuterol (VENTOLIN HFA) 108 (90 Base) MCG/ACT inhaler INHALE 1 TO 2 PUFFS BY MOUTH FOUR TIMES DAILY AS NEEDED AS DIRECTED FOR DIFFICULT BREATHING OR WHEEZING     amLODipine-valsartan (EXFORGE) 5-160 MG per tablet Take 1 tablet by mouth daily.     Biotin 1000 MCG tablet Take 1,000 mcg by mouth daily.     budesonide-formoterol (SYMBICORT) 160-4.5 MCG/ACT inhaler Inhale 2 puffs into the lungs daily as needed (asthma).     Carboxymeth-Glyc-Polysorb PF (REFRESH DIGITAL PF) 0.5-1-0.5 % SOLN Place 1 drop into both eyes 3 (three) times daily as needed (dry eyes).     Cholecalciferol (VITAMIN D) 50 MCG (2000 UT) tablet Take 2,000 Units by mouth daily.     diclofenac Sodium (VOLTAREN) 1 % GEL Apply 1 application topically 3 (three) times daily as needed (pain).     escitalopram (LEXAPRO) 10 MG tablet Take 10 mg by mouth daily.     fluvastatin (LESCOL) 20 MG capsule Take 20 mg by mouth 3 (three) times a week.     furosemide (LASIX) 20 MG tablet Take 20 mg by mouth daily.     HYDROcodone-acetaminophen (NORCO/VICODIN) 5-325 MG tablet Take 1 tablet by mouth 2 (two) times daily.     hyoscyamine (LEVSIN SL) 0.125 MG SL tablet DISSOLVE ONE TAB UNDER TONGUE EVERY 4 HOURS AS NEEDED AND AT BEDTIME  120 tablet 3   Menthol, Topical Analgesic, (BIOFREEZE) 4 % GEL Apply 1 application topically daily as needed (pain).     metoprolol tartrate (LOPRESSOR) 25 MG tablet Take 25 mg by mouth daily.  5   Multiple Vitamins-Minerals (MULTIVITAMIN ADULT) CHEW Chew 2 each by mouth daily.     omeprazole (PRILOSEC) 20 MG capsule Take 20 mg by mouth daily as needed. For acid reflux     OVER THE COUNTER MEDICATION Uses smooth move and prunes for constipation     primidone (MYSOLINE) 50 MG tablet TAKE 2 TABLETS BY MOUTH IN THE AM AND 1 TABLET IN THE PM  270 tablet 0   simethicone (MYLICON) 80 MG chewable tablet Chew 80 mg by mouth daily as needed (bloating).     No current facility-administered medications for this visit.    Allergies as of 06/05/2021 - Review Complete 06/05/2021  Allergen Reaction Noted   Morphine and related Hypertension 11/11/2013    ROS:  General: Negative for anorexia, weight loss, fever, chills, fatigue, weakness. ENT: Negative for hoarseness, difficulty swallowing , nasal congestion. CV: Negative for chest pain, angina, palpitations, dyspnea on exertion, peripheral edema.  Respiratory: Negative for dyspnea at rest, dyspnea on exertion, cough, sputum, wheezing.  GI: See history of present illness. GU:  Negative for dysuria, hematuria, urinary incontinence, urinary frequency, nocturnal urination.  Endo: Negative for unusual weight change.    Physical Examination:   BP 128/78   Pulse 66   Temp (!) 97.1 F (36.2 C) (Temporal)   Ht 5\' 5"  (1.651 m)   Wt 167 lb 12.8 oz (76.1 kg)   BMI 27.92 kg/m   General: Well-nourished, well-developed in no acute distress.  Eyes: No icterus. Mouth: masked Abdomen: Bowel sounds are normal, nontender, nondistended, no hepatosplenomegaly or masses, no abdominal bruits, no rebound or guarding.   Extremities: No lower extremity edema. No clubbing or deformities. Neuro: Alert and oriented x 4   Skin: Warm and dry, no jaundice.   Psych: Alert and cooperative, normal mood and affect.  Labs:  Lab Results  Component Value Date   CREATININE 0.85 01/01/2021   BUN 14 01/01/2021   NA 138 01/01/2021   K 4.0 01/01/2021   CL 103 01/01/2021   CO2 27 01/01/2021   Lab Results  Component Value Date   WBC 5.2 01/01/2021   HGB 12.4 01/01/2021   HCT 39.6 01/01/2021   MCV 93.6 01/01/2021   PLT 229 01/01/2021      Imaging Studies: No results found.   Assessment:  GERD/epigastric pain: Typical reflux has been well controlled taking lemon/honey/vinegar combination.  Several  weeks ago started having epigastric pain/burning, restarted her omeprazole taking 20 mg every other day.  She has had some improvement but continues with persistent symptoms.  Denies any NSAID or aspirin use.  Symptoms may be from refractory reflux versus gastritis, dyspepsia.  We will have her take PPI on a regular basis, up to 2 daily.  If no significant improvement in symptoms would recommend upper endoscopy for further evaluation.  Constipation: Unable to afford Amitiza, Trulance.  Linzess previously too strong.  Trial of daily bowel regimen. She can start with Benefiber OR Miralax.    Plan: Start Benefiber once daily per package instructions on MiraLAX 1 capful daily.  Hold for 24 hours if develops diarrhea.  Continue as needed smooth move. Take omeprazole 20 mg before breakfast daily, may take an additional dose before evening meal if needed. Call in 2 weeks with a progress  report, if he continues to have abdominal pain, she may need upper endoscopy to further evaluate symptoms. Return for office visit in 6 months or sooner if needed.    Try miralax once daily.

## 2021-06-05 ENCOUNTER — Other Ambulatory Visit: Payer: Self-pay

## 2021-06-05 ENCOUNTER — Encounter: Payer: Self-pay | Admitting: Gastroenterology

## 2021-06-05 ENCOUNTER — Ambulatory Visit: Payer: Medicare PPO | Admitting: Gastroenterology

## 2021-06-05 VITALS — BP 128/78 | HR 66 | Temp 97.1°F | Ht 65.0 in | Wt 167.8 lb

## 2021-06-05 DIAGNOSIS — K581 Irritable bowel syndrome with constipation: Secondary | ICD-10-CM | POA: Diagnosis not present

## 2021-06-05 DIAGNOSIS — K219 Gastro-esophageal reflux disease without esophagitis: Secondary | ICD-10-CM

## 2021-06-05 DIAGNOSIS — R1013 Epigastric pain: Secondary | ICD-10-CM | POA: Insufficient documentation

## 2021-06-05 MED ORDER — OMEPRAZOLE 20 MG PO CPDR: DELAYED_RELEASE_CAPSULE | ORAL | 1 refills | Status: DC

## 2021-06-05 NOTE — Patient Instructions (Signed)
For constipation: try taking either Benefiber once daily per package instructions OR Miralax one capful daily. You can hold for 24 hours if you have diarrhea. You can still use smooth move as needed.  For acid reflux/stomach burning: take omeprazole (Prilosec) once daily before breakfast. You can take an additional dose before evening meal if needed. RX sent to your pharmacy. It may be cheaper than buying over the counter.  If you continue to have stomach burning after taking omeprazole regularly, you will need an upper endoscopy to evaluate your symptoms.  Please call in 2 weeks and let me know how you are doing.  Return for follow up in six months, BUT call sooner if needed.

## 2021-06-06 ENCOUNTER — Other Ambulatory Visit: Payer: Self-pay | Admitting: Neurology

## 2021-06-11 ENCOUNTER — Telehealth: Payer: Self-pay | Admitting: Gastroenterology

## 2021-06-11 NOTE — Telephone Encounter (Signed)
Patient called and said the omeprazole is giving her bad diarrhea and it also can cause kidney damage, patient only has one kidney

## 2021-06-11 NOTE — Telephone Encounter (Signed)
Pt states that the pain has simmered down. Pt started taking otc Prilosec before she started the omeprazole that was called in for her. Pt states that the otc Prilosec does not give her diarrhea like the RX does. Pt states that when she takes the rx it gives her bad diarrhea. Pt is wanting to continue the otc Prilosec and see if the pain continues to get better and the diarrhea goes away. Pt will call with update on pain and diarrhea and if not better she is willing to do EGD as recommended.

## 2021-06-11 NOTE — Telephone Encounter (Signed)
Please call patient and get more information regarding symptoms.   She started back on omeprazole recently due to epigastric burning radiating into her chest. PPI can cause acute interstitial nephritis and there may be a risk of chronic kidney disease, but it is not clear. We definitely would try to minimize use of PPIs (like omeprazole), and use short term only.   We can try a different PPI, but if she wants to avoid this class then would try short term famotidine 20mg  daily for 8 weeks (over the counter).  Would consider EGD if she is having ongoing epigastric pain.

## 2021-06-11 NOTE — Telephone Encounter (Signed)
That is reasonable. We will wait for further update from patient.

## 2021-06-11 NOTE — Telephone Encounter (Signed)
Pt was seen on 06/05/21. Please advise.

## 2021-06-11 NOTE — Telephone Encounter (Signed)
Noted  

## 2021-06-12 ENCOUNTER — Ambulatory Visit (HOSPITAL_COMMUNITY)
Admission: RE | Admit: 2021-06-12 | Discharge: 2021-06-12 | Disposition: A | Discharge status: Home / Self Care | Payer: Medicare PPO | Source: Ambulatory Visit | Attending: Internal Medicine | Admitting: Internal Medicine

## 2021-06-12 ENCOUNTER — Other Ambulatory Visit: Payer: Self-pay

## 2021-06-12 ENCOUNTER — Other Ambulatory Visit (HOSPITAL_COMMUNITY): Payer: Self-pay | Admitting: Internal Medicine

## 2021-06-12 DIAGNOSIS — R928 Other abnormal and inconclusive findings on diagnostic imaging of breast: Secondary | ICD-10-CM

## 2021-06-12 DIAGNOSIS — R922 Inconclusive mammogram: Secondary | ICD-10-CM | POA: Diagnosis not present

## 2021-06-14 ENCOUNTER — Ambulatory Visit: Payer: Medicare PPO | Admitting: Gastroenterology

## 2021-06-14 ENCOUNTER — Ambulatory Visit (HOSPITAL_COMMUNITY)
Admission: RE | Admit: 2021-06-14 | Discharge: 2021-06-14 | Disposition: A | Discharge status: Home / Self Care | Payer: Medicare PPO | Source: Ambulatory Visit | Attending: Internal Medicine | Admitting: Internal Medicine

## 2021-06-14 ENCOUNTER — Other Ambulatory Visit: Payer: Self-pay

## 2021-06-14 ENCOUNTER — Other Ambulatory Visit (HOSPITAL_COMMUNITY): Payer: Self-pay | Admitting: Internal Medicine

## 2021-06-14 ENCOUNTER — Encounter (HOSPITAL_COMMUNITY): Payer: Self-pay

## 2021-06-14 DIAGNOSIS — R928 Other abnormal and inconclusive findings on diagnostic imaging of breast: Secondary | ICD-10-CM

## 2021-06-14 DIAGNOSIS — N6341 Unspecified lump in right breast, subareolar: Secondary | ICD-10-CM | POA: Diagnosis not present

## 2021-06-14 MED ORDER — LIDOCAINE HCL (PF) 2 % IJ SOLN
INTRAMUSCULAR | Status: AC
  Filled 2021-06-14: qty 10

## 2021-06-14 MED ORDER — LIDOCAINE HCL (PF) 2 % IJ SOLN
INTRAMUSCULAR | Status: AC
  Administered 2021-06-14: 10 mL
  Filled 2021-06-14: qty 10

## 2021-06-14 MED ORDER — LIDOCAINE-EPINEPHRINE (PF) 2 %-1:200000 IJ SOLN
10.0000 mL | Freq: Once | INTRAMUSCULAR | Status: AC
  Administered 2021-06-14: 10 mL via INTRADERMAL
  Filled 2021-06-14: qty 10

## 2021-06-14 NOTE — Progress Notes (Signed)
PT tolerated right breast biopsy well today with NAD noted. PT verbalized understanding of discharge instructions. PT ambulated back to the mammogram area this time and given ice packs and paper copy of discharge instructions.

## 2021-06-15 LAB — SURGICAL PATHOLOGY

## 2021-06-22 NOTE — Progress Notes (Signed)
Assessment/Plan:    1.   Essential Tremor             -Continue primidone, 50 mg, 2 tablets in the morning, 1 tablet at night  2.  HA, likely tension  -doesn't want meds  -discussed hydration, proper rest, etc.  Hasn't seen son/grandson in a year due to family stress and thinks anxiety likely contributes to headache.  -states that it may be due to she wasn't taking metoprolol faithfully but is doing better now.  3.  F/u 9 months  Subjective:   [name] was seen today in follow up for essential tremor.  My previous records were reviewed prior to todays visit. Patient remains on primidone, 100 mg in the morning and 50 mg at night.  Patient was in the emergency room and June for vertigo.  She was also in the emergency room in September, complaining about neck pain, and having stiffness and spasm when she moves the neck.  She was diagnosed with acute torticollis and was given Flexeril (although it really sounds more like muscle spasm based on the notes).  Does state that separately she hit her head on a cabinet and has had some sharp pains in the head since that time.  States that she was having HA before then, around the L eye.  Headaches happening max of qoday.  She will sometimes take tylenol.  It throbs.  Rare nausea with it.  Wonders if anxiety might contribute as well.  She doesn't want med right now.     Current prescribed movement disorder medications: Primidone, 50 mg, 2 tablets in the morning, 1 at night     ALLERGIES:   Allergies  Allergen Reactions   Morphine And Related Hypertension    CURRENT MEDICATIONS:  Outpatient Encounter Medications as of 06/26/2021  Medication Sig   albuterol (VENTOLIN HFA) 108 (90 Base) MCG/ACT inhaler INHALE 1 TO 2 PUFFS BY MOUTH FOUR TIMES DAILY AS NEEDED AS DIRECTED FOR DIFFICULT BREATHING OR WHEEZING   amLODipine-valsartan (EXFORGE) 5-160 MG per tablet Take 1 tablet by mouth daily.   Biotin 1000 MCG tablet Take 1,000 mcg by mouth  daily.   budesonide-formoterol (SYMBICORT) 160-4.5 MCG/ACT inhaler Inhale 2 puffs into the lungs daily as needed (asthma).   Carboxymeth-Glyc-Polysorb PF (REFRESH DIGITAL PF) 0.5-1-0.5 % SOLN Place 1 drop into both eyes 3 (three) times daily as needed (dry eyes).   Cholecalciferol (VITAMIN D) 50 MCG (2000 UT) tablet Take 2,000 Units by mouth daily.   diclofenac Sodium (VOLTAREN) 1 % GEL Apply 1 application topically 3 (three) times daily as needed (pain).   escitalopram (LEXAPRO) 10 MG tablet Take 10 mg by mouth daily.   fluvastatin (LESCOL) 20 MG capsule Take 20 mg by mouth 3 (three) times a week.   furosemide (LASIX) 20 MG tablet Take 20 mg by mouth daily.   HYDROcodone-acetaminophen (NORCO/VICODIN) 5-325 MG tablet Take 1 tablet by mouth 2 (two) times daily.   hyoscyamine (LEVSIN SL) 0.125 MG SL tablet DISSOLVE ONE TAB UNDER TONGUE EVERY 4 HOURS AS NEEDED AND AT BEDTIME   Menthol, Topical Analgesic, (BIOFREEZE) 4 % GEL Apply 1 application topically daily as needed (pain).   metoprolol tartrate (LOPRESSOR) 25 MG tablet Take 25 mg by mouth daily.   Multiple Vitamins-Minerals (MULTIVITAMIN ADULT) CHEW Chew 2 each by mouth daily.   omeprazole (PRILOSEC) 20 MG capsule Take one capsule 30 minutes before breakfast. Take another 30 minutes before evening meal if needed.   OVER THE  COUNTER MEDICATION Uses smooth move and prunes for constipation   primidone (MYSOLINE) 50 MG tablet TAKE 2 TABLETS BY MOUTH IN THE AM AND 1 TABLET IN THE PM   simethicone (MYLICON) 80 MG chewable tablet Chew 80 mg by mouth daily as needed (bloating).   No facility-administered encounter medications on file as of 06/26/2021.     Objective:    PHYSICAL EXAMINATION:    VITALS:   Vitals:   06/26/21 1048  BP: (!) 168/99  Pulse: 65  SpO2: 99%  Weight: 166 lb 6.4 oz (75.5 kg)  Height: 5\' 7"  (1.702 m)    GEN:  The patient appears stated age and is in NAD. HEENT:  Normocephalic, atraumatic.  The mucous membranes are  moist. The superficial temporal arteries are without ropiness or tenderness.  Did not see any abnormalities on her scalp where she reported that she hit her head.  She did not report any tenderness when I was feeling her scalp (she reports that it must be better). CV:  RRR Lungs:  CTAB Neck/HEME:  There are no carotid bruits bilaterally.  Neurological examination:  Orientation: The patient is alert and oriented x3. Cranial nerves: There is good facial symmetry. The speech is fluent and clear. Soft palate rises symmetrically and there is no tongue deviation. Hearing is intact to conversational tone. Sensation: Sensation is intact to light touch throughout Motor: Strength is at least antigravity x4.  Movement examination: Tone: There is normal tone in the UE/LE Abnormal movements: No rest tremor.  Really has no postural or intention tremor, but she does have significant trouble with Archimedes spirals on her nondominant (right) hand. Coordination:  There is no decremation with RAM's Gait and Station: The patient ambulates well. I have reviewed and interpreted the following labs independently   Chemistry      Component Value Date/Time   NA 138 01/01/2021 1515   K 4.0 01/01/2021 1515   CL 103 01/01/2021 1515   CO2 27 01/01/2021 1515   BUN 14 01/01/2021 1515   CREATININE 0.85 01/01/2021 1515      Component Value Date/Time   CALCIUM 9.4 01/01/2021 1515   ALKPHOS 75 04/07/2010 1931   AST 20 04/07/2010 1931   ALT 15 04/07/2010 1931   BILITOT 0.6 04/07/2010 1931      Lab Results  Component Value Date   WBC 5.2 01/01/2021   HGB 12.4 01/01/2021   HCT 39.6 01/01/2021   MCV 93.6 01/01/2021   PLT 229 01/01/2021   No results found for: TSH   Chemistry      Component Value Date/Time   NA 138 01/01/2021 1515   K 4.0 01/01/2021 1515   CL 103 01/01/2021 1515   CO2 27 01/01/2021 1515   BUN 14 01/01/2021 1515   CREATININE 0.85 01/01/2021 1515      Component Value Date/Time    CALCIUM 9.4 01/01/2021 1515   ALKPHOS 75 04/07/2010 1931   AST 20 04/07/2010 1931   ALT 15 04/07/2010 1931   BILITOT 0.6 04/07/2010 1931         Total time spent on today's visit was 31 minutes, including both face-to-face time and nonface-to-face time.  Time included that spent on review of records (prior notes available to me/labs/imaging if pertinent), discussing treatment and goals, answering patient's questions and coordinating care.  Cc:  Tisovec, Fransico Him, MD

## 2021-06-26 ENCOUNTER — Other Ambulatory Visit: Payer: Self-pay

## 2021-06-26 ENCOUNTER — Ambulatory Visit: Payer: Medicare PPO | Admitting: Neurology

## 2021-06-26 ENCOUNTER — Encounter: Payer: Self-pay | Admitting: Neurology

## 2021-06-26 VITALS — BP 168/99 | HR 65 | Ht 67.0 in | Wt 166.4 lb

## 2021-06-26 DIAGNOSIS — G250 Essential tremor: Secondary | ICD-10-CM

## 2021-06-26 DIAGNOSIS — G44209 Tension-type headache, unspecified, not intractable: Secondary | ICD-10-CM

## 2021-06-26 NOTE — Patient Instructions (Signed)
Essential Tremor °A tremor is trembling or shaking that a person cannot control. Most tremors affect the hands or arms. Tremors can also affect the head, vocal cords, legs, and other parts of the body. Essential tremor is a tremor without a known cause. Usually, it occurs while a person is trying to perform an action. It tends to get worse gradually as a person ages. °What are the causes? °The cause of this condition is not known. °What increases the risk? °You are more likely to develop this condition if: °You have a family member with essential tremor. °You are age 40 or older. °You take certain medicines. °What are the signs or symptoms? °The main sign of a tremor is a rhythmic shaking of certain parts of your body that is uncontrolled and unintentional. You may: °Have difficulty eating with a spoon or fork. °Have difficulty writing. °Nod your head up and down or side to side. °Have a quivering voice. °The shaking may: °Get worse over time. °Come and go. °Be more noticeable on one side of your body. °Get worse due to stress, fatigue, caffeine, and extreme heat or cold. °How is this diagnosed? °This condition may be diagnosed based on: °Your symptoms and medical history. °A physical exam. °There is no single test to diagnose an essential tremor. However, your health care provider may order tests to rule out other causes of your condition. These may include: °Blood and urine tests. °Imaging studies of your brain, such as CT scan and MRI. °A test that measures involuntary muscle movement (electromyogram). °How is this treated? °Treatment for essential tremor depends on the severity of the condition. °Some tremors may go away without treatment. °Mild tremors may not need treatment if they do not affect your day-to-day life. °Severe tremors may need to be treated using one or more of the following options: °Medicines. °Lifestyle changes. °Occupational or physical therapy. °Follow these instructions at  home: °Lifestyle ° °Do not use any products that contain nicotine or tobacco, such as cigarettes and e-cigarettes. If you need help quitting, ask your health care provider. °Limit your caffeine intake as told by your health care provider. °Try to get 8 hours of sleep each night. °Find ways to manage your stress that fits your lifestyle and personality. Consider trying meditation or yoga. °Try to anticipate stressful situations and allow extra time to manage them. °If you are struggling emotionally with the effects of your tremor, consider working with a mental health provider. °General instructions °Take over-the-counter and prescription medicines only as told by your health care provider. °Avoid extreme heat and extreme cold. °Keep all follow-up visits as told by your health care provider. This is important. Visits may include physical therapy visits. °Contact a health care provider if: °You experience any changes in the location or intensity of your tremors. °You start having a tremor after starting a new medicine. °You have tremor with other symptoms, such as: °Numbness. °Tingling. °Pain. °Weakness. °Your tremor gets worse. °Your tremor interferes with your daily life. °You feel down, blue, or sad for at least 2 weeks in a row. °Worrying about your tremor and what other people think about you interferes with your everyday life functions, including relationships, work, or school. °Summary °Essential tremor is a tremor without a known cause. Usually, it occurs when you are trying to perform an action. °You are more likely to develop this condition if you have a family member with essential tremor. °The main sign of a tremor is a rhythmic shaking of   certain parts of your body that is uncontrolled and unintentional. °Treatment for essential tremor depends on the severity of the condition. °This information is not intended to replace advice given to you by your health care provider. Make sure you discuss any questions  you have with your health care provider. °Document Revised: 03/15/2020 Document Reviewed: 03/17/2020 °Elsevier Patient Education © 2022 [employer] Inc. ° °

## 2021-07-16 DIAGNOSIS — K432 Incisional hernia without obstruction or gangrene: Secondary | ICD-10-CM | POA: Diagnosis not present

## 2021-07-26 DIAGNOSIS — Z6827 Body mass index (BMI) 27.0-27.9, adult: Secondary | ICD-10-CM | POA: Diagnosis not present

## 2021-07-26 DIAGNOSIS — Z0000 Encounter for general adult medical examination without abnormal findings: Secondary | ICD-10-CM | POA: Diagnosis not present

## 2021-07-26 DIAGNOSIS — E663 Overweight: Secondary | ICD-10-CM | POA: Diagnosis not present

## 2021-07-30 ENCOUNTER — Encounter: Payer: Self-pay | Admitting: Adult Health

## 2021-07-30 ENCOUNTER — Other Ambulatory Visit: Payer: Self-pay

## 2021-07-30 ENCOUNTER — Ambulatory Visit: Payer: Medicare PPO | Admitting: Adult Health

## 2021-07-30 VITALS — BP 123/76 | HR 55 | Ht 66.0 in | Wt 168.0 lb

## 2021-07-30 DIAGNOSIS — N644 Mastodynia: Secondary | ICD-10-CM

## 2021-07-30 DIAGNOSIS — D229 Melanocytic nevi, unspecified: Secondary | ICD-10-CM

## 2021-07-30 DIAGNOSIS — N6324 Unspecified lump in the left breast, lower inner quadrant: Secondary | ICD-10-CM

## 2021-07-30 DIAGNOSIS — N898 Other specified noninflammatory disorders of vagina: Secondary | ICD-10-CM

## 2021-07-30 DIAGNOSIS — N8111 Cystocele, midline: Secondary | ICD-10-CM | POA: Diagnosis not present

## 2021-07-30 DIAGNOSIS — R1012 Left upper quadrant pain: Secondary | ICD-10-CM

## 2021-07-30 MED ORDER — METRONIDAZOLE 0.75 % VA GEL: 1.0000 | Freq: Every day | VAGINAL | 0 refills | Status: DC

## 2021-07-30 NOTE — Progress Notes (Signed)
Patient ID: [name], female   DOB: [birth year], 76 y.o.   MRN: [id number] History of Present Illness: [name] is a 76 year old black female, widowed, sp hysterectomy in complaining of vaginal discharge and odor and pain in left breast and LUQ.  PCP is Dr Osborne Casco    Current Medications, Allergies, Past Medical History, Past Surgical History, Family History and Social History were reviewed in [facility] record.     Review of Systems: Pain in left breast Pain LUQ Mole change left breast  +vaginal discharge with odor at times    Physical Exam:BP 123/76 (BP Location: Left Arm, Patient Position: Sitting, Cuff Size: Normal)    Pulse (!) 55    Ht 5\' 6"  (1.676 m)    Wt 168 lb (76.2 kg)    BMI 27.12 kg/m   General:  Well developed, well nourished, no acute distress Skin:  Warm and dry Breast:  No dominant palpable mass, retraction, or nipple discharge on the right, on the left no retraction or nipple discharge, has mass/ridge, between 6-7 o'clock that is tender and mole on left breat about 3 0'clock that is bigger and is dark, she has numerous moles and AKs on both brests.  Abdomen:  Soft, no hepatosplenomegaly,has tender area about scar LUQ Pelvic:  External genitalia is normal in appearance, no lesions.  The vagina scant discharge with slight odor, is pale with loss of rugae.+mild cystocele.  Urethra has no lesions or masses. The cervix an uterus are absent. No adnexal masses or tenderness noted.Bladder is non tender, no masses felt. Extremities/musculoskeletal:  No swelling or varicosities noted, no clubbing or cyanosis Psych:  No mood changes, alert and cooperative,seems happy Fall risk is low  [place] - 07/30/21 1117       Pregnancy Intention Screening   Does the patient want to become pregnant in the next year? N/A    Does the patient's partner want to become pregnant in the next year? N/A    Would the patient like to discuss contraceptive options today? N/A       Contraception Wrap Up   Current Method Female Sterilization   hyst   End Method Female Sterilization   hyst   Contraception Counseling Provided No            Examination chaperoned by Levy Pupa LPN   Impression and Plan: 1. Vaginal discharge Will rx Metrogel Meds ordered this encounter  Medications   metroNIDAZOLE (METROGEL VAGINAL) 0.75 % vaginal gel    Sig: Place 1 Applicatorful vaginally at bedtime.    Dispense:  70 g    Refill:  0    Order Specific Question:   Supervising Provider    Answer:   Elonda Husky, LUTHER H [[id number]]     2. Vaginal odor Rx Metrogel   3. Breast pain, left Left diagnostic mammogram scheduled for her 07/31/21 at 9:20 am at [facility] UNI LEFT; Future - US BREAST LTD UNI LEFT INC AXILLA; Future  4. Mass of lower inner quadrant of left breast Diagnostic Mammogram 07/31/21  - MM DIAG BREAST TOMO UNI LEFT; Future - US BREAST LTD UNI LEFT INC AXILLA; Future  5. Enlarged skin mole See dermatologist   6. Cystocele, midline  7. Abdominal pain, LUQ (left upper quadrant) Will get [place] for LUQ pain  - US Abdomen Complete; Future  Will talk when results back

## 2021-07-31 ENCOUNTER — Ambulatory Visit (HOSPITAL_COMMUNITY)
Admission: RE | Admit: 2021-07-31 | Discharge: 2021-07-31 | Disposition: A | Discharge status: Home / Self Care | Payer: Medicare PPO | Source: Ambulatory Visit | Attending: Adult Health | Admitting: Adult Health

## 2021-07-31 DIAGNOSIS — R922 Inconclusive mammogram: Secondary | ICD-10-CM | POA: Diagnosis not present

## 2021-07-31 DIAGNOSIS — N6324 Unspecified lump in the left breast, lower inner quadrant: Secondary | ICD-10-CM | POA: Insufficient documentation

## 2021-07-31 DIAGNOSIS — N644 Mastodynia: Secondary | ICD-10-CM | POA: Diagnosis not present

## 2021-07-31 DIAGNOSIS — R1012 Left upper quadrant pain: Secondary | ICD-10-CM | POA: Diagnosis not present

## 2021-08-02 DIAGNOSIS — M65332 Trigger finger, left middle finger: Secondary | ICD-10-CM | POA: Diagnosis not present

## 2021-08-05 DIAGNOSIS — I10 Essential (primary) hypertension: Secondary | ICD-10-CM | POA: Diagnosis not present

## 2021-08-05 DIAGNOSIS — I700 Atherosclerosis of aorta: Secondary | ICD-10-CM | POA: Diagnosis not present

## 2021-08-05 DIAGNOSIS — E7800 Pure hypercholesterolemia, unspecified: Secondary | ICD-10-CM | POA: Diagnosis not present

## 2021-08-05 DIAGNOSIS — J449 Chronic obstructive pulmonary disease, unspecified: Secondary | ICD-10-CM | POA: Diagnosis not present

## 2021-08-06 ENCOUNTER — Other Ambulatory Visit: Payer: Self-pay

## 2021-08-06 ENCOUNTER — Ambulatory Visit (HOSPITAL_COMMUNITY)
Admission: RE | Admit: 2021-08-06 | Discharge: 2021-08-06 | Disposition: A | Discharge status: Home / Self Care | Payer: Medicare PPO | Source: Ambulatory Visit | Attending: Adult Health | Admitting: Adult Health

## 2021-08-06 DIAGNOSIS — R1012 Left upper quadrant pain: Secondary | ICD-10-CM

## 2021-08-06 DIAGNOSIS — N6324 Unspecified lump in the left breast, lower inner quadrant: Secondary | ICD-10-CM | POA: Diagnosis not present

## 2021-08-06 DIAGNOSIS — N281 Cyst of kidney, acquired: Secondary | ICD-10-CM | POA: Diagnosis not present

## 2021-08-06 DIAGNOSIS — R1032 Left lower quadrant pain: Secondary | ICD-10-CM | POA: Diagnosis not present

## 2021-08-06 DIAGNOSIS — N644 Mastodynia: Secondary | ICD-10-CM | POA: Diagnosis not present

## 2021-08-06 DIAGNOSIS — Z9049 Acquired absence of other specified parts of digestive tract: Secondary | ICD-10-CM | POA: Diagnosis not present

## 2021-08-06 DIAGNOSIS — K7689 Other specified diseases of liver: Secondary | ICD-10-CM | POA: Diagnosis not present

## 2021-08-15 ENCOUNTER — Ambulatory Visit: Payer: Medicare PPO | Admitting: Gastroenterology

## 2021-08-31 ENCOUNTER — Ambulatory Visit: Payer: Medicare PPO | Admitting: Neurology

## 2021-09-01 ENCOUNTER — Other Ambulatory Visit: Payer: Self-pay | Admitting: Neurology

## 2021-09-01 DIAGNOSIS — G250 Essential tremor: Secondary | ICD-10-CM

## 2021-09-03 ENCOUNTER — Other Ambulatory Visit: Payer: Self-pay

## 2021-09-13 ENCOUNTER — Telehealth: Payer: Self-pay | Admitting: Adult Health

## 2021-09-13 NOTE — Telephone Encounter (Signed)
Patient states she's been having some cramping in her vaginal area. She states the only new thing she has been doing is using Lume around her area. Please advise. She is getting ready to go to a hair appt, so she said you could leave her a message if she doesn't answer.  ?

## 2021-09-13 NOTE — Telephone Encounter (Signed)
Returned pt's call, two identifiers used. Pt stated that she was experiencing the vaginal cramping intermittently. Pt was advised to stop using the Lume and see if there was improvement. If not, she was directed to call our office and make an appt to be seen. Pt confirmed understanding. ?

## 2021-09-19 DIAGNOSIS — L821 Other seborrheic keratosis: Secondary | ICD-10-CM | POA: Diagnosis not present

## 2021-09-19 DIAGNOSIS — L308 Other specified dermatitis: Secondary | ICD-10-CM | POA: Diagnosis not present

## 2021-09-19 DIAGNOSIS — L820 Inflamed seborrheic keratosis: Secondary | ICD-10-CM | POA: Diagnosis not present

## 2021-10-03 DIAGNOSIS — M9905 Segmental and somatic dysfunction of pelvic region: Secondary | ICD-10-CM | POA: Diagnosis not present

## 2021-10-03 DIAGNOSIS — M9903 Segmental and somatic dysfunction of lumbar region: Secondary | ICD-10-CM | POA: Diagnosis not present

## 2021-10-03 DIAGNOSIS — M9902 Segmental and somatic dysfunction of thoracic region: Secondary | ICD-10-CM | POA: Diagnosis not present

## 2021-10-03 DIAGNOSIS — M5441 Lumbago with sciatica, right side: Secondary | ICD-10-CM | POA: Diagnosis not present

## 2021-10-03 DIAGNOSIS — M546 Pain in thoracic spine: Secondary | ICD-10-CM | POA: Diagnosis not present

## 2021-10-08 DIAGNOSIS — M9905 Segmental and somatic dysfunction of pelvic region: Secondary | ICD-10-CM | POA: Diagnosis not present

## 2021-10-08 DIAGNOSIS — M9903 Segmental and somatic dysfunction of lumbar region: Secondary | ICD-10-CM | POA: Diagnosis not present

## 2021-10-08 DIAGNOSIS — M546 Pain in thoracic spine: Secondary | ICD-10-CM | POA: Diagnosis not present

## 2021-10-08 DIAGNOSIS — M9902 Segmental and somatic dysfunction of thoracic region: Secondary | ICD-10-CM | POA: Diagnosis not present

## 2021-10-08 DIAGNOSIS — M5441 Lumbago with sciatica, right side: Secondary | ICD-10-CM | POA: Diagnosis not present

## 2021-10-16 DIAGNOSIS — H04123 Dry eye syndrome of bilateral lacrimal glands: Secondary | ICD-10-CM | POA: Diagnosis not present

## 2021-10-24 DIAGNOSIS — J449 Chronic obstructive pulmonary disease, unspecified: Secondary | ICD-10-CM | POA: Diagnosis not present

## 2021-10-24 DIAGNOSIS — G8929 Other chronic pain: Secondary | ICD-10-CM | POA: Diagnosis not present

## 2021-10-24 DIAGNOSIS — Z1339 Encounter for screening examination for other mental health and behavioral disorders: Secondary | ICD-10-CM | POA: Diagnosis not present

## 2021-10-24 DIAGNOSIS — M5030 Other cervical disc degeneration, unspecified cervical region: Secondary | ICD-10-CM | POA: Diagnosis not present

## 2021-10-24 DIAGNOSIS — M791 Myalgia, unspecified site: Secondary | ICD-10-CM | POA: Diagnosis not present

## 2021-10-24 DIAGNOSIS — F418 Other specified anxiety disorders: Secondary | ICD-10-CM | POA: Diagnosis not present

## 2021-10-24 DIAGNOSIS — D638 Anemia in other chronic diseases classified elsewhere: Secondary | ICD-10-CM | POA: Diagnosis not present

## 2021-10-24 DIAGNOSIS — I700 Atherosclerosis of aorta: Secondary | ICD-10-CM | POA: Diagnosis not present

## 2021-10-24 DIAGNOSIS — I10 Essential (primary) hypertension: Secondary | ICD-10-CM | POA: Diagnosis not present

## 2021-10-24 DIAGNOSIS — Z1331 Encounter for screening for depression: Secondary | ICD-10-CM | POA: Diagnosis not present

## 2021-10-24 DIAGNOSIS — E7800 Pure hypercholesterolemia, unspecified: Secondary | ICD-10-CM | POA: Diagnosis not present

## 2021-11-15 DIAGNOSIS — M79645 Pain in left finger(s): Secondary | ICD-10-CM | POA: Diagnosis not present

## 2021-11-19 ENCOUNTER — Encounter: Payer: Self-pay | Admitting: Gastroenterology

## 2021-11-19 ENCOUNTER — Ambulatory Visit: Payer: Medicare HMO | Admitting: Gastroenterology

## 2021-11-19 VITALS — BP 130/68 | HR 66 | Temp 97.3°F | Ht 67.0 in | Wt 168.2 lb

## 2021-11-19 DIAGNOSIS — K5900 Constipation, unspecified: Secondary | ICD-10-CM

## 2021-11-19 DIAGNOSIS — K219 Gastro-esophageal reflux disease without esophagitis: Secondary | ICD-10-CM

## 2021-11-19 NOTE — Patient Instructions (Addendum)
Continue omeprazole daily as needed for reflux. ?Continue current bowel regimen for constipation.  ?Please let me know if you note more frequent episodes of left upper abdominal pain especially if it occurs without known triggers (like increased physical activity). ?If you start having increased issues with hemorrhoids, you would be a good candidate for hemorrhoid banding.  ?Return office visit in one year or call sooner if you have any questions or concerns.  ?

## 2021-11-19 NOTE — Progress Notes (Signed)
? ? ? ?GI Office Note   ? ?Referring Provider: Haywood Pao, MD ?Primary Care Physician:  Haywood Pao, MD  ?Primary Gastroenterologist: Garfield Cornea, MD ? ? ?Chief Complaint  ? ?Chief Complaint  ?Patient presents with  ? Follow-up  ?  Diarrhea has cleared up.   ? ? ?History of Present Illness  ? ?[name] is a 76 y.o. female presenting today for follow-up.  Patient was last seen in November 2022.  She has a history of GERD, IBS-C.  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 ? ?Prior to her last office visit she had several week history of upper abdominal burning.  She typically had controlled her reflux symptoms with lemon/honey/vinegar combination.  But when she developed abdominal burning, she started over-the-counter omeprazole which did seem to help. We switched her to prescription omeprazole but she developed diarrhea.  She decided to continue OTC.  She was using smooth move and prunes when needed for constipation. ? ?Today: Patient figured out that the non-dairy creamer she was using in her coffee had some milk based ingredients listed on the container. She stopped using this and start Ch[employer]ani coffee creamer (plant based) and her diarrhea resolved. She is now back to baseline constipation. She continues to manage her constipation with prunes and/or smooth move. Typically has BM daily with bowel regimen. Stools are soft. Sometimes hard to clean up after. H/o internal hemorrhoids. Denies hemorrhoid pain, itching, bleeding. No melena, brbpr. She has had some left sided abdominal pain around her nephrectomy scar, notes after doing a lot of house work. Aggravates the area and takes a day to get better. Rarely has heartburn. Take RX omeprazole '20mg'$  daily only as needed. No dysphagia.  ?  ?No prior upper endoscopy. ?  ?Colonoscopy April 2022: ?-Non-bleeding internal hemorrhoids. ?-Two 4 to 6  mm polyps at the hepatic flexure, removed with a cold snare. Resected and ?retrieved. ?-Diverticulosis in the entire examined colon. Normal-appearing TI ?-The examination was otherwise normal on direct and retroflexion views. ?-Tubular adenomas noted on path ?-Surveillance colonoscopy can be considered in 5 years if health permits. ? ?Medications  ? ?Current Outpatient Medications  ?Medication Sig Dispense Refill  ? albuterol (VENTOLIN HFA) 108 (90 Base) MCG/ACT inhaler INHALE 1 TO 2 PUFFS BY MOUTH FOUR TIMES DAILY AS NEEDED AS DIRECTED FOR DIFFICULT BREATHING OR WHEEZING    ? amLODipine-valsartan (EXFORGE) 5-160 MG per tablet Take 1 tablet by mouth daily.    ? Biotin 1000 MCG tablet Take 1,000 mcg by mouth daily.    ? Carboxymeth-Glyc-Polysorb PF (REFRESH DIGITAL PF) 0.5-1-0.5 % SOLN Place 1 drop into both eyes 3 (three) times daily as needed (dry eyes).    ? Cholecalciferol (VITAMIN D) 50 MCG (2000 UT) tablet Take 2,000 Units by mouth daily.    ? co-enzyme Q-10 30 MG capsule Take 30 mg by mouth 3 (three) times daily.    ? escitalopram (LEXAPRO) 10 MG tablet Take 10 mg by mouth daily.    ? fluvastatin (LESCOL) 20 MG capsule Take 20 mg by mouth 3 (three) times a week.    ? furosemide (LASIX) 20 MG tablet Take 20 mg by mouth daily.    ? HYDROcodone-acetaminophen (NORCO/VICODIN) 5-325 MG tablet Take 1 tablet by mouth 2 (two) times daily.    ? Menthol, Topical Analgesic, (BIOFREEZE) 4 % GEL Apply 1 application topically daily as needed (  pain).    ? metoprolol tartrate (LOPRESSOR) 25 MG tablet Take 25 mg by mouth daily.  5  ? Multiple Vitamins-Minerals (MULTIVITAMIN ADULT) CHEW Chew 2 each by mouth daily.    ? omeprazole (PRILOSEC) 20 MG capsule Take one capsule 30 minutes before breakfast. Take another 30 minutes before evening meal if needed. 180 capsule 1  ? OVER THE COUNTER MEDICATION Uses smooth move and prunes for constipation    ? primidone (MYSOLINE) 50 MG tablet TAKE 2 TABLETS BY MOUTH IN THE MORNING AND 1  TABLET IN THE EVENING 270 tablet 0  ? simethicone (MYLICON) 80 MG chewable tablet Chew 80 mg by mouth daily as needed (bloating).    ? ?No current facility-administered medications for this visit.  ? ? ?Allergies  ? ?Allergies as of 11/19/2021 - Review Complete 11/19/2021  ?Allergen Reaction Noted  ? Morphine and related Hypertension 11/11/2013  ? ? ?Review of Systems  ? ?General: Negative for anorexia, weight loss, fever, chills, fatigue, weakness. ?ENT: Negative for hoarseness, difficulty swallowing , nasal congestion. ?CV: Negative for chest pain, angina, palpitations, dyspnea on exertion, peripheral edema.  ?Respiratory: Negative for dyspnea at rest, dyspnea on exertion, cough, sputum, wheezing.  ?GI: See history of present illness. ?GU:  Negative for dysuria, hematuria, urinary incontinence, urinary frequency, nocturnal urination.  ?Endo: Negative for unusual weight change.  ?   ?Physical Exam  ? ?BP 130/68 (BP Location: Right Arm, Patient Position: Sitting, Cuff Size: Normal)   Pulse 66   Temp (!) 97.3 ?F (36.3 ?C) (Temporal)   Ht '5\' 7"'$  (1.702 m)   Wt 168 lb 3.2 oz (76.3 kg)   SpO2 97%   BMI 26.34 kg/m?  ?  ?General: Well-nourished, well-developed in no acute distress.  ?Eyes: No icterus. ?Mouth: Oropharyngeal mucosa moist and pink , no lesions erythema or exudate. ?Lungs: Clear to auscultation bilaterally.  ?Heart: Regular rate and rhythm, no murmurs rubs or gallops.  ?Abdomen: Bowel sounds are normal, nontender, nondistended, no hepatosplenomegaly or masses,  ?no abdominal bruits, no rebound or guarding. Left upper quadrant bulging compared to right. Especially noted over nephrectomy scar. Nothing reducible. Nontender. ?Rectal: not performed  ?Extremities: No lower extremity edema. No clubbing or deformities. ?Neuro: Alert and oriented x 4   ?Skin: Warm and dry, no jaundice.   ?Psych: Alert and cooperative, normal mood and affect. ? ?Labs  ? ?Lab Results  ?Component Value Date  ? CREATININE 0.85  01/01/2021  ? BUN 14 01/01/2021  ? NA 138 01/01/2021  ? K 4.0 01/01/2021  ? CL 103 01/01/2021  ? CO2 27 01/01/2021  ? ?Lab Results  ?Component Value Date  ? WBC 5.2 01/01/2021  ? HGB 12.4 01/01/2021  ? HCT 39.6 01/01/2021  ? MCV 93.6 01/01/2021  ? PLT 229 01/01/2021  ? ? ?No results found for: HGBA1C ? ?Imaging Studies  ? ?No results found. ? ?Assessment  ? ?GERD: doing well. Using PPI only when needed. No longer having abdominal burning.  ? ?Constipation: uses prunes/smooth move as needed. Colonoscopy up to date, 10/2020. Overall having daily stools. Recent diarrhea felt to be related to coffee creamer, has stopped since switching to plant based creamer. Continue to monitor symptoms.  ? ?Left abdominal wall hernia: notes increased pain with doing significant house work. Was seen at [place] 07/2021, did not advise repair at that time. Felt to be unchanged in size and symptoms possible due to other cause such as possible nerve root compression, [facility] advised her to follow up with  othopedic spine.  ? ?Hemorrhoids: known internal hemorrhoids. Became symptomatic during episodes of diarrhea but doing better now. We discussed options of hemorrhoid banding if she desired in future.  ? ?PLAN  ? ?Ov in one year.  ?Continue omeprazole daily as needed. ?Continue current bowel regimen. ?Monitor for worsening left upper quadrant pain.  ? ? ? ?Laureen Ochs. Yena Tisby, MHS, PA-C ?[facility] ? ?

## 2021-11-29 ENCOUNTER — Other Ambulatory Visit: Payer: Self-pay | Admitting: Neurology

## 2021-11-29 DIAGNOSIS — G250 Essential tremor: Secondary | ICD-10-CM

## 2021-12-07 ENCOUNTER — Other Ambulatory Visit: Payer: Self-pay | Admitting: Neurology

## 2021-12-07 DIAGNOSIS — G250 Essential tremor: Secondary | ICD-10-CM

## 2022-01-21 DIAGNOSIS — L720 Epidermal cyst: Secondary | ICD-10-CM | POA: Diagnosis not present

## 2022-01-21 DIAGNOSIS — B078 Other viral warts: Secondary | ICD-10-CM | POA: Diagnosis not present

## 2022-02-11 DIAGNOSIS — I10 Essential (primary) hypertension: Secondary | ICD-10-CM | POA: Diagnosis not present

## 2022-02-11 DIAGNOSIS — M5030 Other cervical disc degeneration, unspecified cervical region: Secondary | ICD-10-CM | POA: Diagnosis not present

## 2022-02-11 DIAGNOSIS — J449 Chronic obstructive pulmonary disease, unspecified: Secondary | ICD-10-CM | POA: Diagnosis not present

## 2022-02-11 DIAGNOSIS — G4486 Cervicogenic headache: Secondary | ICD-10-CM | POA: Diagnosis not present

## 2022-02-19 DIAGNOSIS — M65332 Trigger finger, left middle finger: Secondary | ICD-10-CM | POA: Diagnosis not present

## 2022-02-20 DIAGNOSIS — L720 Epidermal cyst: Secondary | ICD-10-CM | POA: Diagnosis not present

## 2022-03-01 DIAGNOSIS — M65332 Trigger finger, left middle finger: Secondary | ICD-10-CM | POA: Diagnosis not present

## 2022-03-14 DIAGNOSIS — L720 Epidermal cyst: Secondary | ICD-10-CM | POA: Diagnosis not present

## 2022-03-14 DIAGNOSIS — L650 Telogen effluvium: Secondary | ICD-10-CM | POA: Diagnosis not present

## 2022-03-19 ENCOUNTER — Other Ambulatory Visit: Payer: Self-pay | Admitting: Neurology

## 2022-03-19 DIAGNOSIS — G250 Essential tremor: Secondary | ICD-10-CM

## 2022-03-27 ENCOUNTER — Encounter: Payer: Self-pay | Admitting: Neurology

## 2022-03-27 ENCOUNTER — Ambulatory Visit: Payer: Medicare HMO | Admitting: Neurology

## 2022-03-27 ENCOUNTER — Telehealth: Payer: Self-pay | Admitting: Neurology

## 2022-03-27 VITALS — BP 145/90 | HR 62 | Ht 66.0 in | Wt 164.8 lb

## 2022-03-27 DIAGNOSIS — F411 Generalized anxiety disorder: Secondary | ICD-10-CM | POA: Diagnosis not present

## 2022-03-27 DIAGNOSIS — G250 Essential tremor: Secondary | ICD-10-CM | POA: Diagnosis not present

## 2022-03-27 MED ORDER — PRIMIDONE 50 MG PO TABS: ORAL_TABLET | ORAL | 1 refills | Status: DC

## 2022-03-27 NOTE — Progress Notes (Signed)
Assessment/Plan:    1.   Essential Tremor             -Increase primidone, 50 mg, 3 tablets in the morning, 1 tablet at night  2.  GAD  -is driving HA and tremor.  Doesn't want separate med for HA  -discussed counseling but wants to hold  3.  HTN  -f/u pcp  -if sbp>200 again, go to ed   Subjective:   [name] was seen today in follow up for essential tremor.  My previous records were reviewed prior to todays visit. Patient remains on primidone, 100 mg in the morning and 50 mg at night.  reports tremor is about the same - maybe a little worse.     She continues to have stress/anxiety and states that it is driving headache/tremor.  Her BP has been elevated.  States today is a good day for BP and it was up today in here.  She thinks it is all stress related.  Called pcp as had SBP>200 but didn't get call back.  bP better today   Current prescribed movement disorder medications: Primidone, 50 mg, 2 tablets in the morning, 1 at night     ALLERGIES:   Allergies  Allergen Reactions   Morphine And Related Hypertension    CURRENT MEDICATIONS:  Outpatient Encounter Medications as of 03/27/2022  Medication Sig   albuterol (VENTOLIN HFA) 108 (90 Base) MCG/ACT inhaler INHALE 1 TO 2 PUFFS BY MOUTH FOUR TIMES DAILY AS NEEDED AS DIRECTED FOR DIFFICULT BREATHING OR WHEEZING   amLODipine-valsartan (EXFORGE) 5-160 MG per tablet Take 1 tablet by mouth daily.   Biotin 1000 MCG tablet Take 1,000 mcg by mouth daily.   Carboxymeth-Glyc-Polysorb PF (REFRESH DIGITAL PF) 0.5-1-0.5 % SOLN Place 1 drop into both eyes 3 (three) times daily as needed (dry eyes).   Cholecalciferol (VITAMIN D) 50 MCG (2000 UT) tablet Take 2,000 Units by mouth daily.   co-enzyme Q-10 30 MG capsule Take 30 mg by mouth 3 (three) times daily.   escitalopram (LEXAPRO) 10 MG tablet Take 10 mg by mouth daily.   fluvastatin (LESCOL) 20 MG capsule Take 20 mg by mouth 3 (three) times a week.   furosemide (LASIX) 20 MG  tablet Take 20 mg by mouth daily.   HYDROcodone-acetaminophen (NORCO/VICODIN) 5-325 MG tablet Take 1 tablet by mouth 2 (two) times daily.   Menthol, Topical Analgesic, (BIOFREEZE) 4 % GEL Apply 1 application topically daily as needed (pain).   metoprolol tartrate (LOPRESSOR) 25 MG tablet Take 25 mg by mouth daily.   Multiple Vitamins-Minerals (MULTIVITAMIN ADULT) CHEW Chew 2 each by mouth daily.   omeprazole (PRILOSEC) 20 MG capsule Take one capsule 30 minutes before breakfast. Take another 30 minutes before evening meal if needed.   OVER THE COUNTER MEDICATION Uses smooth move and prunes for constipation   primidone (MYSOLINE) 50 MG tablet TAKE 2 TABLETS BY MOUTH IN THE MORNING AND 1 TABLET IN THE EVENING   simethicone (MYLICON) 80 MG chewable tablet Chew 80 mg by mouth daily as needed (bloating).   No facility-administered encounter medications on file as of 03/27/2022.     Objective:    PHYSICAL EXAMINATION:    VITALS:   Vitals:   03/27/22 1125  BP: (!) 145/90  Pulse: 62  SpO2: 97%  Weight: 164 lb 12.8 oz (74.8 kg)  Height: '5\' 6"'$  (1.676 m)     GEN:  The patient appears stated age and is in NAD. HEENT:  Normocephalic, atraumatic.  The mucous membranes are moist. The superficial temporal arteries are without ropiness or tenderness.   CV:  RRR Lungs:  CTAB Neck/HEME:  There are no carotid bruits bilaterally.  Neurological examination:  Orientation: The patient is alert and oriented x3. Cranial nerves: There is good facial symmetry. The speech is fluent and clear. Soft palate rises symmetrically and there is no tongue deviation. Hearing is intact to conversational tone. Sensation: Sensation is intact to light touch throughout Motor: Strength is at least antigravity x4.  Movement examination: Tone: There is normal tone in the UE/LE Abnormal movements: No rest tremor.  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  This is similar to prior visits Coordination:  There is no decremation with RAM's Gait and Station: The patient ambulates well. I have reviewed and interpreted the following labs independently   Chemistry      Component Value Date/Time   NA 138 01/01/2021 1515   K 4.0 01/01/2021 1515   CL 103 01/01/2021 1515   CO2 27 01/01/2021 1515   BUN 14 01/01/2021 1515   CREATININE 0.85 01/01/2021 1515      Component Value Date/Time   CALCIUM 9.4 01/01/2021 1515   ALKPHOS 75 04/07/2010 1931   AST 20 04/07/2010 1931   ALT 15 04/07/2010 1931   BILITOT 0.6 04/07/2010 1931      Lab Results  Component Value Date   WBC 5.2 01/01/2021   HGB 12.4 01/01/2021   HCT 39.6 01/01/2021   MCV 93.6 01/01/2021   PLT 229 01/01/2021   No results found for: "TSH"   Chemistry      Component Value Date/Time   NA 138 01/01/2021 1515   K 4.0 01/01/2021 1515   CL 103 01/01/2021 1515   CO2 27 01/01/2021 1515   BUN 14 01/01/2021 1515   CREATININE 0.85 01/01/2021 1515      Component Value Date/Time   CALCIUM 9.4 01/01/2021 1515   ALKPHOS 75 04/07/2010 1931   AST 20 04/07/2010 1931   ALT 15 04/07/2010 1931   BILITOT 0.6 04/07/2010 1931         Total time spent on today's visit was 25 minutes, including both face-to-face time and nonface-to-face time.  Time included that spent on review of records (prior notes available to me/labs/imaging if pertinent), discussing treatment and goals, answering patient's questions and coordinating care.  Cc:  Tisovec, Fransico Him, MD

## 2022-03-27 NOTE — Patient Instructions (Signed)
Take primidone, 50 mg, 3 in the AM, 1 in the evening  The physicians and staff at [facility] are committed to providing excellent care. You may receive a survey requesting feedback about your experience at our office. We strive to receive "very good" responses to the survey questions. If you feel that your experience would prevent you from giving the office a "very good " response, please contact our office to try to remedy the situation. We may be reached at [phone]. Thank you for taking the time out of your busy day to complete the survey.

## 2022-03-27 NOTE — Telephone Encounter (Signed)
Patient wants to let Dr. Carles Collet know she is wanting to keep her future Rx's at 30 days at a time, not 72, due to cost.   [facility] [place]

## 2022-03-30 ENCOUNTER — Emergency Department (HOSPITAL_COMMUNITY): Payer: Medicare HMO

## 2022-03-30 ENCOUNTER — Encounter (HOSPITAL_COMMUNITY): Payer: Self-pay | Admitting: Emergency Medicine

## 2022-03-30 ENCOUNTER — Other Ambulatory Visit: Payer: Self-pay

## 2022-03-30 ENCOUNTER — Emergency Department (HOSPITAL_COMMUNITY)
Admission: EM | Admit: 2022-03-30 | Discharge: 2022-03-30 | Disposition: A | Discharge status: Home / Self Care | Payer: Medicare HMO | Attending: Emergency Medicine | Admitting: Emergency Medicine

## 2022-03-30 DIAGNOSIS — R519 Headache, unspecified: Secondary | ICD-10-CM

## 2022-03-30 DIAGNOSIS — I10 Essential (primary) hypertension: Secondary | ICD-10-CM | POA: Diagnosis not present

## 2022-03-30 DIAGNOSIS — Z79899 Other long term (current) drug therapy: Secondary | ICD-10-CM | POA: Diagnosis not present

## 2022-03-30 DIAGNOSIS — R1012 Left upper quadrant pain: Secondary | ICD-10-CM | POA: Diagnosis not present

## 2022-03-30 DIAGNOSIS — I700 Atherosclerosis of aorta: Secondary | ICD-10-CM | POA: Diagnosis not present

## 2022-03-30 DIAGNOSIS — R079 Chest pain, unspecified: Secondary | ICD-10-CM | POA: Diagnosis not present

## 2022-03-30 DIAGNOSIS — F419 Anxiety disorder, unspecified: Secondary | ICD-10-CM | POA: Diagnosis not present

## 2022-03-30 LAB — BASIC METABOLIC PANEL
Anion gap: 7 (ref 5–15)
BUN: 10 mg/dL (ref 8–23)
CO2: 27 mmol/L (ref 22–32)
Calcium: 9.3 mg/dL (ref 8.9–10.3)
Chloride: 105 mmol/L (ref 98–111)
Creatinine, Ser: 0.9 mg/dL (ref 0.44–1.00)
GFR, Estimated: >60 mL/min (ref >60)
Glucose, Bld: 83 mg/dL (ref 70–99)
Potassium: 3.7 mmol/L (ref 3.5–5.1)
Sodium: 139 mmol/L (ref 135–145)

## 2022-03-30 LAB — TROPONIN I (HIGH SENSITIVITY)
Troponin I (High Sensitivity): 3 ng/L (ref <18)
Troponin I (High Sensitivity): 3 ng/L (ref <18)

## 2022-03-30 LAB — CBC
HCT: 39.3 % (ref 36.0–46.0)
Hemoglobin: 12.7 g/dL (ref 12.0–15.0)
MCH: 29.2 pg (ref 26.0–34.0)
MCHC: 32.3 g/dL (ref 30.0–36.0)
MCV: 90.3 fL (ref 80.0–100.0)
Platelets: 254 10*3/uL (ref 150–400)
RBC: 4.35 MIL/uL (ref 3.87–5.11)
RDW: 12.9 % (ref 11.5–15.5)
WBC: 4.5 10*3/uL (ref 4.0–10.5)
nRBC: 0 % (ref 0.0–0.2)

## 2022-03-30 LAB — SEDIMENTATION RATE: Sed Rate: 20 mm/hr (ref 0–22)

## 2022-03-30 MED ORDER — KETOROLAC TROMETHAMINE 15 MG/ML IJ SOLN
10.0000 mg | Freq: Once | INTRAMUSCULAR | Status: AC
  Administered 2022-03-30: 10 mg via INTRAVENOUS
  Filled 2022-03-30: qty 1

## 2022-03-30 MED ORDER — PROCHLORPERAZINE EDISYLATE 10 MG/2ML IJ SOLN
5.0000 mg | Freq: Once | INTRAMUSCULAR | Status: AC
  Administered 2022-03-30: 5 mg via INTRAVENOUS
  Filled 2022-03-30: qty 2

## 2022-03-30 NOTE — ED Triage Notes (Signed)
Patient c/o hypertension. Patient recently had blood pressure medication added and is currently keeping a record. Patient states she started having a headache last night and checked blood pressure in which it was 192/100. Per patient did have some numbness to left side of face. Patient reports taking some tylenol and going to bed. Patient woke this morning with a "slight nagging headache." Denies left facial numbness. Denies any neurological deficits. Per patient blood pressure this morning was 193/93. Patient reports taking blood pressure medication this morning. Patient does state that she has not taken her Lasix this morning. Current blood pressure in triage 164/103.

## 2022-03-30 NOTE — ED Provider Notes (Signed)
[facility] EMERGENCY DEPARTMENT Provider Note   CSN: [id number] Arrival date & time: 03/30/22  1033     History {Add pertinent medical, surgical, social history, OB history to HPI:1} Chief Complaint  Patient presents with   Hypertension    [name] is a 76 y.o. female.   Hypertension  Patient presents with hypertension headache and feeling bad.  States headaches been there for a week.  On the left side of her head.  Comes and goes.  No numbness or weakness.  States blood pressure has been elevated at home.  States she has texted her doctor but has not heard back.  Has a tremor in left arm which she is seen neurology before.  Reviewed note and and it appears that the headache was thought to be secondary to some anxiety.  No numbness or weakness.  No chest pain.  Has had a previous hernia on the left abdomen and states that has been bothering her.  States she gets pain in her back when she stands up.  Not exertional     Home Medications Prior to Admission medications   Medication Sig Start Date End Date Taking? Authorizing Provider  albuterol (VENTOLIN HFA) 108 (90 Base) MCG/ACT inhaler INHALE 1 TO 2 PUFFS BY MOUTH FOUR TIMES DAILY AS NEEDED AS DIRECTED FOR DIFFICULT BREATHING OR WHEEZING 05/29/20   [provider]  amLODipine-valsartan (EXFORGE) 5-160 MG per tablet Take 1 tablet by mouth daily.    [provider]  Biotin 1000 MCG tablet Take 1,000 mcg by mouth daily.    [provider]  Carboxymeth-Glyc-Polysorb PF (REFRESH DIGITAL PF) 0.5-1-0.5 % SOLN Place 1 drop into both eyes 3 (three) times daily as needed (dry eyes).    [provider]  Cholecalciferol (VITAMIN D) 50 MCG (2000 UT) tablet Take 2,000 Units by mouth daily.    [provider]  co-enzyme Q-10 30 MG capsule Take 30 mg by mouth 3 (three) times daily.    [provider]  escitalopram (LEXAPRO) 10 MG tablet Take 10 mg by mouth daily.    [provider]  fluvastatin (LESCOL) 20 MG capsule Take 20 mg by mouth 3 (three) times a week.    [provider]  furosemide (LASIX) 20 MG tablet Take 20 mg by mouth daily. 11/13/10   [provider]  HYDROcodone-acetaminophen (NORCO/VICODIN) 5-325 MG tablet Take 1 tablet by mouth 2 (two) times daily. 04/06/16   [provider]  Menthol, Topical Analgesic, (BIOFREEZE) 4 % GEL Apply 1 application topically daily as needed (pain).    [provider]  metoprolol tartrate (LOPRESSOR) 25 MG tablet Take 25 mg by mouth daily. 07/29/17   [provider]  Multiple Vitamins-Minerals (MULTIVITAMIN ADULT) CHEW Chew 2 each by mouth daily.    [provider]  omeprazole (PRILOSEC) 20 MG capsule Take one capsule 30 minutes before breakfast. Take another 30 minutes before evening meal if needed. 06/05/21   Mahala Menghini, PA-C  OVER THE COUNTER MEDICATION Uses smooth move and prunes for constipation    [provider]  primidone (MYSOLINE) 50 MG tablet 3 in the AM, 1 in the PM 03/27/22   Tat, Rebecca S, DO  simethicone (MYLICON) 80 MG chewable tablet Chew 80 mg by mouth daily as needed (bloating).    [provider]      Allergies    Morphine and related    Review of Systems   Review of Systems  Physical Exam Updated  Vital Signs BP (!) 164/103 (BP Location: Left Arm)   Pulse 61   Temp 98.3 F (36.8 C) (Oral)   Ht '5\' 7"'$  (1.702 m)   Wt 73.9 kg   SpO2 96%   BMI 25.53 kg/m  Physical Exam Vitals and nursing note reviewed.  HENT:     Head: Atraumatic.     Comments: No tenderness to left temple. Cardiovascular:     Rate and Rhythm: Regular rhythm.  Pulmonary:     Breath sounds: No wheezing or rhonchi.  Abdominal:     Tenderness: There is abdominal tenderness.     Comments: Mild left upper quadrant tenderness without rebound or guarding.    Musculoskeletal:        General: No tenderness.     Cervical back: Neck supple.  Skin:    General:  Skin is warm.     Capillary Refill: Capillary refill takes less than 2 seconds.  Neurological:     Mental Status: She is alert and oriented to person, place, and time.     ED Results / Procedures / Treatments   Labs (all labs ordered are listed, but only abnormal results are displayed) Labs Reviewed  SEDIMENTATION RATE  BASIC METABOLIC PANEL  CBC  TROPONIN I (HIGH SENSITIVITY)    EKG None  Radiology DG Chest Portable 1 View  Result Date: 03/30/2022 CLINICAL DATA:  76 year old female with history of chest pain. EXAM: PORTABLE CHEST 1 VIEW COMPARISON:  Chest x-ray 12/29/2020. FINDINGS: Lung volumes are normal. No consolidative airspace disease. No pleural effusions. No pneumothorax. No pulmonary nodule or mass noted. Pulmonary vasculature and the cardiomediastinal silhouette are within normal limits. Atherosclerosis in the thoracic aorta. IMPRESSION: 1.  No radiographic evidence of acute cardiopulmonary disease. 2. Aortic atherosclerosis. Electronically Signed   By: Vinnie Langton M.D.   On: 03/30/2022 11:39    Procedures Procedures  {Document cardiac monitor, telemetry assessment procedure when appropriate:1}  Medications Ordered in ED Medications - No data to display  ED Course/ Medical Decision Making/ A&P                           Medical Decision Making Amount and/or Complexity of Data Reviewed Labs: ordered. Radiology: ordered.   Patient with headache and hypertension.  Dull.  Has been on and off the last week.  Also hypertension.  On antihypertensive medicines.  Also does have history of anxiety.  No fevers.  Will get head CT due to persistent and somewhat different headache.  EKG done and reassuring.  Reviewed recent neurology note.  Doubt stroke.  Patient does appear to have some anxiety with this 2.  We will get basic blood work to evaluate for hypertension.  Also get sed rate to evaluate for other causes of left-sided headache such as t giant cell  arteritis.  {Document critical care time when appropriate:1} {Document review of labs and clinical decision tools ie heart score, Chads2Vasc2 etc:1}  {Document your independent review of radiology images, and any outside records:1} {Document your discussion with family members, caretakers, and with consultants:1} {Document social determinants of health affecting pt's care:1} {Document your decision making why or why not admission, treatments were needed:1} Final Clinical Impression(s) / ED Diagnoses Final diagnoses:  None    Rx / DC Orders ED Discharge Orders     None

## 2022-04-01 DIAGNOSIS — M546 Pain in thoracic spine: Secondary | ICD-10-CM | POA: Diagnosis not present

## 2022-04-01 DIAGNOSIS — R0782 Intercostal pain: Secondary | ICD-10-CM | POA: Diagnosis not present

## 2022-04-02 DIAGNOSIS — R519 Headache, unspecified: Secondary | ICD-10-CM | POA: Diagnosis not present

## 2022-04-02 DIAGNOSIS — I10 Essential (primary) hypertension: Secondary | ICD-10-CM | POA: Diagnosis not present

## 2022-04-11 ENCOUNTER — Other Ambulatory Visit (HOSPITAL_COMMUNITY): Payer: Self-pay | Admitting: Internal Medicine

## 2022-04-11 DIAGNOSIS — Z1231 Encounter for screening mammogram for malignant neoplasm of breast: Secondary | ICD-10-CM

## 2022-04-23 DIAGNOSIS — G4489 Other headache syndrome: Secondary | ICD-10-CM | POA: Diagnosis not present

## 2022-04-25 DIAGNOSIS — E7800 Pure hypercholesterolemia, unspecified: Secondary | ICD-10-CM | POA: Diagnosis not present

## 2022-04-25 DIAGNOSIS — R7989 Other specified abnormal findings of blood chemistry: Secondary | ICD-10-CM | POA: Diagnosis not present

## 2022-04-25 DIAGNOSIS — I10 Essential (primary) hypertension: Secondary | ICD-10-CM | POA: Diagnosis not present

## 2022-04-29 ENCOUNTER — Ambulatory Visit (HOSPITAL_COMMUNITY)
Admission: RE | Admit: 2022-04-29 | Discharge: 2022-04-29 | Disposition: A | Discharge status: Home / Self Care | Payer: Medicare HMO | Source: Ambulatory Visit | Attending: Internal Medicine | Admitting: Internal Medicine

## 2022-04-29 DIAGNOSIS — Z1231 Encounter for screening mammogram for malignant neoplasm of breast: Secondary | ICD-10-CM | POA: Insufficient documentation

## 2022-05-02 DIAGNOSIS — I700 Atherosclerosis of aorta: Secondary | ICD-10-CM | POA: Diagnosis not present

## 2022-05-02 DIAGNOSIS — G8929 Other chronic pain: Secondary | ICD-10-CM | POA: Diagnosis not present

## 2022-05-02 DIAGNOSIS — Z23 Encounter for immunization: Secondary | ICD-10-CM | POA: Diagnosis not present

## 2022-05-02 DIAGNOSIS — M791 Myalgia, unspecified site: Secondary | ICD-10-CM | POA: Diagnosis not present

## 2022-05-02 DIAGNOSIS — M5030 Other cervical disc degeneration, unspecified cervical region: Secondary | ICD-10-CM | POA: Diagnosis not present

## 2022-05-02 DIAGNOSIS — Z1339 Encounter for screening examination for other mental health and behavioral disorders: Secondary | ICD-10-CM | POA: Diagnosis not present

## 2022-05-02 DIAGNOSIS — F418 Other specified anxiety disorders: Secondary | ICD-10-CM | POA: Diagnosis not present

## 2022-05-02 DIAGNOSIS — E7800 Pure hypercholesterolemia, unspecified: Secondary | ICD-10-CM | POA: Diagnosis not present

## 2022-05-02 DIAGNOSIS — Z1331 Encounter for screening for depression: Secondary | ICD-10-CM | POA: Diagnosis not present

## 2022-05-02 DIAGNOSIS — I10 Essential (primary) hypertension: Secondary | ICD-10-CM | POA: Diagnosis not present

## 2022-05-02 DIAGNOSIS — Z0000 Encounter for general adult medical examination without abnormal findings: Secondary | ICD-10-CM | POA: Diagnosis not present

## 2022-05-02 DIAGNOSIS — J449 Chronic obstructive pulmonary disease, unspecified: Secondary | ICD-10-CM | POA: Diagnosis not present

## 2022-05-02 DIAGNOSIS — R82998 Other abnormal findings in urine: Secondary | ICD-10-CM | POA: Diagnosis not present

## 2022-05-08 NOTE — Therapy (Unsigned)
OUTPATIENT PHYSICAL THERAPY THORACOLUMBAR EVALUATION   Patient Name: [name] MRN: [id number] DOB:[birth year], 76 y.o., female Today's Date: 05/09/2022   PT End of Session - 05/09/22 1730     Visit Number 1    Number of Visits 12    Date for PT Re-Evaluation 06/20/22    Authorization Type [employer] auth put in    Progress Note Due on Visit 10    PT Start Time 1432    PT Stop Time 1515    PT Time Calculation (min) 43 min    Activity Tolerance Patient tolerated treatment well    Behavior During Therapy [name] for tasks assessed/performed             Past Medical History:  Diagnosis Date   Anxiety    Arthritis    Bronchiolitis    acute   Chronic back pain    Depression    Diverticulosis    GERD (gastroesophageal reflux disease)    negative H pylori stool Ag   High cholesterol    HTN (hypertension)    Hyperplastic colon polyp 11/08/08   IBS (irritable bowel syndrome)    Tubular adenoma of colon    Umbilical hernia    Wears glasses    Past Surgical History:  Procedure Laterality Date   ABDOMINAL HYSTERECTOMY  1985   APPENDECTOMY     BIOPSY  05/09/2016   Procedure: BIOPSY;  Surgeon: Alice Dolin, MD;  [facility];  Service: Endoscopy;;  bx ulcer at ileocecal valve   [name]   both sides   CARPAL TUNNEL RELEASE Right 08/23/2013   Procedure: RIGHT ENDOSCOPIC CARPAL  TUNNEL RELEASE;  Surgeon: Alice Nap, MD;  Location: [place];  Service: Orthopedics;  Laterality: Right;   CATARACT EXTRACTION, BILATERAL     CERVICAL [place] SURGERY  2007   CHOLECYSTECTOMY  1980   COLONOSCOPY  11/08/08   Dr. Gala Romney- hemorrhoids, pancolonic diverticula,,hyperplastic polyp   COLONOSCOPY   08/05/2005   DTO:IZTIWP rectum/Diminutive polyp at 88 cm/Pancolonic diverticula/benign-appearing ulcer in the mid descending colon   COLONOSCOPY N/A 03/30/2013   YKD:XIPJASN anal canal hemorrhoids. Single colonic polyp-removed (tubular adenoma). Colonic  diverticulosis.   COLONOSCOPY N/A 05/09/2016   Dr. Gala Romney: diverticulosis in entire colon, mucosal ulceration at IC valve felt to be related to NSAID effect, otherwise normal. due for surveillance in 2022.    COLONOSCOPY WITH PROPOFOL N/A 11/02/2020   Procedure: COLONOSCOPY WITH PROPOFOL;  Surgeon: Alice Dolin, MD;  [facility];  Service: Endoscopy;  Laterality: N/A;  AM   DORSAL COMPARTMENT RELEASE Left 12/12/2014   Procedure: LEFT WRIST RELEASE DORSAL COMPARTMENT (DEQUERVAIN);  Surgeon: Alice Jakob, MD;  Location: [place];  Service: Orthopedics;  Laterality: Left;   ECTOPIC PREGNANCY SURGERY     bilat   FOOT SURGERY Right 02/2017   NEPHRECTOMY  1975   left-infection   POLYPECTOMY  11/02/2020   Procedure: POLYPECTOMY INTESTINAL;  Surgeon: Alice Dolin, MD;  [facility];  Service: Endoscopy;;   TRIGGER FINGER RELEASE Right 08/23/2013   Procedure: RIGHT INDEX FINGER TRIGGER RELEASE ;  Surgeon: Alice Nap, MD;  Location: [place];  Service: Orthopedics;  Laterality: Right;   Patient Active Problem List   Diagnosis Date Noted   Enlarged skin mole 07/30/2021   Mass of lower inner quadrant of left breast 07/30/2021   Breast pain, left 07/30/2021   Vaginal odor 07/30/2021   Cystocele,  midline 07/30/2021   Abdominal pain, LUQ (left upper quadrant) 07/30/2021   Abdominal pain, epigastric 06/05/2021   IBS (irritable bowel syndrome)    Itching in the vaginal area 11/13/2020   Urinary tract infection with hematuria 05/19/2019   Hematuria 05/19/2019   Urinary frequency 05/19/2019   Numbness and tingling of foot 02/23/2019   BV (bacterial vaginosis) 12/04/2018   Vaginal discharge 12/09/2017   Vaginal atrophy 12/09/2017   Heme positive stool 05/01/2016   Abdominal pain 04/16/2016   Constipation 05/26/2013   Abdominal pain, other specified site 03/16/2013   Mixed hyperlipidemia 12/19/2010   Tubular adenoma of colon  11/14/2010   ABDOMINAL PAIN, LEFT LOWER QUADRANT 03/15/2010   EDEMA 08/25/2009   Musculoskeletal chest pain 08/25/2009   GERD 10/28/2008   HYPERTENSION 10/27/2008   Irritable bowel syndrome 10/27/2008   BACK PAIN, CHRONIC 10/27/2008   DIARRHEA 10/27/2008   ABDOMINAL CRAMPS 10/27/2008   COLONIC POLYPS, HX OF 10/27/2008    PCP: [name]  REFERRING PROVIDER: Gentry Fitz, MD   REFERRING DIAG: PT eval/tx for left rib pain and thoracic scoliosis   Rationale for Evaluation and Treatment: Rehabilitation  THERAPY DIAG:  Thoracic pain  ONSET DATE: 2 years ago  SUBJECTIVE:                                                                                                                                                                                           SUBJECTIVE STATEMENT: [name] states that her MD told her that her scoliosis has significantly increased.   Pt states that her back bothers her if she does any lifting or any housework.  She has increased pain after sitting for more than two hours.  She hurts in the center of her back.  PT states that her feet are numb half the time.   PERTINENT HISTORY:  Chronic back pain with lumbar surgery, hernia which has not been operated on.   PAIN:  Are you having pain? No but greatest pain is an 8/10 when she is active. : Pain location: center of her back  Pain description: achy Aggravating factors: activity and sitting pt will have increased pain after going to her mailbox  Relieving factors: pain medication   PRECAUTIONS: None  WEIGHT BEARING RESTRICTIONS: No  FALLS:  Has patient fallen in last 6 months? No  LIVING ENVIRONMENT: Lives with: lives with their family OCCUPATION: retired  PLOF: Independent  PATIENT GOALS: less pain    OBJECTIVE:   DIAGNOSTIC FINDINGS:  N/A  PATIENT SURVEYS:  FOTO 14  COGNITION: Overall cognitive status: Within functional limits for tasks assessed  POSTURE:  decreased lumbar lordosis, decreased thoracic kyphosis, left pelvic obliquity, and weight shift left Noted scoliosis  PALPATION: Tight paraspinal mm in thoracic area   LUMBAR ROM: not completed due to significant scoliosis     LOWER EXTREMITY MMT:    MMT Right eval Left eval  Hip flexion 5/5 4/5  Hip extension 3-/5 2/5  Hip abduction 3+/5 3/5  Hip adduction    Hip internal rotation    Hip external rotation    Knee flexion 3+/5 3+/5  Knee extension 5 5  Ankle dorsiflexion 5 5  Ankle plantarflexion    Ankle inversion    Ankle eversion     (Blank rows = not tested)    FUNCTIONAL TESTS:  30 seconds chair stand test: 4 x  2 minute walk test: 443f  Single leg stance:  Rt: 6"  LT: 5"   TODAY'S TREATMENT:                                                                                                                              DATE: 05/09/22 Sitting:  tall sitting x 10              Transverse abdominal isometric x 10 Right side lying:  Lt UE abduction x 10 Prone glut set    PATIENT EDUCATION:  Education details: HEP and the importance of posture  Person educated: Patient Education method: Explanation Education comprehension: verbalized understanding  HOME EXERCISE PROGRAM: Access Code: [email or web address]: [email or web address] Date: 05/09/2022 Prepared by: CRayetta Humphrey Exercises - Seated Correct Posture  - 3 x daily - 7 x weekly - 1 sets - 10 reps - 5" hold - Seated Transversus Abdominis Bracing  - 4 x daily - 7 x weekly - 1 sets - 10 reps - 3-5" hold - Sidelying Shoulder Abduction Full Range of Motion with Dumbbell  - 2 x daily - 7 x weekly - 1 sets - 10 reps  ASSESSMENT:  CLINICAL IMPRESSION: Patient is a 76y.o. female who was seen today for physical therapy evaluation and treatment for thoracic pain. Evaluation demonstrates decreased ROM, decreased strength, decreased activity tolerance and increased pain.   OBJECTIVE IMPAIRMENTS: decreased  activity tolerance, decreased ROM, decreased strength, impaired flexibility, and pain.   ACTIVITY LIMITATIONS: carrying, lifting, and bending  PARTICIPATION LIMITATIONS: meal prep, cleaning, laundry, and yard work  PERSONAL FACTORS: Fitness and 1 comorbidity: chronic pain   are also affecting patient's functional outcome.   REHAB POTENTIAL: Good  CLINICAL DECISION MAKING: Stable/uncomplicated  EVALUATION COMPLEXITY: Low   GOALS: Goals reviewed with patient? No  SHORT TERM GOALS: Target date: 05/30/2022  Pt to be I in HEP in order to decrease her pain to no greater than a 5/10 while completing housework  Baseline: Goal status: INITIAL  2.  Pt to increase her core strength to be able to lift groceries and bring them in the house without increased pain Baseline:  Goal status: INITIAL  3.  Pt to be able to single leg stance x 10 " B to demonstrate improved core strength to assist in decreasing pain.  Baseline:  Goal status: INITIAL   LONG TERM GOALS: Target date: 06/20/2022  Pt to be I in an advanced HEP to decrease her pain to no greater than a 2/10 when completing housework  Baseline:  Goal status: INITIAL  2.  PT to be complete 10 sit to stand in 30 seconds to demonstrate improved mobility Baseline:  Goal status: INITIAL  3.  PT LE strength to be increased one grade to decrease stress off of back.  Baseline:  Goal status: INITIAL  4.  Pt to be able to demonstrate proper body mechanics to decrease stress of of her back.  Baseline:  Goal status: INITIAL  PLAN:  PT FREQUENCY: 2x/week  PT DURATION: 4 weeks  PLANNED INTERVENTIONS: Therapeutic exercises, Therapeutic activity, Patient/Family education, Self Care, and Manual therapy.  PLAN FOR NEXT SESSION: Educate in body mechanics, core and lumbar stabilization to assist in decreasing pain progressing to balance activity.  Rayetta Humphrey, [place] CLT [phone]  05/09/2022, 5:33 PM

## 2022-05-09 ENCOUNTER — Ambulatory Visit (HOSPITAL_COMMUNITY): Payer: Medicare HMO | Attending: Family Medicine | Admitting: Physical Therapy

## 2022-05-09 DIAGNOSIS — M6281 Muscle weakness (generalized): Secondary | ICD-10-CM | POA: Diagnosis not present

## 2022-05-09 DIAGNOSIS — M546 Pain in thoracic spine: Secondary | ICD-10-CM | POA: Diagnosis not present

## 2022-05-11 ENCOUNTER — Other Ambulatory Visit: Payer: Self-pay | Admitting: Neurology

## 2022-05-11 DIAGNOSIS — G250 Essential tremor: Secondary | ICD-10-CM

## 2022-05-13 ENCOUNTER — Encounter (HOSPITAL_COMMUNITY): Payer: Self-pay | Admitting: Physical Therapy

## 2022-05-13 ENCOUNTER — Ambulatory Visit (HOSPITAL_COMMUNITY): Payer: Medicare HMO | Admitting: Physical Therapy

## 2022-05-13 DIAGNOSIS — M546 Pain in thoracic spine: Secondary | ICD-10-CM | POA: Diagnosis not present

## 2022-05-13 DIAGNOSIS — M6281 Muscle weakness (generalized): Secondary | ICD-10-CM | POA: Diagnosis not present

## 2022-05-13 NOTE — Therapy (Signed)
OUTPATIENT PHYSICAL THERAPY TREATMENT   Patient Name: DANAY MCKELLAR MRN: [id number] DOB:1945/11/15, 76 y.o., female Today's Date: 05/13/2022   PT End of Session - 05/13/22 1351     Visit Number 2    Number of Visits 12    Date for PT Re-Evaluation 06/20/22    Authorization Type humana approved 12 visits 05/09/22- 06/21/22    Authorization - Visit Number 2    Authorization - Number of Visits 12    Progress Note Due on Visit 10    PT Start Time 1350    PT Stop Time 1428    PT Time Calculation (min) 38 min    Activity Tolerance Patient tolerated treatment well    Behavior During Therapy WFL for tasks assessed/performed             Past Medical History:  Diagnosis Date   Anxiety    Arthritis    Bronchiolitis    acute   Chronic back pain    Depression    Diverticulosis    GERD (gastroesophageal reflux disease)    negative H pylori stool Ag   High cholesterol    HTN (hypertension)    Hyperplastic colon polyp 11/08/08   IBS (irritable bowel syndrome)    Tubular adenoma of colon    Umbilical hernia    Wears glasses    Past Surgical History:  Procedure Laterality Date   ABDOMINAL HYSTERECTOMY  1985   APPENDECTOMY     BIOPSY  05/09/2016   Procedure: BIOPSY;  Surgeon: Daneil Dolin, MD;  [facility];  Service: Endoscopy;;  bx ulcer at ileocecal valve   [place]   both sides   CARPAL TUNNEL RELEASE Right 08/23/2013   Procedure: RIGHT ENDOSCOPIC CARPAL  TUNNEL RELEASE;  Surgeon: Jolyn Nap, MD;  Location: [place];  Service: Orthopedics;  Laterality: Right;   CATARACT EXTRACTION, BILATERAL     CERVICAL [place] SURGERY  2007   CHOLECYSTECTOMY  1980   COLONOSCOPY  11/08/08   Dr. Gala Romney- hemorrhoids, pancolonic diverticula,,hyperplastic polyp   COLONOSCOPY   08/05/2005   OMB:TDHRCB rectum/Diminutive polyp at 40 cm/Pancolonic diverticula/benign-appearing ulcer in the mid descending colon   COLONOSCOPY N/A 03/30/2013   ULA:GTXMIWO  anal canal hemorrhoids. Single colonic polyp-removed (tubular adenoma). Colonic diverticulosis.   COLONOSCOPY N/A 05/09/2016   Dr. Gala Romney: diverticulosis in entire colon, mucosal ulceration at IC valve felt to be related to NSAID effect, otherwise normal. due for surveillance in 2022.    COLONOSCOPY WITH PROPOFOL N/A 11/02/2020   Procedure: COLONOSCOPY WITH PROPOFOL;  Surgeon: Daneil Dolin, MD;  [facility];  Service: Endoscopy;  Laterality: N/A;  AM   DORSAL COMPARTMENT RELEASE Left 12/12/2014   Procedure: LEFT WRIST RELEASE DORSAL COMPARTMENT (DEQUERVAIN);  Surgeon: Milly Jakob, MD;  Location: [place];  Service: Orthopedics;  Laterality: Left;   ECTOPIC PREGNANCY SURGERY     bilat   FOOT SURGERY Right 02/2017   NEPHRECTOMY  1975   left-infection   POLYPECTOMY  11/02/2020   Procedure: POLYPECTOMY INTESTINAL;  Surgeon: Daneil Dolin, MD;  [facility];  Service: Endoscopy;;   TRIGGER FINGER RELEASE Right 08/23/2013   Procedure: RIGHT INDEX FINGER TRIGGER RELEASE ;  Surgeon: Jolyn Nap, MD;  Location: [place];  Service: Orthopedics;  Laterality: Right;   Patient Active Problem List   Diagnosis Date Noted   Enlarged skin mole 07/30/2021   Mass of lower inner quadrant  of left breast 07/30/2021   Breast pain, left 07/30/2021   Vaginal odor 07/30/2021   Cystocele, midline 07/30/2021   Abdominal pain, LUQ (left upper quadrant) 07/30/2021   Abdominal pain, epigastric 06/05/2021   IBS (irritable bowel syndrome)    Itching in the vaginal area 11/13/2020   Urinary tract infection with hematuria 05/19/2019   Hematuria 05/19/2019   Urinary frequency 05/19/2019   Numbness and tingling of foot 02/23/2019   BV (bacterial vaginosis) 12/04/2018   Vaginal discharge 12/09/2017   Vaginal atrophy 12/09/2017   Heme positive stool 05/01/2016   Abdominal pain 04/16/2016   Constipation 05/26/2013   Abdominal pain, other specified site  03/16/2013   Mixed hyperlipidemia 12/19/2010   Tubular adenoma of colon 11/14/2010   ABDOMINAL PAIN, LEFT LOWER QUADRANT 03/15/2010   EDEMA 08/25/2009   Musculoskeletal chest pain 08/25/2009   GERD 10/28/2008   HYPERTENSION 10/27/2008   Irritable bowel syndrome 10/27/2008   BACK PAIN, CHRONIC 10/27/2008   DIARRHEA 10/27/2008   ABDOMINAL CRAMPS 10/27/2008   COLONIC POLYPS, HX OF 10/27/2008    PCP: Domenick Gong  REFERRING PROVIDER: Gentry Fitz, MD   REFERRING DIAG: PT eval/tx for left rib pain and thoracic scoliosis   Rationale for Evaluation and Treatment: Rehabilitation  THERAPY DIAG:  Thoracic pain  ONSET DATE: 2 years ago  SUBJECTIVE:                                                                                                                                                                                           SUBJECTIVE STATEMENT: Exercises went alright at home. She was tired after last time.  PERTINENT HISTORY:  Chronic back pain with lumbar surgery, hernia which has not been operated on.   PAIN:  Are you having pain? 5-6/10 Pain location: low back Pain description: achy Aggravating factors: activity and sitting pt will have increased pain after going to her mailbox  Relieving factors: pain medication   PRECAUTIONS: None  WEIGHT BEARING RESTRICTIONS: No  FALLS:  Has patient fallen in last 6 months? No  LIVING ENVIRONMENT: Lives with: lives with their family OCCUPATION: retired  PLOF: Independent  PATIENT GOALS: less pain    OBJECTIVE:   DIAGNOSTIC FINDINGS:  N/A  PATIENT SURVEYS:  FOTO 22  COGNITION: Overall cognitive status: Within functional limits for tasks assessed      POSTURE: decreased lumbar lordosis, decreased thoracic kyphosis, left pelvic obliquity, and weight shift left Noted scoliosis  PALPATION: Tight paraspinal mm in thoracic area   LUMBAR ROM: not completed due to significant scoliosis     LOWER  EXTREMITY MMT:    MMT Right eval  Left eval  Hip flexion 5/5 4/5  Hip extension 3-/5 2/5  Hip abduction 3+/5 3/5  Hip adduction    Hip internal rotation    Hip external rotation    Knee flexion 3+/5 3+/5  Knee extension 5 5  Ankle dorsiflexion 5 5  Ankle plantarflexion    Ankle inversion    Ankle eversion     (Blank rows = not tested)    FUNCTIONAL TESTS:  30 seconds chair stand test: 4 x  2 minute walk test: 488f  Single leg stance:  Rt: 6"  LT: 5"   TODAY'S TREATMENT:                                                                                                                              05/13/22 Seated ab set 10 x 5 second holds Supine ab set 10x 5 second holds Supine ab set with march 2 x 10 bilateral  Mini bridge 2x 10  Standing hip abduction 2 x 10 bilateral  Standing hip extension 2 x 10 bilateral   DATE: 05/09/22 Sitting:  tall sitting x 10              Transverse abdominal isometric x 10 Right side lying:  Lt UE abduction x 10 Prone glut set    PATIENT EDUCATION:  Education details: 05/13/22: HEP;  EVAL: HEP and the importance of posture  Person educated: Patient Education method: Explanation Education comprehension: verbalized understanding  HOME EXERCISE PROGRAM: Access Code: [email or web address] - Beginner Bridge  - 2 x daily - 7 x weekly - 2 sets - 10 reps - Standing Hip Abduction with Counter Support  - 2 x daily - 7 x weekly - 2 sets - 10 reps Date: 05/09/2022 - Seated Correct Posture  - 3 x daily - 7 x weekly - 1 sets - 10 reps - 5" hold - Seated Transversus Abdominis Bracing  - 4 x daily - 7 x weekly - 1 sets - 10 reps - 3-5" hold - Sidelying Shoulder Abduction Full Range of Motion with Dumbbell  - 2 x daily - 7 x weekly - 1 sets - 10 reps  ASSESSMENT:  CLINICAL IMPRESSION: Patient given cueing for initial abdominal sets with good carry over. Began additional core and hip strengthening exercises which were tolerated well. Intermittent  cueing for core/glute activation required. Notes moderated fatigue at end of session. Patient will continue to benefit from physical therapy in order to improve function and reduce impairment.   OBJECTIVE IMPAIRMENTS: decreased activity tolerance, decreased ROM, decreased strength, impaired flexibility, and pain.   ACTIVITY LIMITATIONS: carrying, lifting, and bending  PARTICIPATION LIMITATIONS: meal prep, cleaning, laundry, and yard work  PERSONAL FACTORS: Fitness and 1 comorbidity: chronic pain   are also affecting patient's functional outcome.   REHAB POTENTIAL: Good  CLINICAL DECISION MAKING: Stable/uncomplicated  EVALUATION COMPLEXITY: Low   GOALS: Goals reviewed with patient? No  SHORT TERM GOALS: Target date: 05/30/2022  Pt to be I in HEP in order to decrease her pain to no greater than a 5/10 while completing housework  Baseline: Goal status: INITIAL  2.  Pt to increase her core strength to be able to lift groceries and bring them in the house without increased pain Baseline:  Goal status: INITIAL  3.  Pt to be able to single leg stance x 10 " B to demonstrate improved core strength to assist in decreasing pain.  Baseline:  Goal status: INITIAL   LONG TERM GOALS: Target date: 06/20/2022  Pt to be I in an advanced HEP to decrease her pain to no greater than a 2/10 when completing housework  Baseline:  Goal status: INITIAL  2.  PT to be complete 10 sit to stand in 30 seconds to demonstrate improved mobility Baseline:  Goal status: INITIAL  3.  PT LE strength to be increased one grade to decrease stress off of back.  Baseline:  Goal status: INITIAL  4.  Pt to be able to demonstrate proper body mechanics to decrease stress of of her back.  Baseline:  Goal status: INITIAL  PLAN:  PT FREQUENCY: 2x/week  PT DURATION: 6 weeks  PLANNED INTERVENTIONS: Therapeutic exercises, Therapeutic activity, Patient/Family education, Self Care, and Manual therapy.  PLAN  FOR NEXT SESSION: Educate in body mechanics, core and lumbar stabilization to assist in decreasing pain progressing to balance activity.  2:31 PM, 05/13/22 Mearl Latin PT, DPT Physical Therapist at [facility]

## 2022-05-16 ENCOUNTER — Encounter (HOSPITAL_COMMUNITY): Payer: Self-pay | Admitting: Physical Therapy

## 2022-05-16 ENCOUNTER — Ambulatory Visit (HOSPITAL_COMMUNITY): Payer: Medicare HMO | Admitting: Physical Therapy

## 2022-05-16 DIAGNOSIS — M6281 Muscle weakness (generalized): Secondary | ICD-10-CM | POA: Diagnosis not present

## 2022-05-16 DIAGNOSIS — M546 Pain in thoracic spine: Secondary | ICD-10-CM | POA: Diagnosis not present

## 2022-05-16 NOTE — Therapy (Signed)
OUTPATIENT PHYSICAL THERAPY TREATMENT   Patient Name: [name] MRN: [id number] DOB:[birth year], 76 y.o., female Today's Date: 05/16/2022   PT End of Session - 05/16/22 1438     Visit Number 3    Number of Visits 12    Date for PT Re-Evaluation 06/20/22    Authorization Type humana approved 12 visits 05/09/22- 06/21/22    Authorization - Visit Number 3    Authorization - Number of Visits 12    Progress Note Due on Visit 10    PT Start Time 9629    PT Stop Time 1520    PT Time Calculation (min) 42 min    Activity Tolerance Patient tolerated treatment well    Behavior During Therapy WFL for tasks assessed/performed             Past Medical History:  Diagnosis Date   Anxiety    Arthritis    Bronchiolitis    acute   Chronic back pain    Depression    Diverticulosis    GERD (gastroesophageal reflux disease)    negative H pylori stool Ag   High cholesterol    HTN (hypertension)    Hyperplastic colon polyp 11/08/08   IBS (irritable bowel syndrome)    Tubular adenoma of colon    Umbilical hernia    Wears glasses    Past Surgical History:  Procedure Laterality Date   ABDOMINAL HYSTERECTOMY  1985   APPENDECTOMY     BIOPSY  05/09/2016   Procedure: BIOPSY;  Surgeon: Daneil Dolin, MD;  [facility];  Service: Endoscopy;;  bx ulcer at ileocecal valve   BREAST SURGERY  1989   both sides   CARPAL TUNNEL RELEASE Right 08/23/2013   Procedure: RIGHT ENDOSCOPIC CARPAL  TUNNEL RELEASE;  Surgeon: Jolyn Nap, MD;  Location: [place];  Service: Orthopedics;  Laterality: Right;   CATARACT EXTRACTION, BILATERAL     CERVICAL [place] SURGERY  2007   CHOLECYSTECTOMY  1980   COLONOSCOPY  11/08/08   Dr. Gala Romney- hemorrhoids, pancolonic diverticula,,hyperplastic polyp   COLONOSCOPY   08/05/2005   BMW:UXLKGM rectum/Diminutive polyp at 17 cm/Pancolonic diverticula/benign-appearing ulcer in the mid descending colon   COLONOSCOPY N/A 03/30/2013   WNU:UVOZDGU  anal canal hemorrhoids. Single colonic polyp-removed (tubular adenoma). Colonic diverticulosis.   COLONOSCOPY N/A 05/09/2016   Dr. Gala Romney: diverticulosis in entire colon, mucosal ulceration at IC valve felt to be related to NSAID effect, otherwise normal. due for surveillance in 2022.    COLONOSCOPY WITH PROPOFOL N/A 11/02/2020   Procedure: COLONOSCOPY WITH PROPOFOL;  Surgeon: Daneil Dolin, MD;  [facility];  Service: Endoscopy;  Laterality: N/A;  AM   DORSAL COMPARTMENT RELEASE Left 12/12/2014   Procedure: LEFT WRIST RELEASE DORSAL COMPARTMENT (DEQUERVAIN);  Surgeon: Milly Jakob, MD;  Location: [place];  Service: Orthopedics;  Laterality: Left;   ECTOPIC PREGNANCY SURGERY     bilat   FOOT SURGERY Right 02/2017   NEPHRECTOMY  1975   left-infection   POLYPECTOMY  11/02/2020   Procedure: POLYPECTOMY INTESTINAL;  Surgeon: Daneil Dolin, MD;  [facility];  Service: Endoscopy;;   TRIGGER FINGER RELEASE Right 08/23/2013   Procedure: RIGHT INDEX FINGER TRIGGER RELEASE ;  Surgeon: Jolyn Nap, MD;  Location: [place];  Service: Orthopedics;  Laterality: Right;   Patient Active Problem List   Diagnosis Date Noted   Enlarged skin mole 07/30/2021   Mass of lower inner quadrant  of left breast 07/30/2021   Breast pain, left 07/30/2021   Vaginal odor 07/30/2021   Cystocele, midline 07/30/2021   Abdominal pain, LUQ (left upper quadrant) 07/30/2021   Abdominal pain, epigastric 06/05/2021   IBS (irritable bowel syndrome)    Itching in the vaginal area 11/13/2020   Urinary tract infection with hematuria 05/19/2019   Hematuria 05/19/2019   Urinary frequency 05/19/2019   Numbness and tingling of foot 02/23/2019   BV (bacterial vaginosis) 12/04/2018   Vaginal discharge 12/09/2017   Vaginal atrophy 12/09/2017   Heme positive stool 05/01/2016   Abdominal pain 04/16/2016   Constipation 05/26/2013   Abdominal pain, other specified site  03/16/2013   Mixed hyperlipidemia 12/19/2010   Tubular adenoma of colon 11/14/2010   ABDOMINAL PAIN, LEFT LOWER QUADRANT 03/15/2010   EDEMA 08/25/2009   Musculoskeletal chest pain 08/25/2009   GERD 10/28/2008   HYPERTENSION 10/27/2008   Irritable bowel syndrome 10/27/2008   BACK PAIN, CHRONIC 10/27/2008   DIARRHEA 10/27/2008   ABDOMINAL CRAMPS 10/27/2008   COLONIC POLYPS, HX OF 10/27/2008    PCP: Domenick Gong  REFERRING PROVIDER: Gentry Fitz, MD   REFERRING DIAG: PT eval/tx for left rib pain and thoracic scoliosis   Rationale for Evaluation and Treatment: Rehabilitation  THERAPY DIAG:  Thoracic pain  ONSET DATE: 2 years ago  SUBJECTIVE:                                                                                                                                                                                           SUBJECTIVE STATEMENT: Patient was sore after last session. Doing better today. Did some chores at home.   PERTINENT HISTORY:  Chronic back pain with lumbar surgery, hernia which has not been operated on.   PAIN:  Are you having pain? 4/10 Pain location: low back Pain description: achy Aggravating factors: activity and sitting pt will have increased pain after going to her mailbox  Relieving factors: pain medication   PRECAUTIONS: None  WEIGHT BEARING RESTRICTIONS: No  FALLS:  Has patient fallen in last 6 months? No  LIVING ENVIRONMENT: Lives with: lives with their family OCCUPATION: retired  PLOF: Independent  PATIENT GOALS: less pain    OBJECTIVE:   DIAGNOSTIC FINDINGS:  N/A  PATIENT SURVEYS:  FOTO 65  COGNITION: Overall cognitive status: Within functional limits for tasks assessed      POSTURE: decreased lumbar lordosis, decreased thoracic kyphosis, left pelvic obliquity, and weight shift left Noted scoliosis  PALPATION: Tight paraspinal mm in thoracic area   LUMBAR ROM: not completed due to significant scoliosis      LOWER EXTREMITY MMT:  MMT Right eval Left eval  Hip flexion 5/5 4/5  Hip extension 3-/5 2/5  Hip abduction 3+/5 3/5  Hip adduction    Hip internal rotation    Hip external rotation    Knee flexion 3+/5 3+/5  Knee extension 5 5  Ankle dorsiflexion 5 5  Ankle plantarflexion    Ankle inversion    Ankle eversion     (Blank rows = not tested)    FUNCTIONAL TESTS:  30 seconds chair stand test: 4 x  2 minute walk test: 440f  Single leg stance:  Rt: 6"  LT: 5"   TODAY'S TREATMENT:                                                                                                                              05/16/22 Supine ab set with march 2 x 10 bilateral  Mini bridge 2x 10  Supine clamshell GTB 2x 10  DKTC with green ball 2 x 10 x 5 second holds   05/13/22 Seated ab set 10 x 5 second holds Supine ab set 10x 5 second holds Supine ab set with march 2 x 10 bilateral  Mini bridge 2x 10  Standing hip abduction 2 x 10 bilateral  Standing hip extension 2 x 10 bilateral   DATE: 05/09/22 Sitting:  tall sitting x 10              Transverse abdominal isometric x 10 Right side lying:  Lt UE abduction x 10 Prone glut set    PATIENT EDUCATION:  Education details: 05/13/22: HEP;  EVAL: HEP and the importance of posture  Person educated: Patient Education method: Explanation Education comprehension: verbalized understanding  HOME EXERCISE PROGRAM: Access Code: [email or web address] - Hooklying Clamshell with Resistance  - 1 x daily - 7 x weekly - 2 sets - 10 reps 05/13/22 - Beginner Bridge  - 2 x daily - 7 x weekly - 2 sets - 10 reps - Standing Hip Abduction with Counter Support  - 2 x daily - 7 x weekly - 2 sets - 10 reps Date: 05/09/2022 - Seated Correct Posture  - 3 x daily - 7 x weekly - 1 sets - 10 reps - 5" hold - Seated Transversus Abdominis Bracing  - 4 x daily - 7 x weekly - 1 sets - 10 reps - 3-5" hold - Sidelying Shoulder Abduction Full Range of Motion with  Dumbbell  - 2 x daily - 7 x weekly - 1 sets - 10 reps  ASSESSMENT:  CLINICAL IMPRESSION: Continued with core and glute strengthening. Patient feeling dizzy at EOS with transition to seated, gave water and took BP at 134/93. Notes moderated fatigue at end of session. Patient will continue to benefit from physical therapy in order to improve function and reduce impairment.   OBJECTIVE IMPAIRMENTS: decreased activity tolerance, decreased ROM, decreased strength, impaired flexibility, and pain.   ACTIVITY LIMITATIONS: carrying, lifting, and bending  PARTICIPATION LIMITATIONS:  meal prep, cleaning, laundry, and yard work  PERSONAL FACTORS: Fitness and 1 comorbidity: chronic pain   are also affecting patient's functional outcome.   REHAB POTENTIAL: Good  CLINICAL DECISION MAKING: Stable/uncomplicated  EVALUATION COMPLEXITY: Low   GOALS: Goals reviewed with patient? No  SHORT TERM GOALS: Target date: 05/30/2022  Pt to be I in HEP in order to decrease her pain to no greater than a 5/10 while completing housework  Baseline: Goal status: INITIAL  2.  Pt to increase her core strength to be able to lift groceries and bring them in the house without increased pain Baseline:  Goal status: INITIAL  3.  Pt to be able to single leg stance x 10 " B to demonstrate improved core strength to assist in decreasing pain.  Baseline:  Goal status: INITIAL   LONG TERM GOALS: Target date: 06/20/2022  Pt to be I in an advanced HEP to decrease her pain to no greater than a 2/10 when completing housework  Baseline:  Goal status: INITIAL  2.  PT to be complete 10 sit to stand in 30 seconds to demonstrate improved mobility Baseline:  Goal status: INITIAL  3.  PT LE strength to be increased one grade to decrease stress off of back.  Baseline:  Goal status: INITIAL  4.  Pt to be able to demonstrate proper body mechanics to decrease stress of of her back.  Baseline:  Goal status:  INITIAL  PLAN:  PT FREQUENCY: 2x/week  PT DURATION: 6 weeks  PLANNED INTERVENTIONS: Therapeutic exercises, Therapeutic activity, Patient/Family education, Self Care, and Manual therapy.  PLAN FOR NEXT SESSION: Educate in body mechanics, core and lumbar stabilization to assist in decreasing pain progressing to balance activity.  3:24 PM, 05/16/22 Mearl Latin PT, DPT Physical Therapist at [facility]

## 2022-05-20 ENCOUNTER — Ambulatory Visit (HOSPITAL_COMMUNITY): Payer: Medicare HMO | Admitting: Physical Therapy

## 2022-05-20 ENCOUNTER — Encounter (HOSPITAL_COMMUNITY): Payer: Self-pay | Admitting: Physical Therapy

## 2022-05-20 DIAGNOSIS — M546 Pain in thoracic spine: Secondary | ICD-10-CM

## 2022-05-20 DIAGNOSIS — M6281 Muscle weakness (generalized): Secondary | ICD-10-CM | POA: Diagnosis not present

## 2022-05-20 NOTE — Therapy (Signed)
OUTPATIENT PHYSICAL THERAPY TREATMENT   Patient Name: [name] MRN: [id number] DOB:08/05/1945, 76 y.o., female Today's Date: 05/20/2022   PT End of Session - 05/20/22 1351     Visit Number 4    Number of Visits 12    Date for PT Re-Evaluation 06/20/22    Authorization Type humana approved 12 visits 05/09/22- 06/21/22    Authorization - Visit Number 4    Authorization - Number of Visits 12    Progress Note Due on Visit 10    PT Start Time 1350    PT Stop Time 1428    PT Time Calculation (min) 38 min    Activity Tolerance Patient tolerated treatment well    Behavior During Therapy WFL for tasks assessed/performed             Past Medical History:  Diagnosis Date   Anxiety    Arthritis    Bronchiolitis    acute   Chronic back pain    Depression    Diverticulosis    GERD (gastroesophageal reflux disease)    negative H pylori stool Ag   High cholesterol    HTN (hypertension)    Hyperplastic colon polyp 11/08/08   IBS (irritable bowel syndrome)    Tubular adenoma of colon    Umbilical hernia    Wears glasses    Past Surgical History:  Procedure Laterality Date   ABDOMINAL HYSTERECTOMY  1985   APPENDECTOMY     BIOPSY  05/09/2016   Procedure: BIOPSY;  Surgeon: Daneil Dolin, MD;  [facility];  Service: Endoscopy;;  bx ulcer at ileocecal valve   [place]   both sides   CARPAL TUNNEL RELEASE Right 08/23/2013   Procedure: RIGHT ENDOSCOPIC CARPAL  TUNNEL RELEASE;  Surgeon: Jolyn Nap, MD;  Location: [place];  Service: Orthopedics;  Laterality: Right;   CATARACT EXTRACTION, BILATERAL     CERVICAL [place] SURGERY  2007   CHOLECYSTECTOMY  1980   COLONOSCOPY  11/08/08   Dr. Gala Romney- hemorrhoids, pancolonic diverticula,,hyperplastic polyp   COLONOSCOPY   08/05/2005   [id number] rectum/Diminutive polyp at 42 cm/Pancolonic diverticula/benign-appearing ulcer in the mid descending colon   COLONOSCOPY N/A 03/30/2013   LGX:QJJHERD  anal canal hemorrhoids. Single colonic polyp-removed (tubular adenoma). Colonic diverticulosis.   COLONOSCOPY N/A 05/09/2016   Dr. Gala Romney: diverticulosis in entire colon, mucosal ulceration at IC valve felt to be related to NSAID effect, otherwise normal. due for surveillance in 2022.    COLONOSCOPY WITH PROPOFOL N/A 11/02/2020   Procedure: COLONOSCOPY WITH PROPOFOL;  Surgeon: Daneil Dolin, MD;  [facility];  Service: Endoscopy;  Laterality: N/A;  AM   DORSAL COMPARTMENT RELEASE Left 12/12/2014   Procedure: LEFT WRIST RELEASE DORSAL COMPARTMENT (DEQUERVAIN);  Surgeon: Milly Jakob, MD;  Location: [place];  Service: Orthopedics;  Laterality: Left;   ECTOPIC PREGNANCY SURGERY     bilat   FOOT SURGERY Right 02/2017   NEPHRECTOMY  1975   left-infection   POLYPECTOMY  11/02/2020   Procedure: POLYPECTOMY INTESTINAL;  Surgeon: Daneil Dolin, MD;  [facility];  Service: Endoscopy;;   TRIGGER FINGER RELEASE Right 08/23/2013   Procedure: RIGHT INDEX FINGER TRIGGER RELEASE ;  Surgeon: Jolyn Nap, MD;  Location: [place];  Service: Orthopedics;  Laterality: Right;   Patient Active Problem List   Diagnosis Date Noted   Enlarged skin mole 07/30/2021   Mass of lower inner quadrant  of left breast 07/30/2021   Breast pain, left 07/30/2021   Vaginal odor 07/30/2021   Cystocele, midline 07/30/2021   Abdominal pain, LUQ (left upper quadrant) 07/30/2021   Abdominal pain, epigastric 06/05/2021   IBS (irritable bowel syndrome)    Itching in the vaginal area 11/13/2020   Urinary tract infection with hematuria 05/19/2019   Hematuria 05/19/2019   Urinary frequency 05/19/2019   Numbness and tingling of foot 02/23/2019   BV (bacterial vaginosis) 12/04/2018   Vaginal discharge 12/09/2017   Vaginal atrophy 12/09/2017   Heme positive stool 05/01/2016   Abdominal pain 04/16/2016   Constipation 05/26/2013   Abdominal pain, other specified site  03/16/2013   Mixed hyperlipidemia 12/19/2010   Tubular adenoma of colon 11/14/2010   ABDOMINAL PAIN, LEFT LOWER QUADRANT 03/15/2010   EDEMA 08/25/2009   Musculoskeletal chest pain 08/25/2009   GERD 10/28/2008   HYPERTENSION 10/27/2008   Irritable bowel syndrome 10/27/2008   BACK PAIN, CHRONIC 10/27/2008   DIARRHEA 10/27/2008   ABDOMINAL CRAMPS 10/27/2008   COLONIC POLYPS, HX OF 10/27/2008    PCP: Domenick Gong  REFERRING PROVIDER: Gentry Fitz, MD   REFERRING DIAG: PT eval/tx for left rib pain and thoracic scoliosis   Rationale for Evaluation and Treatment: Rehabilitation  THERAPY DIAG:  Thoracic pain  ONSET DATE: 2 years ago  SUBJECTIVE:                                                                                                                                                                                           SUBJECTIVE STATEMENT: Patient states back has been tight. Has been busy with funerals.   PERTINENT HISTORY:  Chronic back pain with lumbar surgery, hernia which has not been operated on.   PAIN:  Are you having pain? 4/10 Pain location: low back Pain description: achy Aggravating factors: activity and sitting pt will have increased pain after going to her mailbox  Relieving factors: pain medication   PRECAUTIONS: None  WEIGHT BEARING RESTRICTIONS: No  FALLS:  Has patient fallen in last 6 months? No  LIVING ENVIRONMENT: Lives with: lives with their family OCCUPATION: retired  PLOF: Independent  PATIENT GOALS: less pain    OBJECTIVE:   DIAGNOSTIC FINDINGS:  N/A  PATIENT SURVEYS:  FOTO 82  COGNITION: Overall cognitive status: Within functional limits for tasks assessed      POSTURE: decreased lumbar lordosis, decreased thoracic kyphosis, left pelvic obliquity, and weight shift left Noted scoliosis  PALPATION: Tight paraspinal mm in thoracic area   LUMBAR ROM: not completed due to significant scoliosis     LOWER  EXTREMITY MMT:    MMT Right  eval Left eval  Hip flexion 5/5 4/5  Hip extension 3-/5 2/5  Hip abduction 3+/5 3/5  Hip adduction    Hip internal rotation    Hip external rotation    Knee flexion 3+/5 3+/5  Knee extension 5 5  Ankle dorsiflexion 5 5  Ankle plantarflexion    Ankle inversion    Ankle eversion     (Blank rows = not tested)    FUNCTIONAL TESTS:  30 seconds chair stand test: 4 x  2 minute walk test: 440f  Single leg stance:  Rt: 6"  LT: 5"   TODAY'S TREATMENT:                                                                                                                              05/20/22 Mini bridge 2x 10  SLR with ab set 2 x 10 bilateral  DKTC with green ball 2 x 10 x 5 second holds Dead bug isometric with green ball 2x 10 x 5 second holds Standing row GTB 2 x 10  Standing shoulder extension GTB 2 x 10  Palof press GTB 1 x 15  05/16/22 Supine ab set with march 2 x 10 bilateral  Mini bridge 2x 10  Supine clamshell GTB 2x 10  DKTC with green ball 2 x 10 x 5 second holds   05/13/22 Seated ab set 10 x 5 second holds Supine ab set 10x 5 second holds Supine ab set with march 2 x 10 bilateral  Mini bridge 2x 10  Standing hip abduction 2 x 10 bilateral  Standing hip extension 2 x 10 bilateral   DATE: 05/09/22 Sitting:  tall sitting x 10              Transverse abdominal isometric x 10 Right side lying:  Lt UE abduction x 10 Prone glut set    PATIENT EDUCATION:  Education details: 05/13/22: HEP;  EVAL: HEP and the importance of posture  Person educated: Patient Education method: Explanation Education comprehension: verbalized understanding  HOME EXERCISE PROGRAM: Access Code: [email or web address] - Hooklying Clamshell with Resistance  - 1 x daily - 7 x weekly - 2 sets - 10 reps 05/13/22 - Beginner Bridge  - 2 x daily - 7 x weekly - 2 sets - 10 reps - Standing Hip Abduction with Counter Support  - 2 x daily - 7 x weekly - 2 sets - 10 reps Date:  05/09/2022 - Seated Correct Posture  - 3 x daily - 7 x weekly - 1 sets - 10 reps - 5" hold - Seated Transversus Abdominis Bracing  - 4 x daily - 7 x weekly - 1 sets - 10 reps - 3-5" hold - Sidelying Shoulder Abduction Full Range of Motion with Dumbbell  - 2 x daily - 7 x weekly - 1 sets - 10 reps  ASSESSMENT:  CLINICAL IMPRESSION: Continued with core and glute strengthening. Able to perform additional postural strengthening exercises.  Intermittent cueing for mechanics with good/fair carry over. Patient with moderate fatigue at EOS. Patient will continue to benefit from physical therapy in order to improve function and reduce impairment.   OBJECTIVE IMPAIRMENTS: decreased activity tolerance, decreased ROM, decreased strength, impaired flexibility, and pain.   ACTIVITY LIMITATIONS: carrying, lifting, and bending  PARTICIPATION LIMITATIONS: meal prep, cleaning, laundry, and yard work  PERSONAL FACTORS: Fitness and 1 comorbidity: chronic pain   are also affecting patient's functional outcome.   REHAB POTENTIAL: Good  CLINICAL DECISION MAKING: Stable/uncomplicated  EVALUATION COMPLEXITY: Low   GOALS: Goals reviewed with patient? No  SHORT TERM GOALS: Target date: 05/30/2022  Pt to be I in HEP in order to decrease her pain to no greater than a 5/10 while completing housework  Baseline: Goal status: INITIAL  2.  Pt to increase her core strength to be able to lift groceries and bring them in the house without increased pain Baseline:  Goal status: INITIAL  3.  Pt to be able to single leg stance x 10 " B to demonstrate improved core strength to assist in decreasing pain.  Baseline:  Goal status: INITIAL   LONG TERM GOALS: Target date: 06/20/2022  Pt to be I in an advanced HEP to decrease her pain to no greater than a 2/10 when completing housework  Baseline:  Goal status: INITIAL  2.  PT to be complete 10 sit to stand in 30 seconds to demonstrate improved mobility Baseline:   Goal status: INITIAL  3.  PT LE strength to be increased one grade to decrease stress off of back.  Baseline:  Goal status: INITIAL  4.  Pt to be able to demonstrate proper body mechanics to decrease stress of of her back.  Baseline:  Goal status: INITIAL  PLAN:  PT FREQUENCY: 2x/week  PT DURATION: 6 weeks  PLANNED INTERVENTIONS: Therapeutic exercises, Therapeutic activity, Patient/Family education, Self Care, and Manual therapy.  PLAN FOR NEXT SESSION: Educate in body mechanics, core and lumbar stabilization to assist in decreasing pain progressing to balance activity.  2:27 PM, 05/20/22 Mearl Latin PT, DPT Physical Therapist at [facility]

## 2022-05-22 ENCOUNTER — Ambulatory Visit (HOSPITAL_COMMUNITY): Payer: Medicare HMO | Admitting: Physical Therapy

## 2022-05-22 DIAGNOSIS — M6281 Muscle weakness (generalized): Secondary | ICD-10-CM | POA: Diagnosis not present

## 2022-05-22 DIAGNOSIS — M546 Pain in thoracic spine: Secondary | ICD-10-CM | POA: Diagnosis not present

## 2022-05-22 NOTE — Therapy (Signed)
OUTPATIENT PHYSICAL THERAPY TREATMENT   Patient Name: [name] MRN: [id number] DOB:January 25, 1946, 76 y.o., female Today's Date: 05/22/2022   PT End of Session - 05/22/22 1645    Visit Number 5    Number of Visits 12    Date for PT Re-Evaluation 06/20/22    Authorization Type humana approved 12 visits 05/09/22- 06/21/22    Authorization - Visit Number 5    Authorization - Number of Visits 12    Progress Note Due on Visit 10    PT Start Time 1602    PT Stop Time 1642   PT Time Calculation (min) 40 min    Activity Tolerance Patient tolerated treatment well    Behavior During Therapy [facility] for tasks assessed/performed             Past Medical History:  Diagnosis Date   Anxiety    Arthritis    Bronchiolitis    acute   Chronic back pain    Depression    Diverticulosis    GERD (gastroesophageal reflux disease)    negative H pylori stool Ag   High cholesterol    HTN (hypertension)    Hyperplastic colon polyp 11/08/08   IBS (irritable bowel syndrome)    Tubular adenoma of colon    Umbilical hernia    Wears glasses    Past Surgical History:  Procedure Laterality Date   ABDOMINAL HYSTERECTOMY  1985   APPENDECTOMY     BIOPSY  05/09/2016   Procedure: BIOPSY;  Surgeon: Daneil Dolin, MD;  [facility];  Service: Endoscopy;;  bx ulcer at ileocecal valve   BREAST SURGERY  1989   both sides   CARPAL TUNNEL RELEASE Right 08/23/2013   Procedure: RIGHT ENDOSCOPIC CARPAL  TUNNEL RELEASE;  Surgeon: Jolyn Nap, MD;  Location: [place];  Service: Orthopedics;  Laterality: Right;   CATARACT EXTRACTION, BILATERAL     CERVICAL [place] SURGERY  2007   CHOLECYSTECTOMY  1980   COLONOSCOPY  11/08/08   Dr. Gala Romney- hemorrhoids, pancolonic diverticula,,hyperplastic polyp   COLONOSCOPY   08/05/2005   [id number] rectum/Diminutive polyp at 26 cm/Pancolonic diverticula/benign-appearing ulcer in the mid descending colon   COLONOSCOPY N/A 03/30/2013   ONG:[id number]  anal canal hemorrhoids. Single colonic polyp-removed (tubular adenoma). Colonic diverticulosis.   COLONOSCOPY N/A 05/09/2016   Dr. Gala Romney: diverticulosis in entire colon, mucosal ulceration at IC valve felt to be related to NSAID effect, otherwise normal. due for surveillance in 2022.    COLONOSCOPY WITH PROPOFOL N/A 11/02/2020   Procedure: COLONOSCOPY WITH PROPOFOL;  Surgeon: Daneil Dolin, MD;  [facility];  Service: Endoscopy;  Laterality: N/A;  AM   DORSAL COMPARTMENT RELEASE Left 12/12/2014   Procedure: LEFT WRIST RELEASE DORSAL COMPARTMENT (DEQUERVAIN);  Surgeon: Milly Jakob, MD;  Location: [place];  Service: Orthopedics;  Laterality: Left;   ECTOPIC PREGNANCY SURGERY     bilat   FOOT SURGERY Right 02/2017   NEPHRECTOMY  1975   left-infection   POLYPECTOMY  11/02/2020   Procedure: POLYPECTOMY INTESTINAL;  Surgeon: Daneil Dolin, MD;  [facility];  Service: Endoscopy;;   TRIGGER FINGER RELEASE Right 08/23/2013   Procedure: RIGHT INDEX FINGER TRIGGER RELEASE ;  Surgeon: Jolyn Nap, MD;  Location: [place];  Service: Orthopedics;  Laterality: Right;   Patient Active Problem List   Diagnosis Date Noted   Enlarged skin mole 07/30/2021   Mass of lower inner quadrant of left  breast 07/30/2021   Breast pain, left 07/30/2021   Vaginal odor 07/30/2021   Cystocele, midline 07/30/2021   Abdominal pain, LUQ (left upper quadrant) 07/30/2021   Abdominal pain, epigastric 06/05/2021   IBS (irritable bowel syndrome)    Itching in the vaginal area 11/13/2020   Urinary tract infection with hematuria 05/19/2019   Hematuria 05/19/2019   Urinary frequency 05/19/2019   Numbness and tingling of foot 02/23/2019   BV (bacterial vaginosis) 12/04/2018   Vaginal discharge 12/09/2017   Vaginal atrophy 12/09/2017   Heme positive stool 05/01/2016   Abdominal pain 04/16/2016   Constipation 05/26/2013   Abdominal pain, other specified site  03/16/2013   Mixed hyperlipidemia 12/19/2010   Tubular adenoma of colon 11/14/2010   ABDOMINAL PAIN, LEFT LOWER QUADRANT 03/15/2010   EDEMA 08/25/2009   Musculoskeletal chest pain 08/25/2009   GERD 10/28/2008   HYPERTENSION 10/27/2008   Irritable bowel syndrome 10/27/2008   BACK PAIN, CHRONIC 10/27/2008   DIARRHEA 10/27/2008   ABDOMINAL CRAMPS 10/27/2008   COLONIC POLYPS, HX OF 10/27/2008    PCP: Domenick Gong  REFERRING PROVIDER: Gentry Fitz, MD   REFERRING DIAG: PT eval/tx for left rib pain and thoracic scoliosis   Rationale for Evaluation and Treatment: Rehabilitation  THERAPY DIAG:  Thoracic pain  ONSET DATE: 2 years ago  SUBJECTIVE:                                                                                                                                                                                           SUBJECTIVE STATEMENT: PT states that she is having more low back pain today than anything else.  She has not been able to get her exercises done like she should be.   PERTINENT HISTORY:  Chronic back pain with lumbar surgery, hernia which has not been operated on.   PAIN:  Are you having pain? 6/10 Pain location: low back Pain description: achy Aggravating factors: activity and sitting pt will have increased pain after going to her mailbox  Relieving factors: pain medication   PRECAUTIONS: None  WEIGHT BEARING RESTRICTIONS: No  FALLS:  Has patient fallen in last 6 months? No  LIVING ENVIRONMENT: Lives with: lives with their family OCCUPATION: retired  PLOF: Independent  PATIENT GOALS: less pain    OBJECTIVE:   DIAGNOSTIC FINDINGS:  N/A  PATIENT SURVEYS:  FOTO 40  COGNITION: Overall cognitive status: Within functional limits for tasks assessed      POSTURE: decreased lumbar lordosis, decreased thoracic kyphosis, left pelvic obliquity, and weight shift left Noted scoliosis  PALPATION: Tight paraspinal mm in thoracic area    LUMBAR ROM: not completed  due to significant scoliosis     LOWER EXTREMITY MMT:    MMT Right eval Left eval  Hip flexion 5/5 4/5  Hip extension 3-/5 2/5  Hip abduction 3+/5 3/5  Hip adduction    Hip internal rotation    Hip external rotation    Knee flexion 3+/5 3+/5  Knee extension 5 5  Ankle dorsiflexion 5 5  Ankle plantarflexion    Ankle inversion    Ankle eversion     (Blank rows = not tested)    FUNCTIONAL TESTS:  30 seconds chair stand test: 4 x  2 minute walk test: 442f  Single leg stance:  Rt: 6"  LT: 5"   TODAY'S TREATMENT:     05/22/22 Standing: Scapular retraction x 10 green theraband Rows x 10 green theraband   Shoulder extension x 10 Palof x 10   Supine: Theraband decompression exercises:  The overhead x 5 Side pull x 5 Arm rotation x 5 Sash x 5 Bridge x 10 LTR x 10  Single knee to chest 2 x 30 "  Bent knee raise x 10                                                                                                                      05/20/22 Mini bridge 2x 10  SLR with ab set 2 x 10 bilateral  DKTC with green ball 2 x 10 x 5 second holds Dead bug isometric with green ball 2x 10 x 5 second holds Standing row GTB 2 x 10  Standing shoulder extension GTB 2 x 10  Palof press GTB 1 x 15  05/16/22 Supine ab set with march 2 x 10 bilateral  Mini bridge 2x 10  Supine clamshell GTB 2x 10  DKTC with green ball 2 x 10 x 5 second holds   05/13/22 Seated ab set 10 x 5 second holds Supine ab set 10x 5 second holds Supine ab set with march 2 x 10 bilateral  Mini bridge 2x 10  Standing hip abduction 2 x 10 bilateral  Standing hip extension 2 x 10 bilateral   DATE: 05/09/22 Sitting:  tall sitting x 10              Transverse abdominal isometric x 10 Right side lying:  Lt UE abduction x 10 Prone glut set    PATIENT EDUCATION:  Education details: 05/13/22: HEP;  EVAL: HEP and the importance of posture  Person educated: Patient Education  method: Explanation Education comprehension: verbalized understanding  HOME EXERCISE PROGRAM: Access Code: [email or web address] - Hooklying Clamshell with Resistance  - 1 x daily - 7 x weekly - 2 sets - 10 reps 05/13/22 - Beginner Bridge  - 2 x daily - 7 x weekly - 2 sets - 10 reps - Standing Hip Abduction with Counter Support  - 2 x daily - 7 x weekly - 2 sets - 10 reps Date: 05/09/2022 - Seated Correct Posture  - 3 x daily - 7  x weekly - 1 sets - 10 reps - 5" hold - Seated Transversus Abdominis Bracing  - 4 x daily - 7 x weekly - 1 sets - 10 reps - 3-5" hold - Sidelying Shoulder Abduction Full Range of Motion with Dumbbell  - 2 x daily - 7 x weekly - 1 sets - 10 reps  ASSESSMENT:  CLINICAL IMPRESSION: Continued with core and glute strengthening. Added decompression exercises.  PT demonstrates good form with verbal cuing.  Pt with improved tolerance to exercises. Patient will continue to benefit from physical therapy in order to improve function and reduce impairment.   OBJECTIVE IMPAIRMENTS: decreased activity tolerance, decreased ROM, decreased strength, impaired flexibility, and pain.   ACTIVITY LIMITATIONS: carrying, lifting, and bending  PARTICIPATION LIMITATIONS: meal prep, cleaning, laundry, and yard work  PERSONAL FACTORS: Fitness and 1 comorbidity: chronic pain   are also affecting patient's functional outcome.   REHAB POTENTIAL: Good  CLINICAL DECISION MAKING: Stable/uncomplicated  EVALUATION COMPLEXITY: Low   GOALS: Goals reviewed with patient? No  SHORT TERM GOALS: Target date: 05/30/2022  Pt to be I in HEP in order to decrease her pain to no greater than a 5/10 while completing housework  Baseline: Goal status: IN PROGRESS  2.  Pt to increase her core strength to be able to lift groceries and bring them in the house without increased pain Baseline:  Goal status: IN PROGRESS  3.  Pt to be able to single leg stance x 10 " B to demonstrate improved core strength  to assist in decreasing pain.  Baseline:  Goal status: IN PROGRESS   LONG TERM GOALS: Target date: 06/20/2022  Pt to be I in an advanced HEP to decrease her pain to no greater than a 2/10 when completing housework  Baseline:  Goal status: IN PROGRESS  2.  PT to be complete 10 sit to stand in 30 seconds to demonstrate improved mobility Baseline:  Goal status: IN PROGRESS  3.  PT LE strength to be increased one grade to decrease stress off of back.  Baseline:  Goal status: IN PROGRESS  4.  Pt to be able to demonstrate proper body mechanics to decrease stress of of her back.  Baseline:  Goal status: IN PROGRESS  PLAN:  PT FREQUENCY: 2x/week  PT DURATION: 6 weeks  PLANNED INTERVENTIONS: Therapeutic exercises, Therapeutic activity, Patient/Family education, Self Care, and Manual therapy.  PLAN FOR NEXT SESSION: Educate in body mechanics, core and lumbar stabilization to assist in decreasing pain progressing to balance activity. Rayetta Humphrey, [place] [phone]  [phone]

## 2022-05-23 ENCOUNTER — Encounter (HOSPITAL_COMMUNITY): Payer: Medicare HMO

## 2022-06-03 ENCOUNTER — Ambulatory Visit (HOSPITAL_COMMUNITY): Payer: Medicare HMO | Admitting: Physical Therapy

## 2022-06-03 DIAGNOSIS — M546 Pain in thoracic spine: Secondary | ICD-10-CM | POA: Diagnosis not present

## 2022-06-03 DIAGNOSIS — M6281 Muscle weakness (generalized): Secondary | ICD-10-CM | POA: Diagnosis not present

## 2022-06-03 NOTE — Therapy (Signed)
OUTPATIENT PHYSICAL THERAPY TREATMENT   Patient Name: [name] MRN: [id number] DOB:27-Nov-1945, 76 y.o., female Today's Date: 06/03/2022  END OF SESSION:   PT End of Session - 06/03/22 1038     Visit Number 6    Number of Visits 12    Date for PT Re-Evaluation 06/20/22    Authorization Type humana approved 12 visits 05/09/22- 06/21/22    Authorization - Number of Visits 12    Progress Note Due on Visit 10    PT Start Time 1038    PT Stop Time 1116    PT Time Calculation (min) 38 min    Activity Tolerance Patient tolerated treatment well    Behavior During Therapy WFL for tasks assessed/performed                  Past Medical History:  Diagnosis Date   Anxiety    Arthritis    Bronchiolitis    acute   Chronic back pain    Depression    Diverticulosis    GERD (gastroesophageal reflux disease)    negative H pylori stool Ag   High cholesterol    HTN (hypertension)    Hyperplastic colon polyp 11/08/08   IBS (irritable bowel syndrome)    Tubular adenoma of colon    Umbilical hernia    Wears glasses    Past Surgical History:  Procedure Laterality Date   ABDOMINAL HYSTERECTOMY  1985   APPENDECTOMY     BIOPSY  05/09/2016   Procedure: BIOPSY;  Surgeon: Daneil Dolin, MD;  [facility];  Service: Endoscopy;;  bx ulcer at ileocecal valve   [place]   both sides   CARPAL TUNNEL RELEASE Right 08/23/2013   Procedure: RIGHT ENDOSCOPIC CARPAL  TUNNEL RELEASE;  Surgeon: Jolyn Nap, MD;  Location: [place];  Service: Orthopedics;  Laterality: Right;   CATARACT EXTRACTION, BILATERAL     CERVICAL [place] SURGERY  2007   CHOLECYSTECTOMY  1980   COLONOSCOPY  11/08/08   Dr. Gala Romney- hemorrhoids, pancolonic diverticula,,hyperplastic polyp   COLONOSCOPY   08/05/2005   DGL:OVFIEP rectum/Diminutive polyp at 55 cm/Pancolonic diverticula/benign-appearing ulcer in the mid descending colon   COLONOSCOPY N/A 03/30/2013   PIR:JJOACZY anal  canal hemorrhoids. Single colonic polyp-removed (tubular adenoma). Colonic diverticulosis.   COLONOSCOPY N/A 05/09/2016   Dr. Gala Romney: diverticulosis in entire colon, mucosal ulceration at IC valve felt to be related to NSAID effect, otherwise normal. due for surveillance in 2022.    COLONOSCOPY WITH PROPOFOL N/A 11/02/2020   Procedure: COLONOSCOPY WITH PROPOFOL;  Surgeon: Daneil Dolin, MD;  [facility];  Service: Endoscopy;  Laterality: N/A;  AM   DORSAL COMPARTMENT RELEASE Left 12/12/2014   Procedure: LEFT WRIST RELEASE DORSAL COMPARTMENT (DEQUERVAIN);  Surgeon: Milly Jakob, MD;  Location: [place];  Service: Orthopedics;  Laterality: Left;   ECTOPIC PREGNANCY SURGERY     bilat   FOOT SURGERY Right 02/2017   NEPHRECTOMY  1975   left-infection   POLYPECTOMY  11/02/2020   Procedure: POLYPECTOMY INTESTINAL;  Surgeon: Daneil Dolin, MD;  [facility];  Service: Endoscopy;;   TRIGGER FINGER RELEASE Right 08/23/2013   Procedure: RIGHT INDEX FINGER TRIGGER RELEASE ;  Surgeon: Jolyn Nap, MD;  Location: [place];  Service: Orthopedics;  Laterality: Right;   Patient Active Problem List   Diagnosis Date Noted   Enlarged skin mole 07/30/2021   Mass of lower inner  quadrant of left breast 07/30/2021   Breast pain, left 07/30/2021   Vaginal odor 07/30/2021   Cystocele, midline 07/30/2021   Abdominal pain, LUQ (left upper quadrant) 07/30/2021   Abdominal pain, epigastric 06/05/2021   IBS (irritable bowel syndrome)    Itching in the vaginal area 11/13/2020   Urinary tract infection with hematuria 05/19/2019   Hematuria 05/19/2019   Urinary frequency 05/19/2019   Numbness and tingling of foot 02/23/2019   BV (bacterial vaginosis) 12/04/2018   Vaginal discharge 12/09/2017   Vaginal atrophy 12/09/2017   Heme positive stool 05/01/2016   Abdominal pain 04/16/2016   Constipation 05/26/2013   Abdominal pain, other specified site  03/16/2013   Mixed hyperlipidemia 12/19/2010   Tubular adenoma of colon 11/14/2010   ABDOMINAL PAIN, LEFT LOWER QUADRANT 03/15/2010   EDEMA 08/25/2009   Musculoskeletal chest pain 08/25/2009   GERD 10/28/2008   HYPERTENSION 10/27/2008   Irritable bowel syndrome 10/27/2008   BACK PAIN, CHRONIC 10/27/2008   DIARRHEA 10/27/2008   ABDOMINAL CRAMPS 10/27/2008   COLONIC POLYPS, HX OF 10/27/2008    PCP: Domenick Gong  REFERRING PROVIDER: Gentry Fitz, MD   REFERRING DIAG: PT eval/tx for left rib pain and thoracic scoliosis   Rationale for Evaluation and Treatment: Rehabilitation  THERAPY DIAG:  Thoracic pain  ONSET DATE: 2 years ago  SUBJECTIVE:                                                                                                                                                                                           SUBJECTIVE STATEMENT: PT states she did a lot of running over Thanksgiving.  Her family is 4 hours away and had to do the drive.  States walking the dog and other stuff she did while she was there aggravated her LBP.  States she had no pain prior to traveling.  Today, she is still feeling aggravation in her  lower back with pain today 7/10.  PERTINENT HISTORY:  Chronic back pain with lumbar surgery, hernia which has not been operated on.   PAIN:  Are you having pain? 7/10 Pain location: low back Pain description: achy Aggravating factors: activity and sitting pt will have increased pain after going to her mailbox  Relieving factors: pain medication   PRECAUTIONS: None  WEIGHT BEARING RESTRICTIONS: No  FALLS:  Has patient fallen in last 6 months? No  LIVING ENVIRONMENT: Lives with: lives with their family OCCUPATION: retired  PLOF: Independent  PATIENT GOALS: less pain    OBJECTIVE:   DIAGNOSTIC FINDINGS:  N/A  PATIENT SURVEYS:  FOTO 39  COGNITION: Overall cognitive status: Within functional limits for  tasks  assessed      POSTURE: decreased lumbar lordosis, decreased thoracic kyphosis, left pelvic obliquity, and weight shift left Noted scoliosis  PALPATION: Tight paraspinal mm in thoracic area   LUMBAR ROM: not completed due to significant scoliosis     LOWER EXTREMITY MMT:    MMT Right eval Left eval  Hip flexion 5/5 4/5  Hip extension 3-/5 2/5  Hip abduction 3+/5 3/5  Hip adduction    Hip internal rotation    Hip external rotation    Knee flexion 3+/5 3+/5  Knee extension 5 5  Ankle dorsiflexion 5 5  Ankle plantarflexion    Ankle inversion    Ankle eversion     (Blank rows = not tested)    FUNCTIONAL TESTS:  30 seconds chair stand test: 4 x  2 minute walk test: 444f  Single leg stance:  Rt: 6"  LT: 5"   TODAY'S TREATMENT:     06/03/22 Standing:  GTB retractions 2X10  GTB Rows 2X10  GTB extensions 2X10  GTB paloff in normal stance X10 each direction Supine:  decompression 1-5 10X each  Decompression 1-4 with GTB 10X each   05/22/22 Standing: Scapular retraction x 10 green theraband Rows x 10 green theraband   Shoulder extension x 10 Palof x 10   Supine: Theraband decompression exercises:  The overhead x 5 Side pull x 5 Arm rotation x 5 Sash x 5 Bridge x 10 LTR x 10  Single knee to chest 2 x 30 "  Bent knee raise x 10                                                                                                                      05/20/22 Mini bridge 2x 10  SLR with ab set 2 x 10 bilateral  DKTC with green ball 2 x 10 x 5 second holds Dead bug isometric with green ball 2x 10 x 5 second holds Standing row GTB 2 x 10  Standing shoulder extension GTB 2 x 10  Palof press GTB 1 x 15  05/16/22 Supine ab set with march 2 x 10 bilateral  Mini bridge 2x 10  Supine clamshell GTB 2x 10  DKTC with green ball 2 x 10 x 5 second holds   05/13/22 Seated ab set 10 x 5 second holds Supine ab set 10x 5 second holds Supine ab set with march 2 x 10  bilateral  Mini bridge 2x 10  Standing hip abduction 2 x 10 bilateral  Standing hip extension 2 x 10 bilateral   DATE: 05/09/22 Sitting:  tall sitting x 10              Transverse abdominal isometric x 10 Right side lying:  Lt UE abduction x 10 Prone glut set    PATIENT EDUCATION:  Education details: 05/13/22: HEP;  EVAL: HEP and the importance of posture  Person educated: Patient Education method: Explanation Education comprehension: verbalized understanding  HOME EXERCISE PROGRAM: Access Code:  [email or web address] 05/16/22 - Hooklying Clamshell with Resistance  - 1 x daily - 7 x weekly - 2 sets - 10 reps 05/13/22 - Beginner Bridge  - 2 x daily - 7 x weekly - 2 sets - 10 reps - Standing Hip Abduction with Counter Support  - 2 x daily - 7 x weekly - 2 sets - 10 reps Date: 05/09/2022 - Seated Correct Posture  - 3 x daily - 7 x weekly - 1 sets - 10 reps - 5" hold - Seated Transversus Abdominis Bracing  - 4 x daily - 7 x weekly - 1 sets - 10 reps - 3-5" hold - Sidelying Shoulder Abduction Full Range of Motion with Dumbbell  - 2 x daily - 7 x weekly - 1 sets - 10 reps  ASSESSMENT:  CLINICAL IMPRESSION: Pt with late check in.  Continued with postural awareness, core and glute strengthening.  Tactile and verbal cues needed for theraband exercises for form and core stabilization.  Paloff was very challenging due to weak core stabilization.  Added additional set with these to increase strength.  Continued with decompression exercises.  PT demonstrates good form with verbal cuing.  Patient will continue to benefit from physical therapy in order to improve function and reduce impairment.   OBJECTIVE IMPAIRMENTS: decreased activity tolerance, decreased ROM, decreased strength, impaired flexibility, and pain.   ACTIVITY LIMITATIONS: carrying, lifting, and bending  PARTICIPATION LIMITATIONS: meal prep, cleaning, laundry, and yard work  PERSONAL FACTORS: Fitness and 1 comorbidity: chronic pain   are also  affecting patient's functional outcome.   REHAB POTENTIAL: Good  CLINICAL DECISION MAKING: Stable/uncomplicated  EVALUATION COMPLEXITY: Low   GOALS: Goals reviewed with patient? No  SHORT TERM GOALS: Target date: 05/30/2022  Pt to be I in HEP in order to decrease her pain to no greater than a 5/10 while completing housework  Baseline: Goal status: IN PROGRESS  2.  Pt to increase her core strength to be able to lift groceries and bring them in the house without increased pain Baseline:  Goal status: IN PROGRESS  3.  Pt to be able to single leg stance x 10 " B to demonstrate improved core strength to assist in decreasing pain.  Baseline:  Goal status: IN PROGRESS   LONG TERM GOALS: Target date: 06/20/2022  Pt to be I in an advanced HEP to decrease her pain to no greater than a 2/10 when completing housework  Baseline:  Goal status: IN PROGRESS  2.  PT to be complete 10 sit to stand in 30 seconds to demonstrate improved mobility Baseline:  Goal status: IN PROGRESS  3.  PT LE strength to be increased one grade to decrease stress off of back.  Baseline:  Goal status: IN PROGRESS  4.  Pt to be able to demonstrate proper body mechanics to decrease stress of of her back.  Baseline:  Goal status: IN PROGRESS  PLAN:  PT FREQUENCY: 2x/week  PT DURATION: 6 weeks  PLANNED INTERVENTIONS: Therapeutic exercises, Therapeutic activity, Patient/Family education, Self Care, and Manual therapy.  PLAN FOR NEXT SESSION: Educate in body mechanics, core and lumbar stabilization to assist in decreasing pain progressing to balance activity.  Teena Irani, PTA/CLT [place] Ph: [phone]  Teena Irani, PTA 06/03/2022, 12:19 PM

## 2022-06-06 ENCOUNTER — Encounter (HOSPITAL_COMMUNITY): Payer: Self-pay | Admitting: Physical Therapy

## 2022-06-06 ENCOUNTER — Ambulatory Visit (HOSPITAL_COMMUNITY): Payer: Medicare HMO | Admitting: Physical Therapy

## 2022-06-06 DIAGNOSIS — M546 Pain in thoracic spine: Secondary | ICD-10-CM | POA: Diagnosis not present

## 2022-06-06 DIAGNOSIS — M6281 Muscle weakness (generalized): Secondary | ICD-10-CM | POA: Diagnosis not present

## 2022-06-06 NOTE — Therapy (Signed)
OUTPATIENT PHYSICAL THERAPY TREATMENT   Patient Name: [name] MRN: [id number] DOB:04/10/1946, 76 y.o., female Today's Date: 06/06/2022  END OF SESSION:   PT End of Session - 06/06/22 1120     Visit Number 7    Number of Visits 12    Date for PT Re-Evaluation 06/20/22    Authorization Type humana approved 12 visits 05/09/22- 06/21/22    Authorization - Visit Number 6    Authorization - Number of Visits 12    Progress Note Due on Visit 10    PT Start Time 1120    PT Stop Time 1158    PT Time Calculation (min) 38 min    Activity Tolerance Patient tolerated treatment well    Behavior During Therapy WFL for tasks assessed/performed                  Past Medical History:  Diagnosis Date   Anxiety    Arthritis    Bronchiolitis    acute   Chronic back pain    Depression    Diverticulosis    GERD (gastroesophageal reflux disease)    negative H pylori stool Ag   High cholesterol    HTN (hypertension)    Hyperplastic colon polyp 11/08/08   IBS (irritable bowel syndrome)    Tubular adenoma of colon    Umbilical hernia    Wears glasses    Past Surgical History:  Procedure Laterality Date   ABDOMINAL HYSTERECTOMY  1985   APPENDECTOMY     BIOPSY  05/09/2016   Procedure: BIOPSY;  Surgeon: Daneil Dolin, MD;  [facility];  Service: Endoscopy;;  bx ulcer at ileocecal valve   BREAST SURGERY  1989   both sides   CARPAL TUNNEL RELEASE Right 08/23/2013   Procedure: RIGHT ENDOSCOPIC CARPAL  TUNNEL RELEASE;  Surgeon: Jolyn Nap, MD;  Location: [place];  Service: Orthopedics;  Laterality: Right;   CATARACT EXTRACTION, BILATERAL     CERVICAL [place] SURGERY  2007   CHOLECYSTECTOMY  1980   COLONOSCOPY  11/08/08   Dr. Gala Romney- hemorrhoids, pancolonic diverticula,,hyperplastic polyp   COLONOSCOPY   08/05/2005   JKK:[id number] rectum/Diminutive polyp at 31 cm/Pancolonic diverticula/benign-appearing ulcer in the mid descending colon   COLONOSCOPY  N/A 03/30/2013   XHB:[id number] anal canal hemorrhoids. Single colonic polyp-removed (tubular adenoma). Colonic diverticulosis.   COLONOSCOPY N/A 05/09/2016   Dr. Gala Romney: diverticulosis in entire colon, mucosal ulceration at IC valve felt to be related to NSAID effect, otherwise normal. due for surveillance in 2022.    COLONOSCOPY WITH PROPOFOL N/A 11/02/2020   Procedure: COLONOSCOPY WITH PROPOFOL;  Surgeon: Daneil Dolin, MD;  [facility];  Service: Endoscopy;  Laterality: N/A;  AM   DORSAL COMPARTMENT RELEASE Left 12/12/2014   Procedure: LEFT WRIST RELEASE DORSAL COMPARTMENT (DEQUERVAIN);  Surgeon: Milly Jakob, MD;  Location: [place];  Service: Orthopedics;  Laterality: Left;   ECTOPIC PREGNANCY SURGERY     bilat   FOOT SURGERY Right 02/2017   NEPHRECTOMY  1975   left-infection   POLYPECTOMY  11/02/2020   Procedure: POLYPECTOMY INTESTINAL;  Surgeon: Daneil Dolin, MD;  [facility];  Service: Endoscopy;;   TRIGGER FINGER RELEASE Right 08/23/2013   Procedure: RIGHT INDEX FINGER TRIGGER RELEASE ;  Surgeon: Jolyn Nap, MD;  Location: [place];  Service: Orthopedics;  Laterality: Right;   Patient Active Problem List   Diagnosis Date Noted   Enlarged skin  mole 07/30/2021   Mass of lower inner quadrant of left breast 07/30/2021   Breast pain, left 07/30/2021   Vaginal odor 07/30/2021   Cystocele, midline 07/30/2021   Abdominal pain, LUQ (left upper quadrant) 07/30/2021   Abdominal pain, epigastric 06/05/2021   IBS (irritable bowel syndrome)    Itching in the vaginal area 11/13/2020   Urinary tract infection with hematuria 05/19/2019   Hematuria 05/19/2019   Urinary frequency 05/19/2019   Numbness and tingling of foot 02/23/2019   BV (bacterial vaginosis) 12/04/2018   Vaginal discharge 12/09/2017   Vaginal atrophy 12/09/2017   Heme positive stool 05/01/2016   Abdominal pain 04/16/2016   Constipation 05/26/2013   Abdominal  pain, other specified site 03/16/2013   Mixed hyperlipidemia 12/19/2010   Tubular adenoma of colon 11/14/2010   ABDOMINAL PAIN, LEFT LOWER QUADRANT 03/15/2010   EDEMA 08/25/2009   Musculoskeletal chest pain 08/25/2009   GERD 10/28/2008   HYPERTENSION 10/27/2008   Irritable bowel syndrome 10/27/2008   BACK PAIN, CHRONIC 10/27/2008   DIARRHEA 10/27/2008   ABDOMINAL CRAMPS 10/27/2008   COLONIC POLYPS, HX OF 10/27/2008    PCP: Domenick Gong  REFERRING PROVIDER: Gentry Fitz, MD   REFERRING DIAG: PT eval/tx for left rib pain and thoracic scoliosis   Rationale for Evaluation and Treatment: Rehabilitation  THERAPY DIAG:  Thoracic pain  ONSET DATE: 2 years ago  SUBJECTIVE:                                                                                                                                                                                           SUBJECTIVE STATEMENT: Patient states back was rough during thanksgiving from doing too much. Still having back trouble due to that she has been running a lot.   PERTINENT HISTORY:  Chronic back pain with lumbar surgery, hernia which has not been operated on.   PAIN:  Are you having pain? 3/10 Pain location: low back Pain description: achy Aggravating factors: activity and sitting pt will have increased pain after going to her mailbox  Relieving factors: pain medication   PRECAUTIONS: None  WEIGHT BEARING RESTRICTIONS: No  FALLS:  Has patient fallen in last 6 months? No  LIVING ENVIRONMENT: Lives with: lives with their family OCCUPATION: retired  PLOF: Independent  PATIENT GOALS: less pain    OBJECTIVE:   DIAGNOSTIC FINDINGS:  N/A  PATIENT SURVEYS:  FOTO 62  COGNITION: Overall cognitive status: Within functional limits for tasks assessed      POSTURE: decreased lumbar lordosis, decreased thoracic kyphosis, left pelvic obliquity, and weight shift left Noted scoliosis  PALPATION: Tight  paraspinal mm in thoracic area  LUMBAR ROM: not completed due to significant scoliosis     LOWER EXTREMITY MMT:    MMT Right eval Left eval  Hip flexion 5/5 4/5  Hip extension 3-/5 2/5  Hip abduction 3+/5 3/5  Hip adduction    Hip internal rotation    Hip external rotation    Knee flexion 3+/5 3+/5  Knee extension 5 5  Ankle dorsiflexion 5 5  Ankle plantarflexion    Ankle inversion    Ankle eversion     (Blank rows = not tested)    FUNCTIONAL TESTS:  30 seconds chair stand test: 4 x  2 minute walk test: 46f  Single leg stance:  Rt: 6"  LT: 5"   TODAY'S TREATMENT:     06/06/22 DKTC with green ball 2 x 10 x 5 second holds SLR with ab sets 3x 10 bilateral Supine decompression with band exercises 1-4 GTB x 10  each Standing Row GTB 1x 20 Standing shoulder extension 1x 20   06/03/22 Standing:  GTB retractions 2X10  GTB Rows 2X10  GTB extensions 2X10  GTB paloff in normal stance X10 each direction Supine:  decompression 1-5 10X each  Decompression 1-4 with GTB 10X each   05/22/22 Standing: Scapular retraction x 10 green theraband Rows x 10 green theraband   Shoulder extension x 10 Palof x 10   Supine: Theraband decompression exercises:  The overhead x 5 Side pull x 5 Arm rotation x 5 Sash x 5 Bridge x 10 LTR x 10  Single knee to chest 2 x 30 "  Bent knee raise x 10                                                                                                                      05/20/22 Mini bridge 2x 10  SLR with ab set 2 x 10 bilateral  DKTC with green ball 2 x 10 x 5 second holds Dead bug isometric with green ball 2x 10 x 5 second holds Standing row GTB 2 x 10  Standing shoulder extension GTB 2 x 10  Palof press GTB 1 x 15  05/16/22 Supine ab set with march 2 x 10 bilateral  Mini bridge 2x 10  Supine clamshell GTB 2x 10  DKTC with green ball 2 x 10 x 5 second holds   05/13/22 Seated ab set 10 x 5 second holds Supine ab set 10x 5  second holds Supine ab set with march 2 x 10 bilateral  Mini bridge 2x 10  Standing hip abduction 2 x 10 bilateral  Standing hip extension 2 x 10 bilateral     PATIENT EDUCATION:  Education details: 05/13/22: HEP;  EVAL: HEP and the importance of posture  Person educated: Patient Education method: Explanation Education comprehension: verbalized understanding  HOME EXERCISE PROGRAM: Access Code: [email or web address] - Hooklying Clamshell with Resistance  - 1 x daily - 7 x weekly - 2 sets - 10 reps 05/13/22 - Beginner Bridge  - 2 x daily -  7 x weekly - 2 sets - 10 reps - Standing Hip Abduction with Counter Support  - 2 x daily - 7 x weekly - 2 sets - 10 reps Date: 05/09/2022 - Seated Correct Posture  - 3 x daily - 7 x weekly - 1 sets - 10 reps - 5" hold - Seated Transversus Abdominis Bracing  - 4 x daily - 7 x weekly - 1 sets - 10 reps - 3-5" hold - Sidelying Shoulder Abduction Full Range of Motion with Dumbbell  - 2 x daily - 7 x weekly - 1 sets - 10 reps  ASSESSMENT:  CLINICAL IMPRESSION: Continued with core and postural strengthening which is tolerated well. Patient with continued symptoms aggravated by activity but is making progress since beginning PT. Patient will continue to benefit from physical therapy in order to improve function and reduce impairment.   OBJECTIVE IMPAIRMENTS: decreased activity tolerance, decreased ROM, decreased strength, impaired flexibility, and pain.   ACTIVITY LIMITATIONS: carrying, lifting, and bending  PARTICIPATION LIMITATIONS: meal prep, cleaning, laundry, and yard work  PERSONAL FACTORS: Fitness and 1 comorbidity: chronic pain   are also affecting patient's functional outcome.   REHAB POTENTIAL: Good  CLINICAL DECISION MAKING: Stable/uncomplicated  EVALUATION COMPLEXITY: Low   GOALS: Goals reviewed with patient? No  SHORT TERM GOALS: Target date: 05/30/2022  Pt to be I in HEP in order to decrease her pain to no greater than a 5/10 while  completing housework  Baseline: Goal status: IN PROGRESS  2.  Pt to increase her core strength to be able to lift groceries and bring them in the house without increased pain Baseline:  Goal status: IN PROGRESS  3.  Pt to be able to single leg stance x 10 " B to demonstrate improved core strength to assist in decreasing pain.  Baseline:  Goal status: IN PROGRESS   LONG TERM GOALS: Target date: 06/20/2022  Pt to be I in an advanced HEP to decrease her pain to no greater than a 2/10 when completing housework  Baseline:  Goal status: IN PROGRESS  2.  PT to be complete 10 sit to stand in 30 seconds to demonstrate improved mobility Baseline:  Goal status: IN PROGRESS  3.  PT LE strength to be increased one grade to decrease stress off of back.  Baseline:  Goal status: IN PROGRESS  4.  Pt to be able to demonstrate proper body mechanics to decrease stress of of her back.  Baseline:  Goal status: IN PROGRESS  PLAN:  PT FREQUENCY: 2x/week  PT DURATION: 6 weeks  PLANNED INTERVENTIONS: Therapeutic exercises, Therapeutic activity, Patient/Family education, Self Care, and Manual therapy.  PLAN FOR NEXT SESSION: Educate in body mechanics, core and lumbar stabilization to assist in decreasing pain progressing to balance activity.    Vianne Bulls Vee Bahe, PT 06/06/2022, 11:21 AM

## 2022-06-07 DIAGNOSIS — M546 Pain in thoracic spine: Secondary | ICD-10-CM | POA: Diagnosis not present

## 2022-06-10 ENCOUNTER — Ambulatory Visit (HOSPITAL_COMMUNITY): Payer: Medicare HMO | Attending: Family Medicine | Admitting: Physical Therapy

## 2022-06-10 ENCOUNTER — Other Ambulatory Visit (HOSPITAL_COMMUNITY): Payer: Self-pay | Admitting: Family Medicine

## 2022-06-10 DIAGNOSIS — M6281 Muscle weakness (generalized): Secondary | ICD-10-CM | POA: Insufficient documentation

## 2022-06-10 DIAGNOSIS — M546 Pain in thoracic spine: Secondary | ICD-10-CM | POA: Insufficient documentation

## 2022-06-10 DIAGNOSIS — M542 Cervicalgia: Secondary | ICD-10-CM | POA: Insufficient documentation

## 2022-06-10 DIAGNOSIS — M4124 Other idiopathic scoliosis, thoracic region: Secondary | ICD-10-CM | POA: Insufficient documentation

## 2022-06-10 DIAGNOSIS — R109 Unspecified abdominal pain: Secondary | ICD-10-CM

## 2022-06-10 DIAGNOSIS — R1012 Left upper quadrant pain: Secondary | ICD-10-CM

## 2022-06-10 NOTE — Therapy (Signed)
OUTPATIENT PHYSICAL THERAPY TREATMENT   Patient Name: [name] MRN: [id number] DOB:Nov 07, 1945, 76 y.o., female Today's Date: 06/10/2022  END OF SESSION:   PT End of Session - 06/10/22 1201    Visit Number 8    Number of Visits 12    Date for PT Re-Evaluation 06/20/22    Authorization Type humana approved 12 visits 05/09/22- 06/21/22    Authorization - Visit Number 7    Authorization - Number of Visits 12    Progress Note Due on Visit 10    PT Start Time 1130    PT Stop Time 1200    PT Time Calculation (min) 30 min    Activity Tolerance Patient tolerated treatment well    Behavior During Therapy WFL for tasks assessed/performed                  Past Medical History:  Diagnosis Date   Anxiety    Arthritis    Bronchiolitis    acute   Chronic back pain    Depression    Diverticulosis    GERD (gastroesophageal reflux disease)    negative H pylori stool Ag   High cholesterol    HTN (hypertension)    Hyperplastic colon polyp 11/08/08   IBS (irritable bowel syndrome)    Tubular adenoma of colon    Umbilical hernia    Wears glasses    Past Surgical History:  Procedure Laterality Date   ABDOMINAL HYSTERECTOMY  1985   APPENDECTOMY     BIOPSY  05/09/2016   Procedure: BIOPSY;  Surgeon: Daneil Dolin, MD;  [facility];  Service: Endoscopy;;  bx ulcer at ileocecal valve   [place]   both sides   CARPAL TUNNEL RELEASE Right 08/23/2013   Procedure: RIGHT ENDOSCOPIC CARPAL  TUNNEL RELEASE;  Surgeon: Jolyn Nap, MD;  Location: [place];  Service: Orthopedics;  Laterality: Right;   CATARACT EXTRACTION, BILATERAL     CERVICAL [place] SURGERY  2007   CHOLECYSTECTOMY  1980   COLONOSCOPY  11/08/08   Dr. Gala Romney- hemorrhoids, pancolonic diverticula,,hyperplastic polyp   COLONOSCOPY   08/05/2005   OZH:YQMVHQ rectum/Diminutive polyp at 61 cm/Pancolonic diverticula/benign-appearing ulcer in the mid descending colon   COLONOSCOPY N/A  03/30/2013   ION:GEXBMWU anal canal hemorrhoids. Single colonic polyp-removed (tubular adenoma). Colonic diverticulosis.   COLONOSCOPY N/A 05/09/2016   Dr. Gala Romney: diverticulosis in entire colon, mucosal ulceration at IC valve felt to be related to NSAID effect, otherwise normal. due for surveillance in 2022.    COLONOSCOPY WITH PROPOFOL N/A 11/02/2020   Procedure: COLONOSCOPY WITH PROPOFOL;  Surgeon: Daneil Dolin, MD;  [facility];  Service: Endoscopy;  Laterality: N/A;  AM   DORSAL COMPARTMENT RELEASE Left 12/12/2014   Procedure: LEFT WRIST RELEASE DORSAL COMPARTMENT (DEQUERVAIN);  Surgeon: Milly Jakob, MD;  Location: [place];  Service: Orthopedics;  Laterality: Left;   ECTOPIC PREGNANCY SURGERY     bilat   FOOT SURGERY Right 02/2017   NEPHRECTOMY  1975   left-infection   POLYPECTOMY  11/02/2020   Procedure: POLYPECTOMY INTESTINAL;  Surgeon: Daneil Dolin, MD;  [facility];  Service: Endoscopy;;   TRIGGER FINGER RELEASE Right 08/23/2013   Procedure: RIGHT INDEX FINGER TRIGGER RELEASE ;  Surgeon: Jolyn Nap, MD;  Location: [place];  Service: Orthopedics;  Laterality: Right;   Patient Active Problem List   Diagnosis Date Noted   Enlarged skin mole  07/30/2021   Mass of lower inner quadrant of left breast 07/30/2021   Breast pain, left 07/30/2021   Vaginal odor 07/30/2021   Cystocele, midline 07/30/2021   Abdominal pain, LUQ (left upper quadrant) 07/30/2021   Abdominal pain, epigastric 06/05/2021   IBS (irritable bowel syndrome)    Itching in the vaginal area 11/13/2020   Urinary tract infection with hematuria 05/19/2019   Hematuria 05/19/2019   Urinary frequency 05/19/2019   Numbness and tingling of foot 02/23/2019   BV (bacterial vaginosis) 12/04/2018   Vaginal discharge 12/09/2017   Vaginal atrophy 12/09/2017   Heme positive stool 05/01/2016   Abdominal pain 04/16/2016   Constipation 05/26/2013   Abdominal pain,  other specified site 03/16/2013   Mixed hyperlipidemia 12/19/2010   Tubular adenoma of colon 11/14/2010   ABDOMINAL PAIN, LEFT LOWER QUADRANT 03/15/2010   EDEMA 08/25/2009   Musculoskeletal chest pain 08/25/2009   GERD 10/28/2008   HYPERTENSION 10/27/2008   Irritable bowel syndrome 10/27/2008   BACK PAIN, CHRONIC 10/27/2008   DIARRHEA 10/27/2008   ABDOMINAL CRAMPS 10/27/2008   COLONIC POLYPS, HX OF 10/27/2008    PCP: Domenick Gong  REFERRING PROVIDER: Gentry Fitz, MD   REFERRING DIAG: PT eval/tx for left rib pain and thoracic scoliosis   Rationale for Evaluation and Treatment: Rehabilitation  THERAPY DIAG:  Thoracic pain  ONSET DATE: 2 years ago  SUBJECTIVE:                                                                                                                                                                                           SUBJECTIVE STATEMENT:Pt states that she has been very busy she tries to get the exercises done once a day.  She has noted that when both her arms are up fixing her hair she has more pain.   PERTINENT HISTORY:  Chronic back pain with lumbar surgery, hernia which has not been operated on.   PAIN:  Are you having pain? 3/10 Pain location: low back Pain description: achy Aggravating factors: activity and sitting pt will have increased pain after going to her mailbox  Relieving factors: pain medication   PRECAUTIONS: None  WEIGHT BEARING RESTRICTIONS: No  FALLS:  Has patient fallen in last 6 months? No  LIVING ENVIRONMENT: Lives with: lives with their family OCCUPATION: retired  PLOF: Independent  PATIENT GOALS: less pain    OBJECTIVE:   DIAGNOSTIC FINDINGS:  N/A  PATIENT SURVEYS:  FOTO 30  COGNITION: Overall cognitive status: Within functional limits for tasks assessed      POSTURE: decreased lumbar lordosis, decreased thoracic kyphosis, left pelvic obliquity, and weight shift left Noted  scoliosis   PALPATION: Tight paraspinal mm in thoracic area   LUMBAR ROM: not completed due to significant scoliosis     LOWER EXTREMITY MMT:    MMT Right eval Left eval  Hip flexion 5/5 4/5  Hip extension 3-/5 2/5  Hip abduction 3+/5 3/5  Hip adduction    Hip internal rotation    Hip external rotation    Knee flexion 3+/5 3+/5  Knee extension 5 5  Ankle dorsiflexion 5 5  Ankle plantarflexion    Ankle inversion    Ankle eversion     (Blank rows = not tested)    FUNCTIONAL TESTS:  30 seconds chair stand test: 4 x  2 minute walk test: 457f  Single leg stance:  Rt: 6"  LT: 5"   TODAY'S TREATMENT:   06/10/22:  Wall arch x 5 Standing B UE  flexion  keeping back against the wall x 5  PNF II UE with red theraband x 10 each Hip excursion x 3 Sitting Thoracic excursion x 3  Prone: W back x 10 Shoulder extension x 10  Supine: Bridge x 15    06/06/22 DKTC with green ball 2 x 10 x 5 second holds SLR with ab sets 3x 10 bilateral Supine decompression with band exercises 1-4 GTB x 10  each Standing Row GTB 1x 20 Standing shoulder extension 1x 20   06/03/22 Standing:  GTB retractions 2X10  GTB Rows 2X10  GTB extensions 2X10  GTB paloff in normal stance X10 each direction Supine:  decompression 1-5 10X each  Decompression 1-4 with GTB 10X each       PATIENT EDUCATION:  Education details: 05/13/22: HEP;  EVAL: HEP and the importance of posture  Person educated: Patient Education method: Explanation Education comprehension: verbalized understanding  HOME EXERCISE PROGRAM: Access Code: [email or web address] - Shoulder PNF D2 Extension  - 1 x daily - 7 x weekly - 1 sets - 10 reps - 3" hold - Prone W Scapular Retraction  - 1 x daily - 7 x weekly - 1 sets - 10 reps - 3" hold - Prone Shoulder Extension  - 1 x daily - 7 x weekly - 1 sets - 10 reps - 3" hold Hip and thoracic exucursions x 3 reps Postural 3 with theraband green. 05/16/22 - Hooklying Clamshell with Resistance  -  1 x daily - 7 x weekly - 2 sets - 10 reps 05/13/22 - Beginner Bridge  - 2 x daily - 7 x weekly - 2 sets - 10 reps - Standing Hip Abduction with Counter Support  - 2 x daily - 7 x weekly - 2 sets - 10 reps Date: 05/09/2022 - Seated Correct Posture  - 3 x daily - 7 x weekly - 1 sets - 10 reps - 5" hold - Seated Transversus Abdominis Bracing  - 4 x daily - 7 x weekly - 1 sets - 10 reps - 3-5" hold - Sidelying Shoulder Abduction Full Range of Motion with Dumbbell  - 2 x daily - 7 x weekly - 1 sets - 10 reps  ASSESSMENT:  CLINICAL IMPRESSION: Pt 15 minutes late to appointment.  Pt is painfree at this time.  Therapist updated stretches and strengthening exercises for core strength. Pt did well with all exercises.   OBJECTIVE IMPAIRMENTS: decreased activity tolerance, decreased ROM, decreased strength, impaired flexibility, and pain.   ACTIVITY LIMITATIONS: carrying, lifting, and bending  PARTICIPATION LIMITATIONS: meal prep, cleaning, laundry, and yard work  PERSONAL FACTORS: Fitness and  1 comorbidity: chronic pain   are also affecting patient's functional outcome.   REHAB POTENTIAL: Good  CLINICAL DECISION MAKING: Stable/uncomplicated  EVALUATION COMPLEXITY: Low   GOALS: Goals reviewed with patient? No  SHORT TERM GOALS: Target date: 05/30/2022  Pt to be I in HEP in order to decrease her pain to no greater than a 5/10 while completing housework  Baseline: Goal status: IN PROGRESS  2.  Pt to increase her core strength to be able to lift groceries and bring them in the house without increased pain Baseline:  Goal status: IN PROGRESS  3.  Pt to be able to single leg stance x 10 " B to demonstrate improved core strength to assist in decreasing pain.  Baseline:  Goal status: IN PROGRESS   LONG TERM GOALS: Target date: 06/20/2022  Pt to be I in an advanced HEP to decrease her pain to no greater than a 2/10 when completing housework  Baseline:  Goal status: IN PROGRESS  2.  PT  to be complete 10 sit to stand in 30 seconds to demonstrate improved mobility Baseline:  Goal status: IN PROGRESS  3.  PT LE strength to be increased one grade to decrease stress off of back.  Baseline:  Goal status: IN PROGRESS  4.  Pt to be able to demonstrate proper body mechanics to decrease stress of of her back.  Baseline:  Goal status: IN PROGRESS  PLAN:  PT FREQUENCY: 2x/week  PT DURATION: 6 weeks  PLANNED INTERVENTIONS: Therapeutic exercises, Therapeutic activity, Patient/Family education, Self Care, and Manual therapy.  PLAN FOR NEXT SESSION: Continue to Educate in body mechanics, core and lumbar stabilization to assist in decreasing pain progressing to balance activity.  Rayetta Humphrey, [place] CLT [phone]  [phone]

## 2022-06-13 ENCOUNTER — Ambulatory Visit (HOSPITAL_COMMUNITY): Payer: Medicare HMO | Admitting: Physical Therapy

## 2022-06-13 DIAGNOSIS — M6281 Muscle weakness (generalized): Secondary | ICD-10-CM

## 2022-06-13 DIAGNOSIS — M4124 Other idiopathic scoliosis, thoracic region: Secondary | ICD-10-CM | POA: Diagnosis not present

## 2022-06-13 DIAGNOSIS — M546 Pain in thoracic spine: Secondary | ICD-10-CM | POA: Diagnosis not present

## 2022-06-13 DIAGNOSIS — M542 Cervicalgia: Secondary | ICD-10-CM | POA: Diagnosis not present

## 2022-06-13 NOTE — Therapy (Signed)
OUTPATIENT PHYSICAL THERAPY TREATMENT   Patient Name: [name] MRN: [id number] DOB:1946/05/27, 76 y.o., female Today's Date: 06/13/2022   END OF SESSION:   PT End of Session - 06/13/22 1352     Visit Number 9    Number of Visits 12    Date for PT Re-Evaluation 06/20/22    Authorization Type humana approved 12 visits 05/09/22- 06/21/22    Authorization - Visit Number 9    Authorization - Number of Visits 12    Progress Note Due on Visit 10    PT Start Time 1350    PT Stop Time 1430    PT Time Calculation (min) 40 min    Activity Tolerance Patient tolerated treatment well    Behavior During Therapy WFL for tasks assessed/performed                    Past Medical History:  Diagnosis Date   Anxiety    Arthritis    Bronchiolitis    acute   Chronic back pain    Depression    Diverticulosis    GERD (gastroesophageal reflux disease)    negative H pylori stool Ag   High cholesterol    HTN (hypertension)    Hyperplastic colon polyp 11/08/08   IBS (irritable bowel syndrome)    Tubular adenoma of colon    Umbilical hernia    Wears glasses    Past Surgical History:  Procedure Laterality Date   ABDOMINAL HYSTERECTOMY  1985   APPENDECTOMY     BIOPSY  05/09/2016   Procedure: BIOPSY;  Surgeon: Daneil Dolin, MD;  [facility];  Service: Endoscopy;;  bx ulcer at ileocecal valve   [place]   both sides   CARPAL TUNNEL RELEASE Right 08/23/2013   Procedure: RIGHT ENDOSCOPIC CARPAL  TUNNEL RELEASE;  Surgeon: Jolyn Nap, MD;  Location: [address];  Service: Orthopedics;  Laterality: Right;   CATARACT EXTRACTION, BILATERAL     CERVICAL [place] SURGERY  2007   CHOLECYSTECTOMY  1980   COLONOSCOPY  11/08/08   Dr. Gala Romney- hemorrhoids, pancolonic diverticula,,hyperplastic polyp   COLONOSCOPY   08/05/2005   YDX:AJOINO rectum/Diminutive polyp at 50 cm/Pancolonic diverticula/benign-appearing ulcer in the mid descending colon    COLONOSCOPY N/A 03/30/2013   MVE:HMCNOBS anal canal hemorrhoids. Single colonic polyp-removed (tubular adenoma). Colonic diverticulosis.   COLONOSCOPY N/A 05/09/2016   Dr. Gala Romney: diverticulosis in entire colon, mucosal ulceration at IC valve felt to be related to NSAID effect, otherwise normal. due for surveillance in 2022.    COLONOSCOPY WITH PROPOFOL N/A 11/02/2020   Procedure: COLONOSCOPY WITH PROPOFOL;  Surgeon: Daneil Dolin, MD;  [facility];  Service: Endoscopy;  Laterality: N/A;  AM   DORSAL COMPARTMENT RELEASE Left 12/12/2014   Procedure: LEFT WRIST RELEASE DORSAL COMPARTMENT (DEQUERVAIN);  Surgeon: Milly Jakob, MD;  Location: [place];  Service: Orthopedics;  Laterality: Left;   ECTOPIC PREGNANCY SURGERY     bilat   FOOT SURGERY Right 02/2017   NEPHRECTOMY  1975   left-infection   POLYPECTOMY  11/02/2020   Procedure: POLYPECTOMY INTESTINAL;  Surgeon: Daneil Dolin, MD;  [facility];  Service: Endoscopy;;   TRIGGER FINGER RELEASE Right 08/23/2013   Procedure: RIGHT INDEX FINGER TRIGGER RELEASE ;  Surgeon: Jolyn Nap, MD;  Location: [place];  Service: Orthopedics;  Laterality: Right;   Patient Active Problem List   Diagnosis Date Noted  Enlarged skin mole 07/30/2021   Mass of lower inner quadrant of left breast 07/30/2021   Breast pain, left 07/30/2021   Vaginal odor 07/30/2021   Cystocele, midline 07/30/2021   Abdominal pain, LUQ (left upper quadrant) 07/30/2021   Abdominal pain, epigastric 06/05/2021   IBS (irritable bowel syndrome)    Itching in the vaginal area 11/13/2020   Urinary tract infection with hematuria 05/19/2019   Hematuria 05/19/2019   Urinary frequency 05/19/2019   Numbness and tingling of foot 02/23/2019   BV (bacterial vaginosis) 12/04/2018   Vaginal discharge 12/09/2017   Vaginal atrophy 12/09/2017   Heme positive stool 05/01/2016   Abdominal pain 04/16/2016   Constipation 05/26/2013    Abdominal pain, other specified site 03/16/2013   Mixed hyperlipidemia 12/19/2010   Tubular adenoma of colon 11/14/2010   ABDOMINAL PAIN, LEFT LOWER QUADRANT 03/15/2010   EDEMA 08/25/2009   Musculoskeletal chest pain 08/25/2009   GERD 10/28/2008   HYPERTENSION 10/27/2008   Irritable bowel syndrome 10/27/2008   BACK PAIN, CHRONIC 10/27/2008   DIARRHEA 10/27/2008   ABDOMINAL CRAMPS 10/27/2008   COLONIC POLYPS, HX OF 10/27/2008    PCP: Domenick Gong  REFERRING PROVIDER: Gentry Fitz, MD   REFERRING DIAG: PT eval/tx for left rib pain and thoracic scoliosis   Rationale for Evaluation and Treatment: Rehabilitation  THERAPY DIAG:  Thoracic pain  ONSET DATE: 2 years ago  SUBJECTIVE:                                                                                                                                                                                           SUBJECTIVE STATEMENT: Pt states she's been doing too much at home, tried to move her TV to get a cord and shouldn't have done that.  Currently 4/10 in lower back and around to Lt side.  States she's getting a CT scan next week.   PERTINENT HISTORY:  Chronic back pain with lumbar surgery, hernia which has not been operated on.   PAIN:  Are you having pain? 3/10 Pain location: low back Pain description: achy Aggravating factors: activity and sitting pt will have increased pain after going to her mailbox  Relieving factors: pain medication   PRECAUTIONS: None  WEIGHT BEARING RESTRICTIONS: No  FALLS:  Has patient fallen in last 6 months? No  LIVING ENVIRONMENT: Lives with: lives with their family OCCUPATION: retired  PLOF: Independent  PATIENT GOALS: less pain    OBJECTIVE:   DIAGNOSTIC FINDINGS:  N/A  PATIENT SURVEYS:  FOTO 31  COGNITION: Overall cognitive status: Within functional limits for tasks assessed      POSTURE: decreased lumbar lordosis,  decreased thoracic kyphosis, left pelvic  obliquity, and weight shift left Noted scoliosis  PALPATION: Tight paraspinal mm in thoracic area   LUMBAR ROM: not completed due to significant scoliosis     LOWER EXTREMITY MMT:    MMT Right eval Left eval  Hip flexion 5/5 4/5  Hip extension 3-/5 2/5  Hip abduction 3+/5 3/5  Hip adduction    Hip internal rotation    Hip external rotation    Knee flexion 3+/5 3+/5  Knee extension 5 5  Ankle dorsiflexion 5 5  Ankle plantarflexion    Ankle inversion    Ankle eversion     (Blank rows = not tested)    FUNCTIONAL TESTS:  30 seconds chair stand test: 4 x  2 minute walk test: 443f  Single leg stance:  Rt: 6"  LT: 5"   TODAY'S TREATMENT:   06/13/22 Supine: Decompression RTB 1-4 10X each   Bridge 2X10  SLR 2X10 Seated:  thoracic 3D excursions with UE movements 5X each  Wback 10X5" Standing: UE flexion against wall 10X  06/10/22:  Wall arch x 5 Standing B UE  flexion  keeping back against the wall x 5  PNF II UE with red theraband x 10 each Hip excursion x 3 Sitting Thoracic excursion x 3  Prone: W back x 10 Shoulder extension x 10  Supine: Bridge x 15    06/06/22 DKTC with green ball 2 x 10 x 5 second holds SLR with ab sets 3x 10 bilateral Supine decompression with band exercises 1-4 GTB x 10  each Standing Row GTB 1x 20 Standing shoulder extension 1x 20   06/03/22 Standing:  GTB retractions 2X10  GTB Rows 2X10  GTB extensions 2X10  GTB paloff in normal stance X10 each direction Supine:  decompression 1-5 10X each  Decompression 1-4 with GTB 10X each     PATIENT EDUCATION:  Education details: 05/13/22: HEP;  EVAL: HEP and the importance of posture  Person educated: Patient Education method: Explanation Education comprehension: verbalized understanding  HOME EXERCISE PROGRAM: Access Code: [email or web address] - Shoulder PNF D2 Extension  - 1 x daily - 7 x weekly - 1 sets - 10 reps - 3" hold - Prone W Scapular Retraction  - 1 x daily - 7 x weekly  - 1 sets - 10 reps - 3" hold - Prone Shoulder Extension  - 1 x daily - 7 x weekly - 1 sets - 10 reps - 3" hold Hip and thoracic exucursions x 3 reps Postural 3 with theraband green. 05/16/22 - Hooklying Clamshell with Resistance  - 1 x daily - 7 x weekly - 2 sets - 10 reps 05/13/22 - Beginner Bridge  - 2 x daily - 7 x weekly - 2 sets - 10 reps - Standing Hip Abduction with Counter Support  - 2 x daily - 7 x weekly - 2 sets - 10 reps Date: 05/09/2022 - Seated Correct Posture  - 3 x daily - 7 x weekly - 1 sets - 10 reps - 5" hold - Seated Transversus Abdominis Bracing  - 4 x daily - 7 x weekly - 1 sets - 10 reps - 3-5" hold - Sidelying Shoulder Abduction Full Range of Motion with Dumbbell  - 2 x daily - 7 x weekly - 1 sets - 10 reps  ASSESSMENT:  CLINICAL IMPRESSION: Continued with exercise progression.  Pt required manual and verbal cues to complete tband decompression exercises correctly and maintain good posturing during therex.  Pt only c/o mild discomfort with w-back activity.  No other complaints or issues.  Pt will continue to benefit from skilled therapy to address deficits.  OBJECTIVE IMPAIRMENTS: decreased activity tolerance, decreased ROM, decreased strength, impaired flexibility, and pain.   ACTIVITY LIMITATIONS: carrying, lifting, and bending  PARTICIPATION LIMITATIONS: meal prep, cleaning, laundry, and yard work  PERSONAL FACTORS: Fitness and 1 comorbidity: chronic pain   are also affecting patient's functional outcome.   REHAB POTENTIAL: Good  CLINICAL DECISION MAKING: Stable/uncomplicated  EVALUATION COMPLEXITY: Low   GOALS: Goals reviewed with patient? No  SHORT TERM GOALS: Target date: 05/30/2022  Pt to be I in HEP in order to decrease her pain to no greater than a 5/10 while completing housework  Baseline: Goal status: IN PROGRESS  2.  Pt to increase her core strength to be able to lift groceries and bring them in the house without increased pain Baseline:   Goal status: IN PROGRESS  3.  Pt to be able to single leg stance x 10 " B to demonstrate improved core strength to assist in decreasing pain.  Baseline:  Goal status: IN PROGRESS   LONG TERM GOALS: Target date: 06/20/2022  Pt to be I in an advanced HEP to decrease her pain to no greater than a 2/10 when completing housework  Baseline:  Goal status: IN PROGRESS  2.  PT to be complete 10 sit to stand in 30 seconds to demonstrate improved mobility Baseline:  Goal status: IN PROGRESS  3.  PT LE strength to be increased one grade to decrease stress off of back.  Baseline:  Goal status: IN PROGRESS  4.  Pt to be able to demonstrate proper body mechanics to decrease stress of of her back.  Baseline:  Goal status: IN PROGRESS  PLAN:  PT FREQUENCY: 2x/week  PT DURATION: 6 weeks  PLANNED INTERVENTIONS: Therapeutic exercises, Therapeutic activity, Patient/Family education, Self Care, and Manual therapy.  PLAN FOR NEXT SESSION: Continue to Educate in body mechanics, core and lumbar stabilization to assist in decreasing pain progressing to balance activity. Complete 10th visit progress note next session.   Teena Irani, PTA/CLT [place] Ph: [phone]  Teena Irani, PTA 06/13/2022, 2:26 PM

## 2022-06-17 ENCOUNTER — Encounter (HOSPITAL_COMMUNITY): Payer: Self-pay | Admitting: Physical Therapy

## 2022-06-17 ENCOUNTER — Ambulatory Visit (HOSPITAL_COMMUNITY): Payer: Medicare HMO | Admitting: Physical Therapy

## 2022-06-17 DIAGNOSIS — M542 Cervicalgia: Secondary | ICD-10-CM | POA: Diagnosis not present

## 2022-06-17 DIAGNOSIS — M6281 Muscle weakness (generalized): Secondary | ICD-10-CM | POA: Diagnosis not present

## 2022-06-17 DIAGNOSIS — M546 Pain in thoracic spine: Secondary | ICD-10-CM | POA: Diagnosis not present

## 2022-06-17 DIAGNOSIS — M4124 Other idiopathic scoliosis, thoracic region: Secondary | ICD-10-CM | POA: Diagnosis not present

## 2022-06-17 NOTE — Therapy (Signed)
OUTPATIENT PHYSICAL THERAPY TREATMENT   Patient Name: [name] MRN: [id number] DOB:07-Sep-1945, 76 y.o., female Today's Date: 06/17/2022  PHYSICAL THERAPY DISCHARGE SUMMARY  Visits from Start of Care: 10   Current functional level related to goals / functional outcomes: See below   Remaining deficits: See below   Education / Equipment: See below   Patient agrees to discharge. Patient goals were partially met. Patient is being discharged due to meeting the stated rehab goals. And financial reasons   END OF SESSION:   PT End of Session - 06/17/22 1349     Visit Number 10    Number of Visits 12    Date for PT Re-Evaluation 06/20/22    Authorization Type humana approved 12 visits 05/09/22- 06/21/22    Authorization - Visit Number 10    Authorization - Number of Visits 12    Progress Note Due on Visit 63    PT Start Time 1350    PT Stop Time 1428    PT Time Calculation (min) 38 min    Activity Tolerance Patient tolerated treatment well    Behavior During Therapy WFL for tasks assessed/performed                    Past Medical History:  Diagnosis Date   Anxiety    Arthritis    Bronchiolitis    acute   Chronic back pain    Depression    Diverticulosis    GERD (gastroesophageal reflux disease)    negative H pylori stool Ag   High cholesterol    HTN (hypertension)    Hyperplastic colon polyp 11/08/08   IBS (irritable bowel syndrome)    Tubular adenoma of colon    Umbilical hernia    Wears glasses    Past Surgical History:  Procedure Laterality Date   ABDOMINAL HYSTERECTOMY  1985   APPENDECTOMY     BIOPSY  05/09/2016   Procedure: BIOPSY;  Surgeon: Daneil Dolin, MD;  [facility];  Service: Endoscopy;;  bx ulcer at ileocecal valve   BREAST SURGERY  1989   both sides   CARPAL TUNNEL RELEASE Right 08/23/2013   Procedure: RIGHT ENDOSCOPIC CARPAL  TUNNEL RELEASE;  Surgeon: Jolyn Nap, MD;  Location: [place];   Service: Orthopedics;  Laterality: Right;   CATARACT EXTRACTION, BILATERAL     CERVICAL [place] [facility]ERY  2007   CHOLECYSTECTOMY  1980   COLONOSCOPY  11/08/08   Dr. Gala Romney- hemorrhoids, pancolonic diverticula,,hyperplastic polyp   COLONOSCOPY   08/05/2005   EYC:XKGYJE rectum/Diminutive polyp at 75 cm/Pancolonic diverticula/benign-appearing ulcer in the mid descending colon   COLONOSCOPY N/A 03/30/2013   HUD:JSHFWYO anal canal hemorrhoids. Single colonic polyp-removed (tubular adenoma). Colonic diverticulosis.   COLONOSCOPY N/A 05/09/2016   Dr. Gala Romney: diverticulosis in entire colon, mucosal ulceration at IC valve felt to be related to NSAID effect, otherwise normal. due for surveillance in 2022.    COLONOSCOPY WITH PROPOFOL N/A 11/02/2020   Procedure: COLONOSCOPY WITH PROPOFOL;  Surgeon: Daneil Dolin, MD;  [facility]: [facility];  Service: Endoscopy;  Laterality: N/A;  AM   DORSAL COMPARTMENT RELEASE Left 12/12/2014   Procedure: LEFT WRIST RELEASE DORSAL COMPARTMENT (DEQUERVAIN);  Surgeon: Milly Jakob, MD;  Location: [place];  Service: Orthopedics;  Laterality: Left;   ECTOPIC PREGNANCY SURGERY     bilat   FOOT SURGERY Right 02/2017   NEPHRECTOMY  1975   left-infection   POLYPECTOMY  11/02/2020  Procedure: POLYPECTOMY INTESTINAL;  Surgeon: Daneil Dolin, MD;  [facility];  Service: Endoscopy;;   TRIGGER FINGER RELEASE Right 08/23/2013   Procedure: RIGHT INDEX FINGER TRIGGER RELEASE ;  Surgeon: Jolyn Nap, MD;  Location: [place];  Service: Orthopedics;  Laterality: Right;   Patient Active Problem List   Diagnosis Date Noted   Enlarged skin mole 07/30/2021   Mass of lower inner quadrant of left breast 07/30/2021   Breast pain, left 07/30/2021   Vaginal odor 07/30/2021   Cystocele, midline 07/30/2021   Abdominal pain, LUQ (left upper quadrant) 07/30/2021   Abdominal pain, epigastric 06/05/2021   IBS (irritable bowel syndrome)     Itching in the vaginal area 11/13/2020   Urinary tract infection with hematuria 05/19/2019   Hematuria 05/19/2019   Urinary frequency 05/19/2019   Numbness and tingling of foot 02/23/2019   BV (bacterial vaginosis) 12/04/2018   Vaginal discharge 12/09/2017   Vaginal atrophy 12/09/2017   Heme positive stool 05/01/2016   Abdominal pain 04/16/2016   Constipation 05/26/2013   Abdominal pain, other specified site 03/16/2013   Mixed hyperlipidemia 12/19/2010   Tubular adenoma of colon 11/14/2010   ABDOMINAL PAIN, LEFT LOWER QUADRANT 03/15/2010   EDEMA 08/25/2009   Musculoskeletal chest pain 08/25/2009   GERD 10/28/2008   HYPERTENSION 10/27/2008   Irritable bowel syndrome 10/27/2008   BACK PAIN, CHRONIC 10/27/2008   DIARRHEA 10/27/2008   ABDOMINAL CRAMPS 10/27/2008   COLONIC POLYPS, HX OF 10/27/2008    PCP: Domenick Gong  REFERRING PROVIDER: Gentry Fitz, MD   REFERRING DIAG: PT eval/tx for left rib pain and thoracic scoliosis   Rationale for Evaluation and Treatment: Rehabilitation  THERAPY DIAG:  Thoracic pain  ONSET DATE: 2 years ago  SUBJECTIVE:                                                                                                                                                                                           SUBJECTIVE STATEMENT: Pt states she has been busy doing chores at home. Patient states she has been doing HEP. Patient states 85% improvement since beginning PT intervention. Continues to have pain and difficulty with lifting.   PERTINENT HISTORY:  Chronic back pain with lumbar surgery, hernia which has not been operated on.   PAIN:  Are you having pain? 7/10 Pain location: low back Pain description: achy Aggravating factors: activity and sitting pt will have increased pain after going to her mailbox  Relieving factors: pain medication   PRECAUTIONS: None  WEIGHT BEARING RESTRICTIONS: No  FALLS:  Has patient fallen in last 6  months? No  LIVING ENVIRONMENT: Lives with: lives with their family OCCUPATION: retired  PLOF: Independent  PATIENT GOALS: less pain    OBJECTIVE:   DIAGNOSTIC FINDINGS:  N/A  PATIENT SURVEYS:  FOTO 43  COGNITION: Overall cognitive status: Within functional limits for tasks assessed      POSTURE: decreased lumbar lordosis, decreased thoracic kyphosis, left pelvic obliquity, and weight shift left Noted scoliosis  PALPATION: Tight paraspinal mm in thoracic area   LUMBAR ROM: not completed due to significant scoliosis     LOWER EXTREMITY MMT:    MMT Right eval Left eval Right 06/17/22 Left 06/17/22  Hip flexion 5/5 4/_0 Hip extension 3-/5 2/5 3+ 3-  Hip abduction 3+/5 3/5 4+ 4  Hip adduction      Hip internal rotation      Hip external rotation      Knee flexion 3+/5 3+/_1 Knee extension _2 Ankle dorsiflexion _3 Ankle plantarflexion      Ankle inversion      Ankle eversion       (Blank rows = not tested)    FUNCTIONAL TESTS:  30 seconds chair stand test: 4 x  2 minute walk test: 455f  Single leg stance:  Rt: 6"  LT: 5"   TODAY'S TREATMENT:   06/17/22 Reassessment 30 second STS: 9 reps SLS: R 30 seconds , L 17 seconds Demonstrating improving lifting mechanics  06/13/22 Supine: Decompression RTB 1-4 10X each   Bridge 2X10  SLR 2X10 Seated:  thoracic 3D excursions with UE movements 5X each  Wback 10X5" Standing: UE flexion against wall 10X  06/10/22:  Wall arch x 5 Standing B UE  flexion  keeping back against the wall x 5  PNF II UE with red theraband x 10 each Hip excursion x 3 Sitting Thoracic excursion x 3  Prone: W back x 10 Shoulder extension x 10  Supine: Bridge x 15    06/06/22 DKTC with green ball 2 x 10 x 5 second holds SLR with ab sets 3x 10 bilateral Supine decompression with band exercises 1-4 GTB x 10  each Standing Row GTB 1x 20 Standing shoulder extension 1x 20   06/03/22 Standing:  GTB  retractions 2X10  GTB Rows 2X10  GTB extensions 2X10  GTB paloff in normal stance X10 each direction Supine:  decompression 1-5 10X each  Decompression 1-4 with GTB 10X each     PATIENT EDUCATION:  Education details: 06/17/22: HEP;  05/13/22: HEP;  EVAL: HEP and the importance of posture  Person educated: Patient Education method: Explanation Education comprehension: verbalized understanding  HOME EXERCISE PROGRAM: Access Code: [email or web address] - Shoulder PNF D2 Extension  - 1 x daily - 7 x weekly - 1 sets - 10 reps - 3" hold - Prone W Scapular Retraction  - 1 x daily - 7 x weekly - 1 sets - 10 reps - 3" hold - Prone Shoulder Extension  - 1 x daily - 7 x weekly - 1 sets - 10 reps - 3" hold Hip and thoracic exucursions x 3 reps Postural 3 with theraband green. 05/16/22 - Hooklying Clamshell with Resistance  - 1 x daily - 7 x weekly - 2 sets - 10 reps 05/13/22 - Beginner Bridge  - 2 x daily - 7 x weekly - 2 sets - 10 reps - Standing Hip Abduction with Counter Support  - 2 x daily - 7 x weekly - 2 sets -  10 reps Date: 05/09/2022 - Seated Correct Posture  - 3 x daily - 7 x weekly - 1 sets - 10 reps - 5" hold - Seated Transversus Abdominis Bracing  - 4 x daily - 7 x weekly - 1 sets - 10 reps - 3-5" hold - Sidelying Shoulder Abduction Full Range of Motion with Dumbbell  - 2 x daily - 7 x weekly - 1 sets - 10 reps  ASSESSMENT:  CLINICAL IMPRESSION: Patient has met 2/3 short term goals and 3/4 long term goals with ability to complete HEP and improvement in symptoms, strength, ROM, activity tolerance, gait, balance, and functional mobility. Remaining goals not met due to continued deficits in symptoms, strength, functional mobility. Patient has made good progress toward remaining goals and she does demonstrate improving lifting mechanics but continued symptoms. Discussed progress made and continuing HEP and returning to [facility]. Patient will continue to benefit from skilled physical therapy in  order to improve function and reduce impairment.    OBJECTIVE IMPAIRMENTS: decreased activity tolerance, decreased ROM, decreased strength, impaired flexibility, and pain.   ACTIVITY LIMITATIONS: carrying, lifting, and bending  PARTICIPATION LIMITATIONS: meal prep, cleaning, laundry, and yard work  PERSONAL FACTORS: Fitness and 1 comorbidity: chronic pain   are also affecting patient's functional outcome.   REHAB POTENTIAL: Good  CLINICAL DECISION MAKING: Stable/uncomplicated  EVALUATION COMPLEXITY: Low   GOALS: Goals reviewed with patient? No  SHORT TERM GOALS: Target date: 05/30/2022  Pt to be I in HEP in order to decrease her pain to no greater than a 5/10 while completing housework  Baseline: Goal status: MET  2.  Pt to increase her core strength to be able to lift groceries and bring them in the house without increased pain Baseline:  Goal status: NOT MET  3.  Pt to be able to single leg stance x 10 " B to demonstrate improved core strength to assist in decreasing pain.  Baseline:  Goal status: MET   LONG TERM GOALS: Target date: 06/20/2022  Pt to be I in an advanced HEP to decrease her pain to no greater than a 2/10 when completing housework  Baseline: completing HEP, continued symptoms Goal status: partially MET  2.  PT to be complete 10 sit to stand in 30 seconds to demonstrate improved mobility Baseline:  06/17/22 9 reps Goal status: NOT MET  3.  PT LE strength to be increased one grade to decrease stress off of back.  Baseline:  Goal status: MET  4.  Pt to be able to demonstrate proper body mechanics to decrease stress of of her back.  Baseline:  Goal status: MET  PLAN:  PT FREQUENCY: 2x/week  PT DURATION: 6 weeks  PLANNED INTERVENTIONS: Therapeutic exercises, Therapeutic activity, Patient/Family education, Self Care, and Manual therapy.  PLAN FOR NEXT SESSION:n/a     Vianne Bulls Kileen Lange, PT 06/17/2022, 1:50 PM

## 2022-06-18 ENCOUNTER — Ambulatory Visit (HOSPITAL_COMMUNITY)
Admission: RE | Admit: 2022-06-18 | Discharge: 2022-06-18 | Disposition: A | Discharge status: Home / Self Care | Payer: Medicare HMO | Source: Ambulatory Visit | Attending: Family Medicine | Admitting: Family Medicine

## 2022-06-18 DIAGNOSIS — N281 Cyst of kidney, acquired: Secondary | ICD-10-CM | POA: Diagnosis not present

## 2022-06-18 DIAGNOSIS — I2510 Atherosclerotic heart disease of native coronary artery without angina pectoris: Secondary | ICD-10-CM | POA: Diagnosis not present

## 2022-06-18 DIAGNOSIS — R109 Unspecified abdominal pain: Secondary | ICD-10-CM | POA: Diagnosis not present

## 2022-06-18 DIAGNOSIS — N119 Chronic tubulo-interstitial nephritis, unspecified: Secondary | ICD-10-CM | POA: Insufficient documentation

## 2022-06-18 DIAGNOSIS — D229 Melanocytic nevi, unspecified: Secondary | ICD-10-CM | POA: Insufficient documentation

## 2022-06-18 DIAGNOSIS — K5730 Diverticulosis of large intestine without perforation or abscess without bleeding: Secondary | ICD-10-CM | POA: Diagnosis not present

## 2022-06-18 DIAGNOSIS — I700 Atherosclerosis of aorta: Secondary | ICD-10-CM | POA: Diagnosis not present

## 2022-06-18 DIAGNOSIS — R1012 Left upper quadrant pain: Secondary | ICD-10-CM | POA: Insufficient documentation

## 2022-06-18 DIAGNOSIS — R911 Solitary pulmonary nodule: Secondary | ICD-10-CM | POA: Diagnosis not present

## 2022-06-18 DIAGNOSIS — J439 Emphysema, unspecified: Secondary | ICD-10-CM | POA: Diagnosis not present

## 2022-06-18 DIAGNOSIS — K439 Ventral hernia without obstruction or gangrene: Secondary | ICD-10-CM | POA: Diagnosis not present

## 2022-06-18 DIAGNOSIS — K429 Umbilical hernia without obstruction or gangrene: Secondary | ICD-10-CM | POA: Diagnosis not present

## 2022-06-18 LAB — POCT I-STAT CREATININE: Creatinine, Ser: 0.9 mg/dL (ref 0.44–1.00)

## 2022-06-18 MED ORDER — IOHEXOL 350 MG/ML SOLN
75.0000 mL | Freq: Once | INTRAVENOUS | Status: AC | PRN
  Administered 2022-06-18: 75 mL via INTRAVENOUS

## 2022-06-20 ENCOUNTER — Encounter (HOSPITAL_COMMUNITY): Payer: Medicare HMO | Admitting: Physical Therapy

## 2022-06-25 DIAGNOSIS — M65332 Trigger finger, left middle finger: Secondary | ICD-10-CM | POA: Diagnosis not present

## 2022-07-12 DIAGNOSIS — R030 Elevated blood-pressure reading, without diagnosis of hypertension: Secondary | ICD-10-CM | POA: Diagnosis not present

## 2022-07-12 DIAGNOSIS — Z6826 Body mass index (BMI) 26.0-26.9, adult: Secondary | ICD-10-CM | POA: Diagnosis not present

## 2022-07-12 DIAGNOSIS — E663 Overweight: Secondary | ICD-10-CM | POA: Diagnosis not present

## 2022-07-12 DIAGNOSIS — U071 COVID-19: Secondary | ICD-10-CM | POA: Diagnosis not present

## 2022-07-25 DIAGNOSIS — Z1152 Encounter for screening for COVID-19: Secondary | ICD-10-CM | POA: Diagnosis not present

## 2022-09-03 ENCOUNTER — Other Ambulatory Visit: Payer: Self-pay | Admitting: Neurology

## 2022-09-18 DIAGNOSIS — M9902 Segmental and somatic dysfunction of thoracic region: Secondary | ICD-10-CM | POA: Diagnosis not present

## 2022-09-18 DIAGNOSIS — M5442 Lumbago with sciatica, left side: Secondary | ICD-10-CM | POA: Diagnosis not present

## 2022-09-18 DIAGNOSIS — M542 Cervicalgia: Secondary | ICD-10-CM | POA: Diagnosis not present

## 2022-09-18 DIAGNOSIS — M9901 Segmental and somatic dysfunction of cervical region: Secondary | ICD-10-CM | POA: Diagnosis not present

## 2022-09-18 DIAGNOSIS — M9903 Segmental and somatic dysfunction of lumbar region: Secondary | ICD-10-CM | POA: Diagnosis not present

## 2022-09-18 DIAGNOSIS — M546 Pain in thoracic spine: Secondary | ICD-10-CM | POA: Diagnosis not present

## 2022-09-25 DIAGNOSIS — M9901 Segmental and somatic dysfunction of cervical region: Secondary | ICD-10-CM | POA: Diagnosis not present

## 2022-09-25 DIAGNOSIS — M546 Pain in thoracic spine: Secondary | ICD-10-CM | POA: Diagnosis not present

## 2022-09-25 DIAGNOSIS — M5442 Lumbago with sciatica, left side: Secondary | ICD-10-CM | POA: Diagnosis not present

## 2022-09-25 DIAGNOSIS — M9903 Segmental and somatic dysfunction of lumbar region: Secondary | ICD-10-CM | POA: Diagnosis not present

## 2022-09-25 DIAGNOSIS — M9902 Segmental and somatic dysfunction of thoracic region: Secondary | ICD-10-CM | POA: Diagnosis not present

## 2022-09-25 DIAGNOSIS — M542 Cervicalgia: Secondary | ICD-10-CM | POA: Diagnosis not present

## 2022-10-01 DIAGNOSIS — K581 Irritable bowel syndrome with constipation: Secondary | ICD-10-CM | POA: Diagnosis not present

## 2022-10-01 DIAGNOSIS — I10 Essential (primary) hypertension: Secondary | ICD-10-CM | POA: Diagnosis not present

## 2022-10-01 DIAGNOSIS — K219 Gastro-esophageal reflux disease without esophagitis: Secondary | ICD-10-CM | POA: Diagnosis not present

## 2022-10-01 DIAGNOSIS — G8929 Other chronic pain: Secondary | ICD-10-CM | POA: Diagnosis not present

## 2022-10-01 DIAGNOSIS — R002 Palpitations: Secondary | ICD-10-CM | POA: Diagnosis not present

## 2022-10-01 DIAGNOSIS — M79641 Pain in right hand: Secondary | ICD-10-CM | POA: Diagnosis not present

## 2022-10-02 NOTE — Progress Notes (Unsigned)
Assessment/Plan:   1.   Essential Tremor             -continue  primidone, 50 mg, 3 tablets in the morning, 1 tablet at night  2.  GAD  -is driving HA and tremor.    3.  HTN  -Much improved compared to last visit but still better.  4.  F/u 9 months   Subjective:   [name] was seen today in follow up for essential tremor.  My previous records were reviewed prior to todays visit. Patient remains on primidone, 100 mg in the morning and 50 mg at night.  She is doing well with it but states that pharmacy told her she isn't due for a refill but she doesn't have refills left.   Last visit, her blood pressure was very high in our office.  She had reported that it had consistently been that high.  We called her primary care while she was in our office that day, but unfortunately were not able to get a hold of anybody.  She ended up going to the emergency room a few days later because of her blood pressure.  Notes are reviewed.  Her blood pressure at our office had a systolic greater than [id number].  Blood pressure in the emergency room was 164/103.  She was complaining about headaches.  She was treated and evaluated in the emergency room and sent home and followed up with primary care physician a few days later.  Its still high today but much better.   Current prescribed movement disorder medications: Primidone, 50 mg, 3 tablets in the morning, 1 at night     ALLERGIES:   Allergies  Allergen Reactions   Morphine And Related Hypertension    CURRENT MEDICATIONS:  Outpatient Encounter Medications as of 10/03/2022  Medication Sig   albuterol (VENTOLIN HFA) 108 (90 Base) MCG/ACT inhaler INHALE 1 TO 2 PUFFS BY MOUTH FOUR TIMES DAILY AS NEEDED AS DIRECTED FOR DIFFICULT BREATHING OR WHEEZING   amLODipine-valsartan (EXFORGE) 5-160 MG per tablet Take 1 tablet by mouth daily.   Biotin 1000 MCG tablet Take 1,000 mcg by mouth daily.   Carboxymeth-Glyc-Polysorb PF (REFRESH DIGITAL PF) 0.5-1-0.5 %  SOLN Place 1 drop into both eyes 3 (three) times daily as needed (dry eyes).   Cholecalciferol (VITAMIN D) 50 MCG (2000 UT) tablet Take 2,000 Units by mouth daily.   co-enzyme Q-10 30 MG capsule Take 30 mg by mouth 3 (three) times daily.   escitalopram (LEXAPRO) 10 MG tablet Take 10 mg by mouth daily.   fluvastatin (LESCOL) 20 MG capsule Take 20 mg by mouth 3 (three) times a week.   furosemide (LASIX) 20 MG tablet Take 20 mg by mouth daily.   HYDROcodone-acetaminophen (NORCO/VICODIN) 5-325 MG tablet Take 1 tablet by mouth 2 (two) times daily.   Menthol, Topical Analgesic, (BIOFREEZE) 4 % GEL Apply 1 application topically daily as needed (pain).   metoprolol tartrate (LOPRESSOR) 25 MG tablet Take 25 mg by mouth daily.   Multiple Vitamins-Minerals (MULTIVITAMIN ADULT) CHEW Chew 2 each by mouth daily.   omeprazole (PRILOSEC) 20 MG capsule Take one capsule 30 minutes before breakfast. Take another 30 minutes before evening meal if needed.   OVER THE COUNTER MEDICATION Uses smooth move and prunes for constipation   primidone (MYSOLINE) 50 MG tablet TAKE 3 TABS IN THE AM AND 1 TAB IN THE PM   simethicone (MYLICON) 80 MG chewable tablet Chew 80 mg by mouth daily as  needed (bloating).   No facility-administered encounter medications on file as of 10/03/2022.     Objective:    PHYSICAL EXAMINATION:    VITALS:   Vitals:   10/03/22 1046  BP: (!) 156/80  Pulse: 60  SpO2: 99%  Weight: 166 lb 3.2 oz (75.4 kg)  Height: 5\' 6"  (1.676 m)     GEN:  The patient appears stated age and is in NAD. HEENT:  Normocephalic, atraumatic.  The mucous membranes are moist. The superficial temporal arteries are without ropiness or tenderness.    Neurological examination:  Orientation: The patient is alert and oriented x3. Cranial nerves: There is good facial symmetry. The speech is fluent and clear. Soft palate rises symmetrically and there is no tongue deviation. Hearing is intact to conversational  tone. Sensation: Sensation is intact to light touch throughout Motor: Strength is at least antigravity x4.  Movement examination: Tone: There is normal tone in the UE/LE Abnormal movements: No rest tremor.  Min postural tremor on the R.  She has mild trouble with archimedes spirals, R>L Coordination:  There is no decremation with RAM's Gait and Station: The patient ambulates well. I have reviewed and interpreted the following labs independently   Chemistry      Component Value Date/Time   NA 139 03/30/2022 1127   K 3.7 03/30/2022 1127   CL 105 03/30/2022 1127   CO2 27 03/30/2022 1127   BUN 10 03/30/2022 1127   CREATININE 0.90 06/18/2022 1133      Component Value Date/Time   CALCIUM 9.3 03/30/2022 1127   ALKPHOS 75 04/07/2010 1931   AST 20 04/07/2010 1931   ALT 15 04/07/2010 1931   BILITOT 0.6 04/07/2010 1931      Lab Results  Component Value Date   WBC 4.5 03/30/2022   HGB 12.7 03/30/2022   HCT 39.3 03/30/2022   MCV 90.3 03/30/2022   PLT 254 03/30/2022   No results found for: "TSH"   Chemistry      Component Value Date/Time   NA 139 03/30/2022 1127   K 3.7 03/30/2022 1127   CL 105 03/30/2022 1127   CO2 27 03/30/2022 1127   BUN 10 03/30/2022 1127   CREATININE 0.90 06/18/2022 1133      Component Value Date/Time   CALCIUM 9.3 03/30/2022 1127   ALKPHOS 75 04/07/2010 1931   AST 20 04/07/2010 1931   ALT 15 04/07/2010 1931   BILITOT 0.6 04/07/2010 1931       Cc:  Tisovec, Fransico Him, MD

## 2022-10-03 ENCOUNTER — Other Ambulatory Visit: Payer: Self-pay | Admitting: Neurology

## 2022-10-03 ENCOUNTER — Ambulatory Visit: Payer: Medicare HMO | Admitting: Neurology

## 2022-10-03 ENCOUNTER — Encounter: Payer: Self-pay | Admitting: Neurology

## 2022-10-03 VITALS — BP 130/70 | HR 60 | Ht 66.0 in | Wt 166.2 lb

## 2022-10-03 DIAGNOSIS — G250 Essential tremor: Secondary | ICD-10-CM

## 2022-10-03 MED ORDER — PRIMIDONE 50 MG PO TABS: ORAL_TABLET | ORAL | 2 refills | Status: DC

## 2022-10-03 NOTE — Patient Instructions (Signed)
Good to see you!  Happy [name]!  The physicians and staff at [facility] are committed to providing excellent care. You may receive a survey requesting feedback about your experience at our office. We strive to receive "very good" responses to the survey questions. If you feel that your experience would prevent you from giving the office a "very good " response, please contact our office to try to remedy the situation. We may be reached at [phone]. Thank you for taking the time out of your busy day to complete the survey.

## 2022-10-18 DIAGNOSIS — M546 Pain in thoracic spine: Secondary | ICD-10-CM | POA: Diagnosis not present

## 2022-10-18 DIAGNOSIS — M9901 Segmental and somatic dysfunction of cervical region: Secondary | ICD-10-CM | POA: Diagnosis not present

## 2022-10-18 DIAGNOSIS — M5442 Lumbago with sciatica, left side: Secondary | ICD-10-CM | POA: Diagnosis not present

## 2022-10-18 DIAGNOSIS — M542 Cervicalgia: Secondary | ICD-10-CM | POA: Diagnosis not present

## 2022-10-18 DIAGNOSIS — M9902 Segmental and somatic dysfunction of thoracic region: Secondary | ICD-10-CM | POA: Diagnosis not present

## 2022-10-18 DIAGNOSIS — M9903 Segmental and somatic dysfunction of lumbar region: Secondary | ICD-10-CM | POA: Diagnosis not present

## 2022-10-21 DIAGNOSIS — H5213 Myopia, bilateral: Secondary | ICD-10-CM | POA: Diagnosis not present

## 2022-10-22 ENCOUNTER — Encounter: Payer: Self-pay | Admitting: Cardiology

## 2022-10-22 ENCOUNTER — Ambulatory Visit: Payer: Medicare HMO | Admitting: Cardiology

## 2022-10-22 VITALS — BP 138/85 | HR 56 | Resp 16 | Ht 66.0 in | Wt 165.0 lb

## 2022-10-22 DIAGNOSIS — R0609 Other forms of dyspnea: Secondary | ICD-10-CM | POA: Diagnosis not present

## 2022-10-22 DIAGNOSIS — R002 Palpitations: Secondary | ICD-10-CM | POA: Diagnosis not present

## 2022-10-22 DIAGNOSIS — G252 Other specified forms of tremor: Secondary | ICD-10-CM

## 2022-10-22 DIAGNOSIS — I10 Essential (primary) hypertension: Secondary | ICD-10-CM

## 2022-10-22 MED ORDER — PROPRANOLOL HCL ER 80 MG PO CP24: 80.0000 mg | ORAL_CAPSULE | Freq: Every day | ORAL | 2 refills | Status: DC

## 2022-10-22 NOTE — Progress Notes (Signed)
Primary Physician/Referring:  Gaspar Garbe, MD  Patient ID: [name], female    DOB: 1945-10-02, 76 y.o.   MRN: [id number]  Chief Complaint  Patient presents with  . Paroxysmal tachycardia  . Musculoskeletal CP  . New Patient (Initial Visit)    Referred by Dr. Guerry Bruin   HPI:    [name]  is a 77 y.o. 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  ***  Past Medical History:  Diagnosis Date  . Anxiety   . Arthritis   . Bronchiolitis    acute  . Chronic back pain   . Depression   . Diverticulosis   . GERD (gastroesophageal reflux disease)    negative H pylori stool Ag  . High cholesterol   . HTN (hypertension)   . Hyperplastic colon polyp 11/08/08  . IBS (irritable bowel syndrome)   . Tubular adenoma of colon   . Umbilical hernia   . Wears glasses    Past Surgical History:  Procedure Laterality Date  . ABDOMINAL HYSTERECTOMY  1985  . APPENDECTOMY    . BIOPSY  05/09/2016   Procedure: BIOPSY;  Surgeon: Corbin Ade, MD;  [facility]: [facility];  Service: Endoscopy;;  bx ulcer at ileocecal valve  . BREAST SURGERY  1989   both sides  . CARPAL TUNNEL RELEASE Right 08/23/2013   Procedure: RIGHT ENDOSCOPIC CARPAL  TUNNEL RELEASE;  Surgeon: Jodi Marble, MD;  Location: [facility];  Service: Orthopedics;  Laterality: Right;  . CATARACT EXTRACTION, BILATERAL    . CERVICAL DISC SURGERY  2007  . CHOLECYSTECTOMY  1980  . COLONOSCOPY  11/08/08   Dr. Jena Gauss- hemorrhoids, pancolonic diverticula,,hyperplastic polyp  . COLONOSCOPY   08/05/2005   NFA:[id number] rectum/Diminutive polyp at 30 cm/Pancolonic diverticula/benign-appearing ulcer in the mid descending colon  . COLONOSCOPY N/A 03/30/2013   VHQ:IONGEXB anal canal hemorrhoids. Single colonic polyp-removed (tubular  adenoma). Colonic diverticulosis.  . COLONOSCOPY N/A 05/09/2016   Dr. Jena Gauss: diverticulosis in entire colon, mucosal ulceration at IC valve felt to be related to NSAID effect, otherwise normal. due for surveillance in 2022.   [name] COLONOSCOPY WITH PROPOFOL N/A 11/02/2020   Procedure: COLONOSCOPY WITH PROPOFOL;  Surgeon: Corbin Ade, MD;  Location: [facility];  Service: Endoscopy;  Laterality: N/A;  AM  . DORSAL COMPARTMENT RELEASE Left 12/12/2014   Procedure: LEFT WRIST RELEASE DORSAL COMPARTMENT (DEQUERVAIN);  Surgeon: Mack Hook, MD;  Location: [facility];  Service: Orthopedics;  Laterality: Left;  . ECTOPIC PREGNANCY SURGERY     bilat  . FOOT SURGERY Right 02/2017  . NEPHRECTOMY  1975   left-infection  . POLYPECTOMY  11/02/2020   Procedure: POLYPECTOMY INTESTINAL;  Surgeon: Corbin Ade, MD;  Location: [facility];  Service: Endoscopy;;  . TRIGGER FINGER RELEASE Right 08/23/2013   Procedure: RIGHT INDEX FINGER TRIGGER RELEASE ;  Surgeon: Jodi Marble, MD;  Location: [facility];  Service: Orthopedics;  Laterality: Right;   Family History  Problem Relation Age of Onset  . Cancer Brother 60       gastric  . Heart attack Mother   . Colon cancer Neg Hx   . Colon polyps Neg Hx     Social History   Tobacco Use  . Smoking status: Former    Packs/day: 0.25  Years: 30.00    Additional pack years: 0.00    Total pack years: 7.50    Types: Cigarettes    Quit date: 08/19/2000    Years since quitting: 22.1  . Smokeless tobacco: Never  . Tobacco comments:    "smoked but not inhaled"  Substance Use Topics  . Alcohol use: No    Alcohol/week: 0.0 standard drinks of alcohol   Marital Status: Widowed  ROS  ***ROS Objective      10/22/2022    1:25 PM 10/22/2022    1:18 PM 10/03/2022   11:28 AM  Vitals with BMI  Height  5\' 6"    Weight  165 lbs   BMI  26.64   Systolic 138 166 161  Diastolic 85 89 70  Pulse 56 65    SpO2: 95 %    ***Physical Exam  Laboratory examination:   Recent Labs    03/30/22 1127 06/18/22 1133  NA 139  --   K 3.7  --   CL 105  --   CO2 27  --   GLUCOSE 83  --   BUN 10  --   CREATININE 0.90 0.90  CALCIUM 9.3  --   GFRNONAA >60  --     Lab Results  Component Value Date   GLUCOSE 83 03/30/2022   NA 139 03/30/2022   K 3.7 03/30/2022   CL 105 03/30/2022   CO2 27 03/30/2022   BUN 10 03/30/2022   CREATININE 0.90 06/18/2022   GFRNONAA >60 03/30/2022   CALCIUM 9.3 03/30/2022   PROT 7.4 04/07/2010   ALBUMIN 4.3 04/07/2010   BILITOT 0.6 04/07/2010   ALKPHOS 75 04/07/2010   AST 20 04/07/2010   ALT 15 04/07/2010   ANIONGAP 7 03/30/2022      Lab Results  Component Value Date   ALT 15 04/07/2010   AST 20 04/07/2010   ALKPHOS 75 04/07/2010   BILITOT 0.6 04/07/2010       Latest Ref Rng & Units 04/07/2010    7:31 PM 03/07/2009   12:00 AM 07/14/2008   12:32 AM  Hepatic Function  Total Protein 6.0 - 8.3 g/dL 7.4  7.9  7.7   Albumin 3.5 - 5.2 g/dL 4.3  4.5  4.4   AST 0 - 37 units/L 20  15  14    ALT 0 - 35 units/L 15  13  11    Alk Phosphatase 39 - 117 units/L 75  93  98   Total Bilirubin 0.3 - 1.2 mg/dL 0.6   1.0   Bilirubin, Direct 0.0 - 0.3 mg/dL 0.1   0.2    External labs:   Labs 04/25/2022:  Serum glucose 86 mg, BUN 14, creatinine 0.9, EGFR 60 mill, potassium 4.2, LFTs normal.  Total cholesterol 253, triglycerides 70, HDL 89, LDL 150.  Non-HDL cholesterol 164.  Hb 13.5/HCT 38.0, platelets 213, normal indicis.  Albumin to creatinine ratio of 29.  TSH 0.580 10/01/2016  Radiology:   CT of the chest and abdomen with contrast 06/18/2022: 1. No acute intrathoracic or intra-abdominal pathology identified. No definite radiographic explanation for the patient's reported symptoms. 2. Mild coronary artery calcification. Morphologic changes in keeping with pulmonary arterial hypertension. 3. Mild emphysema. 4. Status post left nephrectomy. 5. Moderate colonic diverticulosis  without superimposed acute inflammatory change. 6. Stable small left lateral fat containing abdominal wall hernia likely representing an incisional hernia related to thoracoabdominal approach to left nephrectomy. 7. Cardiovascular: Mild coronary artery calcification. Cardiac size within normal limits. No  pericardial effusion. Central pulmonary arteries are mildly enlarged in keeping with changes of pulmonary arterial hypertension. The thoracic aorta is unremarkable.  Cardiac Studies:   Echocardiogram 06/26/2018:  1. Left ventricle cavity is normal in size. Moderate concentric hypertrophy of the left ventricle. Normal global wall motion. Calculated EF 58%. 2. Mild tricuspid regurgitation. 3. Compared to previous study in 2017, RV hypertrophy, pulmonary hypertension not seen in this study.  EKG:   EKG 10/22/2022: Sinus bradycardia at rate of 55 bpm, LAE, normal axis, incomplete right bundle branch block.  LVH.  Early repolarization.  No significant change from prior EKG.  EKG 03/30/2022: Sinus bradycardia at rate of 55 bpm, LVH.  Early repolarization.  Medications and allergies   Allergies  Allergen Reactions  . Morphine And Related Hypertension     Medication list   Current Outpatient Medications:  .  albuterol (VENTOLIN HFA) 108 (90 Base) MCG/ACT inhaler, INHALE 1 TO 2 PUFFS BY MOUTH FOUR TIMES DAILY AS NEEDED AS DIRECTED FOR DIFFICULT BREATHING OR WHEEZING, Disp: , Rfl:  .  amLODipine-valsartan (EXFORGE) 5-160 MG per tablet, Take 1 tablet by mouth daily., Disp: , Rfl:  .  Carboxymeth-Glyc-Polysorb PF (REFRESH DIGITAL PF) 0.5-1-0.5 % SOLN, Place 1 drop into both eyes 3 (three) times daily as needed (dry eyes)., Disp: , Rfl:  .  Cholecalciferol (VITAMIN D) 50 MCG (2000 UT) tablet, Take 2,000 Units by mouth daily., Disp: , Rfl:  .  escitalopram (LEXAPRO) 10 MG tablet, Take 10 mg by mouth daily., Disp: , Rfl:  .  fluvastatin (LESCOL) 20 MG capsule, Take 20 mg by mouth 3 (three) times a  week., Disp: , Rfl:  .  furosemide (LASIX) 20 MG tablet, Take 20 mg by mouth daily., Disp: , Rfl:  .  Multiple Vitamins-Minerals (MULTIVITAMIN ADULT) CHEW, Chew 2 each by mouth daily., Disp: , Rfl:  .  OVER THE COUNTER MEDICATION, Uses smooth move and prunes for constipation, Disp: , Rfl:  .  primidone (MYSOLINE) 50 MG tablet, TAKE 3 TABS IN THE AM AND 1 TAB IN THE PM, Disp: 360 tablet, Rfl: 2 .  propranolol ER (INDERAL LA) 80 MG 24 hr capsule, Take 1 capsule (80 mg total) by mouth daily., Disp: 30 capsule, Rfl: 2 .  simethicone (MYLICON) 80 MG chewable tablet, Chew 80 mg by mouth daily as needed (bloating)., Disp: , Rfl:   Assessment     ICD-10-CM   1. Palpitations  R00.2 EKG 12-Lead    propranolol ER (INDERAL LA) 80 MG 24 hr capsule    PCV CARDIAC STRESS TEST    CANCELED: LONG TERM MONITOR (3-14 DAYS)    2. Dyspnea on exertion  R06.09 PCV ECHOCARDIOGRAM COMPLETE    PCV CARDIAC STRESS TEST    3. Hypercholesteremia  E78.00     4. Coarse tremors  G25.2 propranolol ER (INDERAL LA) 80 MG 24 hr capsule    5. Primary hypertension  I10 propranolol ER (INDERAL LA) 80 MG 24 hr capsule       Orders Placed This Encounter  Procedures  . PCV CARDIAC STRESS TEST    Standing Status:   Future    Standing Expiration Date:   12/22/2022  . EKG 12-Lead  . PCV ECHOCARDIOGRAM COMPLETE    Standing Status:   Future    Standing Expiration Date:   10/22/2023    Meds ordered this encounter  Medications  . propranolol ER (INDERAL LA) 80 MG 24 hr capsule    Sig: Take 1 capsule (80 mg total)  by mouth daily.    Dispense:  30 capsule    Refill:  2    Discontinue Metoprolol    Medications Discontinued During This Encounter  Medication Reason  . HYDROcodone-acetaminophen (NORCO/VICODIN) 5-325 MG tablet Patient Preference  . omeprazole (PRILOSEC) 20 MG capsule   . Biotin 1000 MCG tablet   . co-enzyme Q-10 30 MG capsule   . Menthol, Topical Analgesic, (BIOFREEZE) 4 % GEL   . metoprolol tartrate  (LOPRESSOR) 25 MG tablet Change in therapy     Recommendations:   [name] is a 77 y.o. 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    1. Palpitations Patient's symptoms of palpitations are related to extreme sensitivity to increased heart rate when she stands up or when she walks.  I was able to reproduce her symptoms, EKG does not reveal any significant arrhythmias and she is in sinus rhythm except heart rate from baseline bradycardia goes up and she is symptomatic and feels like she could hear her heartbeat in her neck and chest.  Although she has bradycardia at baseline, 77 years of age, I have started her on propranolol ER - EKG 12-Lead - propranolol ER (INDERAL LA) 80 MG 24 hr capsule; Take 1 capsule (80 mg total) by mouth daily.  Dispense: 30 capsule; Refill: 2 - PCV CARDIAC STRESS TEST; Future  2. Dyspnea on exertion *** - PCV ECHOCARDIOGRAM COMPLETE; Future - PCV CARDIAC STRESS TEST; Future  3. Primary hypertension *** - propranolol ER (INDERAL LA) 80 MG 24 hr capsule; Take 1 capsule (80 mg total) by mouth daily.  Dispense: 30 capsule; Refill: 2  4. Coarse tremors *** - propranolol ER (INDERAL LA) 80 MG 24 hr capsule; Take 1 capsule (80 mg total) by mouth daily.  Dispense: 30 capsule; Refill: 2     Alice Decamp, MD, [facility] 10/22/2022, 2:29 PM Office: [phone]

## 2022-10-22 NOTE — Patient Instructions (Signed)
Please hold propranolol 1 day before the stress test and the day after the stress test.  He can take it after the stress test.

## 2022-10-23 DIAGNOSIS — M5442 Lumbago with sciatica, left side: Secondary | ICD-10-CM | POA: Diagnosis not present

## 2022-10-23 DIAGNOSIS — M9902 Segmental and somatic dysfunction of thoracic region: Secondary | ICD-10-CM | POA: Diagnosis not present

## 2022-10-23 DIAGNOSIS — M546 Pain in thoracic spine: Secondary | ICD-10-CM | POA: Diagnosis not present

## 2022-10-23 DIAGNOSIS — M542 Cervicalgia: Secondary | ICD-10-CM | POA: Diagnosis not present

## 2022-10-23 DIAGNOSIS — M9901 Segmental and somatic dysfunction of cervical region: Secondary | ICD-10-CM | POA: Diagnosis not present

## 2022-10-23 DIAGNOSIS — M9903 Segmental and somatic dysfunction of lumbar region: Secondary | ICD-10-CM | POA: Diagnosis not present

## 2022-10-28 DIAGNOSIS — I10 Essential (primary) hypertension: Secondary | ICD-10-CM | POA: Diagnosis not present

## 2022-10-28 DIAGNOSIS — E663 Overweight: Secondary | ICD-10-CM | POA: Diagnosis not present

## 2022-10-28 DIAGNOSIS — I700 Atherosclerosis of aorta: Secondary | ICD-10-CM | POA: Diagnosis not present

## 2022-10-28 DIAGNOSIS — M791 Myalgia, unspecified site: Secondary | ICD-10-CM | POA: Diagnosis not present

## 2022-10-28 DIAGNOSIS — J449 Chronic obstructive pulmonary disease, unspecified: Secondary | ICD-10-CM | POA: Diagnosis not present

## 2022-10-28 DIAGNOSIS — R002 Palpitations: Secondary | ICD-10-CM | POA: Diagnosis not present

## 2022-10-28 DIAGNOSIS — E7800 Pure hypercholesterolemia, unspecified: Secondary | ICD-10-CM | POA: Diagnosis not present

## 2022-10-28 DIAGNOSIS — Z905 Acquired absence of kidney: Secondary | ICD-10-CM | POA: Diagnosis not present

## 2022-11-11 ENCOUNTER — Encounter: Payer: Self-pay | Admitting: Gastroenterology

## 2022-11-26 ENCOUNTER — Ambulatory Visit: Payer: Medicare HMO | Admitting: Gastroenterology

## 2022-11-26 ENCOUNTER — Encounter: Payer: Self-pay | Admitting: Gastroenterology

## 2022-11-26 VITALS — BP 136/79 | HR 56 | Temp 97.7°F | Ht 66.0 in | Wt 163.8 lb

## 2022-11-26 DIAGNOSIS — K582 Mixed irritable bowel syndrome: Secondary | ICD-10-CM | POA: Diagnosis not present

## 2022-11-26 MED ORDER — HYOSCYAMINE SULFATE 0.125 MG PO TABS: 0.1250 mg | ORAL_TABLET | ORAL | 0 refills | Status: DC | PRN

## 2022-11-26 NOTE — Progress Notes (Signed)
GI Office Note    Referring Provider: Tisovec, Adelfa Koh, MD Primary Care Physician:  Gaspar Garbe, MD  Primary Gastroenterologist: Roetta Sessions, MD   Chief Complaint   Chief Complaint  Patient presents with   Abdominal Pain    Upset stomach. Sometimes diarrhea.     History of Present Illness   [name] is a 77 y.o. female presenting today for follow-up.  Patient last seen May 2023.  History of GERD, IBS-C.  Also has history of left lateral abdominal wall hernia status post nephrectomy in the remote past due to infection  No GERD issues. No N/V. No abdominal pain. Continues to have intermittent bowel issues. At baseline has constipation but at times will have intermittent diarrhea she feels could be related to food intake. She has diarrhea typically with dairy based products especially milk, creamer. She has not been able to find her dairy free creamer anymore. So she will typically use creamer in her coffee but if she plans to leave the house she has to skip her coffee. Recently had mashed potatoes at a social gathering and she has urgency and diarrhea, likely due to milk in the potatoes. Will usually take Pepto if diarrhea starts, and after one or two doses, then has constipation. Stress seems to aggravate bowels. If she has to rush or get in hurry, tends to aggravate bowels. Can control symptoms by allowing herself plenty of time. Sometimes has 3-4 stools in short period of time, especially in the mornings. No recent antibiotic use.   Has never tried levsin or bentyl.    Colonoscopy April 2022: -Non-bleeding internal hemorrhoids. -Two 4 to 6 mm polyps at the hepatic flexure, removed with a cold snare. Resected and retrieved. -Diverticulosis in the entire examined colon. Normal-appearing TI -The examination was otherwise normal on direct and retroflexion views. -Tubular adenomas noted on path -Surveillance colonoscopy can be considered in 5 years if health  permits.  CT chest abdomen pelvis with contrast December 2023: IMPRESSION: 1. No acute intrathoracic or intra-abdominal pathology identified. No definite radiographic explanation for the patient's reported symptoms. 2. Mild coronary artery calcification. Morphologic changes in keeping with pulmonary arterial hypertension. 3. Mild emphysema. 4. Status post left nephrectomy. 5. Moderate colonic diverticulosis without superimposed acute inflammatory change. 6. Stable small left lateral fat containing abdominal wall hernia likely representing an incisional hernia related to thoracoabdominal approach to left nephrectomy.  Medications   Current Outpatient Medications  Medication Sig Dispense Refill   albuterol (VENTOLIN HFA) 108 (90 Base) MCG/ACT inhaler INHALE 1 TO 2 PUFFS BY MOUTH FOUR TIMES DAILY AS NEEDED AS DIRECTED FOR DIFFICULT BREATHING OR WHEEZING     amLODipine-valsartan (EXFORGE) 5-160 MG per tablet Take 1 tablet by mouth daily.     Carboxymeth-Glyc-Polysorb PF (REFRESH DIGITAL PF) 0.5-1-0.5 % SOLN Place 1 drop into both eyes 3 (three) times daily as needed (dry eyes).     Cholecalciferol (VITAMIN D) 50 MCG (2000 UT) tablet Take 2,000 Units by mouth daily.     escitalopram (LEXAPRO) 10 MG tablet Take 10 mg by mouth daily.     fluvastatin (LESCOL) 20 MG capsule Take 20 mg by mouth 3 (three) times a week.     furosemide (LASIX) 20 MG tablet Take 20 mg by mouth daily.     Multiple Vitamins-Minerals (MULTIVITAMIN ADULT) CHEW Chew 2 each by mouth daily.     primidone (MYSOLINE) 50 MG tablet TAKE 3 TABS IN THE AM AND 1 TAB IN THE  PM 360 tablet 2   propranolol ER (INDERAL LA) 80 MG 24 hr capsule Take 1 capsule (80 mg total) by mouth daily. 30 capsule 2   simethicone (MYLICON) 80 MG chewable tablet Chew 80 mg by mouth daily as needed (bloating).     No current facility-administered medications for this visit.    Allergies   Allergies as of 11/26/2022 - Review Complete 11/26/2022   Allergen Reaction Noted   Morphine and codeine Hypertension 11/11/2013       Review of Systems   General: Negative for anorexia, weight loss, fever, chills, fatigue, weakness. ENT: Negative for hoarseness, difficulty swallowing , nasal congestion. CV: Negative for chest pain, angina,  dyspnea on exertion, peripheral edema. +palpitations followed by cardiology Respiratory: Negative for dyspnea at rest, dyspnea on exertion, cough, sputum, wheezing.  GI: See history of present illness. GU:  Negative for dysuria, hematuria, urinary incontinence, urinary frequency, nocturnal urination.  Endo: Negative for unusual weight change.     Physical Exam   BP 136/79 (BP Location: Right Arm, Patient Position: Sitting, Cuff Size: Large)   Pulse (!) 56   Temp 97.7 F (36.5 C) (Temporal)   Ht 5\' 6"  (1.676 m)   Wt 163 lb 12.8 oz (74.3 kg)   BMI 26.44 kg/m    General: Well-nourished, well-developed in no acute distress.  Eyes: No icterus. Mouth: Oropharyngeal mucosa moist and pink  Lungs: Clear to auscultation bilaterally.  Heart: Regular rate and rhythm, no murmurs rubs or gallops.  Abdomen: Bowel sounds are normal, nontender, nondistended, no hepatosplenomegaly or masses,  no abdominal bruits or hernia , no rebound or guarding.  Rectal:not performed Extremities: No lower extremity edema. No clubbing or deformities. Neuro: Alert and oriented x 4   Skin: Warm and dry, no jaundice.   Psych: Alert and cooperative, normal mood and affect.  Labs   Lab Results  Component Value Date   CREATININE 0.90 06/18/2022   BUN 10 03/30/2022   NA 139 03/30/2022   K 3.7 03/30/2022   CL 105 03/30/2022   CO2 27 03/30/2022   Lab Results  Component Value Date   WBC 4.5 03/30/2022   HGB 12.7 03/30/2022   HCT 39.3 03/30/2022   MCV 90.3 03/30/2022   PLT 254 03/30/2022    Imaging Studies   No results found.  Assessment   GERD: doing well.  IBS: suspect intermittent diarrhea due to IBS and/or  dietary triggers. Appears she has issues with dairy. Would advise avoiding. Reduce stress. Try antispasmodic.   PLAN   Trial of Levsin, one qid prn abdominal cramping, loose stools, to prevent urgency while on longer car rides, at church. Hold for constipation. Call with progress report in few weeks. Otherwise if doing well, return ov in one year.    Leanna Battles. Melvyn Neth, MHS, PA-C [facility]

## 2022-11-26 NOTE — Patient Instructions (Signed)
Try taking Levsin, place one under tongue up to four times daily for frequent loose stools and abdominal cramping. You do not need to take on days you have normal stools. Don't take if constipated. You can use if you need to go for longer car rides or before church if you stomach feels upset.  Call with progress report in a couple of weeks and let me know how you are doing.  Otherwise return office visit in one year.

## 2022-11-27 DIAGNOSIS — W1839XA Other fall on same level, initial encounter: Secondary | ICD-10-CM | POA: Diagnosis not present

## 2022-11-27 DIAGNOSIS — M542 Cervicalgia: Secondary | ICD-10-CM | POA: Diagnosis not present

## 2022-11-27 DIAGNOSIS — M25562 Pain in left knee: Secondary | ICD-10-CM | POA: Diagnosis not present

## 2022-11-27 DIAGNOSIS — M25561 Pain in right knee: Secondary | ICD-10-CM | POA: Diagnosis not present

## 2022-11-27 DIAGNOSIS — M25532 Pain in left wrist: Secondary | ICD-10-CM | POA: Diagnosis not present

## 2022-11-28 DIAGNOSIS — M25512 Pain in left shoulder: Secondary | ICD-10-CM | POA: Diagnosis not present

## 2022-11-28 DIAGNOSIS — M65322 Trigger finger, left index finger: Secondary | ICD-10-CM | POA: Diagnosis not present

## 2022-11-29 ENCOUNTER — Ambulatory Visit: Payer: Medicare HMO

## 2022-11-29 DIAGNOSIS — R0609 Other forms of dyspnea: Secondary | ICD-10-CM

## 2022-11-29 DIAGNOSIS — R002 Palpitations: Secondary | ICD-10-CM

## 2022-11-29 DIAGNOSIS — R9439 Abnormal result of other cardiovascular function study: Secondary | ICD-10-CM

## 2022-12-02 NOTE — Progress Notes (Signed)
Please schedule exercise nuclear stress

## 2022-12-02 NOTE — Progress Notes (Unsigned)
PCV CARDIAC STRESS TEST 11/29/2022  Narrative Exercise treadmill stress test 12/02/2022: Exercise treadmill stress test performed using Bruce protocol.  Patient exercised for 3 minutes and 0 seconds, achieving 4.6 METS, and 92% of age predicted maximum heart rate.  Exercise capacity was low.  No chest pain reported.  Normal heart rate response. Resting hypertension 150/80 mmHg, peak stress BP 200/90 mmHg.  Stress EKG revealed no ischemic changes. Stress EKG showed sinus tachycardia, left ventricular hypertrophy, incomplete RBBB, 3 mm upsloping ST depression in leads II, III, aVF, 2 mm upsloping ST depression in leads V5,V6. These changes only partially normalize by 2 min into recovery. These changes are positive for ischemia. Abnormal exercise treadmill stress test.    ICD-10-CM   1. Abnormal stress ECG with treadmill  R94.39 PCV MYOCARDIAL PERFUSION WO LEXISCAN    2. Palpitations  R00.2 PCV CARDIAC STRESS TEST    3. Dyspnea on exertion  R06.09 PCV CARDIAC STRESS TEST    PCV MYOCARDIAL PERFUSION WO LEXISCAN     Orders Placed This Encounter  Procedures   PCV MYOCARDIAL PERFUSION WO LEXISCAN    Standing Status:   Future    Standing Expiration Date:   02/01/2023

## 2022-12-05 ENCOUNTER — Ambulatory Visit: Payer: Medicare HMO

## 2022-12-05 DIAGNOSIS — R0609 Other forms of dyspnea: Secondary | ICD-10-CM | POA: Diagnosis not present

## 2022-12-05 DIAGNOSIS — R9439 Abnormal result of other cardiovascular function study: Secondary | ICD-10-CM | POA: Diagnosis not present

## 2022-12-06 ENCOUNTER — Encounter: Payer: Self-pay | Admitting: Gastroenterology

## 2022-12-08 LAB — PCV MYOCARDIAL PERFUSION WO LEXISCAN
Angina Index: 0
ST Depression (mm): 2 mm

## 2022-12-10 ENCOUNTER — Ambulatory Visit: Payer: Medicare HMO

## 2022-12-10 DIAGNOSIS — R0609 Other forms of dyspnea: Secondary | ICD-10-CM

## 2022-12-12 ENCOUNTER — Encounter: Payer: Self-pay | Admitting: Cardiology

## 2022-12-12 ENCOUNTER — Ambulatory Visit: Payer: Medicare HMO | Admitting: Cardiology

## 2022-12-12 VITALS — BP 142/83 | HR 62 | Resp 16 | Ht 66.0 in | Wt 165.6 lb

## 2022-12-12 DIAGNOSIS — I10 Essential (primary) hypertension: Secondary | ICD-10-CM

## 2022-12-12 DIAGNOSIS — G252 Other specified forms of tremor: Secondary | ICD-10-CM

## 2022-12-12 DIAGNOSIS — R002 Palpitations: Secondary | ICD-10-CM

## 2022-12-12 DIAGNOSIS — R600 Localized edema: Secondary | ICD-10-CM

## 2022-12-12 DIAGNOSIS — R0609 Other forms of dyspnea: Secondary | ICD-10-CM

## 2022-12-12 MED ORDER — AMLODIPINE BESYLATE-VALSARTAN 5-320 MG PO TABS: 1.0000 | ORAL_TABLET | Freq: Every day | ORAL | 1 refills | Status: DC

## 2022-12-12 MED ORDER — PROPRANOLOL HCL ER 80 MG PO CP24: 80.0000 mg | ORAL_CAPSULE | Freq: Every day | ORAL | 1 refills | Status: DC

## 2022-12-12 NOTE — Progress Notes (Signed)
Primary Physician/Referring:  Gaspar Garbe, MD  Patient ID: [name], female    DOB: 10-27-1945, 77 y.o.   MRN: [id number]  Chief Complaint  Patient presents with   Palpitations   Results   Follow-up   HPI:    [name]  is a 77 y.o. 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  I had seen her 6 weeks ago.  Since making medication changes, patient has noticed improvement in dyspnea, tremors, situational anxiety and she is tolerating propranolol well.  No specific complaints otherwise, denies chest pain.  Symptoms of palpitations have also improved since being on beta-blocker that is nonselective and metoprolol was discontinued.  Past Medical History:  Diagnosis Date   Anxiety    Arthritis    Bronchiolitis    acute   Chronic back pain    Depression    Diverticulosis    GERD (gastroesophageal reflux disease)    negative H pylori stool Ag   High cholesterol    HTN (hypertension)    Hyperplastic colon polyp 11/08/08   IBS (irritable bowel syndrome)    Tubular adenoma of colon    Umbilical hernia    Wears glasses    Past Surgical History:  Procedure Laterality Date   ABDOMINAL HYSTERECTOMY  1985   APPENDECTOMY     BIOPSY  05/09/2016   Procedure: BIOPSY;  Surgeon: Corbin Ade, MD;  [facility]: [facility];  Service: Endoscopy;;  bx ulcer at ileocecal valve   BREAST SURGERY  1989   both sides   CARPAL TUNNEL RELEASE Right 08/23/2013   Procedure: RIGHT ENDOSCOPIC CARPAL  TUNNEL RELEASE;  Surgeon: Jodi Marble, MD;  Location: [facility];  Service: Orthopedics;  Laterality: Right;   CATARACT EXTRACTION, BILATERAL     CERVICAL DISC SURGERY  2007   CHOLECYSTECTOMY  1980   COLONOSCOPY  11/08/08   Dr. Jena Gauss- hemorrhoids, pancolonic  diverticula,,hyperplastic polyp   COLONOSCOPY   08/05/2005   WUJ:[id number] rectum/Diminutive polyp at 30 cm/Pancolonic diverticula/benign-appearing ulcer in the mid descending colon   COLONOSCOPY N/A 03/30/2013   NWG:NFAOZHY anal canal hemorrhoids. Single colonic polyp-removed (tubular adenoma). Colonic diverticulosis.   COLONOSCOPY N/A 05/09/2016   Dr. Jena Gauss: diverticulosis in entire colon, mucosal ulceration at IC valve felt to be related to NSAID effect, otherwise normal. due for surveillance in 2022.    COLONOSCOPY WITH PROPOFOL N/A 11/02/2020   Procedure: COLONOSCOPY WITH PROPOFOL;  Surgeon: Corbin Ade, MD;  Location: [facility];  Service: Endoscopy;  Laterality: N/A;  AM   DORSAL COMPARTMENT RELEASE Left 12/12/2014   Procedure: LEFT WRIST RELEASE DORSAL COMPARTMENT (DEQUERVAIN);  Surgeon: Mack Hook, MD;  Location: [facility];  Service: Orthopedics;  Laterality: Left;   ECTOPIC PREGNANCY SURGERY     bilat   FOOT SURGERY Right 02/2017   NEPHRECTOMY  1975   left-infection   POLYPECTOMY  11/02/2020   Procedure: POLYPECTOMY INTESTINAL;  Surgeon: Corbin Ade, MD;  Location: [facility];  Service: Endoscopy;;   TRIGGER FINGER RELEASE Right 08/23/2013   Procedure: RIGHT INDEX FINGER TRIGGER RELEASE ;  Surgeon: Jodi Marble, MD;  Location: [facility];  Service: Orthopedics;  Laterality: Right;   Family History  Problem Relation Age of Onset   Cancer Brother 57  gastric   Heart attack Mother    Colon cancer Neg Hx    Colon polyps Neg Hx     Social History   Tobacco Use   Smoking status: Former    Packs/day: 0.25    Years: 30.00    Additional pack years: 0.00    Total pack years: 7.50    Types: Cigarettes    Quit date: 08/19/2000    Years since quitting: 22.3   Smokeless tobacco: Never   Tobacco comments:    "smoked but not inhaled"  Substance Use Topics   Alcohol use: No    Alcohol/week: 0.0 standard drinks of alcohol   Marital  Status: Widowed  ROS  Review of Systems  Cardiovascular:  Positive for dyspnea on exertion and palpitations. Negative for chest pain and leg swelling.   Objective      12/12/2022    1:54 PM 11/26/2022   11:03 AM 10/22/2022    1:25 PM  Vitals with BMI  Height 5\' 6"  5\' 6"    Weight 165 lbs 10 oz 163 lbs 13 oz   BMI 26.74 26.45   Systolic 142 136 161  Diastolic 83 79 85  Pulse 62 56 56   SpO2: 98 %  Physical Exam Neck:     Vascular: No carotid bruit or JVD.  Cardiovascular:     Rate and Rhythm: Normal rate and regular rhythm.     Pulses: Intact distal pulses.     Heart sounds: Normal heart sounds. No murmur heard.    No gallop.  Pulmonary:     Effort: Pulmonary effort is normal.     Breath sounds: Normal breath sounds.  Abdominal:     General: Bowel sounds are normal.     Palpations: Abdomen is soft.  Musculoskeletal:     Right lower leg: No edema.     Left lower leg: No edema.    Laboratory examination:   Recent Labs    03/30/22 1127 06/18/22 1133  NA 139  --   K 3.7  --   CL 105  --   CO2 27  --   GLUCOSE 83  --   BUN 10  --   CREATININE 0.90 0.90  CALCIUM 9.3  --   GFRNONAA >60  --     Lab Results  Component Value Date   GLUCOSE 83 03/30/2022   NA 139 03/30/2022   K 3.7 03/30/2022   CL 105 03/30/2022   CO2 27 03/30/2022   BUN 10 03/30/2022   CREATININE 0.90 06/18/2022   GFRNONAA >60 03/30/2022   CALCIUM 9.3 03/30/2022   PROT 7.4 04/07/2010   ALBUMIN 4.3 04/07/2010   BILITOT 0.6 04/07/2010   ALKPHOS 75 04/07/2010   AST 20 04/07/2010   ALT 15 04/07/2010   ANIONGAP 7 03/30/2022      Lab Results  Component Value Date   ALT 15 04/07/2010   AST 20 04/07/2010   ALKPHOS 75 04/07/2010   BILITOT 0.6 04/07/2010       Latest Ref Rng & Units 04/07/2010    7:31 PM 03/07/2009   12:00 AM 07/14/2008   12:32 AM  Hepatic Function  Total Protein 6.0 - 8.3 g/dL 7.4  7.9  7.7   Albumin 3.5 - 5.2 g/dL 4.3  4.5  4.4   AST 0 - 37 units/L 20  15  14    ALT 0  - 35 units/L 15  13  11    Alk Phosphatase 39 - 117 units/L 75  93  98   Total Bilirubin 0.3 - 1.2 mg/dL 0.6   1.0   Bilirubin, Direct 0.0 - 0.3 mg/dL 0.1   0.2    External labs:   Labs 04/25/2022:  Serum glucose 86 mg, BUN 14, creatinine 0.9, EGFR 60 mill, potassium 4.2, LFTs normal.  Total cholesterol 253, triglycerides 70, HDL 89, LDL 150.  Non-HDL cholesterol 164.  Hb 13.5/HCT 38.0, platelets 213, normal indicis.  Albumin to creatinine ratio of 29.  TSH 0.580 10/01/2016  Radiology:   CT of the chest and abdomen with contrast 06/18/2022: 1. No acute intrathoracic or intra-abdominal pathology identified. No definite radiographic explanation for the patient's reported symptoms. 2. Mild coronary artery calcification. Morphologic changes in keeping with pulmonary arterial hypertension. 3. Mild emphysema. 4. Status post left nephrectomy. 5. Moderate colonic diverticulosis without superimposed acute inflammatory change. 6. Stable small left lateral fat containing abdominal wall hernia likely representing an incisional hernia related to thoracoabdominal approach to left nephrectomy. 7. Cardiovascular: Mild coronary artery calcification. Cardiac size within normal limits. No pericardial effusion. Central pulmonary arteries are mildly enlarged in keeping with changes of pulmonary arterial hypertension. The thoracic aorta is unremarkable.  Cardiac Studies:   PCV MYOCARDIAL PERFUSION WO LEXISCAN 12/05/2022  Narrative Images from the original result were not included. Exercise nuclear stress test 12/06/2022: Myocardial perfusion is normal. Overall LV systolic function is normal without regional wall motion abnormalities. Stress LV EF: 71%. Equivocal ECG stress. The patient exercised for 3 minutes and 0 seconds of a Bruce protocol, achieving approximately 4.64 METs & 101% of MPHR. Peak EKG revealed 2 mm horizontal ST depression of the inferolateral leads. During exercise the peak ECG  revealed no arrhythmias. ST changes resolved at 2 min into recovery. No chest pain. Dyspnea present. The heart rate response was accelerated.  The baseline blood pressure was 138/82 mmHg and increased to 260/110 mmHg, which is a hypertensive response. No change in EKG compared to 11/29/2022 GXT. EKG changes may suggest hypertensive changes. Low risk.  PCV CARDIAC STRESS TEST 11/29/2022  Narrative Exercise treadmill stress test 11/29/2022: Exercise treadmill stress test performed using Bruce protocol.  Patient exercised for 3 minutes and 0 seconds, achieving 4.6 METS, and 92% of age predicted maximum heart rate.  Exercise capacity was low.  No chest pain reported.  Normal heart rate response. Resting hypertension 150/80 mmHg, peak stress BP 200/90 mmHg. Stress EKG showed sinus tachycardia, left ventricular hypertrophy, incomplete RBBB, 3 mm upsloping ST depression in leads II, III, aVF, 2 mm upsloping ST depression in leads V5,V6. These changes only partially normalize by 2 min into recovery. These changes are positive for ischemia. Abnormal exercise treadmill stress test.  Echocardiogram 12/10/2022: Left ventricle cavity is normal in size. Mild concentric hypertrophy of the left ventricle. Normal global wall motion. Doppler evidence of grade I (impaired) diastolic dysfunction, normal LAP. Normal LV systolic function with EF 66%. Calculated EF 66%. Structurally normal trileaflet aortic valve. Trace aortic regurgitation. Structurally normal tricuspid valve. Mild tricuspid regurgitation. No evidence of pulmonary hypertension. RVSP measures 34 mmHg.   EKG:   EKG 10/22/2022: Sinus bradycardia at rate of 55 bpm, LAE, normal axis, incomplete right bundle branch block.  LVH.  Early repolarization.  No significant change from prior EKG.  EKG 03/30/2022: Sinus bradycardia at rate of 55 bpm, LVH.  Early repolarization.  Medications and allergies   Allergies  Allergen Reactions   Morphine And Codeine  Hypertension     Medication list   Current Outpatient Medications:  albuterol (VENTOLIN HFA) 108 (90 Base) MCG/ACT inhaler, INHALE 1 TO 2 PUFFS BY MOUTH FOUR TIMES DAILY AS NEEDED AS DIRECTED FOR DIFFICULT BREATHING OR WHEEZING, Disp: , Rfl:    amLODipine-valsartan (EXFORGE) 5-320 MG tablet, Take 1 tablet by mouth daily., Disp: 90 tablet, Rfl: 1   Carboxymeth-Glyc-Polysorb PF (REFRESH DIGITAL PF) 0.5-1-0.5 % SOLN, Place 1 drop into both eyes 3 (three) times daily as needed (dry eyes)., Disp: , Rfl:    Cholecalciferol (VITAMIN D) 50 MCG (2000 UT) tablet, Take 2,000 Units by mouth daily., Disp: , Rfl:    escitalopram (LEXAPRO) 10 MG tablet, Take 10 mg by mouth daily., Disp: , Rfl:    fluvastatin (LESCOL) 20 MG capsule, Take 20 mg by mouth 3 (three) times a week., Disp: , Rfl:    furosemide (LASIX) 20 MG tablet, Take 20 mg by mouth daily as needed for fluid., Disp: , Rfl:    hyoscyamine (LEVSIN) 0.125 MG tablet, Take 1 tablet (0.125 mg total) by mouth every 4 (four) hours as needed., Disp: 120 tablet, Rfl: 0   Multiple Vitamins-Minerals (MULTIVITAMIN ADULT) CHEW, Chew 2 each by mouth daily., Disp: , Rfl:    primidone (MYSOLINE) 50 MG tablet, TAKE 3 TABS IN THE AM AND 1 TAB IN THE PM, Disp: 360 tablet, Rfl: 2   rosuvastatin (CRESTOR) 5 MG tablet, Take 5 mg by mouth daily., Disp: , Rfl:    propranolol ER (INDERAL LA) 80 MG 24 hr capsule, Take 1 capsule (80 mg total) by mouth daily., Disp: 90 capsule, Rfl: 1  Assessment     ICD-10-CM   1. Dyspnea on exertion  R06.09     2. Primary hypertension  I10 amLODipine-valsartan (EXFORGE) 5-320 MG tablet    propranolol ER (INDERAL LA) 80 MG 24 hr capsule    3. Palpitations  R00.2 propranolol ER (INDERAL LA) 80 MG 24 hr capsule    4. Coarse tremors  G25.2 propranolol ER (INDERAL LA) 80 MG 24 hr capsule    5. Bilateral leg edema  R60.0      No orders of the defined types were placed in this encounter.  Meds ordered this encounter  Medications    amLODipine-valsartan (EXFORGE) 5-320 MG tablet    Sig: Take 1 tablet by mouth daily.    Dispense:  90 tablet    Refill:  1    Refills to Dr. Gerlene Burdock Tisovec   propranolol ER (INDERAL LA) 80 MG 24 hr capsule    Sig: Take 1 capsule (80 mg total) by mouth daily.    Dispense:  90 capsule    Refill:  1    Refills to Dr. Guerry Bruin   Medications Discontinued During This Encounter  Medication Reason   simethicone (MYLICON) 80 MG chewable tablet    amLODipine-valsartan (EXFORGE) 5-160 MG per tablet Dose change   propranolol ER (INDERAL LA) 80 MG 24 hr capsule Reorder     Recommendations:   [name] is a 77 y.o. 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  I had seen her 6 weeks ago.  1. Dyspnea on exertion Patient is on exertion is related to partly uncontrolled hypertension and diastolic dysfunction along with deconditioning.  She used to swim and exercise regularly until her husband passed away and she has not been doing this.  Advised her to increase  her physical activity.  I reviewed her echocardiogram and nuclear stress test, she is low risk.  She had hypertensive blood pressure response.  2. Primary hypertension Blood pressure is under better control however still needs control especially during exercise and physical activity.  I will increase the dose of the amlodipine valsartan from 5-160 mg to 5/320 mg daily, hopefully this will also help with decreasing her leg edema.  If her leg edema persist, would recommend discontinuing amlodipine, continuing valsartan but changing it to valsartan HCT to see how she would do both with regard to hypertension controlled on leg edema.  - amLODipine-valsartan (EXFORGE) 5-320 MG tablet; Take 1 tablet by mouth daily.  Dispense:  90 tablet; Refill: 1 - propranolol ER (INDERAL LA) 80 MG 24 hr capsule; Take 1 capsule (80 mg total) by mouth daily.  Dispense: 90 capsule; Refill: 1  3. Palpitations Symptoms of palpitations, coarse tremors, also generalized anxiety/situational anxiety has improved significantly since being on propranolol and I discontinued metoprolol.  This has not had any significant impact with her breathing or "COPD".  Continue the same for now.  - propranolol ER (INDERAL LA) 80 MG 24 hr capsule; Take 1 capsule (80 mg total) by mouth daily.  Dispense: 90 capsule; Refill: 1  4. Coarse tremors Are coarse tremors have improved significantly since being on propranolol ER, continue the same. - propranolol ER (INDERAL LA) 80 MG 24 hr capsule; Take 1 capsule (80 mg total) by mouth daily.  Dispense: 90 capsule; Refill: 1  5. Bilateral leg edema Her bilateral leg edema is due to probably amlodipine that is present and also probably related to increased salt intake as well.  I have discussed this with the patient.  If her edema persists, we could certainly consider discontinuing Exforge and switching her to valsartan HCT.  However with increasing dose of valsartan in the Exforge component, hopefully the leg edema will not return.  I have advised her not to take furosemide on a daily basis.  She is stable from cardiac standpoint, I will see her back on a as needed basis, I have advised her to make an appointment to see Dr. Guerry Bruin in the next 2 months or so to follow-up on blood pressure management and also leg edema.  Other orders - rosuvastatin (CRESTOR) 5 MG tablet; Take 5 mg by mouth daily.     Yates Decamp, MD, [facility] 12/12/2022, 2:26 PM Office: [phone]

## 2022-12-19 DIAGNOSIS — M9902 Segmental and somatic dysfunction of thoracic region: Secondary | ICD-10-CM | POA: Diagnosis not present

## 2022-12-19 DIAGNOSIS — M9903 Segmental and somatic dysfunction of lumbar region: Secondary | ICD-10-CM | POA: Diagnosis not present

## 2022-12-19 DIAGNOSIS — M542 Cervicalgia: Secondary | ICD-10-CM | POA: Diagnosis not present

## 2022-12-19 DIAGNOSIS — M5442 Lumbago with sciatica, left side: Secondary | ICD-10-CM | POA: Diagnosis not present

## 2022-12-19 DIAGNOSIS — M546 Pain in thoracic spine: Secondary | ICD-10-CM | POA: Diagnosis not present

## 2022-12-19 DIAGNOSIS — M9901 Segmental and somatic dysfunction of cervical region: Secondary | ICD-10-CM | POA: Diagnosis not present

## 2022-12-31 DIAGNOSIS — M65331 Trigger finger, right middle finger: Secondary | ICD-10-CM | POA: Diagnosis not present

## 2022-12-31 DIAGNOSIS — M65332 Trigger finger, left middle finger: Secondary | ICD-10-CM | POA: Diagnosis not present

## 2023-01-02 DIAGNOSIS — M545 Low back pain, unspecified: Secondary | ICD-10-CM | POA: Diagnosis not present

## 2023-02-04 DIAGNOSIS — M65332 Trigger finger, left middle finger: Secondary | ICD-10-CM | POA: Diagnosis not present

## 2023-02-04 DIAGNOSIS — M65331 Trigger finger, right middle finger: Secondary | ICD-10-CM | POA: Diagnosis not present

## 2023-02-17 ENCOUNTER — Ambulatory Visit: Payer: Medicare HMO | Admitting: Cardiology

## 2023-02-21 DIAGNOSIS — M79622 Pain in left upper arm: Secondary | ICD-10-CM | POA: Diagnosis not present

## 2023-02-21 DIAGNOSIS — N644 Mastodynia: Secondary | ICD-10-CM | POA: Diagnosis not present

## 2023-02-21 DIAGNOSIS — M791 Myalgia, unspecified site: Secondary | ICD-10-CM | POA: Diagnosis not present

## 2023-02-27 ENCOUNTER — Other Ambulatory Visit: Payer: Self-pay | Admitting: Family Medicine

## 2023-02-27 DIAGNOSIS — N644 Mastodynia: Secondary | ICD-10-CM

## 2023-04-01 ENCOUNTER — Ambulatory Visit
Admission: RE | Admit: 2023-04-01 | Discharge: 2023-04-01 | Disposition: A | Discharge status: Home / Self Care | Payer: Medicare HMO | Source: Ambulatory Visit | Attending: Family Medicine | Admitting: Family Medicine

## 2023-04-01 ENCOUNTER — Other Ambulatory Visit: Payer: Self-pay | Admitting: Family Medicine

## 2023-04-01 DIAGNOSIS — N644 Mastodynia: Secondary | ICD-10-CM

## 2023-04-01 DIAGNOSIS — M79622 Pain in left upper arm: Secondary | ICD-10-CM | POA: Diagnosis not present

## 2023-04-03 DIAGNOSIS — M65322 Trigger finger, left index finger: Secondary | ICD-10-CM | POA: Diagnosis not present

## 2023-04-25 ENCOUNTER — Ambulatory Visit: Payer: Medicare HMO | Admitting: Gastroenterology

## 2023-04-25 ENCOUNTER — Encounter: Payer: Self-pay | Admitting: Gastroenterology

## 2023-04-25 VITALS — BP 130/77 | HR 74 | Temp 98.4°F | Ht 66.0 in | Wt 167.4 lb

## 2023-04-25 DIAGNOSIS — R1032 Left lower quadrant pain: Secondary | ICD-10-CM | POA: Diagnosis not present

## 2023-04-25 DIAGNOSIS — K5732 Diverticulitis of large intestine without perforation or abscess without bleeding: Secondary | ICD-10-CM | POA: Insufficient documentation

## 2023-04-25 MED ORDER — AMOXICILLIN-POT CLAVULANATE 875-125 MG PO TABS: 1.0000 | ORAL_TABLET | Freq: Two times a day (BID) | ORAL | 0 refills | Status: AC

## 2023-04-25 NOTE — Progress Notes (Signed)
GI Office Note    Referring Provider: Tisovec, Adelfa Koh, MD Primary Care Physician:  Gaspar Garbe, MD  Primary Gastroenterologist: Roetta Sessions, MD   Chief Complaint   Chief Complaint  Patient presents with   Abdominal Pain    Pt ate some almonds and she has had abd pain and diarrhea since Wednesday    History of Present Illness   [name] is a 77 y.o. female presenting today for abdominal pain and diarrhea.  Last seen in May 2024.  She has a history of GERD, IBS-C.   Patient presents for acute visit. Symptoms started several days ago. Occurring one day after eating almonds. LLQ pain. Has been constant but worse the last 48 hours. Initially felt constipated. Stools hard. Cooked down 7 prunes and consumed. Had multiple loose stools to follow providing temporary relief in abdominal pain. Last 24 hours has been eating mashed potatoes. Stools moving. No melena, brbpr. No fever/chills. No reflux. No vomiting.   Colonoscopy April 2022: -Non-bleeding internal hemorrhoids. -Two 4 to 6 mm polyps at the hepatic flexure, removed with a cold snare. Resected and retrieved. -Diverticulosis in the entire examined colon. Normal-appearing TI -The examination was otherwise normal on direct and retroflexion views. -Tubular adenomas noted on path -Surveillance colonoscopy can be considered in 5 years if health permits.   CT chest abdomen pelvis with contrast December 2023: IMPRESSION: 1. No acute intrathoracic or intra-abdominal pathology identified. No definite radiographic explanation for the patient's reported symptoms. 2. Mild coronary artery calcification. Morphologic changes in keeping with pulmonary arterial hypertension. 3. Mild emphysema. 4. Status post left nephrectomy. 5. Moderate colonic diverticulosis without superimposed acute inflammatory change. 6. Stable small left lateral fat containing abdominal wall hernia likely representing an incisional hernia  related to thoracoabdominal approach to left nephrectomy.    Medications   Current Outpatient Medications  Medication Sig Dispense Refill   albuterol (VENTOLIN HFA) 108 (90 Base) MCG/ACT inhaler INHALE 1 TO 2 PUFFS BY MOUTH FOUR TIMES DAILY AS NEEDED AS DIRECTED FOR DIFFICULT BREATHING OR WHEEZING     amLODipine-valsartan (EXFORGE) 5-320 MG tablet Take 1 tablet by mouth daily. 90 tablet 1   Cholecalciferol (VITAMIN D) 50 MCG (2000 UT) tablet Take 2,000 Units by mouth daily.     escitalopram (LEXAPRO) 10 MG tablet Take 10 mg by mouth daily.     Multiple Vitamins-Minerals (MULTIVITAMIN ADULT) CHEW Chew 2 each by mouth daily.     primidone (MYSOLINE) 50 MG tablet TAKE 3 TABS IN THE AM AND 1 TAB IN THE PM 360 tablet 2   propranolol ER (INDERAL LA) 80 MG 24 hr capsule Take 1 capsule (80 mg total) by mouth daily. 90 capsule 1   rosuvastatin (CRESTOR) 5 MG tablet Take 5 mg by mouth daily.     furosemide (LASIX) 20 MG tablet Take 20 mg by mouth daily as needed for fluid. (Patient not taking: Reported on 04/25/2023)     No current facility-administered medications for this visit.    Allergies   Allergies as of 04/25/2023 - Review Complete 04/25/2023  Allergen Reaction Noted   Morphine and codeine Hypertension 11/11/2013        Review of Systems   General: Negative for anorexia, weight loss, fever, chills, fatigue, weakness. ENT: Negative for hoarseness, difficulty swallowing , nasal congestion. CV: Negative for chest pain, angina, palpitations, dyspnea on exertion, peripheral edema.  Respiratory: Negative for dyspnea at rest, dyspnea on exertion, cough, sputum, wheezing.  GI: See history  of present illness. GU:  Negative for dysuria, hematuria, urinary incontinence, urinary frequency, nocturnal urination.  Endo: Negative for unusual weight change.     Physical Exam   BP 130/77   Pulse 74   Temp 98.4 F (36.9 C)   Ht 5\' 6"  (1.676 m)   Wt 167 lb 6.4 oz (75.9 kg)   BMI 27.02 kg/m     General: Well-nourished, well-developed in no acute distress.  Eyes: No icterus. Mouth: Oropharyngeal mucosa moist and pink , no lesions erythema or exudate. Lungs: Clear to auscultation bilaterally.  Heart: Regular rate and rhythm, no murmurs rubs or gallops.  Abdomen: Bowel sounds are normal,   nondistended, no hepatosplenomegaly or masses,  no abdominal bruits or hernia. Moderate LLQ tenderness, no rebound. Subjective guarding. Rectal: not performed  Extremities: No lower extremity edema. No clubbing or deformities. Neuro: Alert and oriented x 4   Skin: Warm and dry, no jaundice.   Psych: Alert and cooperative, normal mood and affect.  Labs   None available  Imaging Studies   MM 3D DIAGNOSTIC MAMMOGRAM BILATERAL BREAST  Result Date: 04/01/2023 CLINICAL DATA:  Patient presents with intermittent left upper outer breast pain and left axilla pain. Axilla pain described as cramping. No reported lumps. EXAM: DIGITAL DIAGNOSTIC BILATERAL MAMMOGRAM WITH TOMOSYNTHESIS AND CAD; ULTRASOUND LEFT BREAST LIMITED TECHNIQUE: Bilateral digital diagnostic mammography and breast tomosynthesis was performed. The images were evaluated with computer-aided detection. ; Targeted ultrasound examination of the left breast was performed. COMPARISON:  Previous exam(s). ACR Breast Density Category b: There are scattered areas of fibroglandular density. FINDINGS: There are no breast masses, areas of architectural distortion, areas of significant asymmetry or suspicious calcifications. No mammographic change. Targeted ultrasound is performed, showing normal tissue in the upper outer quadrant of the left breast and left axilla. No masses. No enlarged or abnormal lymph nodes. IMPRESSION: 1. No evidence of breast malignancy. RECOMMENDATION: Screening mammogram in one year.(Code:SM-B-01Y) I have discussed the findings and recommendations with the patient. If applicable, a reminder letter will be sent to the patient  regarding the next appointment. BI-RADS CATEGORY  1: Negative. Electronically Signed   By: Amie Portland M.D.   On: 04/01/2023 11:34   [place] LIMITED ULTRASOUND INCLUDING AXILLA LEFT BREAST   Result Date: 04/01/2023 CLINICAL DATA:  Patient presents with intermittent left upper outer breast pain and left axilla pain. Axilla pain described as cramping. No reported lumps. EXAM: DIGITAL DIAGNOSTIC BILATERAL MAMMOGRAM WITH TOMOSYNTHESIS AND CAD; ULTRASOUND LEFT BREAST LIMITED TECHNIQUE: Bilateral digital diagnostic mammography and breast tomosynthesis was performed. The images were evaluated with computer-aided detection. ; Targeted ultrasound examination of the left breast was performed. COMPARISON:  Previous exam(s). ACR Breast Density Category b: There are scattered areas of fibroglandular density. FINDINGS: There are no breast masses, areas of architectural distortion, areas of significant asymmetry or suspicious calcifications. No mammographic change. Targeted ultrasound is performed, showing normal tissue in the upper outer quadrant of the left breast and left axilla. No masses. No enlarged or abnormal lymph nodes. IMPRESSION: 1. No evidence of breast malignancy. RECOMMENDATION: Screening mammogram in one year.(Code:SM-B-01Y) I have discussed the findings and recommendations with the patient. If applicable, a reminder letter will be sent to the patient regarding the next appointment. BI-RADS CATEGORY  1: Negative. Electronically Signed   By: Amie Portland M.D.   On: 04/01/2023 11:34    Assessment/Plan:   LLQ pain: suspect diverticulitis -Augmentin BID for 10 days -low fiber/low residue diet until abdominal pain resolving -call if symptoms  persistent, would recommend CT at that time -go to the ED if fever, worsening abdominal pain -once over current episode, would add back in high fiber diet.       Leanna Battles. Melvyn Neth, MHS, PA-C [facility]

## 2023-04-25 NOTE — Patient Instructions (Signed)
Start augmentin twice daily for 10 days. Avoid raw vegetables, high fiber foods for the next several days until your abdominal pain is nearly resolved.  After you complete antibiotics, and you are feeling better, you can add back fiber to your diet. Call first of the week if you are not feeling better. For persistent pain we will plan for CT scan. If your abdominal pain worsens or you develop fever, you need to go to the ER for evaluation and CT scan.

## 2023-04-26 ENCOUNTER — Emergency Department (HOSPITAL_COMMUNITY)
Admission: EM | Admit: 2023-04-26 | Discharge: 2023-04-27 | Disposition: A | Discharge status: Home / Self Care | Payer: Medicare HMO | Attending: Emergency Medicine | Admitting: Emergency Medicine

## 2023-04-26 ENCOUNTER — Other Ambulatory Visit: Payer: Self-pay

## 2023-04-26 ENCOUNTER — Emergency Department (HOSPITAL_COMMUNITY): Payer: Medicare HMO

## 2023-04-26 ENCOUNTER — Encounter (HOSPITAL_COMMUNITY): Payer: Self-pay

## 2023-04-26 DIAGNOSIS — K5732 Diverticulitis of large intestine without perforation or abscess without bleeding: Secondary | ICD-10-CM | POA: Diagnosis not present

## 2023-04-26 DIAGNOSIS — K5792 Diverticulitis of intestine, part unspecified, without perforation or abscess without bleeding: Secondary | ICD-10-CM | POA: Diagnosis not present

## 2023-04-26 DIAGNOSIS — K5730 Diverticulosis of large intestine without perforation or abscess without bleeding: Secondary | ICD-10-CM | POA: Diagnosis not present

## 2023-04-26 DIAGNOSIS — Z905 Acquired absence of kidney: Secondary | ICD-10-CM | POA: Diagnosis not present

## 2023-04-26 DIAGNOSIS — R1032 Left lower quadrant pain: Secondary | ICD-10-CM | POA: Diagnosis not present

## 2023-04-26 DIAGNOSIS — I10 Essential (primary) hypertension: Secondary | ICD-10-CM | POA: Insufficient documentation

## 2023-04-26 DIAGNOSIS — Z79899 Other long term (current) drug therapy: Secondary | ICD-10-CM | POA: Diagnosis not present

## 2023-04-26 LAB — CBC
HCT: 38.6 % (ref 36.0–46.0)
Hemoglobin: 12.3 g/dL (ref 12.0–15.0)
MCH: 29.9 pg (ref 26.0–34.0)
MCHC: 31.9 g/dL (ref 30.0–36.0)
MCV: 93.9 fL (ref 80.0–100.0)
Platelets: 228 10*3/uL (ref 150–400)
RBC: 4.11 MIL/uL (ref 3.87–5.11)
RDW: 12.5 % (ref 11.5–15.5)
WBC: 8.8 10*3/uL (ref 4.0–10.5)
nRBC: 0 % (ref 0.0–0.2)

## 2023-04-26 LAB — COMPREHENSIVE METABOLIC PANEL
ALT: 17 U/L (ref 0–44)
AST: 21 U/L (ref 15–41)
Albumin: 3.8 g/dL (ref 3.5–5.0)
Alkaline Phosphatase: 108 U/L (ref 38–126)
Anion gap: 7 (ref 5–15)
BUN: 13 mg/dL (ref 8–23)
CO2: 27 mmol/L (ref 22–32)
Calcium: 8.8 mg/dL — ABNORMAL LOW (ref 8.9–10.3)
Chloride: 102 mmol/L (ref 98–111)
Creatinine, Ser: 0.87 mg/dL (ref 0.44–1.00)
GFR, Estimated: >60 mL/min (ref >60)
Glucose, Bld: 109 mg/dL — ABNORMAL HIGH (ref 70–99)
Potassium: 3.9 mmol/L (ref 3.5–5.1)
Sodium: 136 mmol/L (ref 135–145)
Total Bilirubin: 0.7 mg/dL (ref 0.3–1.2)
Total Protein: 7.9 g/dL (ref 6.5–8.1)

## 2023-04-26 LAB — URINALYSIS, ROUTINE W REFLEX MICROSCOPIC
Bacteria, UA: NONE SEEN
Bilirubin Urine: NEGATIVE
Glucose, UA: NEGATIVE mg/dL
Hgb urine dipstick: NEGATIVE
Ketones, ur: NEGATIVE mg/dL
Nitrite: NEGATIVE
Protein, ur: NEGATIVE mg/dL
Specific Gravity, Urine: 1.018 (ref 1.005–1.030)
pH: 5 (ref 5.0–8.0)

## 2023-04-26 MED ORDER — IOHEXOL 300 MG/ML  SOLN
100.0000 mL | Freq: Once | INTRAMUSCULAR | Status: AC | PRN
  Administered 2023-04-26: 100 mL via INTRAVENOUS

## 2023-04-26 NOTE — Discharge Instructions (Signed)
Please finish the antibiotics that you were given, this will likely take the better part of 3 to 4 days to truly get a lot better.  Thankfully the CT scan does not show anything worse, just the diverticulitis.  ER for worsening symptoms otherwise see your doctor within 3 days for recheck if still hurting.

## 2023-04-26 NOTE — ED Provider Triage Note (Signed)
Emergency Medicine Provider Triage Evaluation Note  [name] , a 77 y.o. female  was evaluated in triage.  Pt complains of groin, thigh pain.  Review of Systems  Positive: Groin, thigh pain Negative: Swelling, fever, urinary symptoms  Physical Exam  BP (!) 161/85 (BP Location: Left Leg)   Pulse 68   Temp 99.3 F (37.4 C) (Oral)   Resp 14   Ht 5\' 6"  (1.676 m)   Wt 76.2 kg   SpO2 98%   BMI 27.12 kg/m  Gen:   Awake, no distress   Resp:  Normal effort  MSK:   Moves extremities without difficulty  Other:  Tender to left groin just above the inguinal fold, extending to proximal anterior thigh. No mass or bulge.   Medical Decision Making  Medically screening exam initiated at 4:37 PM.  Appropriate orders placed.  GABY THORNER was informed that the remainder of the evaluation will be completed by another provider, this initial triage assessment does not replace that evaluation, and the importance of remaining in the ED until their evaluation is complete.  Patient with left groin and thigh pain. Seen yesterday by PCP and started on abx for diverticulitis. She reports constipation and a lot of gas. Tried prunes yesterday and had several bowel movements but none today. Has had multiple abdominal surgeries. No fever, vomiting, urinary sxs.   Elpidio Anis, PA-C 04/26/23 1640

## 2023-04-26 NOTE — ED Notes (Signed)
Pt back from ct

## 2023-04-26 NOTE — ED Notes (Signed)
Patient transported to CT 

## 2023-04-26 NOTE — ED Provider Notes (Signed)
[place] EMERGENCY DEPARTMENT AT [facility] Provider Note   CSN: [id number] Arrival date & time: 04/26/23  1529     History  Chief Complaint  Patient presents with   Abdominal Pain    [name] is a 77 y.o. female.   Abdominal Pain  This patient is a 77 year old female, she presents to the hospital today with a complaint of abdominal pain which is in the left lower quadrant.  She has had this kind of pain in the past and has been diagnosed with diverticulitis.  She has also been diagnosed with irritable bowel syndrome as well.  She was actually seen at the gastroenterology office, and was told that she probably had diverticulitis yesterday.  They were considering a CT scan but decided to treat with antibiotics and see how she did.  Overnight she has done okay but today the pain was getting worse, it is left lower quadrant, not associated with vomiting or fevers, she has some chronic intermittent diarrhea which is unchanged.  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  She has been on Augmentin and has had 2 doses    Home Medications Prior to Admission medications   Medication Sig Start Date End Date Taking? Authorizing Provider  albuterol (VENTOLIN HFA) 108 (90 Base) MCG/ACT inhaler INHALE 1 TO 2 PUFFS BY MOUTH FOUR TIMES DAILY AS NEEDED AS DIRECTED FOR DIFFICULT BREATHING OR WHEEZING 05/29/20   [provider]  amLODipine-valsartan (EXFORGE) 5-320 MG tablet Take 1 tablet by mouth daily. 12/12/22   Yates Decamp, MD  amoxicillin-clavulanate (AUGMENTIN) 875-125 MG tablet Take 1 tablet by mouth 2 (two) times daily for 10 days. 04/25/23 05/05/23  Tiffany Kocher, PA-C  Cholecalciferol (VITAMIN D) 50 MCG (2000 UT) tablet Take 2,000 Units by mouth daily.    [provider]  escitalopram (LEXAPRO) 10 MG tablet Take 10 mg by mouth daily.     [provider]  furosemide (LASIX) 20 MG tablet Take 20 mg by mouth daily as needed for fluid. Patient not taking: Reported on 04/25/2023 11/13/10   [provider]  Multiple Vitamins-Minerals (MULTIVITAMIN ADULT) CHEW Chew 2 each by mouth daily.    [provider]  primidone (MYSOLINE) 50 MG tablet TAKE 3 TABS IN THE AM AND 1 TAB IN THE PM 10/03/22   Tat, Octaviano Batty, DO  propranolol ER (INDERAL LA) 80 MG 24 hr capsule Take 1 capsule (80 mg total) by mouth daily. 12/12/22   Yates Decamp, MD  rosuvastatin (CRESTOR) 5 MG tablet Take 5 mg by mouth daily. 12/09/22   [provider]      Allergies    Morphine and codeine    Review of Systems   Review of Systems  Gastrointestinal:  Positive for abdominal pain.  All other systems reviewed and are negative.   Physical Exam Updated Vital Signs BP (!) 144/94   Pulse 60   Temp 99 F (37.2 C) (Oral)   Resp 16   Ht 1.676 m (5\' 6" )   Wt 76.2 kg   SpO2 100%   BMI 27.12 kg/m  Physical Exam Vitals and nursing note reviewed.  Constitutional:      General: She is not in acute distress.    Appearance: She is well-developed.  HENT:     Head: Normocephalic and atraumatic.     Mouth/Throat:  Pharynx: No oropharyngeal exudate.  Eyes:     General: No scleral icterus.       Right eye: No discharge.        Left eye: No discharge.     Conjunctiva/sclera: Conjunctivae normal.     Pupils: Pupils are equal, round, and reactive to light.  Neck:     Thyroid: No thyromegaly.     Vascular: No JVD.  Cardiovascular:     Rate and Rhythm: Normal rate and regular rhythm.     Heart sounds: Normal heart sounds. No murmur heard.    No friction rub. No gallop.  Pulmonary:     Effort: Pulmonary effort is normal. No respiratory distress.     Breath sounds: Normal breath sounds. No wheezing or rales.  Abdominal:     General: Bowel sounds are normal. There is no distension.     Palpations: Abdomen is soft. There is no mass.      Tenderness: There is abdominal tenderness.     Comments: Focal tenderness left lower quadrant without guarding or peritoneal signs, no other abdominal tenderness  Musculoskeletal:        General: No tenderness. Normal range of motion.     Cervical back: Normal range of motion and neck supple.  Lymphadenopathy:     Cervical: No cervical adenopathy.  Skin:    General: Skin is warm and dry.     Findings: No erythema or rash.  Neurological:     Mental Status: She is alert.     Coordination: Coordination normal.  Psychiatric:        Behavior: Behavior normal.     ED Results / Procedures / Treatments   Labs (all labs ordered are listed, but only abnormal results are displayed) Labs Reviewed  COMPREHENSIVE METABOLIC PANEL - Abnormal; Notable for the following components:      Result Value   Glucose, Bld 109 (*)    Calcium 8.8 (*)    All other components within normal limits  URINALYSIS, ROUTINE W REFLEX MICROSCOPIC - Abnormal; Notable for the following components:   Leukocytes,Ua MODERATE (*)    All other components within normal limits  CBC    EKG None  Radiology No results found.  Procedures Procedures    Medications Ordered in ED Medications  iohexol (OMNIPAQUE) 300 MG/ML solution 100 mL (100 mLs Intravenous Contrast Given 04/26/23 2133)    ED Course/ Medical Decision Making/ A&P                                 Medical Decision Making Amount and/or Complexity of Data Reviewed Labs: ordered. Radiology: ordered.  Risk Prescription drug management.   The patient appears well, she is in no distress, her metabolic panel and CBC are unremarkable with normal renal function, no leukocytosis, we will proceed with CT scan to rule out further significant pathology.  Could be diverticulitis or perforation or abscess.  Patient agreeable to the plan, she is mildly hypertensive but not tachycardic or febrile  Labs: Urinalysis without findings of infection, CBC without  leukocytosis, metabolic panel without renal dysfunction or electrolyte abnormalities.  Imaging: CT scan has been performed at the time of change of shift and is not yet been interpreted.  It does appear that there is slight inflammation in the left lower quadrant that could be diverticulitis.  Records reviewed: I have reviewed the EMR, and shows that the patient had been seen by gastroenterology  yesterday and prescribed Augmentin.  ED course: Patient has been very stable throughout, at the time of change of shift care will be signed out to Dr. Judd Lien to follow-up the results of the CT scan and disposition accordingly, anticipate discharge home if CT scan confirms my suspicions without complicating factors        Final Clinical Impression(s) / ED Diagnoses Final diagnoses:  Diverticulitis    Rx / DC Orders ED Discharge Orders     None         Eber Hong, MD 04/26/23 2342

## 2023-04-26 NOTE — ED Triage Notes (Signed)
Pt comes in with LLQ/ groin pain. Pt states that she went to her PCP yesterday and she informed her it was too late for a CT scan on a Friday that if she developed worsening symptoms to come be seen in the ED. Pt report having diverticulitis hx and ate some almonds on Wednesday. Pain started yesterday worsening today. PCP put pt on an antibiotic (amoloxicin.)

## 2023-04-27 NOTE — ED Provider Notes (Signed)
  Physical Exam  BP (!) 144/94   Pulse 60   Temp 99 F (37.2 C) (Oral)   Resp 16   Ht 5\' 6"  (1.676 m)   Wt 76.2 kg   SpO2 100%   BMI 27.12 kg/m   Physical Exam Vitals and nursing note reviewed.  Constitutional:      Appearance: She is well-developed.  Pulmonary:     Effort: Pulmonary effort is normal.  Neurological:     Mental Status: She is alert and oriented to person, place, and time.     Procedures  Procedures  ED Course / MDM    Medical Decision Making Amount and/or Complexity of Data Reviewed Labs: ordered. Radiology: ordered.  Risk Prescription drug management.    Patient is a 77 year old female presenting with abdominal pain.  She has history of diverticulitis.  Patient initially seen by Dr. Hyacinth Meeker, then was signed out to me awaiting results of the CT scan.  CT has resulted and shows what appears to be early acute diverticulitis.  Patient is currently taking Augmentin that was prescribed yesterday.  I have advised her to continue this medication and feel as though she can safely be discharged.  To return as needed.  There is nothing on the CAT scan that would indicate abscess or perforation.  She is to follow-up with GI next week if not improving.      Geoffery Lyons, MD 04/27/23 [phone]

## 2023-04-28 DIAGNOSIS — Z1389 Encounter for screening for other disorder: Secondary | ICD-10-CM | POA: Diagnosis not present

## 2023-04-28 DIAGNOSIS — I10 Essential (primary) hypertension: Secondary | ICD-10-CM | POA: Diagnosis not present

## 2023-04-28 DIAGNOSIS — D638 Anemia in other chronic diseases classified elsewhere: Secondary | ICD-10-CM | POA: Diagnosis not present

## 2023-04-28 DIAGNOSIS — Z0001 Encounter for general adult medical examination with abnormal findings: Secondary | ICD-10-CM | POA: Diagnosis not present

## 2023-04-28 DIAGNOSIS — Z1212 Encounter for screening for malignant neoplasm of rectum: Secondary | ICD-10-CM | POA: Diagnosis not present

## 2023-04-28 DIAGNOSIS — E781 Pure hyperglyceridemia: Secondary | ICD-10-CM | POA: Diagnosis not present

## 2023-04-30 ENCOUNTER — Encounter: Payer: Self-pay | Admitting: Gastroenterology

## 2023-04-30 ENCOUNTER — Ambulatory Visit: Payer: Medicare HMO | Admitting: Gastroenterology

## 2023-04-30 VITALS — BP 143/78 | HR 60 | Temp 98.6°F | Ht 67.0 in | Wt 169.4 lb

## 2023-04-30 DIAGNOSIS — K5732 Diverticulitis of large intestine without perforation or abscess without bleeding: Secondary | ICD-10-CM

## 2023-04-30 NOTE — Progress Notes (Signed)
GI Office Note    Referring Provider: Tisovec, Adelfa Koh, MD Primary Care Physician:  Gaspar Garbe, MD  Primary Gastroenterologist: Roetta Sessions, MD   Chief Complaint   Chief Complaint  Patient presents with   Follow-up    Here for follow up on diverticulitis, was instructed to follow up with [place] per ER. Pt is still on augmentin.     History of Present Illness   [name] is a 77 y.o. female presenting today for follow up of diverticulitis. Seen last week with acute onset of LLQ pain.  Empirically started on Augmentin for suspected diverticulitis.  Patient had progressive abdominal pain over 24 hours prompting ED visit.  In the ED she had CT which showed findings suggestive of early acute diverticulitis.  She continues Augmentin, has completed about 5 of the 10 days prescribed.  She has added back some solid foods but being careful with her diet.  Continues low-fiber for now.  Left lower quadrant pain much improved.  Intermittent, no longer constant.  Having about 2 [place] 5 stools daily.  No nausea or vomiting.  No melena or rectal bleeding.  No fever.  Plans to pick up hyoscyamine to help with abdominal cramping.    CT A/P with contrast 04/26/23: IMPRESSION: 1. Diverticulosis of the sigmoid colon with focal area of pericolonic stranding suggesting early acute diverticulitis. No abscess. 2. Focal area of infiltration or atelectasis in the left lung base, possibly focal pneumonia. 3. Surgical absence of the left kidney.  Colonoscopy April 2022: -Non-bleeding internal hemorrhoids. -Two 4 to 6 mm polyps at the hepatic flexure, removed with a cold snare. Resected and retrieved. -Diverticulosis in the entire examined colon. Normal-appearing TI -The examination was otherwise normal on direct and retroflexion views. -Tubular adenomas noted on path -Surveillance colonoscopy can be considered in 5 years if health permits.  Medications   Current Outpatient  Medications  Medication Sig Dispense Refill   albuterol (VENTOLIN HFA) 108 (90 Base) MCG/ACT inhaler INHALE 1 TO 2 PUFFS BY MOUTH FOUR TIMES DAILY AS NEEDED AS DIRECTED FOR DIFFICULT BREATHING OR WHEEZING     amLODipine-valsartan (EXFORGE) 5-320 MG tablet Take 1 tablet by mouth daily. 90 tablet 1   amoxicillin-clavulanate (AUGMENTIN) 875-125 MG tablet Take 1 tablet by mouth 2 (two) times daily for 10 days. 20 tablet 0   Cholecalciferol (VITAMIN D) 50 MCG (2000 UT) tablet Take 2,000 Units by mouth daily.     escitalopram (LEXAPRO) 10 MG tablet Take 10 mg by mouth daily.     furosemide (LASIX) 20 MG tablet Take 20 mg by mouth daily as needed for fluid.     Multiple Vitamins-Minerals (MULTIVITAMIN ADULT) CHEW Chew 2 each by mouth daily.     primidone (MYSOLINE) 50 MG tablet TAKE 3 TABS IN THE AM AND 1 TAB IN THE PM 360 tablet 2   propranolol ER (INDERAL LA) 80 MG 24 hr capsule Take 1 capsule (80 mg total) by mouth daily. 90 capsule 1   rosuvastatin (CRESTOR) 5 MG tablet Take 5 mg by mouth daily.     SYMBICORT 160-4.5 MCG/ACT inhaler      No current facility-administered medications for this visit.    Allergies   Allergies as of 04/30/2023 - Review Complete 04/30/2023  Allergen Reaction Noted   Morphine and codeine Hypertension 11/11/2013       Review of Systems   General: Negative for anorexia, weight loss, fever, chills, fatigue, weakness. ENT: Negative for hoarseness, difficulty swallowing ,  nasal congestion. CV: Negative for chest pain, angina, palpitations, dyspnea on exertion, peripheral edema.  Respiratory: Negative for dyspnea at rest, dyspnea on exertion, cough, sputum, wheezing.  GI: See history of present illness. GU:  Negative for dysuria, hematuria, urinary incontinence, urinary frequency, nocturnal urination.  Endo: Negative for unusual weight change.     Physical Exam   BP (!) 143/78 (BP Location: Left Arm, Patient Position: Sitting, Cuff Size: Normal)   Pulse 60    Temp 98.6 F (37 C) (Oral)   Ht 5\' 7"  (1.702 m)   Wt 169 lb 6.4 oz (76.8 kg)   SpO2 96%   BMI 26.53 kg/m    General: Well-nourished, well-developed in no acute distress.  Eyes: No icterus. Mouth: Oropharyngeal mucosa moist and pink   Abdomen: Bowel sounds are normal, nondistended, no hepatosplenomegaly or masses,  no abdominal bruits or hernia , no rebound or guarding.  Rectal: not performed  Extremities: No lower extremity edema. No clubbing or deformities. Neuro: Alert and oriented x 4   Skin: Warm and dry, no jaundice.   Psych: Alert and cooperative, normal mood and affect.  Labs   Lab Results  Component Value Date   NA 136 04/26/2023   CL 102 04/26/2023   K 3.9 04/26/2023   CO2 27 04/26/2023   BUN 13 04/26/2023   CREATININE 0.87 04/26/2023   GFRNONAA >60 04/26/2023   CALCIUM 8.8 (L) 04/26/2023   ALBUMIN 3.8 04/26/2023   GLUCOSE 109 (H) 04/26/2023   Lab Results  Component Value Date   ALT 17 04/26/2023   AST 21 04/26/2023   ALKPHOS 108 04/26/2023   BILITOT 0.7 04/26/2023   Lab Results  Component Value Date   WBC 8.8 04/26/2023   HGB 12.3 04/26/2023   HCT 38.6 04/26/2023   MCV 93.9 04/26/2023   PLT 228 04/26/2023    Imaging Studies   CT ABDOMEN PELVIS W CONTRAST  Result Date: 04/26/2023 CLINICAL DATA:  Left lower quadrant abdominal pain. History of left nephrectomy. Recurrent diverticulitis. Left lower quadrant and groin pain for 2 days. EXAM: CT ABDOMEN AND PELVIS WITH CONTRAST TECHNIQUE: Multidetector CT imaging of the abdomen and pelvis was performed using the standard protocol following bolus administration of intravenous contrast. RADIATION DOSE REDUCTION: This exam was performed according to the departmental dose-optimization program which includes automated exposure control, adjustment of the mA and/or kV according to patient size and/or use of iterative reconstruction technique. CONTRAST:  OMNIPAQUE IOHEXOL 300 MG/ML  SOLN COMPARISON:  06/18/2022  FINDINGS: Lower chest: Focal area of atelectasis or infiltration in the left lung base. Possible focal pneumonia. Cardiac enlargement. Hepatobiliary: No focal liver abnormality is seen. Status post cholecystectomy. No biliary dilatation. Pancreas: Unremarkable. No pancreatic ductal dilatation or surrounding inflammatory changes. Spleen: Normal in size without focal abnormality. Adrenals/Urinary Tract: No adrenal gland nodules. Surgical absence of the left kidney. Cyst in the right kidney measuring 4.7 cm diameter. No change. No imaging follow-up is indicated. No hydronephrosis or hydroureter. Bladder is normal. Stomach/Bowel: The stomach, small bowel, and colon are not abnormally distended. Diverticulosis of the colon. There is stranding around the proximal sigmoid colon suggesting an area of acute diverticulitis. No abscess or edema. No evidence of perforation. Appendix is not identified. Vascular/Lymphatic: Aortic atherosclerosis. No enlarged abdominal or pelvic lymph nodes. Reproductive: Status post hysterectomy. No adnexal masses. Other: No free air or free fluid in the abdomen. Diffuse atrophy of the abdominal wall musculature. Musculoskeletal: Degenerative changes in the spine. Thoracolumbar scoliosis convex towards the  left. IMPRESSION: 1. Diverticulosis of the sigmoid colon with focal area of pericolonic stranding suggesting early acute diverticulitis. No abscess. 2. Focal area of infiltration or atelectasis in the left lung base, possibly focal pneumonia. 3. Surgical absence of the left kidney. Electronically Signed   By: Burman Nieves M.D.   On: 04/26/2023 23:49   MM 3D DIAGNOSTIC MAMMOGRAM BILATERAL BREAST  Result Date: 04/01/2023 CLINICAL DATA:  Patient presents with intermittent left upper outer breast pain and left axilla pain. Axilla pain described as cramping. No reported lumps. EXAM: DIGITAL DIAGNOSTIC BILATERAL MAMMOGRAM WITH TOMOSYNTHESIS AND CAD; ULTRASOUND LEFT BREAST LIMITED TECHNIQUE:  Bilateral digital diagnostic mammography and breast tomosynthesis was performed. The images were evaluated with computer-aided detection. ; Targeted ultrasound examination of the left breast was performed. COMPARISON:  Previous exam(s). ACR Breast Density Category b: There are scattered areas of fibroglandular density. FINDINGS: There are no breast masses, areas of architectural distortion, areas of significant asymmetry or suspicious calcifications. No mammographic change. Targeted ultrasound is performed, showing normal tissue in the upper outer quadrant of the left breast and left axilla. No masses. No enlarged or abnormal lymph nodes. IMPRESSION: 1. No evidence of breast malignancy. RECOMMENDATION: Screening mammogram in one year.(Code:SM-B-01Y) I have discussed the findings and recommendations with the patient. If applicable, a reminder letter will be sent to the patient regarding the next appointment. BI-RADS CATEGORY  1: Negative. Electronically Signed   By: Amie Portland M.D.   On: 04/01/2023 11:34   [place] LIMITED ULTRASOUND INCLUDING AXILLA LEFT BREAST   Result Date: 04/01/2023 CLINICAL DATA:  Patient presents with intermittent left upper outer breast pain and left axilla pain. Axilla pain described as cramping. No reported lumps. EXAM: DIGITAL DIAGNOSTIC BILATERAL MAMMOGRAM WITH TOMOSYNTHESIS AND CAD; ULTRASOUND LEFT BREAST LIMITED TECHNIQUE: Bilateral digital diagnostic mammography and breast tomosynthesis was performed. The images were evaluated with computer-aided detection. ; Targeted ultrasound examination of the left breast was performed. COMPARISON:  Previous exam(s). ACR Breast Density Category b: There are scattered areas of fibroglandular density. FINDINGS: There are no breast masses, areas of architectural distortion, areas of significant asymmetry or suspicious calcifications. No mammographic change. Targeted ultrasound is performed, showing normal tissue in the upper outer quadrant of the  left breast and left axilla. No masses. No enlarged or abnormal lymph nodes. IMPRESSION: 1. No evidence of breast malignancy. RECOMMENDATION: Screening mammogram in one year.(Code:SM-B-01Y) I have discussed the findings and recommendations with the patient. If applicable, a reminder letter will be sent to the patient regarding the next appointment. BI-RADS CATEGORY  1: Negative. Electronically Signed   By: Amie Portland M.D.   On: 04/01/2023 11:34    Assessment   Uncomplicated sigmoid colon diverticulitis -resolving -CT without complicating features -last colonoscopy 10/2020, patient not interested in pursuing colonoscopy at this time. She will consider in 10/2025 or if further problems -complete Augmentin full 10 days -use hyoscyamin qid prn abdominal cramping -gradually add back fiber as symptoms resolve, handout provided -return office visit as needed   Leanna Battles. Melvyn Neth, MHS, PA-C [facility]

## 2023-04-30 NOTE — Patient Instructions (Addendum)
Complete your antibiotics for diverticulitis. Try using hyoscyamine one under the tongue up to four times daily for abdominal pain.  See information below regarding diet for times when you do and do not have active inflammation.  Return to the office as needed.   Diverticulosis/ Diverticulitis Information and Diet:    Diverticulosis is a condition in which small, bulging pouches (diverticuli) form inside the lower part of the intestine, usually in the colon. Constipation and straining during bowel movements can worsen the condition. A diet rich in fiber can help keep stools soft and prevent inflammation.   Diverticulitis occurs when the pouches in the colon become infected or inflamed. Dietary changes can help the colon heal.   Fiber is an important part of the diet for patients with diverticulosis. A high-fiber diet softens and gives bulk to the stool, allowing it to pass quickly and easily.   Diet for Diverticulosis (WHEN YOU ARE NOT HAVING ACTIVE INFLAMMATION) Eat a high-fiber diet when you have diverticulosis. Fiber softens the stool and helps prevent constipation. It also can help decrease pressure in the colon and help prevent flare-ups of diverticulitis.   High-fiber foods include:   Beans and legumes Bran, whole wheat bread and whole grain cereals such as oatmeal Brown and wild rice Fruits such as apples, bananas and pears Vegetables such as broccoli, carrots, corn and squash Whole wheat pasta If you currently don't have a diet high in fiber, you should add fiber gradually. This helps avoid bloating and abdominal discomfort. The target is to eat 25 to 30 grams of fiber daily. Drink at least 8 cups of fluid daily. Fluid will help soften your stool. Exercise also promotes bowel movement and helps prevent constipation.   When the colon is not inflamed, eat popcorn, nuts and seeds as tolerated.   Diet for Diverticulitis (WHEN YOU HAVE ACTIVE INFLAMMATION) During flare ups of  diverticulitis, follow a clear liquid diet. Your doctor will let you know when to progress from clear liquids to low fiber solids and then back to your normal diet.   A clear liquid diet means no solid foods. Juices should have no pulp. During the clear liquid diet, you may consume:   Broth Clear juices such as apple, cranberry and grape. (Avoid orange juice) Jell-O Popsicles   When you're able to eat solid food, choose low fiber foods while healing. Low fiber foods include:   Canned or cooked fruit without seeds or skin, such as applesauce and melon Canned or well cooked vegetables without seeds and skin Dairy products such as cheese, milk and yogurt  Eggs Low-fiber cereal Meat that is ground or tender and well cooked Pasta White bread and white rice AVOID RED MEAT WHILE YOU HAVE AN ACTIVE FLARE.  AVOID NSAIDS, IBUPROFEN, ALEVE, ASPIRIN WHILE YOU HAVE AN ACTIVE FLARE.  EXERCISE AS WELL AS SMOKING CESSATION CAN HELP PREVENT RECURRENCES.    After symptoms improve, usually within two to four days, you may add 5 to 15 grams of fiber a day back into your diet. Resume your high fiber diet when you no longer have symptoms.

## 2023-05-05 DIAGNOSIS — Z23 Encounter for immunization: Secondary | ICD-10-CM | POA: Diagnosis not present

## 2023-05-05 DIAGNOSIS — Z0000 Encounter for general adult medical examination without abnormal findings: Secondary | ICD-10-CM | POA: Diagnosis not present

## 2023-05-05 DIAGNOSIS — E7800 Pure hypercholesterolemia, unspecified: Secondary | ICD-10-CM | POA: Diagnosis not present

## 2023-05-05 DIAGNOSIS — Z1339 Encounter for screening examination for other mental health and behavioral disorders: Secondary | ICD-10-CM | POA: Diagnosis not present

## 2023-05-05 DIAGNOSIS — Z1331 Encounter for screening for depression: Secondary | ICD-10-CM | POA: Diagnosis not present

## 2023-05-05 DIAGNOSIS — M791 Myalgia, unspecified site: Secondary | ICD-10-CM | POA: Diagnosis not present

## 2023-05-05 DIAGNOSIS — J449 Chronic obstructive pulmonary disease, unspecified: Secondary | ICD-10-CM | POA: Diagnosis not present

## 2023-05-05 DIAGNOSIS — I10 Essential (primary) hypertension: Secondary | ICD-10-CM | POA: Diagnosis not present

## 2023-05-05 DIAGNOSIS — R82998 Other abnormal findings in urine: Secondary | ICD-10-CM | POA: Diagnosis not present

## 2023-05-05 DIAGNOSIS — R002 Palpitations: Secondary | ICD-10-CM | POA: Diagnosis not present

## 2023-05-05 DIAGNOSIS — G8929 Other chronic pain: Secondary | ICD-10-CM | POA: Diagnosis not present

## 2023-05-05 DIAGNOSIS — E663 Overweight: Secondary | ICD-10-CM | POA: Diagnosis not present

## 2023-05-05 DIAGNOSIS — Z905 Acquired absence of kidney: Secondary | ICD-10-CM | POA: Diagnosis not present

## 2023-05-05 DIAGNOSIS — I700 Atherosclerosis of aorta: Secondary | ICD-10-CM | POA: Diagnosis not present

## 2023-05-19 ENCOUNTER — Telehealth: Payer: Self-pay

## 2023-05-19 NOTE — Telephone Encounter (Signed)
Pt called stating that she has been having left upper quadrant pain under her left breast. Pt states that she is no longer having issues with diarrhea but the pain is bothering her. Pt is wanting to know if a colonoscopy would show reasons for the pain. Please advise.

## 2023-05-20 NOTE — Telephone Encounter (Signed)
Sounds like new pain, previously LLQ pain last month, treated for diverticulitis. Recommend return ov with Dr. Jena Gauss. He may recommend colonoscopy vs repeat CT depending on her exam.

## 2023-05-21 NOTE — Telephone Encounter (Signed)
Pt was made aware and states that she will call to make an appt with Dr. Jena Gauss if she feels it is needed.

## 2023-06-10 ENCOUNTER — Other Ambulatory Visit: Payer: Self-pay

## 2023-06-10 ENCOUNTER — Telehealth: Payer: Self-pay | Admitting: Cardiology

## 2023-06-10 DIAGNOSIS — I10 Essential (primary) hypertension: Secondary | ICD-10-CM

## 2023-06-10 MED ORDER — AMLODIPINE BESYLATE-VALSARTAN 5-320 MG PO TABS: 1.0000 | ORAL_TABLET | Freq: Every day | ORAL | 1 refills | Status: DC

## 2023-06-10 NOTE — Telephone Encounter (Signed)
*  STAT* If patient is at the pharmacy, call can be transferred to refill team.   1. Which medications need to be refilled? (please list name of each medication and dose if known)  amLODipine-valsartan (EXFORGE) 5-320 MG tablet  2. Which pharmacy/location (including street and city if local pharmacy) is medication to be sent to? [id number] - [place], [place] - [address] AT [facility]  3. Do they need a 30 day or 90 day supply?   90 day supply

## 2023-06-11 DIAGNOSIS — E663 Overweight: Secondary | ICD-10-CM | POA: Diagnosis not present

## 2023-06-11 DIAGNOSIS — Z7689 Persons encountering health services in other specified circumstances: Secondary | ICD-10-CM | POA: Diagnosis not present

## 2023-06-11 DIAGNOSIS — J449 Chronic obstructive pulmonary disease, unspecified: Secondary | ICD-10-CM | POA: Diagnosis not present

## 2023-06-11 DIAGNOSIS — I10 Essential (primary) hypertension: Secondary | ICD-10-CM | POA: Diagnosis not present

## 2023-06-11 DIAGNOSIS — E785 Hyperlipidemia, unspecified: Secondary | ICD-10-CM | POA: Diagnosis not present

## 2023-06-11 DIAGNOSIS — G250 Essential tremor: Secondary | ICD-10-CM | POA: Diagnosis not present

## 2023-06-11 DIAGNOSIS — F418 Other specified anxiety disorders: Secondary | ICD-10-CM | POA: Diagnosis not present

## 2023-06-11 DIAGNOSIS — Z6826 Body mass index (BMI) 26.0-26.9, adult: Secondary | ICD-10-CM | POA: Diagnosis not present

## 2023-06-20 DIAGNOSIS — M1812 Unilateral primary osteoarthritis of first carpometacarpal joint, left hand: Secondary | ICD-10-CM | POA: Diagnosis not present

## 2023-06-20 DIAGNOSIS — M65331 Trigger finger, right middle finger: Secondary | ICD-10-CM | POA: Diagnosis not present

## 2023-06-23 ENCOUNTER — Other Ambulatory Visit: Payer: Self-pay | Admitting: Cardiology

## 2023-06-23 DIAGNOSIS — R002 Palpitations: Secondary | ICD-10-CM

## 2023-06-23 DIAGNOSIS — G252 Other specified forms of tremor: Secondary | ICD-10-CM

## 2023-06-23 DIAGNOSIS — I10 Essential (primary) hypertension: Secondary | ICD-10-CM

## 2023-06-25 ENCOUNTER — Telehealth: Payer: Self-pay | Admitting: Neurology

## 2023-06-25 ENCOUNTER — Other Ambulatory Visit: Payer: Self-pay

## 2023-06-25 MED ORDER — PRIMIDONE 50 MG PO TABS: ORAL_TABLET | ORAL | 0 refills | Status: DC

## 2023-06-25 NOTE — Telephone Encounter (Signed)
Called pt and informed her that We refilled her Primidone . She understood.

## 2023-06-25 NOTE — Telephone Encounter (Signed)
Pt wants to get  a refill sent to the [facility] in [place] for the primidone

## 2023-06-26 ENCOUNTER — Other Ambulatory Visit: Payer: Self-pay | Admitting: Neurology

## 2023-06-26 DIAGNOSIS — G250 Essential tremor: Secondary | ICD-10-CM

## 2023-06-26 NOTE — Telephone Encounter (Signed)
Called Pharmacy and they are filling the prescription and are going to text pt when it is ready.

## 2023-06-26 NOTE — Telephone Encounter (Signed)
Pt called back in stating the pharmacy told her they couldn't fill it for her until March. She is not sure what the issue is? She is getting ready to go out of town so she is wanting to get this handled.

## 2023-07-08 NOTE — Progress Notes (Deleted)
 Assessment/Plan:   1.   Essential Tremor             -continue  primidone , 50 mg, 3 tablets in the morning, 1 tablet at night  -[name] is also taking Inderal  LA, 80 mg daily by cardiology.  2.  GAD  -is driving HA and tremor.    3.  HTN  -Much improved compared to last visit but still better.  4.  F/u 9 months   Subjective:   [name] was seen today in follow up for essential tremor.  My previous records were reviewed prior to todays visit. Patient remains on primidone , 100 mg in the morning and 50 mg at night.  Overall, [name] seems to be doing well with the primidone .  Not long after our last visit, the patient saw Dr. Ladona for palpitations and he discontinued her metoprolol  and changed her to propranolol  LA, 80 mg daily.  That helped.   Current prescribed movement disorder medications: Primidone , 50 mg, 3 tablets in the morning, 1 at night Propranolol  LA, 80 mg daily   ALLERGIES:   Allergies  Allergen Reactions   Morphine And Codeine  Hypertension    CURRENT MEDICATIONS:  Outpatient Encounter Medications as of 07/10/2023  Medication Sig   albuterol  (VENTOLIN  HFA) 108 (90 Base) MCG/ACT inhaler INHALE 1 TO 2 PUFFS BY MOUTH FOUR TIMES DAILY AS NEEDED AS DIRECTED FOR DIFFICULT BREATHING OR WHEEZING   amLODipine -valsartan  (EXFORGE ) 5-320 MG tablet Take 1 tablet by mouth daily.   Cholecalciferol (VITAMIN D) 50 MCG (2000 UT) tablet Take 2,000 Units by mouth daily.   escitalopram (LEXAPRO) 10 MG tablet Take 10 mg by mouth daily.   furosemide (LASIX) 20 MG tablet Take 20 mg by mouth daily as needed for fluid.   Multiple Vitamins-Minerals (MULTIVITAMIN ADULT) CHEW Chew 2 each by mouth daily.   primidone  (MYSOLINE ) 50 MG tablet TAKE 3 TABS IN THE AM AND 1 TAB IN THE PM (ONLY WANTS 30 DAY SUPPLY. 120 TABS)   propranolol  ER (INDERAL  LA) 80 MG 24 hr capsule TAKE 1 CAPSULE BY MOUTH EVERY DAY   rosuvastatin (CRESTOR) 5 MG tablet Take 5 mg by mouth daily.   SYMBICORT 160-4.5 MCG/ACT  inhaler    No facility-administered encounter medications on file as of 07/10/2023.     Objective:    PHYSICAL EXAMINATION:    VITALS:   There were no vitals filed for this visit.    GEN:  The patient appears stated age and is in NAD. HEENT:  Normocephalic, atraumatic.  The mucous membranes are moist. The superficial temporal arteries are without ropiness or tenderness.    Neurological examination:  Orientation: The patient is alert and oriented x3. Cranial nerves: There is good facial symmetry. The speech is fluent and clear. Soft palate rises symmetrically and there is no tongue deviation. Hearing is intact to conversational tone. Sensation: Sensation is intact to light touch throughout Motor: Strength is at least antigravity x4.  Movement examination: Tone: There is normal tone in the UE/LE Abnormal movements: No rest tremor.  Min postural tremor on the R.  [name] has mild trouble with archimedes spirals, R>L Coordination:  There is no decremation with RAM's Gait and Station: The patient ambulates well. I have reviewed and interpreted the following labs independently   Chemistry      Component Value Date/Time   NA 136 04/26/2023 1639   K 3.9 04/26/2023 1639   CL 102 04/26/2023 1639   CO2 27 04/26/2023 1639  BUN 13 04/26/2023 1639   CREATININE 0.87 04/26/2023 1639      Component Value Date/Time   CALCIUM  8.8 (L) 04/26/2023 1639   ALKPHOS 108 04/26/2023 1639   AST 21 04/26/2023 1639   ALT 17 04/26/2023 1639   BILITOT 0.7 04/26/2023 1639      Lab Results  Component Value Date   WBC 8.8 04/26/2023   HGB 12.3 04/26/2023   HCT 38.6 04/26/2023   MCV 93.9 04/26/2023   PLT 228 04/26/2023   No results found for: TSH   Chemistry      Component Value Date/Time   NA 136 04/26/2023 1639   K 3.9 04/26/2023 1639   CL 102 04/26/2023 1639   CO2 27 04/26/2023 1639   BUN 13 04/26/2023 1639   CREATININE 0.87 04/26/2023 1639      Component Value Date/Time   CALCIUM   8.8 (L) 04/26/2023 1639   ALKPHOS 108 04/26/2023 1639   AST 21 04/26/2023 1639   ALT 17 04/26/2023 1639   BILITOT 0.7 04/26/2023 1639     Total time spent on today's visit was *** minutes, including both face-to-face time and nonface-to-face time.  Time included that spent on review of records (prior notes available to me/labs/imaging if pertinent), discussing treatment and goals, answering patient's questions and coordinating care.   Cc:  Tisovec, Charlie ORN, MD

## 2023-07-10 ENCOUNTER — Ambulatory Visit: Payer: Medicare HMO | Admitting: Neurology

## 2023-07-11 DIAGNOSIS — R059 Cough, unspecified: Secondary | ICD-10-CM | POA: Diagnosis not present

## 2023-07-11 DIAGNOSIS — Z87891 Personal history of nicotine dependence: Secondary | ICD-10-CM | POA: Diagnosis not present

## 2023-07-11 DIAGNOSIS — J441 Chronic obstructive pulmonary disease with (acute) exacerbation: Secondary | ICD-10-CM | POA: Diagnosis not present

## 2023-08-22 ENCOUNTER — Ambulatory Visit: Payer: Medicare HMO | Admitting: Neurology

## 2023-08-26 NOTE — Progress Notes (Unsigned)
 Virtual Visit Via Video       Consent was obtained for video visit:  {yes no:314532} Answered questions that patient had about telehealth interaction:  {yes no:314532} I discussed the limitations, risks, security and privacy concerns of performing an evaluation and management service by telemedicine. I also discussed with the patient that there may be a patient responsible charge related to this service. The patient expressed understanding and agreed to proceed.  Pt location: Home Physician Location: office Name of referring provider:  Tisovec, Adelfa Koh, MD I connected with [name] at patients initiation/request on 08/28/2023 at 10:45 AM EST by video enabled telemedicine application and verified that I am speaking with the correct person using two identifiers. Pt MRN:  [id number] Pt DOB:  Feb 19, 1946 Video Participants:  [name];  ***  Assessment/Plan:   1.   Essential Tremor             -continue  primidone, 50 mg, 3 tablets in the morning, 1 tablet at night             -She is also taking Inderal LA, 80 mg daily by cardiology.   2.  GAD             -is driving HA and tremor.     3.  HTN             -Much improved compared to last visit but still better.   4.  F/u 9 months   Subjective:   [name] was seen today in follow up for essential tremor.  My previous records were reviewed prior to todays visit.  I have not seen her in almost a year.  Patient remains on primidone, 100 mg in the morning and 50 mg at night.  Overall, she seems to be doing well with the primidone.  Not long after our last visit, the patient saw Dr. Jacinto Halim for palpitations and he discontinued her metoprolol and changed her to propranolol LA, 80 mg daily.  That helped.   Current prescribed movement disorder medications: Primidone, 50 mg, 3 tablets in the morning, 1 at night Propranolol LA, 80 mg daily   ALLERGIES:   Allergies  Allergen Reactions   Morphine And Codeine Hypertension     CURRENT MEDICATIONS:  Outpatient Encounter Medications as of 08/28/2023  Medication Sig   albuterol (VENTOLIN HFA) 108 (90 Base) MCG/ACT inhaler INHALE 1 TO 2 PUFFS BY MOUTH FOUR TIMES DAILY AS NEEDED AS DIRECTED FOR DIFFICULT BREATHING OR WHEEZING   amLODipine-valsartan (EXFORGE) 5-320 MG tablet Take 1 tablet by mouth daily.   Cholecalciferol (VITAMIN D) 50 MCG (2000 UT) tablet Take 2,000 Units by mouth daily.   escitalopram (LEXAPRO) 10 MG tablet Take 10 mg by mouth daily.   furosemide (LASIX) 20 MG tablet Take 20 mg by mouth daily as needed for fluid.   Multiple Vitamins-Minerals (MULTIVITAMIN ADULT) CHEW Chew 2 each by mouth daily.   primidone (MYSOLINE) 50 MG tablet TAKE 3 TABS IN THE AM AND 1 TAB IN THE PM (ONLY WANTS 30 DAY SUPPLY. 120 TABS)   propranolol ER (INDERAL LA) 80 MG 24 hr capsule TAKE 1 CAPSULE BY MOUTH EVERY DAY   rosuvastatin (CRESTOR) 5 MG tablet Take 5 mg by mouth daily.   SYMBICORT 160-4.5 MCG/ACT inhaler    No facility-administered encounter medications on file as of 08/28/2023.     Objective:    PHYSICAL EXAMINATION:    VITALS:   There were no vitals filed  for this visit.    GEN:  The patient appears stated age and is in NAD. HEENT:  Normocephalic, atraumatic.  The mucous membranes are moist. The superficial temporal arteries are without ropiness or tenderness.    Neurological examination:  Orientation: The patient is alert and oriented x3. Cranial nerves: There is good facial symmetry. The speech is fluent and clear. Soft palate rises symmetrically and there is no tongue deviation. Hearing is intact to conversational tone. Motor: Strength is at least antigravity x4.  Movement examination:  Abnormal movements: No rest tremor.  Min postural tremor on the R.  She has mild trouble with archimedes spirals, R>L Coordination:  There is no decremation with RAM's Gait and Station: The patient ambulates well. I have reviewed and interpreted the following  labs independently   Chemistry      Component Value Date/Time   NA 136 04/26/2023 1639   K 3.9 04/26/2023 1639   CL 102 04/26/2023 1639   CO2 27 04/26/2023 1639   BUN 13 04/26/2023 1639   CREATININE 0.87 04/26/2023 1639      Component Value Date/Time   CALCIUM 8.8 (L) 04/26/2023 1639   ALKPHOS 108 04/26/2023 1639   AST 21 04/26/2023 1639   ALT 17 04/26/2023 1639   BILITOT 0.7 04/26/2023 1639      Lab Results  Component Value Date   WBC 8.8 04/26/2023   HGB 12.3 04/26/2023   HCT 38.6 04/26/2023   MCV 93.9 04/26/2023   PLT 228 04/26/2023   No results found for: "TSH"   Chemistry      Component Value Date/Time   NA 136 04/26/2023 1639   K 3.9 04/26/2023 1639   CL 102 04/26/2023 1639   CO2 27 04/26/2023 1639   BUN 13 04/26/2023 1639   CREATININE 0.87 04/26/2023 1639      Component Value Date/Time   CALCIUM 8.8 (L) 04/26/2023 1639   ALKPHOS 108 04/26/2023 1639   AST 21 04/26/2023 1639   ALT 17 04/26/2023 1639   BILITOT 0.7 04/26/2023 1639     Follow up Instructions      -I discussed the assessment and treatment plan with the patient. The patient was provided an opportunity to ask questions and all were answered. The patient agreed with the plan and demonstrated an understanding of the instructions.   The patient was advised to call back or seek an in-person evaluation if the symptoms worsen or if the condition fails to improve as anticipated.    Total time spent on today's visit was ***minutes, including both face-to-face time and nonface-to-face time.  Time included that spent on review of records (prior notes available to me/labs/imaging if pertinent), discussing treatment and goals, answering patient's questions and coordinating care.   Kerin Salen, DO   Cc:  Tisovec, Adelfa Koh, MD

## 2023-08-28 ENCOUNTER — Encounter: Payer: Self-pay | Admitting: Neurology

## 2023-08-28 ENCOUNTER — Telehealth (INDEPENDENT_AMBULATORY_CARE_PROVIDER_SITE_OTHER): Payer: No Typology Code available for payment source | Admitting: Neurology

## 2023-08-28 VITALS — BP 174/86

## 2023-08-28 DIAGNOSIS — G250 Essential tremor: Secondary | ICD-10-CM

## 2023-08-28 DIAGNOSIS — F411 Generalized anxiety disorder: Secondary | ICD-10-CM

## 2023-08-28 DIAGNOSIS — J449 Chronic obstructive pulmonary disease, unspecified: Secondary | ICD-10-CM

## 2023-09-02 DIAGNOSIS — Z6826 Body mass index (BMI) 26.0-26.9, adult: Secondary | ICD-10-CM | POA: Diagnosis not present

## 2023-09-02 DIAGNOSIS — F411 Generalized anxiety disorder: Secondary | ICD-10-CM | POA: Diagnosis not present

## 2023-09-02 DIAGNOSIS — E663 Overweight: Secondary | ICD-10-CM | POA: Diagnosis not present

## 2023-09-02 DIAGNOSIS — Z008 Encounter for other general examination: Secondary | ICD-10-CM | POA: Diagnosis not present

## 2023-09-02 DIAGNOSIS — E785 Hyperlipidemia, unspecified: Secondary | ICD-10-CM | POA: Diagnosis not present

## 2023-09-02 DIAGNOSIS — F17211 Nicotine dependence, cigarettes, in remission: Secondary | ICD-10-CM | POA: Diagnosis not present

## 2023-09-02 DIAGNOSIS — I129 Hypertensive chronic kidney disease with stage 1 through stage 4 chronic kidney disease, or unspecified chronic kidney disease: Secondary | ICD-10-CM | POA: Diagnosis not present

## 2023-09-11 DIAGNOSIS — I10 Essential (primary) hypertension: Secondary | ICD-10-CM | POA: Diagnosis not present

## 2023-09-11 DIAGNOSIS — E785 Hyperlipidemia, unspecified: Secondary | ICD-10-CM | POA: Diagnosis not present

## 2023-09-15 DIAGNOSIS — Z87891 Personal history of nicotine dependence: Secondary | ICD-10-CM | POA: Diagnosis not present

## 2023-09-15 DIAGNOSIS — J029 Acute pharyngitis, unspecified: Secondary | ICD-10-CM | POA: Diagnosis not present

## 2023-09-18 DIAGNOSIS — I10 Essential (primary) hypertension: Secondary | ICD-10-CM | POA: Diagnosis not present

## 2023-09-18 DIAGNOSIS — E663 Overweight: Secondary | ICD-10-CM | POA: Diagnosis not present

## 2023-09-18 DIAGNOSIS — E785 Hyperlipidemia, unspecified: Secondary | ICD-10-CM | POA: Diagnosis not present

## 2023-09-18 DIAGNOSIS — Z6827 Body mass index (BMI) 27.0-27.9, adult: Secondary | ICD-10-CM | POA: Diagnosis not present

## 2023-09-18 DIAGNOSIS — F418 Other specified anxiety disorders: Secondary | ICD-10-CM | POA: Diagnosis not present

## 2023-09-18 DIAGNOSIS — I129 Hypertensive chronic kidney disease with stage 1 through stage 4 chronic kidney disease, or unspecified chronic kidney disease: Secondary | ICD-10-CM | POA: Diagnosis not present

## 2023-09-18 DIAGNOSIS — N1831 Chronic kidney disease, stage 3a: Secondary | ICD-10-CM | POA: Diagnosis not present

## 2023-09-18 DIAGNOSIS — J449 Chronic obstructive pulmonary disease, unspecified: Secondary | ICD-10-CM | POA: Diagnosis not present

## 2023-09-18 DIAGNOSIS — G250 Essential tremor: Secondary | ICD-10-CM | POA: Diagnosis not present

## 2023-09-18 DIAGNOSIS — R0609 Other forms of dyspnea: Secondary | ICD-10-CM | POA: Diagnosis not present

## 2023-09-21 ENCOUNTER — Other Ambulatory Visit: Payer: Self-pay | Admitting: Neurology

## 2023-09-21 DIAGNOSIS — G250 Essential tremor: Secondary | ICD-10-CM

## 2023-09-30 DIAGNOSIS — M65331 Trigger finger, right middle finger: Secondary | ICD-10-CM | POA: Diagnosis not present

## 2023-10-17 ENCOUNTER — Other Ambulatory Visit (HOSPITAL_COMMUNITY): Payer: Self-pay | Admitting: Nurse Practitioner

## 2023-10-17 DIAGNOSIS — Z13820 Encounter for screening for osteoporosis: Secondary | ICD-10-CM

## 2023-10-23 ENCOUNTER — Encounter: Payer: Self-pay | Admitting: Internal Medicine

## 2023-10-27 ENCOUNTER — Ambulatory Visit: Admitting: Adult Health

## 2023-10-27 ENCOUNTER — Other Ambulatory Visit (HOSPITAL_COMMUNITY)
Admission: RE | Admit: 2023-10-27 | Discharge: 2023-10-27 | Disposition: A | Discharge status: Home / Self Care | Source: Ambulatory Visit | Attending: Adult Health | Admitting: Adult Health

## 2023-10-27 ENCOUNTER — Encounter: Payer: Self-pay | Admitting: Adult Health

## 2023-10-27 ENCOUNTER — Telehealth: Payer: Self-pay | Admitting: Neurology

## 2023-10-27 VITALS — BP 132/82 | HR 61 | Ht 66.0 in | Wt 166.0 lb

## 2023-10-27 DIAGNOSIS — R309 Painful micturition, unspecified: Secondary | ICD-10-CM

## 2023-10-27 DIAGNOSIS — N898 Other specified noninflammatory disorders of vagina: Secondary | ICD-10-CM | POA: Diagnosis not present

## 2023-10-27 DIAGNOSIS — K921 Melena: Secondary | ICD-10-CM | POA: Diagnosis not present

## 2023-10-27 DIAGNOSIS — R300 Dysuria: Secondary | ICD-10-CM

## 2023-10-27 DIAGNOSIS — R1032 Left lower quadrant pain: Secondary | ICD-10-CM

## 2023-10-27 DIAGNOSIS — Z1211 Encounter for screening for malignant neoplasm of colon: Secondary | ICD-10-CM | POA: Diagnosis not present

## 2023-10-27 LAB — POCT URINALYSIS DIPSTICK
Glucose, UA: NEGATIVE
Ketones, UA: NEGATIVE
Nitrite, UA: NEGATIVE
Protein, UA: NEGATIVE

## 2023-10-27 LAB — HEMOCCULT GUIAC POC 1CARD (OFFICE): Fecal Occult Blood, POC: NEGATIVE

## 2023-10-27 MED ORDER — TERCONAZOLE 0.4 % VA CREA: 1.0000 | TOPICAL_CREAM | Freq: Every day | VAGINAL | 0 refills | Status: DC

## 2023-10-27 NOTE — Telephone Encounter (Signed)
Called spoke to patient.

## 2023-10-27 NOTE — Telephone Encounter (Signed)
 Pt called in on 10/24/23 and left message with after hours service. Pt reports she has symptoms of vertigo on Wednesday. Reports symptoms resolved, but returned this morning. Reports dizziness like the room is spinning that started while sitting down using her computer. Had vertigo years ago. Reports mild headache currently.

## 2023-10-27 NOTE — Progress Notes (Signed)
 Subjective:     Patient ID: [name], female   DOB: 29-Apr-1946, 78 y.o.   MRN: [id number]  HPI Daanya is a 78 year old black female, widowed, sp hysterectomy in complaining of pain and burning with urination for about 3-4 days and itching in vagina with some burning, for 3-4 days. She has been on Augmentin  for 14 days for throat infection, but has finished that. Had pain LLQ  at times and stool is black.  PCP is Dr Tisovec.   Review of Systems +pain and burning with urination for about 3-4 days + itching in vagina with some burning, for 3-4 days,  has been on Augmentin  for 14 days for throat infection, but has finished that.    Denies any new products and has not had sex +LLQ pain at times  Stool is black, no blood seen and has had diarrhea Reviewed past medical,surgical, social and family history. Reviewed medications and allergies.  Objective:   Physical Exam BP 132/82 (BP Location: Left Arm, Patient Position: Sitting, Cuff Size: Normal)   Pulse 61   Ht 5\' 6"  (1.676 m)   Wt 166 lb (75.3 kg)   BMI 26.79 kg/m  Urine dipstick, trace blood and leuks. Skin warm and dry.Pelvic: external genitalia is normal in appearance, no lesions seen, vagina: pale with scant white discharge without odor,urethra has no lesions or masses noted, cervix and uterus are absent,adnexa: no masses or tenderness noted. Bladder is non tender and no masses felt. CV swab obtained. On rectal exam, no masses felt and hemoccult was negative.   Fall risk is low  [facility] - 10/27/23 1348       Pregnancy Intention Screening   Does the patient want to become pregnant in the next year? N/A    Does the patient's partner want to become pregnant in the next year? N/A    Would the patient like to discuss contraceptive options today? N/A      Contraception Wrap Up   Current Method Female Sterilization   hyst   End Method Female Sterilization   hyst   Contraception Counseling Provided No            Examination  chaperoned by Alphonso Aschoff LPN  Assessment:     1. Pain with urination (Primary) Pain and burning for 3-4 days with urination UA C&S sent to rule out UTI  - POCT Urinalysis Dipstick - Urine Culture - Urinalysis, Routine w reflex microscopic  2. Burning with urination Pain and burning for 3-4 days with urination UA C&S sent to rule out UTI  - POCT Urinalysis Dipstick - Urine Culture - Urinalysis, Routine w reflex microscopic  3. Vaginal itching +itching for 3-4 days  CV swab sent for BV and yeast Will rx terazol 7  cream Meds ordered this encounter  Medications   terconazole  (TERAZOL 7 ) 0.4 % vaginal cream    Sig: Place 1 applicator vaginally at bedtime.    Dispense:  45 g    Refill:  0    Supervising Provider:   Evalyn Hillier H [[id number]]    - Cervicovaginal ancillary only( [place])  4. Stool color black Stool looks black no blood seen and has been loose Hemoccult was negative - POCT occult blood stool  5. Encounter for screening fecal occult blood testing  - POCT occult blood stool  6. LLQ pain +LLQ pain Will get pelvic US  in office to assess      Plan:     Return in about  2 weeks for pelvic US  in office

## 2023-10-28 LAB — URINALYSIS, ROUTINE W REFLEX MICROSCOPIC
Bilirubin, UA: NEGATIVE
Glucose, UA: NEGATIVE
Ketones, UA: NEGATIVE
Nitrite, UA: NEGATIVE
Protein,UA: NEGATIVE
RBC, UA: NEGATIVE
Specific Gravity, UA: 1.011 (ref 1.005–1.030)
Urobilinogen, Ur: 0.2 mg/dL (ref 0.2–1.0)
pH, UA: 6.5 (ref 5.0–7.5)

## 2023-10-28 LAB — MICROSCOPIC EXAMINATION
Bacteria, UA: NONE SEEN
Casts: NONE SEEN /LPF
RBC, Urine: NONE SEEN /HPF (ref 0–2)
WBC, UA: NONE SEEN /HPF (ref 0–5)

## 2023-10-29 DIAGNOSIS — R42 Dizziness and giddiness: Secondary | ICD-10-CM | POA: Diagnosis not present

## 2023-10-29 DIAGNOSIS — H6503 Acute serous otitis media, bilateral: Secondary | ICD-10-CM | POA: Diagnosis not present

## 2023-10-29 LAB — CERVICOVAGINAL ANCILLARY ONLY
Bacterial Vaginitis (gardnerella): POSITIVE — AB
Candida Glabrata: NEGATIVE
Candida Vaginitis: NEGATIVE
Comment: NEGATIVE
Comment: NEGATIVE
Comment: NEGATIVE

## 2023-10-31 ENCOUNTER — Telehealth: Payer: Self-pay | Admitting: Adult Health

## 2023-10-31 LAB — URINE CULTURE

## 2023-10-31 MED ORDER — SULFAMETHOXAZOLE-TRIMETHOPRIM 800-160 MG PO TABS: 1.0000 | ORAL_TABLET | Freq: Two times a day (BID) | ORAL | 0 refills | Status: DC

## 2023-10-31 MED ORDER — METRONIDAZOLE 500 MG PO TABS: 500.0000 mg | ORAL_TABLET | Freq: Two times a day (BID) | ORAL | 0 refills | Status: DC

## 2023-10-31 NOTE — Telephone Encounter (Signed)
 Pt aware that rx sent in for flagyl  and septra  ds

## 2023-11-03 ENCOUNTER — Ambulatory Visit (INDEPENDENT_AMBULATORY_CARE_PROVIDER_SITE_OTHER): Admitting: Gastroenterology

## 2023-11-03 ENCOUNTER — Encounter: Payer: Self-pay | Admitting: Gastroenterology

## 2023-11-03 VITALS — BP 134/80 | HR 60 | Temp 98.3°F | Ht 66.0 in | Wt 167.2 lb

## 2023-11-03 DIAGNOSIS — R197 Diarrhea, unspecified: Secondary | ICD-10-CM | POA: Diagnosis not present

## 2023-11-03 DIAGNOSIS — A09 Infectious gastroenteritis and colitis, unspecified: Secondary | ICD-10-CM

## 2023-11-03 NOTE — Progress Notes (Signed)
 GI Office Note    Referring Provider: Tisovec, Kristina Pfeiffer, MD Primary Care Physician:  Tisovec, Richard W, MD  Primary Gastroenterologist: Rheba Cedar, MD   Chief Complaint   Chief Complaint  Patient presents with   Follow-up    Pt here for follow up on diarrhea. Pt states its a little better    History of Present Illness   [name] is a 78 y.o. female presenting today for follow up. Last seen 04/2023. H/o diverticulitis at that time. She also has history of GERD, IBS-C.   At baseline she has constipation but will have intermittent diarrhea she feels could be related to food intake. She has diarrhea typically with dairy based products especially milk, creamer. Started having diarrhea about 3 weeks ago. She thought it may be coffee so she cut it out and no improvement. Notes having diarrhea first thing in morning before meals. Having several stools in a row, 4-5 times. Stools liquid/watery. Pepto constipated her. Taking levsin for abdominal cramping but has not had to use on regular basis. No melena, brbpr.   Notes she was on antibiotics for sore throat in March. Saw GYN last week, has pelvic u/s scheduled. Started on Bactrim  for UTI and Flagyl  for BV.   Still having LLQ pain. Can be constant. Worse with prolonged sitting. Unaffected by meals and BMs. States she may have aggravated her left side when she strained catching her sister several weeks back.   CT A/P with contrast 04/26/23: IMPRESSION: 1. Diverticulosis of the sigmoid colon with focal area of pericolonic stranding suggesting early acute diverticulitis. No abscess. 2. Focal area of infiltration or atelectasis in the left lung base, possibly focal pneumonia. 3. Surgical absence of the left kidney.  Colonoscopy April 2022: -Non-bleeding internal hemorrhoids. -Two 4 to 6 mm polyps at the hepatic flexure, removed with a cold snare. Resected and retrieved. -Diverticulosis in the entire examined colon.  Normal-appearing TI -The examination was otherwise normal on direct and retroflexion views. -Tubular adenomas noted on path -Surveillance colonoscopy can be considered in 5 years if health permits.     Medications   Current Outpatient Medications  Medication Sig Dispense Refill   albuterol  (VENTOLIN  HFA) 108 (90 Base) MCG/ACT inhaler INHALE 1 TO 2 PUFFS BY MOUTH FOUR TIMES DAILY AS NEEDED AS DIRECTED FOR DIFFICULT BREATHING OR WHEEZING     amLODipine -valsartan  (EXFORGE ) 5-320 MG tablet Take 1 tablet by mouth daily. 90 tablet 1   Cholecalciferol (VITAMIN D) 50 MCG (2000 UT) tablet Take 2,000 Units by mouth daily.     escitalopram (LEXAPRO) 10 MG tablet Take 10 mg by mouth daily.     furosemide (LASIX) 20 MG tablet Take 20 mg by mouth daily as needed for fluid.     metroNIDAZOLE  (FLAGYL ) 500 MG tablet Take 1 tablet (500 mg total) by mouth 2 (two) times daily. 14 tablet 0   Multiple Vitamins-Minerals (MULTIVITAMIN ADULT) CHEW Chew 2 each by mouth daily.     primidone  (MYSOLINE ) 50 MG tablet TAKE 3 TABS IN THE AM AND 1 TAB IN THE PM 360 tablet 0   propranolol  ER (INDERAL  LA) 80 MG 24 hr capsule TAKE 1 CAPSULE BY MOUTH EVERY DAY 30 capsule 5   rosuvastatin (CRESTOR) 5 MG tablet Take 5 mg by mouth daily.     sulfamethoxazole -trimethoprim  (BACTRIM  DS) 800-160 MG tablet Take 1 tablet by mouth 2 (two) times daily. Take 1 bid 14 tablet 0   terconazole  (TERAZOL 7 ) 0.4 % vaginal  cream Place 1 applicator vaginally at bedtime. 45 g 0   No current facility-administered medications for this visit.    Allergies   Allergies as of 11/03/2023 - Review Complete 11/03/2023  Allergen Reaction Noted   Morphine and codeine  Hypertension 11/11/2013        Review of Systems   General: Negative for anorexia, weight loss, fever, chills, fatigue, weakness. ENT: Negative for hoarseness, difficulty swallowing , nasal congestion. CV: Negative for chest pain, angina, palpitations, dyspnea on exertion, peripheral  edema.  Respiratory: Negative for dyspnea at rest, dyspnea on exertion, cough, sputum, wheezing.  GI: See history of present illness. GU:  Negative for dysuria, hematuria, urinary incontinence, urinary frequency, nocturnal urination.  Endo: Negative for unusual weight change.     Physical Exam   BP 134/80   Pulse 60   Temp 98.3 F (36.8 C)   Ht 5\' 6"  (1.676 m)   Wt 167 lb 3.2 oz (75.8 kg)   BMI 26.99 kg/m    General: Well-nourished, well-developed in no acute distress.  Eyes: No icterus. Mouth: Oropharyngeal mucosa moist and pink  Lungs: Clear to auscultation bilaterally.  Heart: Regular rate and rhythm, no murmurs rubs or gallops.  Abdomen: Bowel sounds are normal, nontender, nondistended   Extremities: No lower extremity edema. No clubbing or deformities. Neuro: Alert and oriented x 4   Skin: Warm and dry, no jaundice.   Psych: Alert and cooperative, normal mood and affect.  Labs   09/2023: CBC normal. Cre 1.07, eGFR 53. LFTs normal. Imaging Studies   No results found.  Assessment/Plan:   Diarrhea: difficult to sort out in setting of IBS, recent and current antibiotic use. Cannot rule out antibiotic associated diarrhea, post-infectious diarrhea, or Cdiff. She feels some improvement the last couple of days. Currently on Bactrim  and Flagyl  as outlined. She can monitor if she prefers but if persistent symptoms, then complete stool testing. -GI profile if needed -probiotic daily for four weeks -continue Levsin qid prn -return ov as needed    [employer]. Harles Lied, MHS, PA-C [facility]

## 2023-11-03 NOTE — Patient Instructions (Signed)
 If you continue to have 3-4 stools a day, stools are liquid, then you need to complete stool testing to rule out infection.  Complete ultrasound ordered by gynecology.  Complete antibiotics given to your for UTI and bacterial vaginosis by gynecology.  Consider adding probiotic one daily for four weeks. You can use [employer], [employer] or other options you find over the counter.

## 2023-11-10 ENCOUNTER — Ambulatory Visit: Admitting: Radiology

## 2023-11-10 DIAGNOSIS — R1032 Left lower quadrant pain: Secondary | ICD-10-CM

## 2023-11-10 NOTE — Progress Notes (Signed)
 GYN US : TA and TV imaging performed - vinyl probe cover used - Chaperone: Keila Saggital and Transverse views of pelvis obtained.  Hx of Hysterectomy years ago Neg vaginal cuff and surrounding region The ovaries are not seen from TA approach or TV approach neg adnexal regions, neg CDS, no free fluid present

## 2023-11-13 ENCOUNTER — Telehealth: Payer: Self-pay | Admitting: Adult Health

## 2023-11-13 NOTE — Telephone Encounter (Signed)
 Left message that US  showed no masses, could not see ovaries and there was not free fluid, you will be able to see in MyChart after MD reads the US . Take care

## 2023-11-20 ENCOUNTER — Other Ambulatory Visit: Payer: Self-pay

## 2023-11-20 ENCOUNTER — Encounter (HOSPITAL_COMMUNITY): Payer: Self-pay

## 2023-11-20 ENCOUNTER — Emergency Department (HOSPITAL_COMMUNITY)
Admission: EM | Admit: 2023-11-20 | Discharge: 2023-11-21 | Disposition: A | Discharge status: Home / Self Care | Attending: Emergency Medicine | Admitting: Emergency Medicine

## 2023-11-20 ENCOUNTER — Emergency Department (HOSPITAL_COMMUNITY)

## 2023-11-20 DIAGNOSIS — Z79899 Other long term (current) drug therapy: Secondary | ICD-10-CM | POA: Diagnosis not present

## 2023-11-20 DIAGNOSIS — I6523 Occlusion and stenosis of bilateral carotid arteries: Secondary | ICD-10-CM | POA: Diagnosis not present

## 2023-11-20 DIAGNOSIS — I672 Cerebral atherosclerosis: Secondary | ICD-10-CM | POA: Diagnosis not present

## 2023-11-20 DIAGNOSIS — R519 Headache, unspecified: Secondary | ICD-10-CM | POA: Diagnosis not present

## 2023-11-20 DIAGNOSIS — I10 Essential (primary) hypertension: Secondary | ICD-10-CM | POA: Insufficient documentation

## 2023-11-20 DIAGNOSIS — R918 Other nonspecific abnormal finding of lung field: Secondary | ICD-10-CM | POA: Diagnosis not present

## 2023-11-20 DIAGNOSIS — R079 Chest pain, unspecified: Secondary | ICD-10-CM | POA: Diagnosis not present

## 2023-11-20 LAB — COMPREHENSIVE METABOLIC PANEL WITH GFR
ALT: 16 U/L (ref 0–44)
AST: 22 U/L (ref 15–41)
Albumin: 4.1 g/dL (ref 3.5–5.0)
Alkaline Phosphatase: 94 U/L (ref 38–126)
Anion gap: 9 (ref 5–15)
BUN: 12 mg/dL (ref 8–23)
CO2: 27 mmol/L (ref 22–32)
Calcium: 9.2 mg/dL (ref 8.9–10.3)
Chloride: 101 mmol/L (ref 98–111)
Creatinine, Ser: 0.85 mg/dL (ref 0.44–1.00)
GFR, Estimated: >60 mL/min (ref >60)
Glucose, Bld: 93 mg/dL (ref 70–99)
Potassium: 4.1 mmol/L (ref 3.5–5.1)
Sodium: 137 mmol/L (ref 135–145)
Total Bilirubin: 0.7 mg/dL (ref 0.0–1.2)
Total Protein: 8.1 g/dL (ref 6.5–8.1)

## 2023-11-20 LAB — CBC WITH DIFFERENTIAL/PLATELET
Abs Immature Granulocytes: 0.01 10*3/uL (ref 0.00–0.07)
Basophils Absolute: 0 10*3/uL (ref 0.0–0.1)
Basophils Relative: 0 %
Eosinophils Absolute: 0.1 10*3/uL (ref 0.0–0.5)
Eosinophils Relative: 2 %
HCT: 41.1 % (ref 36.0–46.0)
Hemoglobin: 12.8 g/dL (ref 12.0–15.0)
Immature Granulocytes: 0 %
Lymphocytes Relative: 48 %
Lymphs Abs: 2.5 10*3/uL (ref 0.7–4.0)
MCH: 29.2 pg (ref 26.0–34.0)
MCHC: 31.1 g/dL (ref 30.0–36.0)
MCV: 93.6 fL (ref 80.0–100.0)
Monocytes Absolute: 0.4 10*3/uL (ref 0.1–1.0)
Monocytes Relative: 7 %
Neutro Abs: 2.3 10*3/uL (ref 1.7–7.7)
Neutrophils Relative %: 43 %
Platelets: 229 10*3/uL (ref 150–400)
RBC: 4.39 MIL/uL (ref 3.87–5.11)
RDW: 12.7 % (ref 11.5–15.5)
WBC: 5.3 10*3/uL (ref 4.0–10.5)
nRBC: 0 % (ref 0.0–0.2)

## 2023-11-20 LAB — TROPONIN I (HIGH SENSITIVITY)
Troponin I (High Sensitivity): 3 ng/L (ref <18)
Troponin I (High Sensitivity): 3 ng/L (ref <18)

## 2023-11-20 MED ORDER — ACETAMINOPHEN 500 MG PO TABS
1000.0000 mg | ORAL_TABLET | Freq: Once | ORAL | Status: AC
  Administered 2023-11-20: 1000 mg via ORAL
  Filled 2023-11-20: qty 2

## 2023-11-20 NOTE — ED Triage Notes (Signed)
 Pt arrived via POV c/o hypertension, dizziness and intermittent confusion today. Pt reports feeling jittery and nervous.

## 2023-11-20 NOTE — ED Provider Notes (Signed)
 [place] EMERGENCY DEPARTMENT AT [facility] Provider Note   CSN: [id number] Arrival date & time: 11/20/23  1735     History {Add pertinent medical, surgical, social history, OB history to HPI:1} Chief Complaint  Patient presents with   Hypertension    [name] is a 78 y.o. female.   Hypertension       Home Medications Prior to Admission medications   Medication Sig Start Date End Date Taking? Authorizing Provider  albuterol  (VENTOLIN  HFA) 108 (90 Base) MCG/ACT inhaler INHALE 1 TO 2 PUFFS BY MOUTH FOUR TIMES DAILY AS NEEDED AS DIRECTED FOR DIFFICULT BREATHING OR WHEEZING 05/29/20   [provider]  amLODipine -valsartan  (EXFORGE ) 5-320 MG tablet Take 1 tablet by mouth daily. 06/10/23   Knox Perl, MD  Cholecalciferol (VITAMIN D) 50 MCG (2000 UT) tablet Take 2,000 Units by mouth daily.    [provider]  escitalopram (LEXAPRO) 10 MG tablet Take 10 mg by mouth daily.    [provider]  furosemide (LASIX) 20 MG tablet Take 20 mg by mouth daily as needed for fluid. 11/13/10   [provider]  metroNIDAZOLE  (FLAGYL ) 500 MG tablet Take 1 tablet (500 mg total) by mouth 2 (two) times daily. 10/31/23   Javan Messing, NP  Multiple Vitamins-Minerals (MULTIVITAMIN ADULT) CHEW Chew 2 each by mouth daily.    [provider]  primidone  (MYSOLINE ) 50 MG tablet TAKE 3 TABS IN THE AM AND 1 TAB IN THE PM 09/22/23   Tat, Von Grumbling, DO  propranolol  ER (INDERAL  LA) 80 MG 24 hr capsule TAKE 1 CAPSULE BY MOUTH EVERY DAY 06/24/23   Knox Perl, MD  rosuvastatin (CRESTOR) 5 MG tablet Take 5 mg by mouth daily. 12/09/22   [provider]  sulfamethoxazole -trimethoprim  (BACTRIM  DS) 800-160 MG tablet Take 1 tablet by mouth 2 (two) times daily. Take 1 bid 10/31/23   Lendia Quay A, NP  terconazole  (TERAZOL 7 ) 0.4 % vaginal cream Place 1 applicator vaginally at bedtime. 10/27/23   Javan Messing, NP      Allergies    Morphine  and codeine     Review of Systems   Review of Systems  Physical Exam Updated Vital Signs BP (!) 183/88   Pulse (!) 54   Temp (!) 96.6 F (35.9 C)   Resp 15   Ht 5\' 6"  (1.676 m)   Wt 75.8 kg   SpO2 97%   BMI 26.97 kg/m  Physical Exam  ED Results / Procedures / Treatments   Labs (all labs ordered are listed, but only abnormal results are displayed) Labs Reviewed  CBC WITH DIFFERENTIAL/PLATELET  COMPREHENSIVE METABOLIC PANEL WITH GFR  BRAIN NATRIURETIC PEPTIDE  TROPONIN I (HIGH SENSITIVITY)  TROPONIN I (HIGH SENSITIVITY)    EKG EKG Interpretation Date/Time:  Thursday Nov 20 2023 17:54:46 EDT Ventricular Rate:  65 PR Interval:  154 QRS Duration:  92 QT Interval:  412 QTC Calculation: 428 R Axis:   73  Text Interpretation: Normal sinus rhythm Possible Left atrial enlargement Minimal voltage criteria for LVH, may be normal variant ( Sokolow-Lyon ) Similar to prior Confirmed by Annita Kindle [phone]) on 11/20/2023 10:12:33 PM  Radiology DG Chest 2 View Result Date: 11/20/2023 CLINICAL DATA:  Chest pain. EXAM: CHEST - 2 VIEW COMPARISON:  03/30/2022. FINDINGS: The heart size and mediastinal contours are within normal limits. Minimal linear atelectasis/scarring in the left lower lobe. No focal consolidation, pleural effusion, or pneumothorax. Stable dextroscoliotic curvature of the thoracic spine. No  acute osseous abnormality. IMPRESSION: Minimal linear atelectasis/scarring in the left lower lobe. Otherwise, no acute cardiopulmonary findings. Electronically Signed   By: Mannie Seek M.D.   On: 11/20/2023 18:28    Procedures Procedures  {Document cardiac monitor, telemetry assessment procedure when appropriate:1}  Medications Ordered in ED Medications - No data to display  ED Course/ Medical Decision Making/ A&P   {   Click here for ABCD2, HEART and other calculatorsREFRESH Note before signing :1}                              Medical Decision Making Amount and/or  Complexity of Data Reviewed Labs: ordered. Radiology: ordered.   ***  {Document critical care time when appropriate:1} {Document review of labs and clinical decision tools ie heart score, Chads2Vasc2 etc:1}  {Document your independent review of radiology images, and any outside records:1} {Document your discussion with family members, caretakers, and with consultants:1} {Document social determinants of health affecting pt's care:1} {Document your decision making why or why not admission, treatments were needed:1} Final Clinical Impression(s) / ED Diagnoses Final diagnoses:  None    Rx / DC Orders ED Discharge Orders     None

## 2023-11-20 NOTE — ED Notes (Signed)
 ED Provider at bedside.

## 2023-11-21 ENCOUNTER — Emergency Department (HOSPITAL_COMMUNITY)

## 2023-11-21 ENCOUNTER — Other Ambulatory Visit (HOSPITAL_COMMUNITY)

## 2023-11-21 DIAGNOSIS — I10 Essential (primary) hypertension: Secondary | ICD-10-CM | POA: Diagnosis not present

## 2023-11-21 DIAGNOSIS — R519 Headache, unspecified: Secondary | ICD-10-CM | POA: Diagnosis not present

## 2023-11-21 DIAGNOSIS — I6523 Occlusion and stenosis of bilateral carotid arteries: Secondary | ICD-10-CM | POA: Diagnosis not present

## 2023-11-21 DIAGNOSIS — I672 Cerebral atherosclerosis: Secondary | ICD-10-CM | POA: Diagnosis not present

## 2023-11-21 DIAGNOSIS — R0609 Other forms of dyspnea: Secondary | ICD-10-CM | POA: Diagnosis not present

## 2023-11-21 DIAGNOSIS — Z7689 Persons encountering health services in other specified circumstances: Secondary | ICD-10-CM | POA: Diagnosis not present

## 2023-11-21 LAB — BRAIN NATRIURETIC PEPTIDE: B Natriuretic Peptide: 252 pg/mL — ABNORMAL HIGH (ref 0.0–100.0)

## 2023-11-21 MED ORDER — AMLODIPINE BESYLATE-VALSARTAN 5-320 MG PO TABS
1.0000 | ORAL_TABLET | Freq: Two times a day (BID) | ORAL | 1 refills | Status: DC | PRN
Start: 2023-11-21 — End: 2023-12-03

## 2023-11-21 MED ORDER — IOHEXOL 350 MG/ML SOLN
75.0000 mL | Freq: Once | INTRAVENOUS | Status: AC | PRN
  Administered 2023-11-21: 75 mL via INTRAVENOUS

## 2023-11-21 NOTE — ED Notes (Signed)
 Patient transported to CT

## 2023-11-21 NOTE — ED Notes (Signed)
 Pt unhooked from monitoring system to go the the restroom

## 2023-11-25 ENCOUNTER — Other Ambulatory Visit: Payer: Self-pay | Admitting: Cardiology

## 2023-11-25 DIAGNOSIS — I10 Essential (primary) hypertension: Secondary | ICD-10-CM

## 2023-11-27 NOTE — Telephone Encounter (Signed)
 Per last office visit with Dr Berry Bristol patient is to see him as needed and should follow up with PCP for BP management and leg edema   I spoke with [facility] and asked them to send refill request to patient's PCP.  I also let [facility] know patient was seen in ED recently and med changes were made.

## 2023-12-03 ENCOUNTER — Ambulatory Visit: Attending: Emergency Medicine | Admitting: Emergency Medicine

## 2023-12-03 ENCOUNTER — Encounter: Payer: Self-pay | Admitting: Emergency Medicine

## 2023-12-03 VITALS — BP 126/84 | HR 61 | Resp 16 | Ht 66.0 in | Wt 166.0 lb

## 2023-12-03 DIAGNOSIS — R600 Localized edema: Secondary | ICD-10-CM

## 2023-12-03 DIAGNOSIS — R002 Palpitations: Secondary | ICD-10-CM

## 2023-12-03 DIAGNOSIS — I10 Essential (primary) hypertension: Secondary | ICD-10-CM

## 2023-12-03 MED ORDER — AMLODIPINE BESYLATE 5 MG PO TABS
ORAL_TABLET | ORAL | 2 refills | Status: DC
Start: 2023-12-03 — End: 2024-03-04

## 2023-12-03 MED ORDER — AMLODIPINE BESYLATE-VALSARTAN 5-320 MG PO TABS: 1.0000 | ORAL_TABLET | Freq: Every day | ORAL | 2 refills | Status: DC

## 2023-12-03 NOTE — Progress Notes (Signed)
 Cardiology Office Note:    Date:  12/03/2023  ID:  [name], DOB September 25, 1945, MRN [id number] PCP: Omie Bickers, MD  [facility] Providers Cardiologist:  Knox Perl, MD       Patient Profile:       Chief Complaint: ED follow-up for hypertension History of Present Illness:  [name] is a 78 y.o. 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  Her symptoms of palpitations are related to extreme sensitivity to increased heart rate when she stands up or when she walks.  She was started on propranolol  ER.  She underwent Lexiscan  stress test that showed normal myocardial perfusion without regional wall motion abnormalities, EF 71%.  Echocardiogram was completed on 12/10/2022 showing LVEF 66%, mild LVH, grade 1 DD, trace AR, mild TR.  She was last seen in office on 12/12/2022.  Her amlodipine /valsartan  was increased from 5-160 mg to 5-320 mg.  She had noted her palpitations have improved on propranolol .  Most recently she was seen in the ED on 11/20/2023 for hypertension.  She went to the pharmacy to have her blood pressure taken and was told she needed to go to the ER.  Her blood pressure was noted to be 178/92 in the ED.  Troponins were 3, 3.  CT angio head and neck showed no emergent large vessel occlusion or hemodynamically significant stenosis of the head or neck.  She was to follow-up with cardiology service.   Discussed the use of AI scribe software for clinical note transcription with the patient, who gave verbal consent to proceed.  History of Present Illness [name] is a 78 year old female with hypertension who presents for follow-up after an emergency department visit for elevated blood pressure.  Today she arrives to  clinic with her sister.  Patient is without any acute cardiovascular concerns or complaints at this time.  She brings in her blood pressure readings from home, a total of 8 readings.  Her blood pressure readings averaging in the 130s.  With 1 anomaly in the 160s.  She takes her blood pressure medications in the morning.  She was told by the ED physician to take her amlodipine -valsartan  5-320 mg twice daily if her blood pressure was consistently over 160.  Patient did this 1 time.  Her pulse rate is stable at home in the 60s. She experiences occasional headaches. Swelling in her ankles is noted, attributed to her shoes.   She denies any chest pains, dyspnea, orthopnea, PND, palpitations, syncope, presyncope.  Review of systems:  Please see the history of present illness. All other systems are reviewed and otherwise negative.      Studies Reviewed:        Echocardiogram 12/10/2022 Left ventricle cavity is normal in size. Mild concentric hypertrophy of  the left ventricle. Normal global wall motion. Doppler evidence of grade I  (impaired) diastolic dysfunction, normal LAP. Normal LV systolic function  with EF 66%. Calculated EF 66%.  Structurally normal trileaflet aortic valve. Trace aortic regurgitation.  Structurally normal tricuspid valve. Mild tricuspid regurgitation. No  evidence of pulmonary hypertension. RVSP measures 34 mmHg.   Lexiscan  stress test 12/05/2022 Myocardial perfusion is normal. Overall LV systolic function is normal without regional wall motion abnormalities. Stress LV EF: 71%.  Equivocal ECG stress. The patient exercised  for 3 minutes and 0 seconds of a Bruce protocol, achieving approximately 4.64 METs & 101% of MPHR. Peak EKG revealed 2 mm horizontal ST depression of the inferolateral leads. During exercise the peak ECG revealed no arrhythmias. ST changes resolved at 2 min into recovery. No chest pain. Dyspnea present.  The heart rate response was accelerated.  The baseline  blood pressure was 138/82 mmHg and increased to 260/110 mmHg, which is a hypertensive response. No change in EKG compared to 11/29/2022 GXT. EKG changes may suggest hypertensive changes. Low risk.  Risk Assessment/Calculations:              Physical Exam:   VS:  BP 126/84 (BP Location: Right Arm, Patient Position: Sitting, Cuff Size: Normal)   Pulse 61   Resp 16   Ht 5\' 6"  (1.676 m)   Wt 166 lb (75.3 kg)   SpO2 100%   BMI 26.79 kg/m    Wt Readings from Last 3 Encounters:  12/03/23 166 lb (75.3 kg)  11/20/23 167 lb 1.7 oz (75.8 kg)  11/03/23 167 lb 3.2 oz (75.8 kg)    GEN: Well nourished, well developed in no acute distress NECK: No JVD; No carotid bruits CARDIAC: RRR, no murmurs, rubs, gallops RESPIRATORY:  Clear to auscultation without rales, wheezing or rhonchi  ABDOMEN: Soft, non-tender, non-distended EXTREMITIES:  No edema; No acute deformity      Assessment and Plan:  Hypertension Blood pressure today is 126/84 and under adequate control Patient's home blood pressure readings average 130s with max reading 160s (8 total readings) - Will give patient blood pressure log today and have patient record readings for the next month - Will prescribe amlodipine  5 mg as needed for systolic blood pressure > 160 systolic - Continue amlodipine -valsartan  5-320 mg daily and propranolol  80 mg daily  Palpitations She denies any symptoms concerning for recurrent palpitations Symptoms well-controlled on propranolol  - Continue propranolol  80 mg daily  Bilateral leg edema Patient notes her bilateral leg edema is well-controlled.  She is not having to use loop diuretic therapy at this time. - Weight remains stable - Encouraged low-sodium dieting, leg elevation, and physical exercise      Dispo:  Return in about 8 weeks (around 01/28/2024).  Signed, Ava Boatman, NP

## 2023-12-03 NOTE — Patient Instructions (Signed)
 Medication Instructions:  TAKE AMLODIPINE -VALSARTAN  5/320 MG ONCE DAILY. TAKE AMLODIPINE  5 MG AS NEEDED IF YOUR SYSTOLIC NUMBER (TOP NUMBER) IS GREATER THAN 160. CHECK AND LOG BP EACH DAY.  Lab Work: NONE  Testing/Procedures: NONE  Follow-Up: At [facility], you and your health needs are our priority.  As part of our continuing mission to provide you with exceptional heart care, our providers are all part of one team.  This team includes your primary Cardiologist (physician) and Advanced Practice Providers or APPs (Physician Assistants and Nurse Practitioners) who all work together to provide you with the care you need, when you need it.  Your next appointment:   6-8 WEEKS  Provider:   MADISON FOUNTAIN, DNP  We recommend signing up for the patient portal called "MyChart".  Sign up information is provided on this After Visit Summary.  MyChart is used to connect with patients for Virtual Visits (Telemedicine).  Patients are able to view lab/test results, encounter notes, upcoming appointments, etc.  Non-urgent messages can be sent to your provider as well.   To learn more about what you can do with MyChart, go to [email or web address].

## 2023-12-05 ENCOUNTER — Telehealth: Payer: Self-pay | Admitting: Cardiology

## 2023-12-05 NOTE — Telephone Encounter (Signed)
 Agree, no other specific recommendations. If headache then needs to go to urgent care or ED to get evaluated

## 2023-12-05 NOTE — Telephone Encounter (Signed)
 Called [name] back about message. [name] stated she had a headache and an elevated BP 192/115 HR 70 earlier this afternoon. [name] stated she took her PRN amlodipine  5 mg for SBP over 160. [name] stated her headache went away. [name] is out, but she will check her BP when she returns home. Encouraged [name] to keep a record of her BPs and see how often she is needing to take her BP. [name] stated she got some stressful news earlier this morning. Encouraged [name] to try to use deep breathing to help with stressful situations. Will forward to Dr. Berry Bristol for advisement.

## 2023-12-05 NOTE — Telephone Encounter (Signed)
 Called patient back with Dr. Maida Sciara recommendations. Patient verbalized understanding.

## 2023-12-05 NOTE — Telephone Encounter (Signed)
 Pt c/o BP issue: STAT if pt c/o blurred vision, one-sided weakness or slurred speech.  STAT if BP is GREATER than 180/120 TODAY.  STAT if BP is LESS than 90/60 and SYMPTOMATIC TODAY  1. What is your BP concern?   Patient is following-up as requested  2. Have you taken any BP medication today?  Yes - morning dose  3. What are your last 5 BP readings?  192/115  HR 70  1:10 pm  4. Are you having any other symptoms (ex. Dizziness, headache, blurred vision, passed out)?  No   Patient stated she also took a dose of amLODipine  (NORVASC ) 5 MG tablet around 1:15 pm.  Patient is reporting her BP has come down and her headache went away.

## 2023-12-18 ENCOUNTER — Telehealth: Payer: Self-pay | Admitting: Cardiology

## 2023-12-18 NOTE — Telephone Encounter (Signed)
 Pt c/o BP issue: STAT if pt c/o blurred vision, one-sided weakness or slurred speech.  STAT if BP is GREATER than 180/120 TODAY.  STAT if BP is LESS than 90/60 and SYMPTOMATIC TODAY  1. What is your BP concern? Pt states her bp has been high   2. Have you taken any BP medication today? took her morning dose today when her head started hurting before taking her bp. She states she did take amlodipine  around 2am as needed. She asked if its okay to take it again during the day. Please advise.   3. What are your last 5 BP readings?  166/103 hr 58 - 2am this morning  174/93 hr 72 - around 945am    4. Are you having any other symptoms (ex. Dizziness, headache, blurred vision, passed out)? Headache (extremely bad she states)

## 2023-12-18 NOTE — Telephone Encounter (Signed)
 Spoke with pt, she has an elevated blood pressure and headache. She reports the following blood pressures, 166/103, 174/111, 172/104,179/94. She did take an extra amlodipine  5 mg last night before bed. She reports the headache is not as bad now as it was last night. She has not eaten any high sodium foods. She wants to know how often to take the amlodipine  if her blood pressure continues to run high. Aware will forward to the NP for review.

## 2023-12-22 NOTE — Telephone Encounter (Signed)
 Spoke with patient and she is aware to start taking amlodipine  daily. She will continue to monitor BP. She will call next week with readings. She verbalized understanding

## 2024-01-11 ENCOUNTER — Other Ambulatory Visit: Payer: Self-pay | Admitting: Neurology

## 2024-01-11 DIAGNOSIS — G250 Essential tremor: Secondary | ICD-10-CM

## 2024-01-13 ENCOUNTER — Other Ambulatory Visit: Payer: Self-pay

## 2024-01-13 DIAGNOSIS — R002 Palpitations: Secondary | ICD-10-CM

## 2024-01-13 DIAGNOSIS — I10 Essential (primary) hypertension: Secondary | ICD-10-CM

## 2024-01-13 DIAGNOSIS — G252 Other specified forms of tremor: Secondary | ICD-10-CM

## 2024-01-13 MED ORDER — PROPRANOLOL HCL ER 80 MG PO CP24: 80.0000 mg | ORAL_CAPSULE | Freq: Every day | ORAL | 3 refills | Status: AC

## 2024-01-20 DIAGNOSIS — F439 Reaction to severe stress, unspecified: Secondary | ICD-10-CM | POA: Diagnosis not present

## 2024-01-20 DIAGNOSIS — R519 Headache, unspecified: Secondary | ICD-10-CM | POA: Diagnosis not present

## 2024-01-20 DIAGNOSIS — I10 Essential (primary) hypertension: Secondary | ICD-10-CM | POA: Diagnosis not present

## 2024-01-20 DIAGNOSIS — Z87891 Personal history of nicotine dependence: Secondary | ICD-10-CM | POA: Diagnosis not present

## 2024-01-20 DIAGNOSIS — R109 Unspecified abdominal pain: Secondary | ICD-10-CM | POA: Diagnosis not present

## 2024-01-22 ENCOUNTER — Telehealth: Payer: Self-pay | Admitting: Cardiology

## 2024-01-22 NOTE — Telephone Encounter (Signed)
 Pt c/o BP issue: STAT if pt c/o blurred vision, one-sided weakness or slurred speech.  STAT if BP is GREATER than 180/120 TODAY.  STAT if BP is LESS than 90/60 and SYMPTOMATIC TODAY  1. What is your BP concern?   BP trending high  2. Have you taken any BP medication today?  Not today  3. What are your last 5 BP readings?  Today 12:01 am  - 189/105 12:05 am - 171/96 148/102 3:30 am - 148/80  4. Are you having any other symptoms (ex. Dizziness, headache, blurred vision, passed out)?   Headache   Patient is concerned her BP has been trending high and she got up late today and has not taken her BP medication as yet.  Patient noted last night she had a headache on the right side of her head which went to the left side.

## 2024-01-22 NOTE — Telephone Encounter (Signed)
 135/86 131/78 130/77 134/78 125/84 138/78 138/79 137/82 123/72 133/75  All morning pressures this month.  Last night was an exception and she was having a significant pain in her side.  She thinks it may be a hernia or scar tissue.  She took tyl for headache.  Will continue to monitor BPs.  Takes the amlodipine  5 mg daily at evening time, separate from EXFORGE .  Has follow up with APP 01/27/24.

## 2024-01-26 ENCOUNTER — Encounter: Payer: Self-pay | Admitting: *Deleted

## 2024-01-27 ENCOUNTER — Encounter: Payer: Self-pay | Admitting: Emergency Medicine

## 2024-01-27 ENCOUNTER — Telehealth: Payer: Self-pay | Admitting: *Deleted

## 2024-01-27 ENCOUNTER — Ambulatory Visit: Attending: Cardiology | Admitting: Emergency Medicine

## 2024-01-27 VITALS — BP 126/58 | HR 67 | Ht 66.0 in | Wt 163.0 lb

## 2024-01-27 DIAGNOSIS — R0609 Other forms of dyspnea: Secondary | ICD-10-CM

## 2024-01-27 DIAGNOSIS — R002 Palpitations: Secondary | ICD-10-CM

## 2024-01-27 DIAGNOSIS — R600 Localized edema: Secondary | ICD-10-CM

## 2024-01-27 DIAGNOSIS — I10 Essential (primary) hypertension: Secondary | ICD-10-CM

## 2024-01-27 DIAGNOSIS — I779 Disorder of arteries and arterioles, unspecified: Secondary | ICD-10-CM | POA: Diagnosis not present

## 2024-01-27 NOTE — Progress Notes (Signed)
 Cardiology Office Note:    Date:  01/27/2024  ID:  [name], DOB 1946-05-20, MRN [id number] PCP: Alice Norleen PEDLAR, MD  [facility] Providers Cardiologist:  Alice Bergamo, MD       Patient Profile:       Chief Complaint: 23-month follow-up History of Present Illness:  [name] is a 78 y.o. 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  Her symptoms of palpitations are related to extreme sensitivity to increased heart rate when she stands up or when she walks.  She was started on propranolol  ER.  She underwent Lexiscan  stress test that showed normal myocardial perfusion without regional wall motion abnormalities, EF 71%.  Echocardiogram was completed on 12/10/2022 showing LVEF 66%, mild LVH, grade 1 DD, trace AR, mild TR.   She was seen in office on 12/12/2022.  Her amlodipine /valsartan  was increased from 5-160 mg to 5-320 mg.  She had noted her palpitations have improved on propranolol .   Most recently she was seen in the ED on 11/20/2023 for hypertension.  She went to the pharmacy to have her blood pressure taken and was told she needed to go to the ER.  Her blood pressure was noted to be 178/92 in the ED.  Troponins were 3, 3.  CT angio head and neck showed no emergent large vessel occlusion or hemodynamically significant stenosis of the head or neck.  She was to follow-up with cardiology service.  She was last seen in the office on 12/03/2023.  Her blood pressure was under adequate control at 126/84.  However home blood pressures average 130s with a max reading of 160s.  Amlodipine  5 mg as needed was prescribed for systolic blood pressure greater than 160.  She was continued on amlodipine -valsartan  5- 320 mg daily and propranolol  80 mg  daily.   She called the nurse triage line on 12/18/2023 with elevated blood pressures up to 179/94.  She was began taking the as needed amlodipine  5 mg daily at nighttime.   Discussed the use of AI scribe software for clinical note transcription with the patient, who gave verbal consent to proceed.  History of Present Illness [name] is a 78 year old female with hypertension who presents for blood pressure management.  She takes her blood pressure at home daily and has brought in her blood pressure logs.  Her blood pressure readings are consistently in the 120s-130s/70s range, improved from previous higher readings. She takes Exforge  (amlodipine  and valsartan ) once daily and an additional amlodipine  pill daily.  She usually takes her second dose amlodipine  at nighttime.  She reports her lower extremity swelling is well-controlled and the extra dose of amlodipine  is not affecting her swelling.  She does report her elevated blood pressure readings in the past could be due to added stress.  She does note she is very active in her church and that her church causes her quite a bit of stress.  She continues to have headaches which are dull, mostly occurring at night, and associated with stress. These occur even when blood pressure is controlled.  No chest pain or shortness of breath.  Denies any syncope, presyncope, palpitations, lower extremity swelling, orthopnea, PND, lightheadedness, dizziness.  Review of systems:  Please see the history of present illness. All other systems are reviewed  and otherwise negative.      Studies Reviewed:        Echocardiogram 12/10/2022 Left ventricle cavity is normal in size. Mild concentric hypertrophy of  the left ventricle. Normal global wall motion. Doppler evidence of grade I  (impaired) diastolic dysfunction, normal LAP. Normal LV systolic function  with EF 66%. Calculated EF 66%.  Structurally normal trileaflet aortic valve. Trace aortic  regurgitation.  Structurally normal tricuspid valve. Mild tricuspid regurgitation. No  evidence of pulmonary hypertension. RVSP measures 34 mmHg.    Lexiscan  stress test 12/05/2022 Myocardial perfusion is normal. Overall LV systolic function is normal without regional wall motion abnormalities. Stress LV EF: 71%.  Equivocal ECG stress. The patient exercised for 3 minutes and 0 seconds of a Bruce protocol, achieving approximately 4.64 METs & 101% of MPHR. Peak EKG revealed 2 mm horizontal ST depression of the inferolateral leads. During exercise the peak ECG revealed no arrhythmias. ST changes resolved at 2 min into recovery. No chest pain. Dyspnea present.  The heart rate response was accelerated.  The baseline blood pressure was 138/82 mmHg and increased to 260/110 mmHg, which is a hypertensive response. No change in EKG compared to 11/29/2022 GXT. EKG changes may suggest hypertensive changes. Low risk.   Risk Assessment/Calculations:              Physical Exam:   VS:  BP (!) 126/58 (BP Location: Left Arm, Patient Position: Sitting, Cuff Size: Normal)   Pulse 67   Ht 5' 6 (1.676 m)   Wt 163 lb (73.9 kg)   BMI 26.31 kg/m    Wt Readings from Last 3 Encounters:  01/27/24 163 lb (73.9 kg)  12/03/23 166 lb (75.3 kg)  11/20/23 167 lb 1.7 oz (75.8 kg)    GEN: Well nourished, well developed in no acute distress NECK: No JVD; No carotid bruits CARDIAC: RRR, no murmurs, rubs, gallops RESPIRATORY:  Clear to auscultation without rales, wheezing or rhonchi  ABDOMEN: Soft, non-tender, non-distended EXTREMITIES:  No edema; No acute deformity      Assessment and Plan:  Hypertension Blood pressure today is 126/58 and well-controlled Home blood pressure readings average 120s-130s over 70s Blood pressure under much better control since increasing amlodipine  - Continue amlodipine  5 mg daily in the p.m. - Continue amlodipine -valsartan  5-320 mg daily and propranolol  80 mg daily in the a.m.    Palpitations Quiescent and well-controlled - She denies any symptoms concerning for recurrent palpitations - Continue propranolol  80 mg daily   Bilateral leg edema Well-controlled, especially on amlodipine .  She is not having to use her as needed loop diuretic therapy at this time. - Weight remains stable - Encouraged low-sodium dieting, compression socks, leg elevation, and physical exercise  Dyspnea on exertion Echocardiogram 12/2022 with LVEF of 66%, mild LVH, grade 1 DD Well-controlled.  She denies any DOE at this time  Carotid artery disease CT angio head/neck 11/21/2023 showed no LVO with mild bilateral carotid bifurcation atherosclerosis without hemodynamically significant stenosis - Today she remains asymptomatic - Continue rosuvastatin 5 mg daily      Dispo:  Return in about 6 months (around 07/29/2024).  Signed, Lum LITTIE Louis, NP

## 2024-01-27 NOTE — Telephone Encounter (Signed)
Visit

## 2024-01-27 NOTE — Patient Instructions (Addendum)
 Medication Instructions:  NO CHANGES  Lab Work: NONE   Testing/Procedures: NONE  Follow-Up: At [facility], you and your health needs are our priority.  As part of our continuing mission to provide you with exceptional heart care, our providers are all part of one team.  This team includes your primary Cardiologist (physician) and Advanced Practice Providers or APPs (Physician Assistants and Nurse Practitioners) who all work together to provide you with the care you need, when you need it.  Your next appointment:   6 MONTHS  Provider:   Gordy Bergamo, MD

## 2024-01-28 DIAGNOSIS — I10 Essential (primary) hypertension: Secondary | ICD-10-CM | POA: Diagnosis not present

## 2024-01-28 DIAGNOSIS — R7301 Impaired fasting glucose: Secondary | ICD-10-CM | POA: Diagnosis not present

## 2024-01-29 ENCOUNTER — Telehealth: Payer: Self-pay

## 2024-01-29 DIAGNOSIS — M1712 Unilateral primary osteoarthritis, left knee: Secondary | ICD-10-CM | POA: Diagnosis not present

## 2024-01-29 DIAGNOSIS — M25562 Pain in left knee: Secondary | ICD-10-CM | POA: Diagnosis not present

## 2024-01-29 NOTE — Telephone Encounter (Signed)
 Up to date on meds, adjustments to her amlodipine /valsartan  make it appear she's overdue however won't need this until August. Coordinating with PCP to get her on a fixed dose going forwards.

## 2024-02-03 ENCOUNTER — Other Ambulatory Visit (HOSPITAL_COMMUNITY): Payer: Self-pay | Admitting: Family Medicine

## 2024-02-03 DIAGNOSIS — M546 Pain in thoracic spine: Secondary | ICD-10-CM

## 2024-02-04 DIAGNOSIS — M9901 Segmental and somatic dysfunction of cervical region: Secondary | ICD-10-CM | POA: Diagnosis not present

## 2024-02-04 DIAGNOSIS — M546 Pain in thoracic spine: Secondary | ICD-10-CM | POA: Diagnosis not present

## 2024-02-04 DIAGNOSIS — M62830 Muscle spasm of back: Secondary | ICD-10-CM | POA: Diagnosis not present

## 2024-02-04 DIAGNOSIS — M9903 Segmental and somatic dysfunction of lumbar region: Secondary | ICD-10-CM | POA: Diagnosis not present

## 2024-02-04 DIAGNOSIS — M9902 Segmental and somatic dysfunction of thoracic region: Secondary | ICD-10-CM | POA: Diagnosis not present

## 2024-02-04 DIAGNOSIS — M542 Cervicalgia: Secondary | ICD-10-CM | POA: Diagnosis not present

## 2024-02-07 ENCOUNTER — Ambulatory Visit (HOSPITAL_COMMUNITY)
Admission: RE | Admit: 2024-02-07 | Discharge: 2024-02-07 | Disposition: A | Discharge status: Home / Self Care | Source: Ambulatory Visit | Attending: Family Medicine | Admitting: Family Medicine

## 2024-02-07 DIAGNOSIS — M5134 Other intervertebral disc degeneration, thoracic region: Secondary | ICD-10-CM | POA: Diagnosis not present

## 2024-02-07 DIAGNOSIS — M47814 Spondylosis without myelopathy or radiculopathy, thoracic region: Secondary | ICD-10-CM | POA: Diagnosis not present

## 2024-02-07 DIAGNOSIS — M546 Pain in thoracic spine: Secondary | ICD-10-CM | POA: Insufficient documentation

## 2024-02-07 DIAGNOSIS — M419 Scoliosis, unspecified: Secondary | ICD-10-CM | POA: Diagnosis not present

## 2024-02-17 DIAGNOSIS — M546 Pain in thoracic spine: Secondary | ICD-10-CM | POA: Diagnosis not present

## 2024-02-25 DIAGNOSIS — U071 COVID-19: Secondary | ICD-10-CM | POA: Diagnosis not present

## 2024-03-02 NOTE — Progress Notes (Unsigned)
 Virtual Visit Via Video       Consent was obtained for video visit:  Yes.   Answered questions that patient had about telehealth interaction:  Yes.   I discussed the limitations, risks, security and privacy concerns of performing an evaluation and management service by telemedicine. I also discussed with the patient that there may be a patient responsible charge related to this service. The patient expressed understanding and agreed to proceed.  Pt location: Home Physician Location: office Name of referring provider:  Tisovec, Charlie ORN, MD I connected with [name] at patients initiation/request on 03/04/2024 at 11:15 AM EDT by video enabled telemedicine application and verified that I am speaking with the correct person using two identifiers. Pt MRN:  [id number] Pt DOB:  1946/03/21 Video Participants:  [name];   Assessment/Plan:   1.   Essential Tremor             -continue  primidone , 50 mg, 3 tablets in the morning, and I previously told her to move the last 1 to mid afternoon.  She goes to bed very late (around midnight) and was taking the last 1 around that time and still is.  I did not change that today because she is doing fairly well today, but also complains that she has trouble with tremor when she is under stress, but I suspect that that is not going to change with moving the medication much.             -She is also taking Inderal  LA, 80 mg daily by cardiology.             -budesonide is adding to tremor and palpitations.  I told her that this is really common with the lung medications, but she is not taking the budesonide as directed because of these things.  I told her to follow-up with prescribing physician for COPD.   2.  GAD             -Admits that lack of sleep and stress is driving tremor as well.  3.  COVID  - She is currently recovering and doing much better. Subjective:   [name] was seen today in follow up for essential tremor.  My  previous records were reviewed prior to todays visit.  Patient originally scheduled for in person visit, but changed to video visit this morning because was diagnosed with COVID about a week ago and still has a cough and she did not feel comfortable coming to the office.  Last visit, I told patient to improve her bedtime dose of primidone  that she was previously taking at midnight to sometime in the afternoon to see if we can get better tremor control.  If she did not, she was supposed to call me back.  I did not hear from her about that.  She reports today that tremor has been good as she has been mostly at home and not under much stress.   Current prescribed movement disorder medications: Primidone , 50 mg, 3 tablets in the morning, 1 in the afternoon     ALLERGIES:   Allergies  Allergen Reactions   Morphine And Codeine  Hypertension    CURRENT MEDICATIONS:  Outpatient Encounter Medications as of 03/04/2024  Medication Sig   albuterol  (VENTOLIN  HFA) 108 (90 Base) MCG/ACT inhaler INHALE 1 TO 2 PUFFS BY MOUTH FOUR TIMES DAILY AS NEEDED AS DIRECTED FOR DIFFICULT BREATHING OR WHEEZING   amLODipine  (NORVASC ) 5 MG tablet TAKE  5 MG AS NEEDED IF YOUR SYSTOLIC NUMBER (TOP NUMBER) IS GREATER THAN 160.   amLODipine -valsartan  (EXFORGE ) 5-320 MG tablet Take 1 tablet by mouth daily.   Cholecalciferol (VITAMIN D) 50 MCG (2000 UT) tablet Take 2,000 Units by mouth daily.   escitalopram (LEXAPRO) 10 MG tablet Take 10 mg by mouth daily.   furosemide (LASIX) 20 MG tablet Take 20 mg by mouth daily as needed for fluid.   metroNIDAZOLE  (FLAGYL ) 500 MG tablet Take 1 tablet (500 mg total) by mouth 2 (two) times daily.   Multiple Vitamins-Minerals (MULTIVITAMIN ADULT) CHEW Chew 2 each by mouth daily.   primidone  (MYSOLINE ) 50 MG tablet TAKE 3 TABS IN THE AM AND 1 TAB IN THE PM   propranolol  ER (INDERAL  LA) 80 MG 24 hr capsule Take 1 capsule (80 mg total) by mouth daily.   rosuvastatin (CRESTOR) 5 MG tablet Take 5 mg  by mouth daily.   sulfamethoxazole -trimethoprim  (BACTRIM  DS) 800-160 MG tablet Take 1 tablet by mouth 2 (two) times daily. Take 1 bid   terconazole  (TERAZOL 7 ) 0.4 % vaginal cream Place 1 applicator vaginally at bedtime.   No facility-administered encounter medications on file as of 03/04/2024.     Objective:    PHYSICAL EXAMINATION:    VITALS:   There were no vitals filed for this visit.    GEN:  The patient appears stated age and is in NAD. HEENT:  Normocephalic, atraumatic.  The mucous membranes are moist. The superficial temporal arteries are without ropiness or tenderness.    Neurological examination:  Orientation: The patient is alert and oriented x3. Cranial nerves: There is good facial symmetry. The speech is fluent and clear. there is no tongue deviation. Hearing is intact to conversational tone. Motor: Strength is at least antigravity x4.  Movement examination: Abnormal movements: No rest tremor.  Mild postural and intention tremor on the right.  This is about the same as prior visits. Coordination:  There is no decremation with RAM's  I have reviewed and interpreted the following labs independently   Chemistry      Component Value Date/Time   NA 137 11/20/2023 1823   K 4.1 11/20/2023 1823   CL 101 11/20/2023 1823   CO2 27 11/20/2023 1823   BUN 12 11/20/2023 1823   CREATININE 0.85 11/20/2023 1823      Component Value Date/Time   CALCIUM  9.2 11/20/2023 1823   ALKPHOS 94 11/20/2023 1823   AST 22 11/20/2023 1823   ALT 16 11/20/2023 1823   BILITOT 0.7 11/20/2023 1823      Lab Results  Component Value Date   WBC 5.3 11/20/2023   HGB 12.8 11/20/2023   HCT 41.1 11/20/2023   MCV 93.6 11/20/2023   PLT 229 11/20/2023   No results found for: TSH   Chemistry      Component Value Date/Time   NA 137 11/20/2023 1823   K 4.1 11/20/2023 1823   CL 101 11/20/2023 1823   CO2 27 11/20/2023 1823   BUN 12 11/20/2023 1823   CREATININE 0.85 11/20/2023 1823       Component Value Date/Time   CALCIUM  9.2 11/20/2023 1823   ALKPHOS 94 11/20/2023 1823   AST 22 11/20/2023 1823   ALT 16 11/20/2023 1823   BILITOT 0.7 11/20/2023 1823     Follow up Instructions      -I discussed the assessment and treatment plan with the patient. The patient was provided an opportunity to ask questions and all were answered. The  patient agreed with the plan and demonstrated an understanding of the instructions.   The patient was advised to call back or seek an in-person evaluation if the symptoms worsen or if the condition fails to improve as anticipated.     Asberry Schneider, DO   Cc:  Shona Norleen PEDLAR, MD

## 2024-03-04 ENCOUNTER — Telehealth (INDEPENDENT_AMBULATORY_CARE_PROVIDER_SITE_OTHER): Payer: No Typology Code available for payment source | Admitting: Neurology

## 2024-03-04 DIAGNOSIS — G250 Essential tremor: Secondary | ICD-10-CM | POA: Diagnosis not present

## 2024-03-04 DIAGNOSIS — U071 COVID-19: Secondary | ICD-10-CM

## 2024-03-04 MED ORDER — PRIMIDONE 50 MG PO TABS
ORAL_TABLET | ORAL | 2 refills | Status: AC
Start: 2024-03-04 — End: ?

## 2024-03-23 DIAGNOSIS — M65331 Trigger finger, right middle finger: Secondary | ICD-10-CM | POA: Diagnosis not present

## 2024-03-31 DIAGNOSIS — M542 Cervicalgia: Secondary | ICD-10-CM | POA: Diagnosis not present

## 2024-03-31 DIAGNOSIS — M546 Pain in thoracic spine: Secondary | ICD-10-CM | POA: Diagnosis not present

## 2024-03-31 DIAGNOSIS — M9902 Segmental and somatic dysfunction of thoracic region: Secondary | ICD-10-CM | POA: Diagnosis not present

## 2024-03-31 DIAGNOSIS — M9903 Segmental and somatic dysfunction of lumbar region: Secondary | ICD-10-CM | POA: Diagnosis not present

## 2024-03-31 DIAGNOSIS — M9901 Segmental and somatic dysfunction of cervical region: Secondary | ICD-10-CM | POA: Diagnosis not present

## 2024-03-31 DIAGNOSIS — M62830 Muscle spasm of back: Secondary | ICD-10-CM | POA: Diagnosis not present

## 2024-04-08 ENCOUNTER — Other Ambulatory Visit (HOSPITAL_COMMUNITY): Payer: Self-pay | Admitting: Internal Medicine

## 2024-04-08 DIAGNOSIS — Z1231 Encounter for screening mammogram for malignant neoplasm of breast: Secondary | ICD-10-CM

## 2024-04-09 DIAGNOSIS — M9901 Segmental and somatic dysfunction of cervical region: Secondary | ICD-10-CM | POA: Diagnosis not present

## 2024-04-09 DIAGNOSIS — M546 Pain in thoracic spine: Secondary | ICD-10-CM | POA: Diagnosis not present

## 2024-04-09 DIAGNOSIS — M9903 Segmental and somatic dysfunction of lumbar region: Secondary | ICD-10-CM | POA: Diagnosis not present

## 2024-04-09 DIAGNOSIS — M542 Cervicalgia: Secondary | ICD-10-CM | POA: Diagnosis not present

## 2024-04-09 DIAGNOSIS — M62830 Muscle spasm of back: Secondary | ICD-10-CM | POA: Diagnosis not present

## 2024-04-09 DIAGNOSIS — M9902 Segmental and somatic dysfunction of thoracic region: Secondary | ICD-10-CM | POA: Diagnosis not present

## 2024-04-21 ENCOUNTER — Ambulatory Visit (HOSPITAL_COMMUNITY)
Admission: RE | Admit: 2024-04-21 | Discharge: 2024-04-21 | Disposition: A | Discharge status: Home / Self Care | Source: Ambulatory Visit | Attending: Internal Medicine | Admitting: Internal Medicine

## 2024-04-21 ENCOUNTER — Ambulatory Visit (HOSPITAL_COMMUNITY)
Admission: RE | Admit: 2024-04-21 | Discharge: 2024-04-21 | Disposition: A | Discharge status: Home / Self Care | Source: Ambulatory Visit | Attending: Nurse Practitioner | Admitting: Nurse Practitioner

## 2024-04-21 DIAGNOSIS — Z780 Asymptomatic menopausal state: Secondary | ICD-10-CM | POA: Diagnosis not present

## 2024-04-21 DIAGNOSIS — Z13820 Encounter for screening for osteoporosis: Secondary | ICD-10-CM

## 2024-04-21 DIAGNOSIS — Z1231 Encounter for screening mammogram for malignant neoplasm of breast: Secondary | ICD-10-CM | POA: Insufficient documentation

## 2024-04-21 DIAGNOSIS — M810 Age-related osteoporosis without current pathological fracture: Secondary | ICD-10-CM | POA: Diagnosis not present

## 2024-04-22 DIAGNOSIS — M65331 Trigger finger, right middle finger: Secondary | ICD-10-CM | POA: Diagnosis not present

## 2024-04-28 ENCOUNTER — Ambulatory Visit (INDEPENDENT_AMBULATORY_CARE_PROVIDER_SITE_OTHER): Admitting: Podiatry

## 2024-04-28 ENCOUNTER — Encounter: Payer: Self-pay | Admitting: Podiatry

## 2024-04-28 VITALS — Ht 66.0 in | Wt 163.0 lb

## 2024-04-28 DIAGNOSIS — M5416 Radiculopathy, lumbar region: Secondary | ICD-10-CM

## 2024-04-28 DIAGNOSIS — G629 Polyneuropathy, unspecified: Secondary | ICD-10-CM | POA: Diagnosis not present

## 2024-04-28 NOTE — Progress Notes (Signed)
 Chief Complaint  Patient presents with   Peripheral Neuropathy    Pt is here due to neuropathy, states she has burning sensation and numbness to the feet, was told by her PCP there is no cure for and it can be maintain by medication, states she uses a gel that helps with the pain.    HPI: 78 y.o. female presenting today as a new patient for evaluation of lower extremity peripheral neuropathy.  She has numbness and burning sensation to the feet bilateral.  She is not diabetic.  She does have a long history of lumbar pathology and spinal surgery secondary to scoliosis according to the patient  Past Medical History:  Diagnosis Date   Anxiety    Arthritis    Bronchiolitis    acute   Chronic back pain    Depression    Diverticulosis    GERD (gastroesophageal reflux disease)    negative H pylori stool Ag   High cholesterol    HTN (hypertension)    Hyperplastic colon polyp 11/08/08   IBS (irritable bowel syndrome)    Tubular adenoma of colon    Umbilical hernia    Wears glasses     Past Surgical History:  Procedure Laterality Date   ABDOMINAL HYSTERECTOMY  1985   APPENDECTOMY     BIOPSY  05/09/2016   Procedure: BIOPSY;  Surgeon: Lamar CHRISTELLA Hollingshead, MD;  Location: [facility];  Service: Endoscopy;;  bx ulcer at ileocecal valve   BREAST SURGERY  1989   both sides   CARPAL TUNNEL RELEASE Right 08/23/2013   Procedure: RIGHT ENDOSCOPIC CARPAL  TUNNEL RELEASE;  Surgeon: Alm DELENA Hummer, MD;  Location: [facility];  Service: Orthopedics;  Laterality: Right;   CATARACT EXTRACTION, BILATERAL     CERVICAL DISC SURGERY  2007   CHOLECYSTECTOMY  1980   COLONOSCOPY  11/08/08   Dr. Hollingshead- hemorrhoids, pancolonic diverticula,,hyperplastic polyp   COLONOSCOPY   08/05/2005   MFM:Wnmfjo rectum/Diminutive polyp at 30 cm/Pancolonic diverticula/benign-appearing ulcer in the mid descending colon   COLONOSCOPY N/A 03/30/2013   MFM:Qmpjaoz anal canal hemorrhoids. Single colonic polyp-removed  (tubular adenoma). Colonic diverticulosis.   COLONOSCOPY N/A 05/09/2016   Dr. Hollingshead: diverticulosis in entire colon, mucosal ulceration at IC valve felt to be related to NSAID effect, otherwise normal. due for surveillance in 2022.    COLONOSCOPY WITH PROPOFOL  N/A 11/02/2020   Procedure: COLONOSCOPY WITH PROPOFOL ;  Surgeon: Hollingshead Lamar CHRISTELLA, MD;  Location: [facility];  Service: Endoscopy;  Laterality: N/A;  AM   DORSAL COMPARTMENT RELEASE Left 12/12/2014   Procedure: LEFT WRIST RELEASE DORSAL COMPARTMENT (DEQUERVAIN);  Surgeon: Alm Hummer, MD;  Location: [facility];  Service: Orthopedics;  Laterality: Left;   ECTOPIC PREGNANCY SURGERY     bilat   FOOT SURGERY Right 02/2017   NEPHRECTOMY  1975   left-infection   POLYPECTOMY  11/02/2020   Procedure: POLYPECTOMY INTESTINAL;  Surgeon: Hollingshead Lamar CHRISTELLA, MD;  Location: [facility];  Service: Endoscopy;;   TRIGGER FINGER RELEASE Right 08/23/2013   Procedure: RIGHT INDEX FINGER TRIGGER RELEASE ;  Surgeon: Alm DELENA Hummer, MD;  Location: [facility];  Service: Orthopedics;  Laterality: Right;    Allergies  Allergen Reactions   Morphine And Codeine  Hypertension     Physical Exam: General: The patient is alert and oriented x3 in no acute distress.  Dermatology: Skin is warm, dry and supple bilateral lower extremities.   Vascular: Palpable pedal pulses bilaterally. Capillary refill  within normal limits.  No appreciable edema.  No erythema.  Neurological: Paresthesia with loss of sensation noted to light touch to the bilateral forefoot.  Even distribution  Musculoskeletal Exam: No pedal deformities noted.  Muscle strength 5/5 all compartments  Assessment/Plan of Care: 1.  Chronic history of lumbar pathology 2.  Peripheral polyneuropathy bilateral lower extremities  -Patient evaluated -The numbness in the feet I would suspect are coming from a chronic history of lumbar back pain and pathology.  She does have a  history of scoliosis with surgery to the back.  She has not had her back evaluated for several years but complains of chronic back pain -Referral placed to Dr. Marolyn Breeding; neurosurgery, for possible consideration of SCS -Return to clinic with me PRN      Thresa EMERSON Sar, DPM [facility]  Dr. Thresa EMERSON Sar, DPM    [address] [place], [place] [address]                Office [phone]  Fax [phone]

## 2024-05-03 ENCOUNTER — Telehealth: Payer: Self-pay | Admitting: Lab

## 2024-05-03 NOTE — Telephone Encounter (Signed)
 Patient states that the provider you referred her to is out of town and is unable to drive there she needs a provider in [place] please advise.

## 2024-06-16 ENCOUNTER — Encounter: Payer: Self-pay | Admitting: Adult Health

## 2024-06-16 ENCOUNTER — Ambulatory Visit: Admitting: Adult Health

## 2024-06-16 VITALS — BP 126/77 | HR 70 | Ht 66.0 in | Wt 168.0 lb

## 2024-06-16 DIAGNOSIS — R350 Frequency of micturition: Secondary | ICD-10-CM

## 2024-06-16 DIAGNOSIS — R309 Painful micturition, unspecified: Secondary | ICD-10-CM

## 2024-06-16 DIAGNOSIS — R300 Dysuria: Secondary | ICD-10-CM

## 2024-06-16 DIAGNOSIS — N390 Urinary tract infection, site not specified: Secondary | ICD-10-CM

## 2024-06-16 LAB — POCT URINALYSIS DIPSTICK
Glucose, UA: NEGATIVE
Ketones, UA: NEGATIVE
Nitrite, UA: NEGATIVE
Protein, UA: NEGATIVE

## 2024-06-16 MED ORDER — SULFAMETHOXAZOLE-TRIMETHOPRIM 800-160 MG PO TABS: 1.0000 | ORAL_TABLET | Freq: Two times a day (BID) | ORAL | 0 refills | Status: AC

## 2024-06-16 NOTE — Progress Notes (Signed)
°  Subjective:     Patient ID: [name], female   DOB: [birth year], 78 y.o.   MRN: [id number]  HPI [name] is a 78 year old black female, widowed, sp hysterectomy, in complaining of pain and burning with urination and urinary frequency for few weeks now.  PCP is Dr Shona.  Review of Systems +pain and burning with urination  + urinary frequency for few weeks now   Reviewed past medical,surgical, social and family history. Reviewed medications and allergies.  Objective:   Physical Exam BP 126/77 (BP Location: Right Arm, Patient Position: Sitting, Cuff Size: Normal)   Pulse 70   Ht 5' 6 (1.676 m)   Wt 168 lb (76.2 kg)   BMI 27.12 kg/m  urine dipstick 2+blood, 3+ leuks Skin warm and dry.  Lungs: clear to ausculation bilaterally. Cardiovascular: regular rate and rhythm. Fall risk is low  Upstream - 06/16/24 1508       Pregnancy Intention Screening   Does the patient want to become pregnant in the next year? N/A    Does the patient's partner want to become pregnant in the next year? N/A    Would the patient like to discuss contraceptive options today? N/A      Contraception Wrap Up   Current Method Hysterectomy    End Method Hysterectomy    Contraception Counseling Provided No              Assessment:     1. Pain with urination +pain when pees Urine sent for UA C&S  - POCT Urinalysis Dipstick - Urine Culture - Urinalysis, Routine w reflex microscopic  2. Burning with urination Urine sent for UA C&S  - POCT Urinalysis Dipstick - Urine Culture - Urinalysis, Routine w reflex microscopic  3. Urinary frequency +UF for few weeks now UA C&S sent - POCT Urinalysis Dipstick - Urine Culture - Urinalysis, Routine w reflex microscopic  4. Urinary tract infection with hematuria, site unspecified (Primary) Will rx septra  ds 1 bid x 7 days Push fluids  Meds ordered this encounter  Medications   sulfamethoxazole -trimethoprim  (BACTRIM  DS) 800-160 MG tablet    Sig: Take  1 tablet by mouth 2 (two) times daily. Take 1 bid    Dispense:  14 tablet    Refill:  0    Supervising Provider:   JAYNE MINDER H [[id number]]   If not better in few days let me know    Plan:     Follow up prn

## 2024-06-17 LAB — MICROSCOPIC EXAMINATION
Casts: NONE SEEN /LPF
Epithelial Cells (non renal): >10 /HPF — AB (ref 0–10)
WBC, UA: >30 /HPF — AB (ref 0–5)

## 2024-06-17 LAB — URINALYSIS, ROUTINE W REFLEX MICROSCOPIC
Bilirubin, UA: NEGATIVE
Glucose, UA: NEGATIVE
Ketones, UA: NEGATIVE
Nitrite, UA: NEGATIVE
RBC, UA: NEGATIVE
Specific Gravity, UA: 1.011 (ref 1.005–1.030)
Urobilinogen, Ur: 0.2 mg/dL (ref 0.2–1.0)
pH, UA: 7 (ref 5.0–7.5)

## 2024-06-19 LAB — URINE CULTURE

## 2024-06-21 ENCOUNTER — Ambulatory Visit: Payer: Self-pay | Admitting: Adult Health

## 2024-06-25 ENCOUNTER — Encounter: Payer: Self-pay | Admitting: Cardiology

## 2024-06-29 ENCOUNTER — Ambulatory Visit (HOSPITAL_COMMUNITY)
Admission: RE | Admit: 2024-06-29 | Discharge: 2024-06-29 | Disposition: A | Discharge status: Home / Self Care | Source: Ambulatory Visit | Attending: Family Medicine | Admitting: Family Medicine

## 2024-06-29 ENCOUNTER — Other Ambulatory Visit (HOSPITAL_COMMUNITY): Payer: Self-pay | Admitting: Family Medicine

## 2024-06-29 DIAGNOSIS — R051 Acute cough: Secondary | ICD-10-CM

## 2024-07-08 ENCOUNTER — Encounter: Payer: Self-pay | Admitting: Gastroenterology

## 2024-07-12 ENCOUNTER — Other Ambulatory Visit (HOSPITAL_COMMUNITY): Payer: Self-pay | Admitting: Orthopedic Surgery

## 2024-07-12 DIAGNOSIS — M545 Low back pain, unspecified: Secondary | ICD-10-CM

## 2024-07-19 ENCOUNTER — Encounter (HOSPITAL_COMMUNITY): Payer: Self-pay

## 2024-07-19 ENCOUNTER — Ambulatory Visit (HOSPITAL_COMMUNITY): Admission: RE | Admit: 2024-07-19 | Payer: Medicare (Managed Care) | Source: Ambulatory Visit

## 2024-07-20 ENCOUNTER — Other Ambulatory Visit (HOSPITAL_COMMUNITY): Payer: Self-pay | Admitting: Orthopedic Surgery

## 2024-07-20 DIAGNOSIS — M545 Low back pain, unspecified: Secondary | ICD-10-CM

## 2024-07-20 DIAGNOSIS — M542 Cervicalgia: Secondary | ICD-10-CM

## 2024-07-27 ENCOUNTER — Encounter (HOSPITAL_COMMUNITY): Payer: Self-pay

## 2024-07-27 ENCOUNTER — Ambulatory Visit (HOSPITAL_COMMUNITY): Admission: RE | Admit: 2024-07-27 | Payer: Medicare (Managed Care) | Source: Ambulatory Visit

## 2024-07-27 ENCOUNTER — Ambulatory Visit (HOSPITAL_COMMUNITY): Payer: Medicare (Managed Care)

## 2024-12-02 ENCOUNTER — Ambulatory Visit: Admitting: Neurology
